# Patient Record
Sex: Female | Born: 1940
Health system: Southern US, Community
[De-identification: ages and names within clinical notes are randomized; demographics above are authoritative.]

## PROBLEM LIST (undated history)

## (undated) DIAGNOSIS — Z973 Presence of spectacles and contact lenses: Secondary | ICD-10-CM

## (undated) DIAGNOSIS — F332 Major depressive disorder, recurrent severe without psychotic features: Principal | ICD-10-CM

## (undated) DIAGNOSIS — C50919 Malignant neoplasm of unspecified site of unspecified female breast: Secondary | ICD-10-CM

## (undated) DIAGNOSIS — I10 Essential (primary) hypertension: Secondary | ICD-10-CM

## (undated) DIAGNOSIS — Z8619 Personal history of other infectious and parasitic diseases: Secondary | ICD-10-CM

## (undated) DIAGNOSIS — I4891 Unspecified atrial fibrillation: Secondary | ICD-10-CM

## (undated) DIAGNOSIS — C801 Malignant (primary) neoplasm, unspecified: Secondary | ICD-10-CM

## (undated) DIAGNOSIS — B9681 Helicobacter pylori [H. pylori] as the cause of diseases classified elsewhere: Secondary | ICD-10-CM

## (undated) DIAGNOSIS — Z923 Personal history of irradiation: Secondary | ICD-10-CM

## (undated) DIAGNOSIS — K2970 Gastritis, unspecified, without bleeding: Secondary | ICD-10-CM

## (undated) DIAGNOSIS — K118 Other diseases of salivary glands: Secondary | ICD-10-CM

## (undated) DIAGNOSIS — Z86010 Personal history of colonic polyps: Secondary | ICD-10-CM

## (undated) HISTORY — DX: Helicobacter pylori (H. pylori) as the cause of diseases classified elsewhere: B96.81

## (undated) HISTORY — DX: Personal history of other infectious and parasitic diseases: Z86.19

## (undated) HISTORY — DX: Gastritis, unspecified, without bleeding: K29.70

## (undated) HISTORY — DX: Essential (primary) hypertension: I10

## (undated) HISTORY — PX: EYE SURGERY: SHX253

## (undated) HISTORY — DX: Other diseases of salivary glands: K11.8

## (undated) HISTORY — PX: COLONOSCOPY: SHX174

## (undated) HISTORY — DX: Major depressive disorder, recurrent severe without psychotic features: F33.2

## (undated) HISTORY — DX: Personal history of colonic polyps: Z86.010

---

## 1980-12-07 HISTORY — PX: ABDOMINAL HYSTERECTOMY: SHX81

## 1998-06-27 ENCOUNTER — Ambulatory Visit (HOSPITAL_COMMUNITY): Admission: RE | Admit: 1998-06-27 | Discharge: 1998-06-27 | Payer: Self-pay | Admitting: Gastroenterology

## 1999-12-08 DIAGNOSIS — B9681 Helicobacter pylori [H. pylori] as the cause of diseases classified elsewhere: Secondary | ICD-10-CM

## 1999-12-08 HISTORY — DX: Helicobacter pylori (H. pylori) as the cause of diseases classified elsewhere: B96.81

## 1999-12-08 HISTORY — PX: ESOPHAGOGASTRODUODENOSCOPY: SHX1529

## 2000-04-30 ENCOUNTER — Encounter: Payer: Self-pay | Admitting: Gastroenterology

## 2000-04-30 ENCOUNTER — Encounter: Admission: RE | Admit: 2000-04-30 | Discharge: 2000-04-30 | Payer: Self-pay | Admitting: Gastroenterology

## 2000-05-10 ENCOUNTER — Encounter (INDEPENDENT_AMBULATORY_CARE_PROVIDER_SITE_OTHER): Payer: Self-pay | Admitting: Specialist

## 2000-05-10 ENCOUNTER — Ambulatory Visit (HOSPITAL_COMMUNITY): Admission: RE | Admit: 2000-05-10 | Discharge: 2000-05-10 | Payer: Self-pay | Admitting: Gastroenterology

## 2002-11-11 ENCOUNTER — Emergency Department (HOSPITAL_COMMUNITY): Admission: EM | Admit: 2002-11-11 | Discharge: 2002-11-11 | Payer: Self-pay

## 2002-11-11 ENCOUNTER — Encounter: Payer: Self-pay | Admitting: Emergency Medicine

## 2002-12-07 HISTORY — PX: BREAST SURGERY: SHX581

## 2003-07-05 ENCOUNTER — Encounter (HOSPITAL_BASED_OUTPATIENT_CLINIC_OR_DEPARTMENT_OTHER): Payer: Self-pay | Admitting: General Surgery

## 2003-07-05 ENCOUNTER — Ambulatory Visit (HOSPITAL_COMMUNITY): Admission: RE | Admit: 2003-07-05 | Discharge: 2003-07-05 | Payer: Self-pay | Admitting: General Surgery

## 2003-07-05 ENCOUNTER — Encounter: Admission: RE | Admit: 2003-07-05 | Discharge: 2003-07-05 | Payer: Self-pay | Admitting: General Surgery

## 2003-07-17 ENCOUNTER — Encounter (HOSPITAL_BASED_OUTPATIENT_CLINIC_OR_DEPARTMENT_OTHER): Payer: Self-pay | Admitting: General Surgery

## 2003-07-19 ENCOUNTER — Encounter (INDEPENDENT_AMBULATORY_CARE_PROVIDER_SITE_OTHER): Payer: Self-pay | Admitting: Specialist

## 2003-07-19 ENCOUNTER — Ambulatory Visit (HOSPITAL_COMMUNITY): Admission: RE | Admit: 2003-07-19 | Discharge: 2003-07-19 | Payer: Self-pay | Admitting: General Surgery

## 2003-12-08 HISTORY — PX: BREAST LUMPECTOMY WITH SENTINEL LYMPH NODE BIOPSY: SHX5597

## 2003-12-08 HISTORY — PX: RE-EXCISION OF BREAST LUMPECTOMY: SHX6048

## 2004-05-25 ENCOUNTER — Emergency Department (HOSPITAL_COMMUNITY): Admission: EM | Admit: 2004-05-25 | Discharge: 2004-05-25 | Payer: Self-pay | Admitting: Emergency Medicine

## 2004-09-01 ENCOUNTER — Ambulatory Visit (HOSPITAL_COMMUNITY): Payer: Self-pay | Admitting: Psychiatry

## 2004-09-08 ENCOUNTER — Ambulatory Visit (HOSPITAL_COMMUNITY): Payer: Self-pay | Admitting: Professional Counselor

## 2004-09-15 ENCOUNTER — Ambulatory Visit (HOSPITAL_COMMUNITY): Payer: Self-pay | Admitting: Psychiatry

## 2004-09-22 ENCOUNTER — Ambulatory Visit (HOSPITAL_COMMUNITY): Payer: Self-pay | Admitting: Professional Counselor

## 2004-10-10 ENCOUNTER — Ambulatory Visit (HOSPITAL_COMMUNITY): Payer: Self-pay | Admitting: Psychiatry

## 2004-10-13 ENCOUNTER — Encounter: Admission: RE | Admit: 2004-10-13 | Discharge: 2004-10-13 | Payer: Self-pay | Admitting: General Surgery

## 2004-10-13 ENCOUNTER — Encounter (INDEPENDENT_AMBULATORY_CARE_PROVIDER_SITE_OTHER): Payer: Self-pay | Admitting: *Deleted

## 2004-10-13 ENCOUNTER — Encounter (INDEPENDENT_AMBULATORY_CARE_PROVIDER_SITE_OTHER): Payer: Self-pay | Admitting: Diagnostic Radiology

## 2004-10-16 ENCOUNTER — Encounter: Admission: RE | Admit: 2004-10-16 | Discharge: 2004-10-16 | Payer: Self-pay | Admitting: General Surgery

## 2004-10-20 ENCOUNTER — Ambulatory Visit (HOSPITAL_COMMUNITY): Payer: Self-pay | Admitting: Professional Counselor

## 2004-10-21 ENCOUNTER — Encounter: Admission: RE | Admit: 2004-10-21 | Discharge: 2004-10-21 | Payer: Self-pay | Admitting: General Surgery

## 2004-11-06 ENCOUNTER — Encounter: Admission: RE | Admit: 2004-11-06 | Discharge: 2004-11-06 | Payer: Self-pay | Admitting: General Surgery

## 2004-11-12 ENCOUNTER — Ambulatory Visit (HOSPITAL_COMMUNITY): Admission: RE | Admit: 2004-11-12 | Discharge: 2004-11-12 | Payer: Self-pay | Admitting: General Surgery

## 2004-11-12 ENCOUNTER — Ambulatory Visit (HOSPITAL_BASED_OUTPATIENT_CLINIC_OR_DEPARTMENT_OTHER): Admission: RE | Admit: 2004-11-12 | Discharge: 2004-11-12 | Payer: Self-pay | Admitting: General Surgery

## 2004-11-12 ENCOUNTER — Encounter (INDEPENDENT_AMBULATORY_CARE_PROVIDER_SITE_OTHER): Payer: Self-pay | Admitting: Specialist

## 2004-11-12 ENCOUNTER — Encounter: Admission: RE | Admit: 2004-11-12 | Discharge: 2004-11-12 | Payer: Self-pay | Admitting: General Surgery

## 2004-12-05 ENCOUNTER — Encounter (INDEPENDENT_AMBULATORY_CARE_PROVIDER_SITE_OTHER): Payer: Self-pay | Admitting: Specialist

## 2004-12-05 ENCOUNTER — Ambulatory Visit (HOSPITAL_COMMUNITY): Admission: RE | Admit: 2004-12-05 | Discharge: 2004-12-05 | Payer: Self-pay | Admitting: General Surgery

## 2004-12-05 ENCOUNTER — Ambulatory Visit (HOSPITAL_BASED_OUTPATIENT_CLINIC_OR_DEPARTMENT_OTHER): Admission: RE | Admit: 2004-12-05 | Discharge: 2004-12-05 | Payer: Self-pay | Admitting: General Surgery

## 2004-12-07 DIAGNOSIS — C50919 Malignant neoplasm of unspecified site of unspecified female breast: Secondary | ICD-10-CM

## 2004-12-07 DIAGNOSIS — Z923 Personal history of irradiation: Secondary | ICD-10-CM

## 2004-12-07 HISTORY — DX: Malignant neoplasm of unspecified site of unspecified female breast: C50.919

## 2004-12-07 HISTORY — DX: Personal history of irradiation: Z92.3

## 2004-12-11 ENCOUNTER — Ambulatory Visit: Payer: Self-pay | Admitting: Oncology

## 2004-12-19 ENCOUNTER — Ambulatory Visit: Admission: RE | Admit: 2004-12-19 | Discharge: 2005-03-13 | Payer: Self-pay | Admitting: *Deleted

## 2005-02-16 ENCOUNTER — Ambulatory Visit: Payer: Self-pay | Admitting: Oncology

## 2005-02-18 ENCOUNTER — Ambulatory Visit (HOSPITAL_COMMUNITY): Payer: Self-pay | Admitting: Psychiatry

## 2005-02-27 ENCOUNTER — Ambulatory Visit (HOSPITAL_COMMUNITY): Admission: RE | Admit: 2005-02-27 | Discharge: 2005-02-27 | Payer: Self-pay | Admitting: Oncology

## 2005-03-18 ENCOUNTER — Ambulatory Visit (HOSPITAL_COMMUNITY): Payer: Self-pay | Admitting: Psychiatry

## 2005-03-23 ENCOUNTER — Ambulatory Visit (HOSPITAL_COMMUNITY): Admission: RE | Admit: 2005-03-23 | Discharge: 2005-03-23 | Payer: Self-pay | Admitting: Oncology

## 2005-03-24 ENCOUNTER — Ambulatory Visit: Admission: RE | Admit: 2005-03-24 | Discharge: 2005-03-24 | Payer: Self-pay | Admitting: *Deleted

## 2005-03-27 ENCOUNTER — Ambulatory Visit (HOSPITAL_COMMUNITY): Admission: RE | Admit: 2005-03-27 | Discharge: 2005-03-27 | Payer: Self-pay | Admitting: Family Medicine

## 2005-03-27 ENCOUNTER — Emergency Department (HOSPITAL_COMMUNITY): Admission: EM | Admit: 2005-03-27 | Discharge: 2005-03-27 | Payer: Self-pay | Admitting: Family Medicine

## 2005-04-06 ENCOUNTER — Ambulatory Visit: Payer: Self-pay | Admitting: Oncology

## 2005-05-20 ENCOUNTER — Ambulatory Visit (HOSPITAL_COMMUNITY): Payer: Self-pay | Admitting: Psychiatry

## 2005-08-12 ENCOUNTER — Ambulatory Visit (HOSPITAL_COMMUNITY): Payer: Self-pay | Admitting: Psychiatry

## 2005-09-11 ENCOUNTER — Ambulatory Visit: Payer: Self-pay | Admitting: Oncology

## 2005-10-14 ENCOUNTER — Ambulatory Visit (HOSPITAL_COMMUNITY): Payer: Self-pay | Admitting: Psychiatry

## 2005-12-07 HISTORY — PX: BREAST LUMPECTOMY: SHX2

## 2006-01-13 ENCOUNTER — Ambulatory Visit (HOSPITAL_COMMUNITY): Payer: Self-pay | Admitting: Psychiatry

## 2006-02-16 ENCOUNTER — Ambulatory Visit: Payer: Self-pay | Admitting: Oncology

## 2006-04-20 ENCOUNTER — Emergency Department (HOSPITAL_COMMUNITY): Admission: EM | Admit: 2006-04-20 | Discharge: 2006-04-20 | Payer: Self-pay | Admitting: Family Medicine

## 2006-05-06 ENCOUNTER — Emergency Department (HOSPITAL_COMMUNITY): Admission: EM | Admit: 2006-05-06 | Discharge: 2006-05-06 | Payer: Self-pay | Admitting: Emergency Medicine

## 2006-05-24 ENCOUNTER — Ambulatory Visit (HOSPITAL_COMMUNITY): Payer: Self-pay | Admitting: Psychiatry

## 2006-06-08 ENCOUNTER — Emergency Department (HOSPITAL_COMMUNITY): Admission: EM | Admit: 2006-06-08 | Discharge: 2006-06-08 | Payer: Self-pay | Admitting: Family Medicine

## 2006-06-15 ENCOUNTER — Encounter: Admission: RE | Admit: 2006-06-15 | Discharge: 2006-06-15 | Payer: Self-pay | Admitting: Oncology

## 2006-06-16 ENCOUNTER — Ambulatory Visit (HOSPITAL_COMMUNITY): Payer: Self-pay | Admitting: Psychiatry

## 2006-08-16 ENCOUNTER — Ambulatory Visit: Payer: Self-pay | Admitting: Oncology

## 2006-08-18 LAB — COMPREHENSIVE METABOLIC PANEL
Albumin: 4 g/dL (ref 3.5–5.2)
CO2: 27 mEq/L (ref 19–32)
Chloride: 107 mEq/L (ref 96–112)
Glucose, Bld: 111 mg/dL — ABNORMAL HIGH (ref 70–99)
Potassium: 3.6 mEq/L (ref 3.5–5.3)
Sodium: 142 mEq/L (ref 135–145)
Total Protein: 7 g/dL (ref 6.0–8.3)

## 2006-08-18 LAB — CBC WITH DIFFERENTIAL/PLATELET
Eosinophils Absolute: 0 10*3/uL (ref 0.0–0.5)
LYMPH%: 36.1 % (ref 14.0–48.0)
MONO#: 0.4 10*3/uL (ref 0.1–0.9)
NEUT#: 2.5 10*3/uL (ref 1.5–6.5)
Platelets: 305 10*3/uL (ref 145–400)
RBC: 4.29 10*6/uL (ref 3.70–5.32)
RDW: 13.3 % (ref 11.3–14.5)
WBC: 4.6 10*3/uL (ref 3.9–10.0)

## 2006-08-19 ENCOUNTER — Encounter: Admission: RE | Admit: 2006-08-19 | Discharge: 2006-08-19 | Payer: Self-pay | Admitting: Oncology

## 2006-09-10 ENCOUNTER — Ambulatory Visit (HOSPITAL_COMMUNITY): Payer: Self-pay | Admitting: Psychiatry

## 2006-12-29 ENCOUNTER — Ambulatory Visit (HOSPITAL_COMMUNITY): Payer: Self-pay | Admitting: Psychiatry

## 2007-02-09 ENCOUNTER — Emergency Department (HOSPITAL_COMMUNITY): Admission: EM | Admit: 2007-02-09 | Discharge: 2007-02-09 | Payer: Self-pay | Admitting: Emergency Medicine

## 2007-02-10 ENCOUNTER — Ambulatory Visit: Payer: Self-pay | Admitting: Oncology

## 2007-02-22 LAB — CBC WITH DIFFERENTIAL/PLATELET
Basophils Absolute: 0 10*3/uL (ref 0.0–0.1)
EOS%: 0.7 % (ref 0.0–7.0)
HCT: 37.6 % (ref 34.8–46.6)
HGB: 13 g/dL (ref 11.6–15.9)
LYMPH%: 34.6 % (ref 14.0–48.0)
MCH: 29.3 pg (ref 26.0–34.0)
MCV: 84.4 fL (ref 81.0–101.0)
MONO%: 8.9 % (ref 0.0–13.0)
NEUT%: 55.2 % (ref 39.6–76.8)
Platelets: 315 10*3/uL (ref 145–400)
RDW: 13.4 % (ref 11.3–14.5)

## 2007-02-22 LAB — COMPREHENSIVE METABOLIC PANEL
AST: 13 U/L (ref 0–37)
Alkaline Phosphatase: 76 U/L (ref 39–117)
BUN: 10 mg/dL (ref 6–23)
Creatinine, Ser: 0.85 mg/dL (ref 0.40–1.20)

## 2007-03-28 ENCOUNTER — Ambulatory Visit (HOSPITAL_COMMUNITY): Payer: Self-pay | Admitting: Psychiatry

## 2007-06-22 ENCOUNTER — Encounter: Admission: RE | Admit: 2007-06-22 | Discharge: 2007-06-22 | Payer: Self-pay | Admitting: Oncology

## 2007-07-06 ENCOUNTER — Ambulatory Visit (HOSPITAL_COMMUNITY): Payer: Self-pay | Admitting: Psychiatry

## 2007-08-03 ENCOUNTER — Ambulatory Visit (HOSPITAL_COMMUNITY): Payer: Self-pay | Admitting: Psychiatry

## 2007-08-19 ENCOUNTER — Ambulatory Visit: Payer: Self-pay | Admitting: Oncology

## 2007-09-15 LAB — CBC WITH DIFFERENTIAL/PLATELET
Basophils Absolute: 0.1 10*3/uL (ref 0.0–0.1)
EOS%: 0.9 % (ref 0.0–7.0)
Eosinophils Absolute: 0.1 10*3/uL (ref 0.0–0.5)
HCT: 37.2 % (ref 34.8–46.6)
HGB: 13.2 g/dL (ref 11.6–15.9)
MCH: 29.4 pg (ref 26.0–34.0)
MCV: 83.2 fL (ref 81.0–101.0)
MONO%: 6.4 % (ref 0.0–13.0)
NEUT#: 3.9 10*3/uL (ref 1.5–6.5)
NEUT%: 58.6 % (ref 39.6–76.8)
lymph#: 2.2 10*3/uL (ref 0.9–3.3)

## 2007-09-15 LAB — COMPREHENSIVE METABOLIC PANEL
AST: 14 U/L (ref 0–37)
Albumin: 4.1 g/dL (ref 3.5–5.2)
BUN: 10 mg/dL (ref 6–23)
Calcium: 9.6 mg/dL (ref 8.4–10.5)
Chloride: 107 mEq/L (ref 96–112)
Creatinine, Ser: 0.82 mg/dL (ref 0.40–1.20)
Glucose, Bld: 122 mg/dL — ABNORMAL HIGH (ref 70–99)

## 2007-10-24 ENCOUNTER — Ambulatory Visit (HOSPITAL_COMMUNITY): Payer: Self-pay | Admitting: Psychiatry

## 2008-01-06 ENCOUNTER — Ambulatory Visit (HOSPITAL_COMMUNITY): Payer: Self-pay | Admitting: Psychiatry

## 2008-03-05 ENCOUNTER — Ambulatory Visit: Payer: Self-pay | Admitting: Oncology

## 2008-03-13 LAB — CBC WITH DIFFERENTIAL/PLATELET
Basophils Absolute: 0 10*3/uL (ref 0.0–0.1)
EOS%: 1.5 % (ref 0.0–7.0)
HCT: 38.1 % (ref 34.8–46.6)
HGB: 13.4 g/dL (ref 11.6–15.9)
LYMPH%: 41.4 % (ref 14.0–48.0)
MCH: 29.2 pg (ref 26.0–34.0)
MCV: 83.3 fL (ref 81.0–101.0)
MONO%: 8.7 % (ref 0.0–13.0)
NEUT%: 48.3 % (ref 39.6–76.8)
Platelets: 339 10*3/uL (ref 145–400)
lymph#: 2.7 10*3/uL (ref 0.9–3.3)

## 2008-03-13 LAB — COMPREHENSIVE METABOLIC PANEL
AST: 12 U/L (ref 0–37)
BUN: 14 mg/dL (ref 6–23)
Calcium: 9.8 mg/dL (ref 8.4–10.5)
Chloride: 108 mEq/L (ref 96–112)
Creatinine, Ser: 0.79 mg/dL (ref 0.40–1.20)

## 2008-06-22 ENCOUNTER — Encounter: Admission: RE | Admit: 2008-06-22 | Discharge: 2008-06-22 | Payer: Self-pay | Admitting: Oncology

## 2008-08-21 ENCOUNTER — Encounter: Admission: RE | Admit: 2008-08-21 | Discharge: 2008-08-21 | Payer: Self-pay | Admitting: Oncology

## 2008-09-10 ENCOUNTER — Ambulatory Visit: Payer: Self-pay | Admitting: Oncology

## 2008-09-14 LAB — CBC WITH DIFFERENTIAL/PLATELET
BASO%: 0.4 % (ref 0.0–2.0)
Basophils Absolute: 0 10*3/uL (ref 0.0–0.1)
EOS%: 0.8 % (ref 0.0–7.0)
HCT: 36.5 % (ref 34.8–46.6)
HGB: 12.7 g/dL (ref 11.6–15.9)
LYMPH%: 38.9 % (ref 14.0–48.0)
MCH: 29.3 pg (ref 26.0–34.0)
MCHC: 34.8 g/dL (ref 32.0–36.0)
MCV: 84.3 fL (ref 81.0–101.0)
MONO%: 10.2 % (ref 0.0–13.0)
NEUT%: 49.7 % (ref 39.6–76.8)
Platelets: 288 10*3/uL (ref 145–400)

## 2008-09-17 LAB — COMPREHENSIVE METABOLIC PANEL
ALT: 8 U/L (ref 0–35)
AST: 15 U/L (ref 0–37)
BUN: 15 mg/dL (ref 6–23)
Creatinine, Ser: 0.99 mg/dL (ref 0.40–1.20)
Total Bilirubin: 0.4 mg/dL (ref 0.3–1.2)

## 2008-12-24 ENCOUNTER — Ambulatory Visit (HOSPITAL_COMMUNITY): Payer: Self-pay | Admitting: Psychiatry

## 2009-03-12 ENCOUNTER — Emergency Department (HOSPITAL_COMMUNITY): Admission: EM | Admit: 2009-03-12 | Discharge: 2009-03-12 | Payer: Self-pay | Admitting: Emergency Medicine

## 2009-03-12 ENCOUNTER — Ambulatory Visit: Payer: Self-pay | Admitting: Oncology

## 2009-03-21 LAB — CBC WITH DIFFERENTIAL/PLATELET
Basophils Absolute: 0.1 10*3/uL (ref 0.0–0.1)
Eosinophils Absolute: 0.1 10*3/uL (ref 0.0–0.5)
HCT: 38.8 % (ref 34.8–46.6)
HGB: 13 g/dL (ref 11.6–15.9)
LYMPH%: 37.3 % (ref 14.0–49.7)
MONO#: 0.3 10*3/uL (ref 0.1–0.9)
NEUT#: 2.4 10*3/uL (ref 1.5–6.5)
NEUT%: 52.6 % (ref 38.4–76.8)
Platelets: 265 10*3/uL (ref 145–400)
WBC: 4.6 10*3/uL (ref 3.9–10.3)
nRBC: 0 % (ref 0–0)

## 2009-03-22 LAB — VITAMIN D 25 HYDROXY (VIT D DEFICIENCY, FRACTURES): Vit D, 25-Hydroxy: 17 ng/mL — ABNORMAL LOW (ref 30–89)

## 2009-03-22 LAB — COMPREHENSIVE METABOLIC PANEL
ALT: <8 U/L (ref 0–35)
BUN: 14 mg/dL (ref 6–23)
CO2: 25 mEq/L (ref 19–32)
Calcium: 10 mg/dL (ref 8.4–10.5)
Chloride: 108 mEq/L (ref 96–112)
Creatinine, Ser: 0.89 mg/dL (ref 0.40–1.20)
Glucose, Bld: 125 mg/dL — ABNORMAL HIGH (ref 70–99)

## 2009-03-22 LAB — CANCER ANTIGEN 27.29: CA 27.29: 39 U/mL (ref 0–39)

## 2009-05-10 ENCOUNTER — Ambulatory Visit (HOSPITAL_COMMUNITY): Payer: Self-pay | Admitting: Psychiatry

## 2009-07-04 ENCOUNTER — Encounter: Admission: RE | Admit: 2009-07-04 | Discharge: 2009-07-04 | Payer: Self-pay | Admitting: Internal Medicine

## 2009-09-24 ENCOUNTER — Ambulatory Visit: Payer: Self-pay | Admitting: Oncology

## 2010-06-08 IMAGING — CR DG WRIST COMPLETE 3+V*L*
4 series · 4 of 4 positions shown · non-contrast
Comparison: None

CLINICAL DATA: Pain post fall

LEFT WRIST - COMPLETE 3+ VIEW

[view not recorded (1 of 4)]
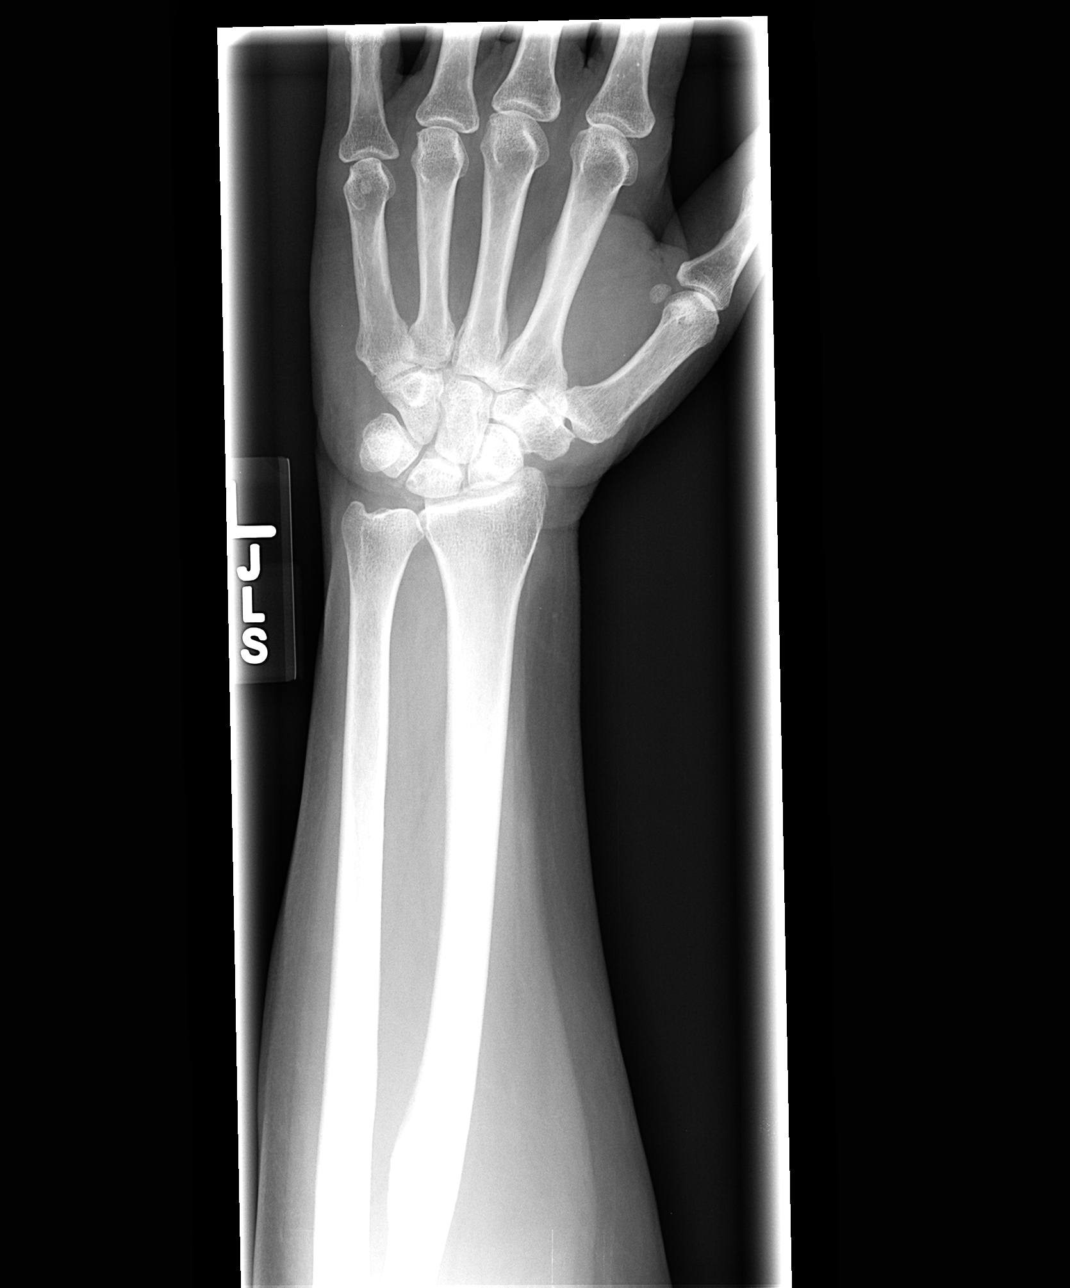

[view not recorded (2 of 4)]
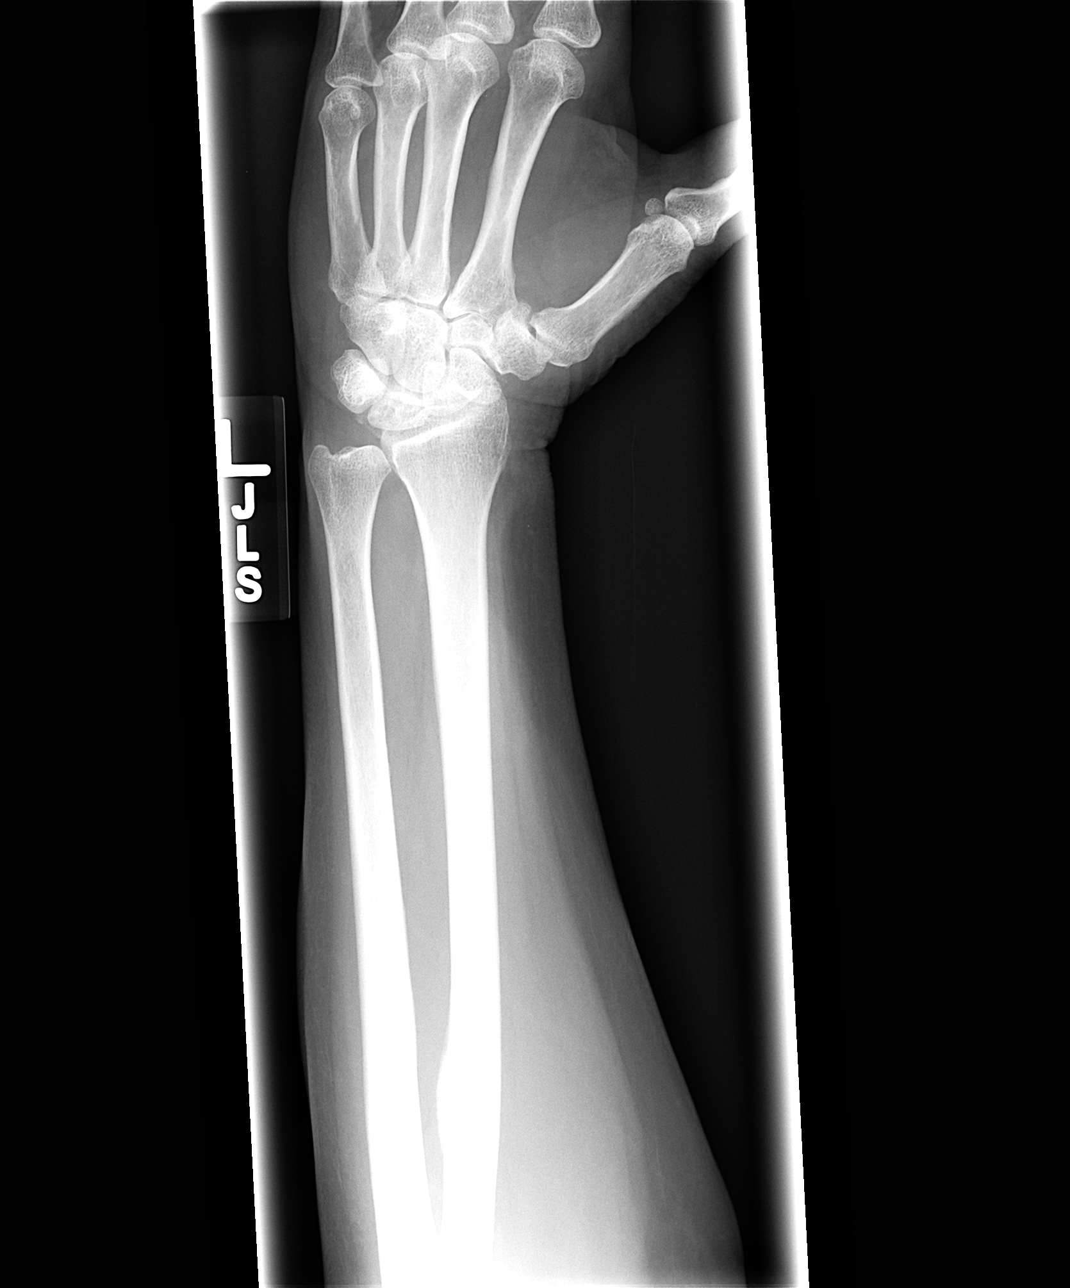

[view not recorded (3 of 4)]
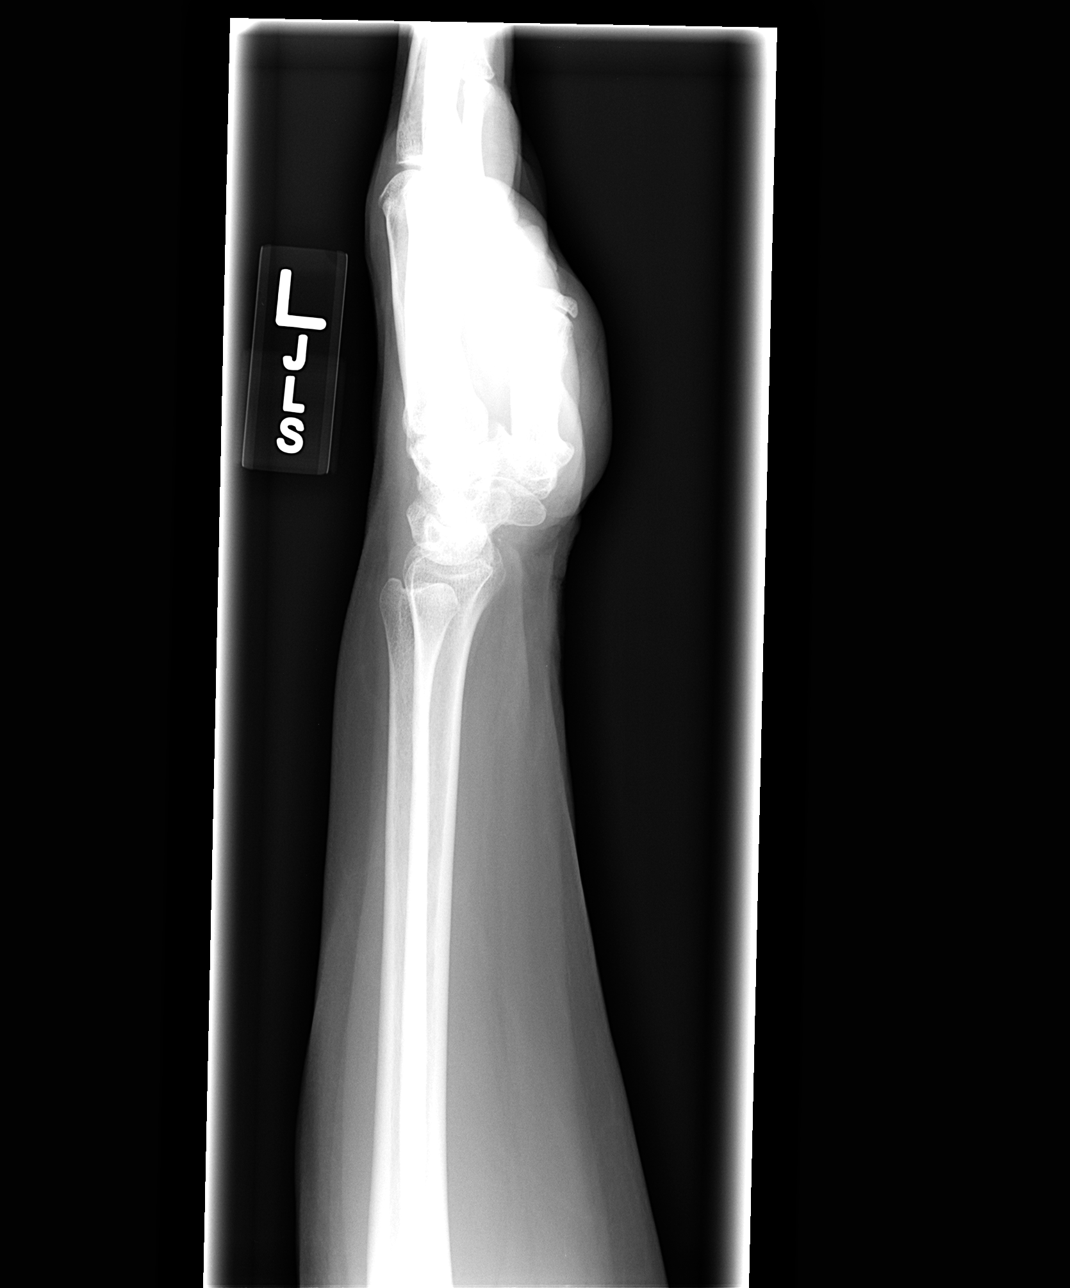

[view not recorded (4 of 4)]
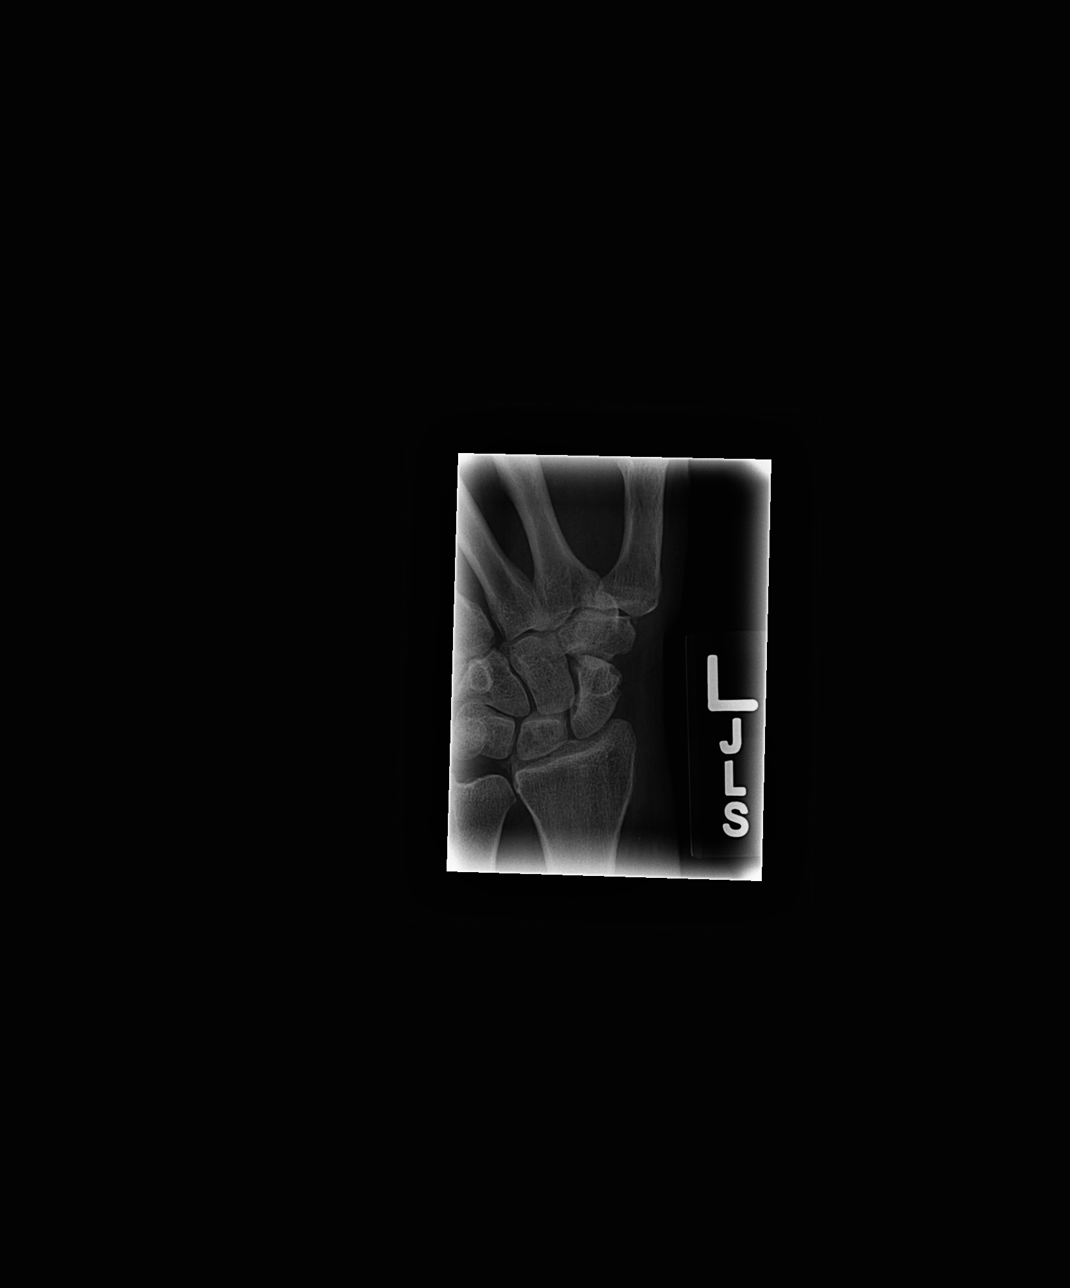

[4 of 4 positions shown; findings below may reference images not displayed]

FINDINGS: Chondrocalcinosis is noted the radiocarpal compartment.
Carpal rows intact.  Small degenerative cysts or geodes in the
lunate.  Negative for fracture, dislocation, or other acute bony
abnormality.
IMPRESSION: 1.  Negative for fracture or other acute abnormality.
2.  Chondrocalcinosis suggesting CPPD.

## 2010-07-07 ENCOUNTER — Encounter: Admission: RE | Admit: 2010-07-07 | Discharge: 2010-07-07 | Payer: Self-pay | Admitting: Internal Medicine

## 2010-07-16 ENCOUNTER — Ambulatory Visit (HOSPITAL_COMMUNITY): Payer: Self-pay | Admitting: Psychiatry

## 2010-07-17 ENCOUNTER — Ambulatory Visit (HOSPITAL_COMMUNITY): Payer: Self-pay | Admitting: Licensed Clinical Social Worker

## 2010-08-21 ENCOUNTER — Ambulatory Visit (HOSPITAL_COMMUNITY): Payer: Self-pay | Admitting: Licensed Clinical Social Worker

## 2010-09-24 ENCOUNTER — Emergency Department (HOSPITAL_COMMUNITY): Admission: EM | Admit: 2010-09-24 | Discharge: 2010-09-24 | Payer: Self-pay | Admitting: Family Medicine

## 2010-10-10 ENCOUNTER — Ambulatory Visit (HOSPITAL_COMMUNITY): Payer: Self-pay | Admitting: Licensed Clinical Social Worker

## 2010-10-17 ENCOUNTER — Ambulatory Visit (HOSPITAL_COMMUNITY): Payer: Self-pay | Admitting: Licensed Clinical Social Worker

## 2010-10-20 ENCOUNTER — Ambulatory Visit (HOSPITAL_COMMUNITY): Payer: Self-pay | Admitting: Licensed Clinical Social Worker

## 2010-10-28 ENCOUNTER — Ambulatory Visit (HOSPITAL_COMMUNITY): Payer: Self-pay | Admitting: Licensed Clinical Social Worker

## 2010-11-03 ENCOUNTER — Ambulatory Visit (HOSPITAL_COMMUNITY): Payer: Self-pay | Admitting: Psychiatry

## 2010-11-12 ENCOUNTER — Ambulatory Visit (HOSPITAL_COMMUNITY): Payer: Self-pay | Admitting: Licensed Clinical Social Worker

## 2010-12-27 ENCOUNTER — Encounter (HOSPITAL_BASED_OUTPATIENT_CLINIC_OR_DEPARTMENT_OTHER): Payer: Self-pay | Admitting: General Surgery

## 2010-12-28 ENCOUNTER — Encounter: Payer: Self-pay | Admitting: Oncology

## 2010-12-29 ENCOUNTER — Encounter: Payer: Self-pay | Admitting: Oncology

## 2011-04-24 NOTE — Op Note (Signed)
[name], Erin Cunningham                ACCOUNT NO.:  [id number]   MEDICAL RECORD NO.:  [id number]          PATIENT TYPE:  AMB   LOCATION:  DSC                          FACILITY:  [facility]   PHYSICIAN:  Leonie Man, M.D.   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   PROCEDURE:  Reexcision of right breast ductal carcinoma in situ.   SURGEON:  Leonie Man, M.D.   ASSISTANT:  Nurse.   ANESTHESIA:  General.   NOTE:  The patient is a 70 year old woman with the diagnosed DCIS who  underwent excisional biopsy/partial mastectomy of the right breast on  11/12/2004 for her DCIS.  The pathology shows that the tumor extends to the  anterior margin.  She returns now for reexcision of this area.  The risks  and potential benefits of surgery have been discussed with the patient and  her spouse. All questions answered and consent obtained.   PROCEDURE:  Following the induction of induction of general anesthesia the  patient is positioned supinely, and the right breast is prepped and draped  to be included in a sterile operative field.  An elliptical incision is made  over the old scar cicatrix and flaps were extended anteriorly and laterally  carrying the dissection down to include the entire anterior, superior, and  inferior margins of the excision.  The posterior portion of the tumor cavity  was down to muscle and that was not reexcised.  This specimen was removed  and forwarded for pathologic evaluation.  Pathology is pending.   Hemostasis was then assured with electrocautery.  All areas of dissection  checked for hemostasis and then noted to be dry.  Sponge, instruments,  and  sharp counts verified.  Subcutaneous tissue is reapproximated using  interrupted 3-0 Vicryl sutures.  The skin is closed with a running 4-0  Monocryl suture and then reenforced with Steri-Strips.  I injected  approximately 30 mL of air into the cavity to maintain the contour of the  breast.  I also injected 17 mL of  Marcaine with epinephrine for additional analgesia.  Sterile dressing was  then placed on the wound.  The anesthetic reversed.  The patient removed  from the operating room to the recovery room in stable condition.  She  tolerated the procedure well.      Patr   PB/MEDQ  D:  12/05/2004  T:  12/05/2004  Job:  [id number]   cc:   Leonie Man, M.D.  [address]  [address]  [place]  [place] [address]  Fax: [phone]

## 2011-04-24 NOTE — Op Note (Signed)
   NAME:  Erin Cunningham, Erin Cunningham                          ACCOUNT NO.:  [id number]   MEDICAL RECORD NO.:  [id number]                   PATIENT TYPE:  AMB   LOCATION:  DAY                                  FACILITY:  [facility]   PHYSICIAN:  Leonie Man, M.D.                DATE OF BIRTH:  07-Nov-1941   DATE OF PROCEDURE:  07/19/2003  DATE OF DISCHARGE:                                 OPERATIVE REPORT   PREOPERATIVE DIAGNOSIS:  Right breast mass, rule out carcinoma.   POSTOPERATIVE DIAGNOSIS:  Right breast mass, rule out carcinoma, pathology  pending.   PROCEDURE:  Excisional biopsy of right breast mass.   SURGEON:  Mardene Celeste. Lurene Shadow, M.D.   ASSISTANT:  Nurse.   ANESTHESIA:  General.   NOTE:  This patient is a 70 year old woman, who presents with an enlarging  mass in the right subareolar region, located at the upper inner quadrant of  the areolar which on mammogram is BIRADS 4 showing a suspicion lesion to be  a papilloma.  The patient comes to the operating room now for excision of  this mass.  The risks and potential benefits of surgery have been fully  discussed, all questions answered, and consent obtained.   DESCRIPTION OF PROCEDURE:  Following the induction of satisfactory general  anesthesia, the patient was positioned supinely, and the right breast is  prepped and draped to be included in a sterile operative field.  A  circumareolar incision is carried down on the upper inner border of the  right breast, that is deepened through the skin and subcutaneous tissue,  carrying the dissection under the nipple area to the region of the mass.  The mass is dissected free and removed in its entirety, and it is forwarded  for pathology evaluation.  Hemostasis obtained with electrocautery, and  sponge and instrument counts are verified.  Breast tissues are then  reapproximated with interrupted 3-0 Vicryl sutures as is the subcutaneous  fat.  The skin is closed with a running 5-0 Monocryl  suture and then  reinforced with Steri-Strips.  Sterile compressive dressing is applied, the  anesthetic reversed, and the patient removed from the operating room to the  recovery room in stable condition.  She tolerated the procedure well.                                               Leonie Man, M.D.    PB/MEDQ  D:  07/19/2003  T:  07/19/2003  Job:  [id number]   cc:   Dr. Lurene Shadow (2 copies)

## 2011-04-24 NOTE — Op Note (Signed)
Erin Cunningham, Erin Cunningham                ACCOUNT NO.:  [id number]   MEDICAL RECORD NO.:  [id number]          PATIENT TYPE:  AMB   LOCATION:  DSC                          FACILITY:  [facility]   PHYSICIAN:  Leonie Man, M.D.   DATE OF BIRTH:  1941/09/14   DATE OF PROCEDURE:  11/12/2004  DATE OF DISCHARGE:                                 OPERATIVE REPORT   PREOPERATIVE DIAGNOSIS:  Ductal carcinoma in situ, right breast.   POSTOPERATIVE DIAGNOSIS:  Ductal carcinoma in situ, right breast, pathology  pending.   PROCEDURE:  Right breast lumpectomy following needle localization.   SURGEON:  Leonie Man, M.D.   ASSISTANT:  Nurse.   ANESTHESIA:  General anesthesia.   NOTE:  [name] is a 70 year old woman, on screening mammogram noted to  have an area of microcalcifications in the right breast at approximately the  6 o'clock axis.  This was core biopsied and noted to have DCIS.  MRIs  actually showed that she had an additional lesion, but this was not seen on  ultrasound.  On repeat MRI the lesion had cleaned up and thought then to be  a hematoma from her previous biopsy site.  The patient comes to the  operating room now for a lumpectomy on this breast.  She understands that  invasive carcinoma may be found and that she would require additional  surgery for this.  She has undergone needle localization under ultrasonic  guidance.  The risks and potential benefits of surgery have been fully  discussed with her, all questions answered, and consent obtained.   PROCEDURE:  Following the induction of satisfactory general anesthesia, the  patient is positioned supinely and the right breast is prepped and draped to  be included in the sterile operative field.  An elliptical incision is made  around the localizing needle and dissection carried down into the large  mass, raising flaps superiorly, inferiorly, and laterally, so as to get a  large wedge of breast tissue.  This was taken down all  the way to the chest  wall.  The entire mass was removed and forwarded for radiologic evaluation.  Specimen radiography shows that the mass was entirely contained within the  specimen.  Hemostasis was then obtained with electrocautery.  Sponge and  instrument counts were verified.  The subcutaneous tissue was closed with a  running 2-0 Vicryl suture, skin was closed with a running 4-0 Monocryl  suture and then reinforced with Steri-Strips, sterile dressings applied,  anesthetic reversed, the patient removed from the operating room to the  recovery room in stable condition.  She tolerated the procedure well.      Patr   PB/MEDQ  D:  11/12/2004  T:  11/13/2004  Job:  [id number]

## 2011-06-15 ENCOUNTER — Other Ambulatory Visit: Payer: Self-pay | Admitting: Internal Medicine

## 2011-06-15 DIAGNOSIS — Z853 Personal history of malignant neoplasm of breast: Secondary | ICD-10-CM

## 2011-06-15 DIAGNOSIS — Z9889 Other specified postprocedural states: Secondary | ICD-10-CM

## 2011-07-17 ENCOUNTER — Ambulatory Visit
Admission: RE | Admit: 2011-07-17 | Discharge: 2011-07-17 | Disposition: A | Discharge status: Home / Self Care | Payer: 59 | Source: Ambulatory Visit | Attending: Internal Medicine | Admitting: Internal Medicine

## 2011-07-17 DIAGNOSIS — Z853 Personal history of malignant neoplasm of breast: Secondary | ICD-10-CM

## 2011-07-17 DIAGNOSIS — Z9889 Other specified postprocedural states: Secondary | ICD-10-CM

## 2012-05-04 ENCOUNTER — Telehealth: Payer: Self-pay | Admitting: Oncology

## 2012-05-04 NOTE — Telephone Encounter (Signed)
05/04/12 - Spoke with Ms. Erin Cunningham on the phone for a follow-up visit for the NSABP B-35 study.  She states she is doing wonderful. No problems. No hospitalizations, no fractures or broken bones, no new medications. She has her yearly mammograms and sees her primary care MD yearly or as needed.

## 2012-06-14 ENCOUNTER — Other Ambulatory Visit: Payer: Self-pay | Admitting: Internal Medicine

## 2012-06-14 DIAGNOSIS — Z853 Personal history of malignant neoplasm of breast: Secondary | ICD-10-CM

## 2012-06-14 DIAGNOSIS — Z9889 Other specified postprocedural states: Secondary | ICD-10-CM

## 2012-08-25 ENCOUNTER — Ambulatory Visit
Admission: RE | Admit: 2012-08-25 | Discharge: 2012-08-25 | Disposition: A | Discharge status: Home / Self Care | Payer: Medicare Other | Source: Ambulatory Visit | Attending: Internal Medicine | Admitting: Internal Medicine

## 2012-08-25 ENCOUNTER — Other Ambulatory Visit: Payer: Self-pay | Admitting: Internal Medicine

## 2012-08-25 DIAGNOSIS — Z853 Personal history of malignant neoplasm of breast: Secondary | ICD-10-CM

## 2012-08-25 DIAGNOSIS — Z9889 Other specified postprocedural states: Secondary | ICD-10-CM

## 2012-12-07 DIAGNOSIS — K118 Other diseases of salivary glands: Secondary | ICD-10-CM

## 2012-12-07 HISTORY — DX: Other diseases of salivary glands: K11.8

## 2013-03-30 ENCOUNTER — Emergency Department (HOSPITAL_COMMUNITY): Payer: Medicare Other

## 2013-03-30 ENCOUNTER — Emergency Department (HOSPITAL_COMMUNITY)
Admission: EM | Admit: 2013-03-30 | Discharge: 2013-03-30 | Disposition: A | Discharge status: Home / Self Care | Payer: Medicare Other | Attending: Emergency Medicine | Admitting: Emergency Medicine

## 2013-03-30 ENCOUNTER — Encounter (HOSPITAL_COMMUNITY): Payer: Self-pay | Admitting: Emergency Medicine

## 2013-03-30 DIAGNOSIS — M25579 Pain in unspecified ankle and joints of unspecified foot: Secondary | ICD-10-CM | POA: Insufficient documentation

## 2013-03-30 DIAGNOSIS — M79671 Pain in right foot: Secondary | ICD-10-CM

## 2013-03-30 DIAGNOSIS — Z853 Personal history of malignant neoplasm of breast: Secondary | ICD-10-CM | POA: Insufficient documentation

## 2013-03-30 HISTORY — DX: Malignant neoplasm of unspecified site of unspecified female breast: C50.919

## 2013-03-30 HISTORY — DX: Malignant (primary) neoplasm, unspecified: C80.1

## 2013-03-30 MED ORDER — HYDROCODONE-ACETAMINOPHEN 5-325 MG PO TABS: 1.0000 | ORAL_TABLET | Freq: Four times a day (QID) | ORAL | Status: DC | PRN

## 2013-03-30 MED ORDER — NAPROXEN 500 MG PO TABS: 500.0000 mg | ORAL_TABLET | Freq: Two times a day (BID) | ORAL | Status: DC

## 2013-03-30 NOTE — ED Provider Notes (Signed)
History     CSN: [id number]  Arrival date & time 03/30/13  7829   First MD Initiated Contact with Patient 03/30/13 [phone]      Chief Complaint  Patient presents with  . Foot Pain    (Consider location/radiation/quality/duration/timing/severity/associated sxs/prior treatment) Patient is a 72 y.o. female presenting with lower extremity pain. The history is provided by the patient (pt states she has right foot pain for weeks). No language interpreter was used.  Foot Pain This is a new problem. The current episode started more than 1 week ago. The problem occurs constantly. The problem has not changed since onset.Pertinent negatives include no chest pain, no abdominal pain and no headaches. Nothing aggravates the symptoms. Nothing relieves the symptoms.    Past Medical History  Diagnosis Date  . Cancer   . Breast CA     Past Surgical History  Procedure Laterality Date  . Breast surgery    . Eye surgery      No family history on file.  History  Substance Use Topics  . Smoking status: Never Smoker   . Smokeless tobacco: Not on file  . Alcohol Use: No    OB History   Grav Para Term Preterm Abortions TAB SAB Ect Mult Living                  Review of Systems  Constitutional: Negative for appetite change and fatigue.  HENT: Negative for congestion, sinus pressure and ear discharge.   Eyes: Negative for discharge.  Respiratory: Negative for cough.   Cardiovascular: Negative for chest pain.  Gastrointestinal: Negative for abdominal pain and diarrhea.  Genitourinary: Negative for frequency and hematuria.  Musculoskeletal: Negative for back pain.       Right foot pain  Skin: Negative for rash.  Neurological: Negative for seizures and headaches.  Psychiatric/Behavioral: Negative for hallucinations.    Allergies  Review of patient's allergies indicates no known allergies.  Home Medications   Current Outpatient Rx  Name  Route  Sig  Dispense  Refill  .  HYDROcodone-acetaminophen (NORCO/VICODIN) 5-325 MG per tablet   Oral   Take 1 tablet by mouth every 6 (six) hours as needed for pain.   20 tablet   0   . naproxen (NAPROSYN) 500 MG tablet   Oral   Take 1 tablet (500 mg total) by mouth 2 (two) times daily.   30 tablet   0     BP 164/87  Pulse 63  Temp(Src) 97.8 F (36.6 C) (Oral)  Resp 20  SpO2 97%  Physical Exam  Constitutional: She is oriented to person, place, and time. She appears well-developed.  HENT:  Head: Normocephalic.  Eyes: Conjunctivae are normal.  Neck: No tracheal deviation present.  Cardiovascular:  No murmur heard. Musculoskeletal: Normal range of motion.  Tender swollen right heel  Neurological: She is oriented to person, place, and time.  Skin: Skin is warm.  Psychiatric: She has a normal mood and affect.    ED Course  Procedures (including critical care time)  Labs Reviewed - No data to display Dg Foot Complete Right  03/30/2013  *RADIOLOGY REPORT*  Clinical Data: Pain  RIGHT FOOT COMPLETE - 3+ VIEW  Comparison: None.  Findings:  Frontal, oblique, and lateral views were obtained. There is a prominent spur arising from the inferior calcaneus.  There is evidence of previous surgery in the fifth PIP region.  There is mild narrowing of all other PIP and DIP joints as well as the  first MTP joint.  There is no acute fracture or dislocation.  Impression:  Multilevel osteoarthritic change.  Prominent spur inferior calcaneus.  No acute fracture or dislocation.   Original Report Authenticated By: Bretta Bang, M.D.      1. Foot pain, right       MDM          Benny Lennert, MD 03/30/13 [phone]

## 2013-03-30 NOTE — ED Notes (Signed)
Pt here for c/o heel/foot pain she stated that she thinks it was her shoes she wore on Easter Sunday pain 9/10 unable to bear wt

## 2013-08-30 ENCOUNTER — Other Ambulatory Visit: Payer: Self-pay

## 2013-08-30 ENCOUNTER — Encounter (HOSPITAL_BASED_OUTPATIENT_CLINIC_OR_DEPARTMENT_OTHER): Payer: Self-pay | Admitting: *Deleted

## 2013-08-30 DIAGNOSIS — Z1231 Encounter for screening mammogram for malignant neoplasm of breast: Secondary | ICD-10-CM

## 2013-08-30 DIAGNOSIS — Z9889 Other specified postprocedural states: Secondary | ICD-10-CM

## 2013-08-30 DIAGNOSIS — Z853 Personal history of malignant neoplasm of breast: Secondary | ICD-10-CM

## 2013-08-30 NOTE — Progress Notes (Signed)
No meds-no pcp-no labs needed Bring overnight bag=all preop instructions reviewed and pt has no questions

## 2013-09-04 ENCOUNTER — Ambulatory Visit: Payer: Medicare Other

## 2013-09-04 NOTE — H&P (Signed)
PREOPERATIVE H&P  Chief Complaint: right cheek mass  HPI: Erin Cunningham is a 72 y.o. female who presents for evaluation of right cheek mass just in front of the right ear that she's noticed for the past month or two. It measures approximately 2 cm in the mid portion of the parotid gland and on clinical exam is consistent with a parotid neoplasm. It's firm and mobile and she has normal facial nerve function. She's taken to the OR for right superficial parotidectomy.  Past Medical History  Diagnosis Date  . Cancer   . Breast CA   . Wears glasses    Past Surgical History  Procedure Laterality Date  . Breast surgery  2004    rt br mass  . Eye surgery      both cataracts  . Breast lumpectomy with sentinel lymph node biopsy  2005    right  . Re-excision of breast lumpectomy  2005    right  . Abdominal hysterectomy  1982    BSO  . Cesarean section    . Colonoscopy     History   Social History  . Marital Status: Married    Spouse Name: N/A    Number of Children: N/A  . Years of Education: N/A   Social History Main Topics  . Smoking status: Never Smoker   . Smokeless tobacco: None  . Alcohol Use: Yes     Comment: occ  . Drug Use: No  . Sexual Activity: Not Currently   Other Topics Concern  . None   Social History Narrative  . None   History reviewed. No pertinent family history. No Known Allergies Prior to Admission medications   Not on File     Positive ROS: negative  All other systems have been reviewed and were otherwise negative with the exception of those mentioned in the HPI and as above.  Physical Exam: There were no vitals filed for this visit.  General: Alert, no acute distress Oral: Normal oral mucosa and tonsils Nasal: Clear nasal passages Neck: No palpable adenopathy or thyroid nodules. 2 cm firm nodule in front of the right ear and tragus Ear: Ear canal is clear with normal appearing TMs Cardiovascular: Regular rate and rhythm, no murmur.   Respiratory: Clear to auscultation Neurologic: Alert and oriented x 3. Normal facial nerve function   Assessment/Plan: PAROTID NEOPLASM Plan for Procedure(s): RIGHT SUPERFICIAL PAROTIDECTOMY WITH FACIAL NERVE DISECTION   Dillard Cannon, MD 09/04/2013 12:57 PM

## 2013-09-05 ENCOUNTER — Ambulatory Visit (HOSPITAL_BASED_OUTPATIENT_CLINIC_OR_DEPARTMENT_OTHER)
Admission: RE | Admit: 2013-09-05 | Discharge: 2013-09-06 | Disposition: A | Discharge status: Home / Self Care | Payer: Medicare Other | Source: Ambulatory Visit | Attending: Otolaryngology | Admitting: Otolaryngology

## 2013-09-05 ENCOUNTER — Encounter (HOSPITAL_BASED_OUTPATIENT_CLINIC_OR_DEPARTMENT_OTHER): Payer: Self-pay | Admitting: *Deleted

## 2013-09-05 ENCOUNTER — Encounter (HOSPITAL_BASED_OUTPATIENT_CLINIC_OR_DEPARTMENT_OTHER): Payer: Self-pay | Admitting: Anesthesiology

## 2013-09-05 ENCOUNTER — Encounter (HOSPITAL_BASED_OUTPATIENT_CLINIC_OR_DEPARTMENT_OTHER)
Admission: RE | Disposition: A | Discharge status: Home / Self Care | Payer: Self-pay | Source: Ambulatory Visit | Attending: Otolaryngology

## 2013-09-05 ENCOUNTER — Ambulatory Visit (HOSPITAL_BASED_OUTPATIENT_CLINIC_OR_DEPARTMENT_OTHER): Payer: Medicare Other | Admitting: Anesthesiology

## 2013-09-05 DIAGNOSIS — K116 Mucocele of salivary gland: Secondary | ICD-10-CM | POA: Insufficient documentation

## 2013-09-05 DIAGNOSIS — Z853 Personal history of malignant neoplasm of breast: Secondary | ICD-10-CM | POA: Insufficient documentation

## 2013-09-05 HISTORY — PX: PAROTIDECTOMY: SHX2163

## 2013-09-05 HISTORY — DX: Presence of spectacles and contact lenses: Z97.3

## 2013-09-05 SURGERY — EXCISION, PAROTID GLAND
Anesthesia: General | Laterality: Right | Wound class: Clean

## 2013-09-05 MED ORDER — OXYCODONE HCL 5 MG PO TABS: 5.0000 mg | ORAL_TABLET | Freq: Once | ORAL | Status: AC | PRN

## 2013-09-05 MED ORDER — EPHEDRINE SULFATE 50 MG/ML IJ SOLN
INTRAMUSCULAR | Status: DC | PRN
  Administered 2013-09-05: 10 mg via INTRAVENOUS

## 2013-09-05 MED ORDER — ONDANSETRON HCL 4 MG PO TABS: 4.0000 mg | ORAL_TABLET | ORAL | Status: DC | PRN

## 2013-09-05 MED ORDER — BACITRACIN ZINC 500 UNIT/GM EX OINT
1.0000 "application " | TOPICAL_OINTMENT | Freq: Three times a day (TID) | CUTANEOUS | Status: DC
  Administered 2013-09-05 – 2013-09-06 (×2): 1 via TOPICAL

## 2013-09-05 MED ORDER — LIDOCAINE-EPINEPHRINE 1 %-1:100000 IJ SOLN
INTRAMUSCULAR | Status: DC | PRN
  Administered 2013-09-05: 5 mL

## 2013-09-05 MED ORDER — BACITRACIN ZINC 500 UNIT/GM EX OINT
TOPICAL_OINTMENT | CUTANEOUS | Status: DC | PRN
  Administered 2013-09-05: 1 via TOPICAL

## 2013-09-05 MED ORDER — ACETAMINOPHEN 650 MG RE SUPP: 650.0000 mg | RECTAL | Status: DC | PRN

## 2013-09-05 MED ORDER — KCL IN DEXTROSE-NACL 20-5-0.45 MEQ/L-%-% IV SOLN
INTRAVENOUS | Status: DC
  Administered 2013-09-05: 75 mL/h via INTRAVENOUS

## 2013-09-05 MED ORDER — SUCCINYLCHOLINE CHLORIDE 20 MG/ML IJ SOLN
INTRAMUSCULAR | Status: DC | PRN
  Administered 2013-09-05: 100 mg via INTRAVENOUS

## 2013-09-05 MED ORDER — FENTANYL CITRATE 0.05 MG/ML IJ SOLN
25.0000 ug | INTRAMUSCULAR | Status: DC | PRN
  Administered 2013-09-05: 25 ug via INTRAVENOUS

## 2013-09-05 MED ORDER — MORPHINE SULFATE 2 MG/ML IJ SOLN: 2.0000 mg | INTRAMUSCULAR | Status: DC | PRN

## 2013-09-05 MED ORDER — LIDOCAINE HCL (CARDIAC) 20 MG/ML IV SOLN
INTRAVENOUS | Status: DC | PRN
  Administered 2013-09-05: 75 mg via INTRAVENOUS

## 2013-09-05 MED ORDER — HYDROCODONE-ACETAMINOPHEN 5-325 MG PO TABS
1.0000 | ORAL_TABLET | ORAL | Status: DC | PRN
  Administered 2013-09-05 (×2): 2 via ORAL

## 2013-09-05 MED ORDER — PROMETHAZINE HCL 25 MG/ML IJ SOLN
6.2500 mg | INTRAMUSCULAR | Status: DC | PRN
  Administered 2013-09-05: 6.25 mg via INTRAVENOUS

## 2013-09-05 MED ORDER — ONDANSETRON HCL 4 MG/2ML IJ SOLN: 4.0000 mg | Freq: Four times a day (QID) | INTRAMUSCULAR | Status: DC | PRN

## 2013-09-05 MED ORDER — SUFENTANIL CITRATE 50 MCG/ML IV SOLN
INTRAVENOUS | Status: DC | PRN
  Administered 2013-09-05 (×3): 5 ug via INTRAVENOUS
  Administered 2013-09-05: 15 ug via INTRAVENOUS

## 2013-09-05 MED ORDER — DEXAMETHASONE SODIUM PHOSPHATE 4 MG/ML IJ SOLN
INTRAMUSCULAR | Status: DC | PRN
  Administered 2013-09-05: 10 mg via INTRAVENOUS

## 2013-09-05 MED ORDER — ACETAMINOPHEN 160 MG/5ML PO SOLN: 650.0000 mg | ORAL | Status: DC | PRN

## 2013-09-05 MED ORDER — LACTATED RINGERS IV SOLN
INTRAVENOUS | Status: DC
  Administered 2013-09-05 (×2): via INTRAVENOUS

## 2013-09-05 MED ORDER — OXYCODONE HCL 5 MG/5ML PO SOLN: 5.0000 mg | Freq: Once | ORAL | Status: AC | PRN

## 2013-09-05 MED ORDER — ONDANSETRON HCL 4 MG/2ML IJ SOLN: 4.0000 mg | INTRAMUSCULAR | Status: DC | PRN

## 2013-09-05 MED ORDER — FENTANYL CITRATE 0.05 MG/ML IJ SOLN: 50.0000 ug | INTRAMUSCULAR | Status: DC | PRN

## 2013-09-05 MED ORDER — CEFAZOLIN SODIUM-DEXTROSE 2-3 GM-% IV SOLR
INTRAVENOUS | Status: DC | PRN
  Administered 2013-09-05: 2 g via INTRAVENOUS

## 2013-09-05 MED ORDER — PROPOFOL 10 MG/ML IV BOLUS
INTRAVENOUS | Status: DC | PRN
  Administered 2013-09-05: 150 mg via INTRAVENOUS

## 2013-09-05 MED ORDER — ZOLPIDEM TARTRATE 5 MG PO TABS: 5.0000 mg | ORAL_TABLET | Freq: Every evening | ORAL | Status: DC | PRN

## 2013-09-05 MED ORDER — MIDAZOLAM HCL 5 MG/5ML IJ SOLN
INTRAMUSCULAR | Status: DC | PRN
  Administered 2013-09-05: 2 mg via INTRAVENOUS

## 2013-09-05 MED ORDER — MIDAZOLAM HCL 2 MG/2ML IJ SOLN: 1.0000 mg | INTRAMUSCULAR | Status: DC | PRN

## 2013-09-05 MED ORDER — CEFAZOLIN SODIUM 1-5 GM-% IV SOLN
1.0000 g | Freq: Three times a day (TID) | INTRAVENOUS | Status: DC
  Administered 2013-09-05 (×2): 1 g via INTRAVENOUS

## 2013-09-05 SURGICAL SUPPLY — 80 items
ADH SKN CLS APL DERMABOND .7 (GAUZE/BANDAGES/DRESSINGS)
APL SKNCLS STERI-STRIP NONHPOA (GAUZE/BANDAGES/DRESSINGS)
APPLICATOR COTTON TIP 6IN STRL (MISCELLANEOUS) ×2 IMPLANT
ATTRACTOMAT 16X20 MAGNETIC DRP (DRAPES) ×1 IMPLANT
BALL CTTN LRG ABS STRL LF (GAUZE/BANDAGES/DRESSINGS) ×1
BENZOIN TINCTURE PRP APPL 2/3 (GAUZE/BANDAGES/DRESSINGS) IMPLANT
BLADE SURG 12 STRL SS (BLADE) ×2 IMPLANT
BLADE SURG 15 STRL LF DISP TIS (BLADE) ×1 IMPLANT
BLADE SURG 15 STRL SS (BLADE) ×2
CANISTER SUCTION 1200CC (MISCELLANEOUS) ×2 IMPLANT
CLEANER CAUTERY TIP 5X5 PAD (MISCELLANEOUS) IMPLANT
CLOTH BEACON ORANGE TIMEOUT ST (SAFETY) ×2 IMPLANT
CORDS BIPOLAR (ELECTRODE) ×2 IMPLANT
COTTONBALL LRG STERILE PKG (GAUZE/BANDAGES/DRESSINGS) ×2 IMPLANT
COVER MAYO STAND STRL (DRAPES) ×2 IMPLANT
COVER TABLE BACK 60X90 (DRAPES) ×2 IMPLANT
DECANTER SPIKE VIAL GLASS SM (MISCELLANEOUS) ×1 IMPLANT
DERMABOND ADVANCED (GAUZE/BANDAGES/DRESSINGS)
DERMABOND ADVANCED .7 DNX12 (GAUZE/BANDAGES/DRESSINGS) IMPLANT
DRAIN CHANNEL 7F FF FLAT (WOUND CARE) IMPLANT
DRAIN JACKSON RD 7FR 3/32 (WOUND CARE) IMPLANT
DRAIN JP 10F RND SILICONE (MISCELLANEOUS) ×1 IMPLANT
DRAIN PENROSE 1/4X12 LTX STRL (WOUND CARE) IMPLANT
DRAIN SNY WOU 7FLT (WOUND CARE) IMPLANT
DRAPE SURG 17X23 STRL (DRAPES) ×2 IMPLANT
DRAPE U-SHAPE 76X120 STRL (DRAPES) ×2 IMPLANT
ELECT COATED BLADE 2.86 ST (ELECTRODE) ×2 IMPLANT
ELECT REM PT RETURN 9FT ADLT (ELECTROSURGICAL) ×2
ELECTRODE REM PT RTRN 9FT ADLT (ELECTROSURGICAL) ×1 IMPLANT
EVACUATOR SILICONE 100CC (DRAIN) ×1 IMPLANT
GAUZE SPONGE 4X4 12PLY STRL LF (GAUZE/BANDAGES/DRESSINGS) IMPLANT
GAUZE SPONGE 4X4 16PLY XRAY LF (GAUZE/BANDAGES/DRESSINGS) ×1 IMPLANT
GLOVE BIOGEL PI IND STRL 7.0 (GLOVE) IMPLANT
GLOVE BIOGEL PI IND STRL 8 (GLOVE) IMPLANT
GLOVE BIOGEL PI INDICATOR 7.0 (GLOVE) ×2
GLOVE BIOGEL PI INDICATOR 8 (GLOVE) ×1
GLOVE ECLIPSE 7.0 STRL STRAW (GLOVE) ×4 IMPLANT
GLOVE SKINSENSE NS SZ7.5 (GLOVE) ×1
GLOVE SKINSENSE STRL SZ7.5 (GLOVE) IMPLANT
GLOVE SS BIOGEL STRL SZ 7.5 (GLOVE) ×1 IMPLANT
GLOVE SUPERSENSE BIOGEL SZ 7.5 (GLOVE) ×1
GLOVE SURG SS PI 8.0 STRL IVOR (GLOVE) ×1 IMPLANT
GOWN PREVENTION PLUS XLARGE (GOWN DISPOSABLE) ×4 IMPLANT
GOWN PREVENTION PLUS XXLARGE (GOWN DISPOSABLE) ×4 IMPLANT
HEMOSTAT SNOW SURGICEL 2X4 (HEMOSTASIS) ×1 IMPLANT
LOCATOR NERVE 3 VOLT (DISPOSABLE) IMPLANT
NDL HYPO 25X1 1.5 SAFETY (NEEDLE) ×1 IMPLANT
NEEDLE HYPO 25X1 1.5 SAFETY (NEEDLE) ×2 IMPLANT
NS IRRIG 1000ML POUR BTL (IV SOLUTION) ×2 IMPLANT
PACK BASIN DAY SURGERY FS (CUSTOM PROCEDURE TRAY) ×2 IMPLANT
PAD CLEANER CAUTERY TIP 5X5 (MISCELLANEOUS) ×1
PENCIL BUTTON HOLSTER BLD 10FT (ELECTRODE) ×2 IMPLANT
PIN SAFETY STERILE (MISCELLANEOUS) ×1 IMPLANT
SHEET MEDIUM DRAPE 40X70 STRL (DRAPES) IMPLANT
SLEEVE SCD COMPRESS KNEE MED (MISCELLANEOUS) ×2 IMPLANT
SPONGE INTESTINAL PEANUT (DISPOSABLE) ×2 IMPLANT
STAPLER VISISTAT 35W (STAPLE) IMPLANT
STRIP CLOSURE SKIN 1/2X4 (GAUZE/BANDAGES/DRESSINGS) IMPLANT
SUCTION FRAZIER TIP 10 FR DISP (SUCTIONS) IMPLANT
SUT CHROMIC 3 0 PS 2 (SUTURE) ×2 IMPLANT
SUT CHROMIC 3 0 TIES (SUTURE) IMPLANT
SUT ETHILON 3 0 PS 1 (SUTURE) ×2 IMPLANT
SUT ETHILON 4 0 PS 2 18 (SUTURE) IMPLANT
SUT ETHILON 5 0 P 3 18 (SUTURE)
SUT NYLON ETHILON 5-0 P-3 1X18 (SUTURE) IMPLANT
SUT SILK 2 0 FS (SUTURE) ×2 IMPLANT
SUT SILK 2 0 TIES 17X18 (SUTURE) ×2
SUT SILK 2-0 18XBRD TIE BLK (SUTURE) ×1 IMPLANT
SUT SILK 3 0 PS 1 (SUTURE) IMPLANT
SUT SILK 3 0 SH 30 (SUTURE) IMPLANT
SUT SILK 3 0 TIES 17X18 (SUTURE)
SUT SILK 3-0 18XBRD TIE BLK (SUTURE) IMPLANT
SUT SILK 4 0 TIES 17X18 (SUTURE) ×2 IMPLANT
SWAB COLLECTION DEVICE MRSA (MISCELLANEOUS) IMPLANT
SYR BULB 3OZ (MISCELLANEOUS) ×2 IMPLANT
SYR CONTROL 10ML LL (SYRINGE) ×2 IMPLANT
TOWEL OR 17X24 6PK STRL BLUE (TOWEL DISPOSABLE) ×4 IMPLANT
TRAY DSU PREP LF (CUSTOM PROCEDURE TRAY) ×2 IMPLANT
TUBE ANAEROBIC SPECIMEN COL (MISCELLANEOUS) IMPLANT
TUBE CONNECTING 20X1/4 (TUBING) ×2 IMPLANT

## 2013-09-05 NOTE — Interval H&P Note (Signed)
History and Physical Interval Note:  09/05/2013 7:30 AM  Erin Cunningham  has presented today for surgery, with the diagnosis of PEROTID NEOPLASM  The various methods of treatment have been discussed with the patient and family. After consideration of risks, benefits and other options for treatment, the patient has consented to  Procedure(s): RIGHT SUPERFICIAL PAROTIDECTOMY WITH FACIAL NERVE DISECTION (Right) as a surgical intervention .  The patient's history has been reviewed, patient examined, no change in status, stable for surgery.  I have reviewed the patient's chart and labs.  Questions were answered to the patient's satisfaction.     Eriverto Byrnes

## 2013-09-05 NOTE — Anesthesia Postprocedure Evaluation (Signed)
Anesthesia Post Note  Patient: Erin Cunningham  Procedure(s) Performed: Procedure(s) (LRB): RIGHT SUPERFICIAL PAROTIDECTOMY WITH FACIAL NERVE DISECTION (Right)  Anesthesia type: General  Patient location: PACU  Post pain: Pain level controlled and Adequate analgesia  Post assessment: Post-op Vital signs reviewed, Patient's Cardiovascular Status Stable, Respiratory Function Stable, Patent Airway and Pain level controlled  Last Vitals:  Filed Vitals:   09/05/13 1150  BP: 176/96  Pulse: 92  Temp: 36.4 C  Resp: 18    Post vital signs: Reviewed and stable  Level of consciousness: awake, alert  and oriented  Complications: No apparent anesthesia complications

## 2013-09-05 NOTE — Anesthesia Procedure Notes (Signed)
Procedure Name: Intubation Date/Time: 09/05/2013 7:44 AM Performed by: Zenia Resides D Pre-anesthesia Checklist: Patient identified, Emergency Drugs available, Suction available and Patient being monitored Patient Re-evaluated:Patient Re-evaluated prior to inductionOxygen Delivery Method: Circle System Utilized Preoxygenation: Pre-oxygenation with 100% oxygen Intubation Type: IV induction and Cricoid Pressure applied Ventilation: Mask ventilation without difficulty Laryngoscope Size: Mac and 3 Grade View: Grade II Tube type: Oral Tube size: 7.0 mm Number of attempts: 2 Airway Equipment and Method: stylet and oral airway Placement Confirmation: ETT inserted through vocal cords under direct vision,  positive ETCO2 and breath sounds checked- equal and bilateral Secured at: 22 cm Tube secured with: Tape Dental Injury: Teeth and Oropharynx as per pre-operative assessment

## 2013-09-05 NOTE — Progress Notes (Signed)
Post Op check AF VSS Minimal pain No Swelling JP output serous minimal Normal facial nerve function Plan to remove JP and discharge in AM

## 2013-09-05 NOTE — Anesthesia Preprocedure Evaluation (Signed)
Anesthesia Evaluation  Patient identified by MRN, date of birth, ID band Patient awake    Reviewed: Allergy & Precautions, H&P , NPO status , Patient's Chart, lab work & pertinent test results  Airway Mallampati: II  Neck ROM: full    Dental   Pulmonary neg pulmonary ROS,          Cardiovascular negative cardio ROS      Neuro/Psych    GI/Hepatic   Endo/Other  Parotid neoplasm  Renal/GU      Musculoskeletal   Abdominal   Peds  Hematology   Anesthesia Other Findings   Reproductive/Obstetrics H/o breast CA                           Anesthesia Physical Anesthesia Plan  ASA: II  Anesthesia Plan: General   Post-op Pain Management:    Induction: Intravenous  Airway Management Planned: Oral ETT  Additional Equipment:   Intra-op Plan:   Post-operative Plan: Extubation in OR  Informed Consent: I have reviewed the patients History and Physical, chart, labs and discussed the procedure including the risks, benefits and alternatives for the proposed anesthesia with the patient or authorized representative who has indicated his/her understanding and acceptance.     Plan Discussed with: CRNA, Anesthesiologist and Surgeon  Anesthesia Plan Comments:         Anesthesia Quick Evaluation

## 2013-09-05 NOTE — Brief Op Note (Signed)
09/05/2013  10:28 AM  PATIENT:  Erin Cunningham  72 y.o. female  PRE-OPERATIVE DIAGNOSIS:  PAROTID NEOPLASM-RIGHT  POST-OPERATIVE DIAGNOSIS:  PAROTID CYST--RIGHT  PROCEDURE:  Procedure(s): RIGHT SUPERFICIAL PAROTIDECTOMY WITH FACIAL NERVE DISECTION (Right)  SURGEON:  Surgeon(s) and Role:    * Drema Halon, MD - Primary    * Keturah Barre, MD - Assisting  PHYSICIAN ASSISTANT:   ASSISTANTS: Hermelinda Medicus   ANESTHESIA:   general  EBL:  Total I/O In: 1000 [I.V.:1000] Out: -   BLOOD ADMINISTERED:none  DRAINS: (10) Jackson-Pratt drain(s) with closed bulb suction in the right parotid bed   LOCAL MEDICATIONS USED:  LIDOCAINE   SPECIMEN:  Source of Specimen:  right parotid cyst  DISPOSITION OF SPECIMEN:  PATHOLOGY  COUNTS:  YES  TOURNIQUET:  * No tourniquets in log *  DICTATION: .Other Dictation: Dictation Number [id number]  PLAN OF CARE: Admit for overnight observation  PATIENT DISPOSITION:  PACU - hemodynamically stable.   Delay start of Pharmacological VTE agent (>24hrs) due to surgical blood loss or risk of bleeding: yes

## 2013-09-05 NOTE — Transfer of Care (Signed)
Immediate Anesthesia Transfer of Care Note  Patient: Erin Cunningham  Procedure(s) Performed: Procedure(s): RIGHT SUPERFICIAL PAROTIDECTOMY WITH FACIAL NERVE DISECTION (Right)  Patient Location: PACU  Anesthesia Type:General  Level of Consciousness: awake and alert   Airway & Oxygen Therapy: Patient Spontanous Breathing and Patient connected to face mask oxygen  Post-op Assessment: Report given to PACU RN and Post -op Vital signs reviewed and stable  Post vital signs: Reviewed and stable  Complications: No apparent anesthesia complications

## 2013-09-06 ENCOUNTER — Encounter (HOSPITAL_BASED_OUTPATIENT_CLINIC_OR_DEPARTMENT_OTHER): Payer: Self-pay | Admitting: Otolaryngology

## 2013-09-06 NOTE — Op Note (Signed)
NAME:  Huizar, Glendy                ACCOUNT NO.:  [id number]  MEDICAL RECORD NO.:  [id number]  LOCATION:  WA17                         FACILITY:  [facility]  PHYSICIAN:  Christopher E. Newman, M.D.DATE OF BIRTH:  09/24/1941  DATE OF PROCEDURE:  09/05/2013 DATE OF DISCHARGE:  09/06/2013                              OPERATIVE REPORT   PREOPERATIVE DIAGNOSIS:  Right parotid mass.  POSTOPERATIVE DIAGNOSIS:  Right parotid cyst.  OPERATION:  Right superficial parotidectomy with excision of parotid mass and facial nerve dissection.  SURGEON:  Christopher E. Newman, M.D.  ASSISTANT SURGEON:  James Crossley, M.D.  ANESTHESIA:  General endotracheal.  COMPLICATIONS:  None.  BRIEF CLINICAL NOTE:  Erin Cunningham is a 72-year-old female, who has noticed a nodule in front of her right ear now for about 2 months.  On exam, she has an approximate 2-cm nodule adjacent from the tragus, which is mobile.  On palpation, it is consistent with the parotid mass, a possible parotid neoplasm.  She was taken to the operating room at this time for right superficial parotidectomy with facial nerve dissection and removal of parotid mass.  DESCRIPTION OF THE PROCEDURE:  After adequate endotracheal anesthesia, the right neck was prepped with Betadine solution, draped out with sterile towels.  The area of the parotid incision was injected with Xylocaine with epinephrine for hemostasis.  A standard parotid incision was then made and subplatysmal flaps were elevated above the parotid fascia anteriorly to expose the parotid mass, which was located in the mid superior portion of the parotid gland.  The tail of the parotid gland was unaffected.  Dissection was then carried down along the tragus, down toward the styloid process where the trunk of the facial nerve was identified in its normal anatomical position.  Dissection was carried out along the facial nerve trunk towards the vision.  The mass was located mostly  over the upper branches of the facial nerve.  Few of the lower branches were dissected out.  The mid branch of the facial nerve was dissected out.  This was basically the inferior aspect of the parotid mass.  All the upper branches of the facial nerve were dissected out and the mass was directly overlying the upper branches of the facial nerve.  There is also some generous sized veins around the mass as well as overlying the nerve, which were ligated with 3-0 silk, and 4-0 silk sutures.  On removing the mass, the mass was entered and cleared, serous fluid was aspirated from the mass.  The entire mass was removed along with some surrounding normal parotid tissue.  The mass was sent to Pathology in formalin for pathology review.  Hemostasis was obtained with bipolar cautery and 3-0 and 4-0 silk ligatures.  After obtaining adequate hemostasis, there is little bit of bleeding around some of the upper branches and some Surgicel was placed superiorly for hemostasis. A #10 JP drain, carotid drain was brought out through a separate stab incision posteriorly and placed in the parotid bed.  The incision was closed with interrupted 3-0 chromic sutures subcutaneously and 5-0 nylon to reapproximate the skin edges.  Dressing was applied.  Bacitracin ointment was applied.  The   patient was awoke from anesthesia and transferred to the recovery room, postop doing well.  DISPOSITION:  Leota will be observed overnight very carefully and discharged home in the morning on Tylenol and Vicodin p.r.n. pain.  We will have her follow up office in 6 days for recheck to review pathology and have sutures removed.          ______________________________ Christopher E. Newman, M.D.     CEN/MEDQ  D:  09/05/2013  T:  09/06/2013  Job:  [id number]  cc:   James Crossley, M.D. 

## 2013-09-06 NOTE — Op Note (Deleted)
NAMESANDRIKA, Erin Cunningham                ACCOUNT NO.:  [id number]  MEDICAL RECORD NO.:  [id number]  LOCATION:  WA17                         FACILITY:  [facility]  PHYSICIAN:  Kristine Garbe. Ezzard Standing, M.D.DATE OF BIRTH:  May 22, 1941  DATE OF PROCEDURE:  09/05/2013 DATE OF DISCHARGE:  09/06/2013                              OPERATIVE REPORT   PREOPERATIVE DIAGNOSIS:  Right parotid mass.  POSTOPERATIVE DIAGNOSIS:  Right parotid cyst.  OPERATION:  Right superficial parotidectomy with excision of parotid mass and facial nerve dissection.  SURGEON:  Kristine Garbe. Ezzard Standing, M.D.  ASSISTANT SURGEON:  Hermelinda Medicus, M.D.  ANESTHESIA:  General endotracheal.  COMPLICATIONS:  None.  BRIEF CLINICAL NOTE:  Erin Cunningham is a 72 year old female, who has noticed a nodule in front of her right ear now for about 2 months.  On exam, she has an approximate 2-cm nodule adjacent from the tragus, which is mobile.  On palpation, it is consistent with the parotid mass, a possible parotid neoplasm.  She was taken to the operating room at this time for right superficial parotidectomy with facial nerve dissection and removal of parotid mass.  DESCRIPTION OF THE PROCEDURE:  After adequate endotracheal anesthesia, the right neck was prepped with Betadine solution, draped out with sterile towels.  The area of the parotid incision was injected with Xylocaine with epinephrine for hemostasis.  A standard parotid incision was then made and subplatysmal flaps were elevated above the parotid fascia anteriorly to expose the parotid mass, which was located in the mid superior portion of the parotid gland.  The tail of the parotid gland was unaffected.  Dissection was then carried down along the tragus, down toward the styloid process where the trunk of the facial nerve was identified in its normal anatomical position.  Dissection was carried out along the facial nerve trunk towards the vision.  The mass was located mostly  over the upper branches of the facial nerve.  Few of the lower branches were dissected out.  The mid branch of the facial nerve was dissected out.  This was basically the inferior aspect of the parotid mass.  All the upper branches of the facial nerve were dissected out and the mass was directly overlying the upper branches of the facial nerve.  There is also some generous sized veins around the mass as well as overlying the nerve, which were ligated with 3-0 silk, and 4-0 silk sutures.  On removing the mass, the mass was entered and cleared, serous fluid was aspirated from the mass.  The entire mass was removed along with some surrounding normal parotid tissue.  The mass was sent to Pathology in formalin for pathology review.  Hemostasis was obtained with bipolar cautery and 3-0 and 4-0 silk ligatures.  After obtaining adequate hemostasis, there is little bit of bleeding around some of the upper branches and some Surgicel was placed superiorly for hemostasis. A #10 JP drain, carotid drain was brought out through a separate stab incision posteriorly and placed in the parotid bed.  The incision was closed with interrupted 3-0 chromic sutures subcutaneously and 5-0 nylon to reapproximate the skin edges.  Dressing was applied.  Bacitracin ointment was applied.  The  patient was awoke from anesthesia and transferred to the recovery room, postop doing well.  DISPOSITION:  Erin Cunningham will be observed overnight very carefully and discharged home in the morning on Tylenol and Vicodin p.r.n. pain.  We will have her follow up office in 6 days for recheck to review pathology and have sutures removed.          ______________________________ Kristine Garbe Ezzard Standing, M.D.     CEN/MEDQ  D:  09/05/2013  T:  09/06/2013  Job:  [id number]  cc:   Hermelinda Medicus, M.D.

## 2013-09-25 ENCOUNTER — Ambulatory Visit
Admission: RE | Admit: 2013-09-25 | Discharge: 2013-09-25 | Disposition: A | Discharge status: Home / Self Care | Payer: Medicare Other | Source: Ambulatory Visit

## 2013-09-25 DIAGNOSIS — Z1231 Encounter for screening mammogram for malignant neoplasm of breast: Secondary | ICD-10-CM

## 2013-09-25 DIAGNOSIS — Z853 Personal history of malignant neoplasm of breast: Secondary | ICD-10-CM

## 2013-09-25 DIAGNOSIS — Z9889 Other specified postprocedural states: Secondary | ICD-10-CM

## 2014-06-12 ENCOUNTER — Ambulatory Visit (INDEPENDENT_AMBULATORY_CARE_PROVIDER_SITE_OTHER): Payer: Medicare Other | Admitting: Podiatry

## 2014-06-12 ENCOUNTER — Encounter: Payer: Self-pay | Admitting: Podiatry

## 2014-06-12 VITALS — BP 153/110 | HR 62 | Ht 63.0 in | Wt 163.0 lb

## 2014-06-12 DIAGNOSIS — M79609 Pain in unspecified limb: Secondary | ICD-10-CM

## 2014-06-12 DIAGNOSIS — B351 Tinea unguium: Secondary | ICD-10-CM | POA: Insufficient documentation

## 2014-06-12 DIAGNOSIS — M79606 Pain in leg, unspecified: Secondary | ICD-10-CM | POA: Insufficient documentation

## 2014-06-12 NOTE — Progress Notes (Signed)
Subjective: 73 year old female presents requesting toe nails trimmed. Left great toe nail is splitting in half, right great toe nail is very sensitive, and all other nails are thick and discolored.  Objective: Dermatologic: Thick dystrophic nails x 10 with fungal debris under nail plate. Longitudinal splitting left hallucal nail, narrow thick right hallucal nail, all others are very thick with dark brown and yellow debris under nail plate. Vascular: All pedal pulses are palpable. No edema or erythema noted. Neurologic: All epicritic and tactile sensations are grossly intact.  Orthopedic: Mild bunion bilateral.  Assessment:  Painful feet. Onychomycosis x 10.  Plan: All nails debrided. Return in 3 months or as needed.

## 2014-06-12 NOTE — Patient Instructions (Signed)
Seen for hypertrophic nails. All nails debrided. Return in 3 months or as needed.  

## 2014-09-12 ENCOUNTER — Ambulatory Visit (INDEPENDENT_AMBULATORY_CARE_PROVIDER_SITE_OTHER): Payer: Medicare Other | Admitting: Podiatry

## 2014-09-12 ENCOUNTER — Encounter: Payer: Self-pay | Admitting: Podiatry

## 2014-09-12 VITALS — BP 167/88 | HR 62 | Ht 63.0 in | Wt 162.0 lb

## 2014-09-12 DIAGNOSIS — M79606 Pain in leg, unspecified: Secondary | ICD-10-CM

## 2014-09-12 DIAGNOSIS — B351 Tinea unguium: Secondary | ICD-10-CM

## 2014-09-12 NOTE — Progress Notes (Signed)
Subjective:  73 year old female presents requesting toe nails trimmed. Left great toe nail hurts from  splitting in half, right great toe nail is very sensitive, and all other nails are thick and discolored.   Objective: Dermatologic: Thick dystrophic nails x 10 with fungal debris under nail plate.  All nails are very thick with dark brown and yellow debris under nail plate.  Vascular: All pedal pulses are palpable. No edema or erythema noted.  Neurologic: All epicritic and tactile sensations are grossly intact.  Orthopedic: Mild bunion bilateral.   Assessment:  Painful feet.  Onychomycosis x 10.   Plan: All nails debrided.  Return in 3 months or as needed

## 2014-09-12 NOTE — Patient Instructions (Signed)
Seen for hypertrophic nails. All nails debrided. Return in 3 months or as needed.  

## 2014-11-28 ENCOUNTER — Ambulatory Visit: Payer: Self-pay | Admitting: Internal Medicine

## 2014-12-14 ENCOUNTER — Ambulatory Visit: Payer: Medicare Other | Admitting: Podiatry

## 2014-12-28 ENCOUNTER — Ambulatory Visit: Payer: Self-pay | Admitting: Internal Medicine

## 2015-01-16 ENCOUNTER — Ambulatory Visit (INDEPENDENT_AMBULATORY_CARE_PROVIDER_SITE_OTHER): Payer: Medicare Other | Admitting: Internal Medicine

## 2015-01-16 ENCOUNTER — Encounter: Payer: Self-pay | Admitting: Internal Medicine

## 2015-01-16 VITALS — BP 142/86 | HR 66 | Temp 98.2°F | Resp 20 | Ht 62.21 in | Wt 167.0 lb

## 2015-01-16 DIAGNOSIS — Z1239 Encounter for other screening for malignant neoplasm of breast: Secondary | ICD-10-CM

## 2015-01-16 DIAGNOSIS — Z23 Encounter for immunization: Secondary | ICD-10-CM

## 2015-01-16 DIAGNOSIS — Z853 Personal history of malignant neoplasm of breast: Secondary | ICD-10-CM

## 2015-01-16 DIAGNOSIS — R030 Elevated blood-pressure reading, without diagnosis of hypertension: Secondary | ICD-10-CM | POA: Diagnosis not present

## 2015-01-16 NOTE — Progress Notes (Signed)
Patient ID: Erin Cunningham, female   DOB: 04-16-1941, 74 y.o.   MRN: [id number]    Facility  [facility]    Place of Service:   OFFICE   No Known Allergies  Chief Complaint  Patient presents with  . Establish Care    HPI:  74 yo female seen today as a new patient. She has taken BP meds in the past but took herself off as she thought it was ok to stop. She had pork skins prior to her office visit. Denies HA, dizziness, CP, SOB or palpitations. Has frequent hot flashes.   She has hx right breast CA and s/p lumpectomy in 2006. No chemotx but had XRT. Took 5 yrs off tamoxifen. She was released from oncologist care but gets annual mammograms.   She sees Dr Caffie Pinto podiatry for mx onychomycosis.  She has 2 adult boys and 1 adult girl, 8 grandchildren and 1 great grandchild. Currently retired but remains active in the community. She is a Consulting civil engineer at [employer].  Active Ambulatory Problems    Diagnosis Date Noted  . Onychomycosis 06/12/2014  . Pain in lower limb 06/12/2014   Resolved Ambulatory Problems    Diagnosis Date Noted  . No Resolved Ambulatory Problems   Past Medical History  Diagnosis Date  . Wears glasses   . Cancer   . Breast CA 2006  . Parotid mass 2014   Past Surgical History  Procedure Laterality Date  . Breast surgery  2004    rt br mass  . Eye surgery      both cataracts  . Breast lumpectomy with sentinel lymph node biopsy  2005    right  . Re-excision of breast lumpectomy  2005    right  . Abdominal hysterectomy  1982    BSO  . Cesarean section    . Colonoscopy    . Parotidectomy Right 09/05/2013    Procedure: RIGHT SUPERFICIAL PAROTIDECTOMY WITH FACIAL NERVE DISECTION;  Surgeon: [name], MD;  Location: [facility];  Service: ENT;  Laterality: Right;  All documentation done by R.Ward after [phone] --done under G. Garzon's password   Family History  Problem Relation Age of Onset  . Heart disease Mother     MI  .  Cancer Father     bone  She has 9 living siblings History   Social History  . Marital Status: Married    Spouse Name: N/A  . Number of Children: 3  . 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   Social History Main Topics  . Smoking status: Never Smoker   . Smokeless tobacco: Yes- chewed tobacco in past  . Alcohol Use: Yes     Comment: occ  . Drug Use: No  . Sexual Activity: Not Currently   Other Topics Concern  . Not on file   Social History Narrative  Exercises by water aerobics and walking. 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  Medications: Patient's Medications   No medications on file     Review of Systems  As above. All other systems reviewed are negative  Filed Vitals:   01/16/15 1344 01/16/15 1543  BP: 170/88 142/86  Pulse: 66   Temp: 98.2 F (36.8 C)   TempSrc: Oral   Resp: 20   Height: 5' 2.21" (1.58 m)   Weight: 167 lb (75.751 kg)   SpO2: 97%    Body mass index is 30.34  kg/(m^2).  Physical Exam CONSTITUTIONAL: Looks well in NAD. Awake, alert and oriented x 3 HEENT: PERRLA. Oropharynx clear and without exudate NECK: Supple. Nontender. No palpable cervical or supraclavicular lymph nodes. No carotid bruit b/l. No thyromegaly or thyroid mass palpable.  CVS: Regular rate without murmur, gallop or rub. LUNGS: CTA b/l no wheezing, rales or rhonchi. ABDOMEN: Bowel sounds present x 4. Soft, nontender, nondistended. No palpable mass or bruit EXTREMITIES: No edema b/l. Distal pulses palpable. No calf tenderness PSYCH: Affect, behavior and mood normal   Labs reviewed: No visits with results within 3 Month(s) from this visit.  Past medical, surgical, family and social history reviewed    Assessment/Plan    ICD-9-CM ICD-10-CM   1. Elevated blood pressure (not hypertension) 796.2 R03.0 CMP     Lipid Panel     CBC with Differential     TSH     Urinalysis with Reflex Microscopic  2. History of  breast cancer in female V10.3 Z85.3   3. Screening for breast cancer V76.10 Z12.39 MM Digital Screening   --recommend low salt diet and avoid pork products as they are high in salt  --continue exercise routine  --RTO for CPE/medicare. Check fasting labs prior to OV. Will discuss zostavax at next OV  --get old records  --she decline influenza vaccine stating she has a severe intolerance (URI occurs) but will get prevnar today.  Dub Maclellan S. Perlie Gold  [facility] [address] [place], [place] [address] [phone] Office (Wednesdays and Fridays 8 AM - 5 PM) [phone] Cell (Monday-Friday 8 AM - 5 PM)

## 2015-02-07 ENCOUNTER — Ambulatory Visit
Admission: RE | Admit: 2015-02-07 | Discharge: 2015-02-07 | Disposition: A | Discharge status: Home / Self Care | Payer: Medicare Other | Source: Ambulatory Visit | Attending: Internal Medicine | Admitting: Internal Medicine

## 2015-02-07 ENCOUNTER — Other Ambulatory Visit: Payer: Self-pay | Admitting: Internal Medicine

## 2015-02-07 DIAGNOSIS — Z1231 Encounter for screening mammogram for malignant neoplasm of breast: Secondary | ICD-10-CM | POA: Diagnosis not present

## 2015-02-07 DIAGNOSIS — Z1239 Encounter for other screening for malignant neoplasm of breast: Secondary | ICD-10-CM

## 2015-02-08 ENCOUNTER — Other Ambulatory Visit: Payer: Self-pay | Admitting: Internal Medicine

## 2015-02-08 DIAGNOSIS — R928 Other abnormal and inconclusive findings on diagnostic imaging of breast: Secondary | ICD-10-CM

## 2015-02-11 ENCOUNTER — Other Ambulatory Visit: Payer: Medicare Other

## 2015-02-12 ENCOUNTER — Other Ambulatory Visit: Payer: Medicare Other

## 2015-02-12 DIAGNOSIS — R030 Elevated blood-pressure reading, without diagnosis of hypertension: Secondary | ICD-10-CM | POA: Diagnosis not present

## 2015-02-13 ENCOUNTER — Other Ambulatory Visit: Payer: Self-pay

## 2015-02-13 ENCOUNTER — Other Ambulatory Visit: Payer: Self-pay | Admitting: Internal Medicine

## 2015-02-13 DIAGNOSIS — R928 Other abnormal and inconclusive findings on diagnostic imaging of breast: Secondary | ICD-10-CM

## 2015-02-13 LAB — COMPREHENSIVE METABOLIC PANEL
ALK PHOS: 100 IU/L (ref 39–117)
ALT: 10 IU/L (ref 0–32)
AST: 15 IU/L (ref 0–40)
Albumin/Globulin Ratio: 1.4 (ref 1.1–2.5)
Albumin: 3.9 g/dL (ref 3.5–4.8)
BILIRUBIN TOTAL: 0.4 mg/dL (ref 0.0–1.2)
BUN/Creatinine Ratio: 14 (ref 11–26)
BUN: 13 mg/dL (ref 8–27)
CALCIUM: 10.3 mg/dL (ref 8.7–10.3)
CHLORIDE: 104 mmol/L (ref 97–108)
CO2: 22 mmol/L (ref 18–29)
CREATININE: 0.91 mg/dL (ref 0.57–1.00)
GFR calc Af Amer: 72 mL/min/{1.73_m2} (ref >59)
GFR calc non Af Amer: 63 mL/min/{1.73_m2} (ref >59)
Globulin, Total: 2.7 g/dL (ref 1.5–4.5)
Glucose: 110 mg/dL — ABNORMAL HIGH (ref 65–99)
Potassium: 4.7 mmol/L (ref 3.5–5.2)
Sodium: 142 mmol/L (ref 134–144)
Total Protein: 6.6 g/dL (ref 6.0–8.5)

## 2015-02-13 LAB — CBC WITH DIFFERENTIAL/PLATELET
Basophils Absolute: 0.1 10*3/uL (ref 0.0–0.2)
Basos: 2 %
Eos: 4 %
Eosinophils Absolute: 0.2 10*3/uL (ref 0.0–0.4)
HCT: 41.9 % (ref 34.0–46.6)
HEMOGLOBIN: 13.9 g/dL (ref 11.1–15.9)
Immature Grans (Abs): 0 10*3/uL (ref 0.0–0.1)
Immature Granulocytes: 0 %
LYMPHS ABS: 2.3 10*3/uL (ref 0.7–3.1)
LYMPHS: 44 %
MCH: 28.7 pg (ref 26.6–33.0)
MCHC: 33.2 g/dL (ref 31.5–35.7)
MCV: 87 fL (ref 79–97)
Monocytes Absolute: 0.4 10*3/uL (ref 0.1–0.9)
Monocytes: 7 %
Neutrophils Absolute: 2.2 10*3/uL (ref 1.4–7.0)
Neutrophils Relative %: 43 %
PLATELETS: 330 10*3/uL (ref 150–379)
RBC: 4.84 x10E6/uL (ref 3.77–5.28)
RDW: 14 % (ref 12.3–15.4)
WBC: 5.2 10*3/uL (ref 3.4–10.8)

## 2015-02-13 LAB — LIPID PANEL
CHOL/HDL RATIO: 4 ratio (ref 0.0–4.4)
Cholesterol, Total: 204 mg/dL — ABNORMAL HIGH (ref 100–199)
HDL: 51 mg/dL (ref >39)
LDL Calculated: 133 mg/dL — ABNORMAL HIGH (ref 0–99)
Triglycerides: 100 mg/dL (ref 0–149)
VLDL Cholesterol Cal: 20 mg/dL (ref 5–40)

## 2015-02-13 LAB — TSH: TSH: 1.76 u[IU]/mL (ref 0.450–4.500)

## 2015-02-14 ENCOUNTER — Ambulatory Visit
Admission: RE | Admit: 2015-02-14 | Discharge: 2015-02-14 | Disposition: A | Discharge status: Home / Self Care | Payer: Medicare Other | Source: Ambulatory Visit | Attending: Internal Medicine | Admitting: Internal Medicine

## 2015-02-14 DIAGNOSIS — R928 Other abnormal and inconclusive findings on diagnostic imaging of breast: Secondary | ICD-10-CM

## 2015-02-14 LAB — URINALYSIS, ROUTINE W REFLEX MICROSCOPIC
Bilirubin, UA: NEGATIVE
GLUCOSE, UA: NEGATIVE
Ketones, UA: NEGATIVE
NITRITE UA: NEGATIVE
PROTEIN UA: NEGATIVE
RBC UA: NEGATIVE
Specific Gravity, UA: 1.02 (ref 1.005–1.030)
Urobilinogen, Ur: 0.2 mg/dL (ref 0.2–1.0)
pH, UA: 6 (ref 5.0–7.5)

## 2015-02-14 LAB — MICROSCOPIC EXAMINATION
Casts: NONE SEEN /lpf
WBC, UA: >30 /hpf — AB (ref 0–?)

## 2015-02-15 ENCOUNTER — Encounter: Payer: Self-pay | Admitting: Internal Medicine

## 2015-02-15 ENCOUNTER — Ambulatory Visit (INDEPENDENT_AMBULATORY_CARE_PROVIDER_SITE_OTHER): Payer: Medicare Other | Admitting: Internal Medicine

## 2015-02-15 VITALS — BP 134/88 | HR 64 | Resp 16 | Ht 64.0 in | Wt 166.6 lb

## 2015-02-15 DIAGNOSIS — E785 Hyperlipidemia, unspecified: Secondary | ICD-10-CM | POA: Diagnosis not present

## 2015-02-15 DIAGNOSIS — I10 Essential (primary) hypertension: Secondary | ICD-10-CM | POA: Diagnosis not present

## 2015-02-15 DIAGNOSIS — Z853 Personal history of malignant neoplasm of breast: Secondary | ICD-10-CM | POA: Diagnosis not present

## 2015-02-15 DIAGNOSIS — R002 Palpitations: Secondary | ICD-10-CM | POA: Diagnosis not present

## 2015-02-15 DIAGNOSIS — R42 Dizziness and giddiness: Secondary | ICD-10-CM

## 2015-02-15 DIAGNOSIS — Z0000 Encounter for general adult medical examination without abnormal findings: Secondary | ICD-10-CM | POA: Diagnosis not present

## 2015-02-15 MED ORDER — LISINOPRIL 10 MG PO TABS: 10.0000 mg | ORAL_TABLET | Freq: Every day | ORAL | Status: DC

## 2015-02-15 NOTE — Progress Notes (Signed)
Passed clock drawing 

## 2015-02-15 NOTE — Progress Notes (Signed)
Patient ID: Erin Cunningham, female   DOB: 17-Mar-1941, 74 y.o.   MRN: [id number]    Facility  [facility]    Place of Service:   OFFICE   No Known Allergies  Chief Complaint  Patient presents with  . Annual Exam    Yearly check up, discuss labs (copy printed), no pap.  Marland Kitchen MMSE    29/30 (passed clock drawing)     HPI:  74 yo female seen today for comprehensive exam. She is c/a blood pressure. She had dizziness and palpitations with high readings. No HA, CP, SOB. Her BP has been fluctuating at home, SBP 173 this AM and went as high as 200 earlier this week. No change in vision. No syncope. She has taken BP med in the past but does no recall name of med.  She has a hx breast cancer but does not perform monthly self breast exams. She did have her mammogram earlier this week.  No other concerns. She plans to make an eye appt soon as she has not had one in several years   Past Medical History  Diagnosis Date  . Wears glasses   . Cancer   . Breast CA 2006    right [facility])  . Parotid mass 2014    right benign cyst  . History of shingles    Past Surgical History  Procedure Laterality Date  . Breast surgery  2004    rt br mass  . Eye surgery      both cataracts  . Breast lumpectomy with sentinel lymph node biopsy  2005    right  . Re-excision of breast lumpectomy  2005    right  . Abdominal hysterectomy  1982    BSO  . Cesarean section    . Colonoscopy    . Parotidectomy Right 09/05/2013    Procedure: RIGHT SUPERFICIAL PAROTIDECTOMY WITH FACIAL NERVE DISECTION;  Surgeon: [name], MD;  Location: [facility];  Service: ENT;  Laterality: Right;  All documentation done by R.Ward after [phone] --done under G. Garzon's password   Family History  Problem Relation Age of Onset  . Heart disease Mother     MI  . Cancer Father     bone   History   Social History  . Marital Status: Married    Spouse Name: N/A  . Number of Children: N/A  . Years of Education:  N/A   Social History Main Topics  . Smoking status: Never Smoker   . Smokeless tobacco: Never Used  . Alcohol Use: Yes     Comment: occ  . Drug Use: No  . Sexual Activity: Not Currently   Other Topics Concern  . None   Social History Narrative     Medications: Patient's Medications   No medications on file     Review of Systems  Constitutional: Negative for fever, chills, diaphoresis, activity change, appetite change and fatigue.  HENT: Negative for ear pain and sore throat.   Eyes: Negative for visual disturbance.  Respiratory: Negative for cough, chest tightness and shortness of breath.   Cardiovascular: Positive for palpitations. Negative for chest pain and leg swelling.  Gastrointestinal: Negative for nausea, vomiting, abdominal pain, diarrhea, constipation and blood in stool.  Genitourinary: Negative for dysuria.  Musculoskeletal: Negative for arthralgias.  Skin: Negative for rash.  Neurological: Positive for dizziness. Negative for tremors, numbness and headaches.  Psychiatric/Behavioral: Negative for sleep disturbance. The patient is not nervous/anxious.     Filed Vitals:  02/15/15 1359  BP: 134/88 ; repeat BP 140/80  Pulse: 64  Resp: 16  Height: 5\' 4"  (1.626 m)  Weight: 166 lb 9.6 oz (75.569 kg)  SpO2: 98%   Body mass index is 28.58 kg/(m^2).  Physical Exam  Constitutional: She is oriented to person, place, and time. She appears well-developed and well-nourished.  HENT:  Head: Normocephalic and atraumatic.  Right Ear: Hearing, tympanic membrane, external ear and ear canal normal.  Left Ear: Hearing, tympanic membrane, external ear and ear canal normal.  Mouth/Throat: Uvula is midline, oropharynx is clear and moist and mucous membranes are normal. No oropharyngeal exudate.  Eyes: EOM are normal. Pupils are equal, round, and reactive to light. No scleral icterus.  Neck: Neck supple. No tracheal deviation present. No thyromegaly present.  Cardiovascular:  Normal rate, regular rhythm, normal heart sounds and intact distal pulses.  Exam reveals no gallop and no friction rub.   No murmur heard. No LE edema b/l. no calf TTP. No carotid bruit b/l  Pulmonary/Chest: Effort normal and breath sounds normal. No stridor. No respiratory distress. She has no wheezes. She has no rales. Right breast exhibits skin change (due to incisional scar). Right breast exhibits no inverted nipple, no mass, no nipple discharge and no tenderness. Left breast exhibits no inverted nipple, no mass, no nipple discharge, no skin change and no tenderness. Breasts are asymmetrical (due to right lumpectomy).  Abdominal: Soft. Bowel sounds are normal. She exhibits no distension, no pulsatile midline mass and no mass. There is no hepatosplenomegaly. There is no tenderness. There is no rebound and no guarding.  Genitourinary: No breast swelling, tenderness, discharge or bleeding. Pelvic exam was performed with patient supine.  Lymphadenopathy:    She has no cervical adenopathy.  Neurological: She is alert and oriented to person, place, and time. She has normal reflexes.  Skin: Skin is warm and dry. No rash noted.  Psychiatric: She has a normal mood and affect. Her behavior is normal. Judgment and thought content normal.     Labs reviewed: Lab on 02/12/2015  Component Date Value Ref Range Status  . Glucose 02/12/2015 110* 65 - 99 mg/dL Final  . BUN 02/12/2015 13  8 - 27 mg/dL Final  . Creatinine, Ser 02/12/2015 0.91  0.57 - 1.00 mg/dL Final  . GFR calc non Af Amer 02/12/2015 63  >59 mL/min/1.73 Final  . GFR calc Af Amer 02/12/2015 72  >59 mL/min/1.73 Final  . BUN/Creatinine Ratio 02/12/2015 14  11 - 26 Final  . Sodium 02/12/2015 142  134 - 144 mmol/L Final  . Potassium 02/12/2015 4.7  3.5 - 5.2 mmol/L Final  . Chloride 02/12/2015 104  97 - 108 mmol/L Final  . CO2 02/12/2015 22  18 - 29 mmol/L Final  . Calcium 02/12/2015 10.3  8.7 - 10.3 mg/dL Final  . Total Protein 02/12/2015  6.6  6.0 - 8.5 g/dL Final  . Albumin 02/12/2015 3.9  3.5 - 4.8 g/dL Final  . Globulin, Total 02/12/2015 2.7  1.5 - 4.5 g/dL Final  . Albumin/Globulin Ratio 02/12/2015 1.4  1.1 - 2.5 Final  . Bilirubin Total 02/12/2015 0.4  0.0 - 1.2 mg/dL Final  . Alkaline Phosphatase 02/12/2015 100  39 - 117 IU/L Final  . AST 02/12/2015 15  0 - 40 IU/L Final  . ALT 02/12/2015 10  0 - 32 IU/L Final  . Cholesterol, Total 02/12/2015 204* 100 - 199 mg/dL Final  . Triglycerides 02/12/2015 100  0 - 149 mg/dL Final  .  HDL 02/12/2015 51  >39 mg/dL Final   Comment: According to ATP-III Guidelines, HDL-C >59 mg/dL is considered a negative risk factor for CHD.   Marland Kitchen VLDL Cholesterol Cal 02/12/2015 20  5 - 40 mg/dL Final  . LDL Calculated 02/12/2015 133* 0 - 99 mg/dL Final  . Chol/HDL Ratio 02/12/2015 4.0  0.0 - 4.4 ratio units Final   Comment:                                   T. Chol/HDL Ratio                                             Men  Women                               1/2 Avg.Risk  3.4    3.3                                   Avg.Risk  5.0    4.4                                2X Avg.Risk  9.6    7.1                                3X Avg.Risk 23.4   11.0   . WBC 02/12/2015 5.2  3.4 - 10.8 x10E3/uL Final  . RBC 02/12/2015 4.84  3.77 - 5.28 x10E6/uL Final  . Hemoglobin 02/12/2015 13.9  11.1 - 15.9 g/dL Final  . HCT 02/12/2015 41.9  34.0 - 46.6 % Final  . MCV 02/12/2015 87  79 - 97 fL Final  . MCH 02/12/2015 28.7  26.6 - 33.0 pg Final  . MCHC 02/12/2015 33.2  31.5 - 35.7 g/dL Final  . RDW 02/12/2015 14.0  12.3 - 15.4 % Final  . Platelets 02/12/2015 330  150 - 379 x10E3/uL Final  . Neutrophils Relative % 02/12/2015 43   Final  . Lymphs 02/12/2015 44   Final  . Monocytes 02/12/2015 7   Final  . Eos 02/12/2015 4   Final  . Basos 02/12/2015 2   Final  . Neutrophils Absolute 02/12/2015 2.2  1.4 - 7.0 x10E3/uL Final  . Lymphocytes Absolute 02/12/2015 2.3  0.7 - 3.1 x10E3/uL Final  . Monocytes Absolute  02/12/2015 0.4  0.1 - 0.9 x10E3/uL Final  . Eosinophils Absolute 02/12/2015 0.2  0.0 - 0.4 x10E3/uL Final  . Basophils Absolute 02/12/2015 0.1  0.0 - 0.2 x10E3/uL Final  . Immature Granulocytes 02/12/2015 0   Final  . Immature Grans (Abs) 02/12/2015 0.0  0.0 - 0.1 x10E3/uL Final  . TSH 02/12/2015 1.760  0.450 - 4.500 uIU/mL Final  . Specific Gravity, UA 02/12/2015 1.020  1.005 - 1.030 Final  . pH, UA 02/12/2015 6.0  5.0 - 7.5 Final  . Color, UA 02/12/2015 Yellow  Yellow Final  . Appearance Ur 02/12/2015 Cloudy* Clear Final  . Leukocytes, UA 02/12/2015 3+* Negative Final  . Protein, UA 02/12/2015 Negative  Negative/Trace Final  .  Glucose, UA 02/12/2015 Negative  Negative Final  . Ketones, UA 02/12/2015 Negative  Negative Final  . RBC, UA 02/12/2015 Negative  Negative Final  . Bilirubin, UA 02/12/2015 Negative  Negative Final  . Urobilinogen, Ur 02/12/2015 0.2  0.2 - 1.0 mg/dL Final  . Nitrite, UA 02/12/2015 Negative  Negative Final  . Microscopic Examination 02/12/2015 See below:   Final   Microscopic was indicated and was performed.  . WBC, UA 02/12/2015 >30* 0 -  5 /hpf Final  . RBC, UA 02/12/2015 0-2  0 -  2 /hpf Final  . Epithelial Cells (non renal) 02/12/2015 0-10  0 - 10 /hpf Final  . Casts 02/12/2015 None seen  None seen /lpf Final  . Crystals 02/12/2015 Present* N/A Final  . Crystal Type 02/12/2015 Amorphous Sediment  N/A Final  . Mucus, UA 02/12/2015 Present  Not Estab. Final  . Bacteria, UA 02/12/2015 Few  None seen/Few Final   ECG reviewed by myself - NSR @ 60 bpm, nml axis, LAE, poor R wave progression. No acute ischemic changes. No other ECG available to compare  Screening mammogram 3/16 benign  Assessment/Plan    ICD-9-CM ICD-10-CM   1. Encounter for annual physical exam V70.0 Z00.00   2. Dizziness due to elevated BP 780.4 R42 EKG 12-Lead  3. Essential hypertension, benign - uncontrolled 401.1 I10   4. Palpitations - due to elevated BP 785.1 R00.2   5. History of  breast cancer in female V10.3 Z85.3   6. Hyperlipidemia LDL goal <130 272.4 E78.5     --Recommend annual eye exam  --Recommend monthly self breast exams  --Follow up with GI for colonoscopy  --Check blood pressure daily at home and record. Bring diary to next visit. DASH diet.   --Watch complex carbs to reduce blood sugars. Low fat low cholesterol diet.  --Increase aerobic exercise 30-45 minutes 4-5 times per week  --she is UTD on immunizations  --Follow up in 1 month to recheck blood pressure  Erin Cunningham  [facility] [address] [place], [place] [address] [phone] Office (Wednesdays and Fridays 8 AM - 5 PM) [phone] Cell (Monday-Friday 8 AM - 5 PM)

## 2015-02-15 NOTE — Patient Instructions (Addendum)
Recommend annual eye exam  Recommend monthly self breast exams  Follow up with GI for colonoscopy  Check blood pressure daily at home and record. Bring diary to next visit. DASH diet.   Watch complex carbs to reduce blood sugars  Increase aerobic exercise 30-45 minutes 4-5 times per week  Follow up in 1 month to recheck blood pressure  DASH Eating Plan DASH stands for "Dietary Approaches to Stop Hypertension." The DASH eating plan is a healthy eating plan that has been shown to reduce high blood pressure (hypertension). Additional health benefits may include reducing the risk of type 2 diabetes mellitus, heart disease, and stroke. The DASH eating plan may also help with weight loss. WHAT DO I NEED TO KNOW ABOUT THE DASH EATING PLAN? For the DASH eating plan, you will follow these general guidelines:  Choose foods with a percent daily value for sodium of less than 5% (as listed on the food label).  Use salt-free seasonings or herbs instead of table salt or sea salt.  Check with your health care provider or pharmacist before using salt substitutes.  Eat lower-sodium products, often labeled as "lower sodium" or "no salt added."  Eat fresh foods.  Eat more vegetables, fruits, and low-fat dairy products.  Choose whole grains. Look for the word "whole" as the first word in the ingredient list.  Choose fish and skinless chicken or [place] more often than red meat. Limit fish, poultry, and meat to 6 oz (170 g) each day.  Limit sweets, desserts, sugars, and sugary drinks.  Choose heart-healthy fats.  Limit cheese to 1 oz (28 g) per day.  Eat more home-cooked food and less restaurant, buffet, and fast food.  Limit fried foods.  Cook foods using methods other than frying.  Limit canned vegetables. If you do use them, rinse them well to decrease the sodium.  When eating at a restaurant, ask that your food be prepared with less salt, or no salt if possible. WHAT FOODS CAN I  EAT? Seek help from a dietitian for individual calorie needs. Grains Whole grain or whole wheat bread. Brown rice. Whole grain or whole wheat pasta. Quinoa, bulgur, and whole grain cereals. Low-sodium cereals. Corn or whole wheat flour tortillas. Whole grain cornbread. Whole grain crackers. Low-sodium crackers. Vegetables Fresh or frozen vegetables (raw, steamed, roasted, or grilled). Low-sodium or reduced-sodium tomato and vegetable juices. Low-sodium or reduced-sodium tomato sauce and paste. Low-sodium or reduced-sodium canned vegetables.  Fruits All fresh, canned (in natural juice), or frozen fruits. Meat and Other Protein Products Ground beef (85% or leaner), grass-fed beef, or beef trimmed of fat. Skinless chicken or [place]. Ground chicken or [place]. Pork trimmed of fat. All fish and seafood. Eggs. Dried beans, peas, or lentils. Unsalted nuts and seeds. Unsalted canned beans. Dairy Low-fat dairy products, such as skim or 1% milk, 2% or reduced-fat cheeses, low-fat ricotta or cottage cheese, or plain low-fat yogurt. Low-sodium or reduced-sodium cheeses. Fats and Oils Tub margarines without trans fats. Light or reduced-fat mayonnaise and salad dressings (reduced sodium). Avocado. Safflower, olive, or canola oils. Natural peanut or almond butter. Other Unsalted popcorn and pretzels. The items listed above may not be a complete list of recommended foods or beverages. Contact your dietitian for more options. WHAT FOODS ARE NOT RECOMMENDED? Grains White bread. White pasta. White rice. Refined cornbread. Bagels and croissants. Crackers that contain trans fat. Vegetables Creamed or fried vegetables. Vegetables in a cheese sauce. Regular canned vegetables. Regular canned tomato sauce and paste. Regular tomato  and vegetable juices. Fruits Dried fruits. Canned fruit in light or heavy syrup. Fruit juice. Meat and Other Protein Products Fatty cuts of meat. Ribs, chicken wings, bacon, sausage,  bologna, salami, chitterlings, fatback, hot dogs, bratwurst, and packaged luncheon meats. Salted nuts and seeds. Canned beans with salt. Dairy Whole or 2% milk, cream, half-and-half, and cream cheese. Whole-fat or sweetened yogurt. Full-fat cheeses or blue cheese. Nondairy creamers and whipped toppings. Processed cheese, cheese spreads, or cheese curds. Condiments Onion and garlic salt, seasoned salt, table salt, and sea salt. Canned and packaged gravies. Worcestershire sauce. Tartar sauce. Barbecue sauce. Teriyaki sauce. Soy sauce, including reduced sodium. Steak sauce. Fish sauce. Oyster sauce. Cocktail sauce. Horseradish. Ketchup and mustard. Meat flavorings and tenderizers. Bouillon cubes. Hot sauce. Tabasco sauce. Marinades. Taco seasonings. Relishes. Fats and Oils Butter, stick margarine, lard, shortening, ghee, and bacon fat. Coconut, palm kernel, or palm oils. Regular salad dressings. Other Pickles and olives. Salted popcorn and pretzels. The items listed above may not be a complete list of foods and beverages to avoid. Contact your dietitian for more information. WHERE CAN I FIND MORE INFORMATION? National Heart, Lung, and Blood Institute: travelstabloid.com Document Released: 11/12/2011 Document Revised: 04/09/2014 Document Reviewed: 09/27/2013 [facility] Patient Information 2015 [place], [place]. This information is not intended to replace advice given to you by your health care provider. Make sure you discuss any questions you have with your health care provider.

## 2015-02-18 ENCOUNTER — Telehealth: Payer: Self-pay

## 2015-02-18 NOTE — Telephone Encounter (Signed)
1.) Patient called indicting she was unable to pick-up rx for Lisinopril it was 15 dollars. Patient says the maximum she can spend is 5-6 dollars. Patient would like a cheaper alternative    2.) Patient states " I was at church on Sunday and was in the spirit and I hit my head on the chair." Patient with slight headache and seeing floater in right eye since injury. Patient denies: vomiting, dizziness, or blurred vision. Patient would like to be evaluated to make sure no major damage was done. Patient schedule appointment for Wednesday at 3:30 pm (earliest patient could come in). Patient aware to seek medical attention if symptoms progress.   Please advise on both concerns.

## 2015-02-19 NOTE — Telephone Encounter (Signed)
Tell her she needs to take lisinopril as Rx and if cost is an issue, she needs to have med filled at [facility]. They have a $4 list of meds. Keep appt for eval of head trauma

## 2015-02-20 ENCOUNTER — Encounter: Payer: Self-pay | Admitting: Internal Medicine

## 2015-02-20 ENCOUNTER — Ambulatory Visit (INDEPENDENT_AMBULATORY_CARE_PROVIDER_SITE_OTHER): Payer: Medicare Other | Admitting: Internal Medicine

## 2015-02-20 VITALS — BP 170/82 | HR 90 | Temp 97.8°F | Resp 20 | Ht 64.0 in | Wt 166.2 lb

## 2015-02-20 DIAGNOSIS — I10 Essential (primary) hypertension: Secondary | ICD-10-CM

## 2015-02-20 DIAGNOSIS — S060X0A Concussion without loss of consciousness, initial encounter: Secondary | ICD-10-CM

## 2015-02-20 DIAGNOSIS — R519 Headache, unspecified: Secondary | ICD-10-CM

## 2015-02-20 DIAGNOSIS — R51 Headache: Secondary | ICD-10-CM | POA: Diagnosis not present

## 2015-02-20 DIAGNOSIS — W1809XA Striking against other object with subsequent fall, initial encounter: Secondary | ICD-10-CM

## 2015-02-20 MED ORDER — LISINOPRIL 10 MG PO TABS: 10.0000 mg | ORAL_TABLET | Freq: Every day | ORAL | Status: DC

## 2015-02-20 NOTE — Telephone Encounter (Signed)
Discuss with the patient what Dr. Eulas Post had advised about the prescription. Patient is coming in today and we will further address

## 2015-02-20 NOTE — Patient Instructions (Addendum)
Avoid auditory and visual stimuli over the next several days. No driving until symptoms resolve. Call office if not better by next week  Take Tylenol ES for pain  Keep f/u appt

## 2015-02-20 NOTE — Progress Notes (Signed)
Patient ID: Erin Cunningham, female   DOB: 08-Dec-1940, 74 y.o.   MRN: [id number]    Facility  [facility]    Place of Service:   OFFICE   No Known Allergies  Chief Complaint  Patient presents with  . Acute Visit    fall with head trauma on 02/17/15. no LOC    HPI:  74 yo female seen today s/p fall on March 13th. She hit her head. The fall was witnessed. No LOC but she did have a HA that has been intermittent since fall. Initial HA was the worst HA of her life but intensity has significant improved. No change in vision but did have floaters which has resolved. No dizziness. No loss of bowel/bladder control. No muscle weakness. She has photosensitivity. No tinnitus. No seizure activity.  She did not fill lisinopril Rx as it was too expensive. She shredded the rx and no longer has it. Her BP 2 hrs ago was 179/98.  Past Medical History  Diagnosis Date  . Wears glasses   . Cancer   . Breast CA 2006    right [facility])  . Parotid mass 2014    right benign cyst  . History of shingles    Past Surgical History  Procedure Laterality Date  . Breast surgery  2004    rt br mass  . Eye surgery      both cataracts  . Breast lumpectomy with sentinel lymph node biopsy  2005    right  . Re-excision of breast lumpectomy  2005    right  . Abdominal hysterectomy  1982    BSO  . Cesarean section    . Colonoscopy    . Parotidectomy Right 09/05/2013    Procedure: RIGHT SUPERFICIAL PAROTIDECTOMY WITH FACIAL NERVE DISECTION;  Surgeon: [name], MD;  Location: [facility];  Service: ENT;  Laterality: Right;  All documentation done by R.Ward after [phone] --done under G. Garzon's password   History   Social History  . Marital Status: Married    Spouse Name: N/A  . Number of Children: N/A  . Years of Education: N/A   Social History Main Topics  . Smoking status: Never Smoker   . Smokeless tobacco: Never Used  . Alcohol Use: Yes     Comment: occ  . Drug Use: No  . Sexual  Activity: Not Currently   Other Topics Concern  . None   Social History Narrative     Medications: Patient's Medications  New Prescriptions   No medications on file  Previous Medications   No medications on file  Modified Medications   Modified Medication Previous Medication   LISINOPRIL (PRINIVIL,ZESTRIL) 10 MG TABLET lisinopril (PRINIVIL,ZESTRIL) 10 MG tablet      Take 1 tablet (10 mg total) by mouth daily.    Take 1 tablet (10 mg total) by mouth daily.  Discontinued Medications   No medications on file     Review of Systems  Constitutional: Positive for fatigue. Negative for fever, chills, diaphoresis, activity change and appetite change.  HENT: Negative for ear pain, sore throat and trouble swallowing.   Eyes: Negative for visual disturbance.  Respiratory: Negative for cough, chest tightness and shortness of breath.   Cardiovascular: Negative for chest pain, palpitations and leg swelling.  Gastrointestinal: Negative for nausea, vomiting, abdominal pain, diarrhea, constipation and blood in stool.  Genitourinary: Negative for dysuria.  Musculoskeletal: Positive for arthralgias and neck pain. Negative for neck stiffness.  Skin: Negative for wound.  Neurological: Positive for headaches. Negative for dizziness, tremors, seizures, speech difficulty, weakness and numbness.  Psychiatric/Behavioral: Positive for decreased concentration. Negative for confusion, sleep disturbance and agitation. The patient is not nervous/anxious.     Filed Vitals:   02/20/15 1539  BP: 170/82  Pulse: 90  Temp: 97.8 F (36.6 C)  TempSrc: Oral  Resp: 20  Height: 5\' 4"  (1.626 m)  Weight: 166 lb 3.2 oz (75.388 kg)  SpO2: 94%   Body mass index is 28.51 kg/(m^2).  Physical Exam  Constitutional: She is oriented to person, place, and time. She appears well-developed and well-nourished.  HENT:  Mouth/Throat: Oropharynx is clear and moist. No oropharyngeal exudate.  Eyes: EOM are normal. Pupils  are equal, round, and reactive to light. No scleral icterus.  Neck: Neck supple. No tracheal deviation present.  Cardiovascular: Normal rate, regular rhythm, normal heart sounds and intact distal pulses.  Exam reveals no gallop and no friction rub.   No murmur heard. No LE edema b/l. no calf TTP. No carotid bruit b/l  Pulmonary/Chest: Effort normal and breath sounds normal. No stridor. No respiratory distress. She has no wheezes. She has no rales.  Abdominal: Soft. Bowel sounds are normal. She exhibits no distension and no mass. There is no tenderness. There is no rebound and no guarding.  Musculoskeletal: Normal range of motion.  Lymphadenopathy:    She has no cervical adenopathy.  Neurological: She is alert and oriented to person, place, and time. She has normal strength and normal reflexes. No cranial nerve deficit. She displays a negative Romberg sign. Gait normal.  CN 2-12 grossly intact  Skin: Skin is warm and dry. No rash noted.  Psychiatric: She has a normal mood and affect. Her behavior is normal. Judgment and thought content normal.     Labs reviewed: Lab on 02/12/2015  Component Date Value Ref Range Status  . Glucose 02/12/2015 110* 65 - 99 mg/dL Final  . BUN 02/12/2015 13  8 - 27 mg/dL Final  . Creatinine, Ser 02/12/2015 0.91  0.57 - 1.00 mg/dL Final  . GFR calc non Af Amer 02/12/2015 63  >59 mL/min/1.73 Final  . GFR calc Af Amer 02/12/2015 72  >59 mL/min/1.73 Final  . BUN/Creatinine Ratio 02/12/2015 14  11 - 26 Final  . Sodium 02/12/2015 142  134 - 144 mmol/L Final  . Potassium 02/12/2015 4.7  3.5 - 5.2 mmol/L Final  . Chloride 02/12/2015 104  97 - 108 mmol/L Final  . CO2 02/12/2015 22  18 - 29 mmol/L Final  . Calcium 02/12/2015 10.3  8.7 - 10.3 mg/dL Final  . Total Protein 02/12/2015 6.6  6.0 - 8.5 g/dL Final  . Albumin 02/12/2015 3.9  3.5 - 4.8 g/dL Final  . Globulin, Total 02/12/2015 2.7  1.5 - 4.5 g/dL Final  . Albumin/Globulin Ratio 02/12/2015 1.4  1.1 - 2.5 Final    . Bilirubin Total 02/12/2015 0.4  0.0 - 1.2 mg/dL Final  . Alkaline Phosphatase 02/12/2015 100  39 - 117 IU/L Final  . AST 02/12/2015 15  0 - 40 IU/L Final  . ALT 02/12/2015 10  0 - 32 IU/L Final  . Cholesterol, Total 02/12/2015 204* 100 - 199 mg/dL Final  . Triglycerides 02/12/2015 100  0 - 149 mg/dL Final  . HDL 02/12/2015 51  >39 mg/dL Final   Comment: According to ATP-III Guidelines, HDL-C >59 mg/dL is considered a negative risk factor for CHD.   Marland Kitchen VLDL Cholesterol Cal 02/12/2015 20  5 - 40 mg/dL  Final  . LDL Calculated 02/12/2015 133* 0 - 99 mg/dL Final  . Chol/HDL Ratio 02/12/2015 4.0  0.0 - 4.4 ratio units Final   Comment:                                   T. Chol/HDL Ratio                                             Men  Women                               1/2 Avg.Risk  3.4    3.3                                   Avg.Risk  5.0    4.4                                2X Avg.Risk  9.6    7.1                                3X Avg.Risk 23.4   11.0   . WBC 02/12/2015 5.2  3.4 - 10.8 x10E3/uL Final  . RBC 02/12/2015 4.84  3.77 - 5.28 x10E6/uL Final  . Hemoglobin 02/12/2015 13.9  11.1 - 15.9 g/dL Final  . HCT 02/12/2015 41.9  34.0 - 46.6 % Final  . MCV 02/12/2015 87  79 - 97 fL Final  . MCH 02/12/2015 28.7  26.6 - 33.0 pg Final  . MCHC 02/12/2015 33.2  31.5 - 35.7 g/dL Final  . RDW 02/12/2015 14.0  12.3 - 15.4 % Final  . Platelets 02/12/2015 330  150 - 379 x10E3/uL Final  . Neutrophils Relative % 02/12/2015 43   Final  . Lymphs 02/12/2015 44   Final  . Monocytes 02/12/2015 7   Final  . Eos 02/12/2015 4   Final  . Basos 02/12/2015 2   Final  . Neutrophils Absolute 02/12/2015 2.2  1.4 - 7.0 x10E3/uL Final  . Lymphocytes Absolute 02/12/2015 2.3  0.7 - 3.1 x10E3/uL Final  . Monocytes Absolute 02/12/2015 0.4  0.1 - 0.9 x10E3/uL Final  . Eosinophils Absolute 02/12/2015 0.2  0.0 - 0.4 x10E3/uL Final  . Basophils Absolute 02/12/2015 0.1  0.0 - 0.2 x10E3/uL Final  . Immature  Granulocytes 02/12/2015 0   Final  . Immature Grans (Abs) 02/12/2015 0.0  0.0 - 0.1 x10E3/uL Final  . TSH 02/12/2015 1.760  0.450 - 4.500 uIU/mL Final  . Specific Gravity, UA 02/12/2015 1.020  1.005 - 1.030 Final  . pH, UA 02/12/2015 6.0  5.0 - 7.5 Final  . Color, UA 02/12/2015 Yellow  Yellow Final  . Appearance Ur 02/12/2015 Cloudy* Clear Final  . Leukocytes, UA 02/12/2015 3+* Negative Final  . Protein, UA 02/12/2015 Negative  Negative/Trace Final  . Glucose, UA 02/12/2015 Negative  Negative Final  . Ketones, UA 02/12/2015 Negative  Negative Final  . RBC, UA 02/12/2015 Negative  Negative Final  . Bilirubin, UA 02/12/2015 Negative  Negative Final  . Urobilinogen, Ur  02/12/2015 0.2  0.2 - 1.0 mg/dL Final  . Nitrite, UA 02/12/2015 Negative  Negative Final  . Microscopic Examination 02/12/2015 See below:   Final   Microscopic was indicated and was performed.  . WBC, UA 02/12/2015 >30* 0 -  5 /hpf Final  . RBC, UA 02/12/2015 0-2  0 -  2 /hpf Final  . Epithelial Cells (non renal) 02/12/2015 0-10  0 - 10 /hpf Final  . Casts 02/12/2015 None seen  None seen /lpf Final  . Crystals 02/12/2015 Present* N/A Final  . Crystal Type 02/12/2015 Amorphous Sediment  N/A Final  . Mucus, UA 02/12/2015 Present  Not Estab. Final  . Bacteria, UA 02/12/2015 Few  None seen/Few Final     Assessment/Plan    ICD-9-CM ICD-10-CM   1. Concussion without loss of consciousness, initial encounter due to fall with head trauma on 02/17/15- mild 850.0 S06.0X0A CT Head Wo Contrast  2. Headache, unspecified headache type due to head trauma and subsequent concussion 784.0 R51 CT Head Wo Contrast  3. Essential hypertension, benign - uncontrolled - has not started lisinopril yet 401.1 I10   4. Fall against object, initial encounter [phone] [place] and cement floor on 02/17/15    --obtain CT head without contrast to r/o acute process  --may take Tylenol ES prn pain or HA  --avoid auditory and visual  stimuli for the next week or until HA resolves. NO DRIVING until HA resolves  --recommend taking lisinopril Rx to [facility] as med is on $4 list.  --keep appt as scheduled to f/u on BP and now concussion. Go to the ER if sx's worsen  Ruthann Angulo S. Perlie Gold  [facility] [address] [place], [place] [address] [phone] Office (Wednesdays and Fridays 8 AM - 5 PM) [phone] Cell (Monday-Friday 8 AM - 5 PM)

## 2015-02-22 ENCOUNTER — Telehealth: Payer: Self-pay

## 2015-02-22 NOTE — Telephone Encounter (Signed)
Discussed with patient, patient verbalized understanding of Dr.Cater's response

## 2015-02-22 NOTE — Telephone Encounter (Signed)
Avoid auditory/visual stimuli. Please schedule CT if not done already. Take tylenol ES for HA (1 gm TID if need be)

## 2015-02-22 NOTE — Telephone Encounter (Signed)
Patient states her headaches are getting worse. Patient would like to know the status of her CT appointment.   Patient would like additional recommendations while she is awaiting appointment for CT, Dr.Carter please advise.

## 2015-02-28 ENCOUNTER — Ambulatory Visit
Admission: RE | Admit: 2015-02-28 | Discharge: 2015-02-28 | Disposition: A | Discharge status: Home / Self Care | Payer: Medicare Other | Source: Ambulatory Visit | Attending: Internal Medicine | Admitting: Internal Medicine

## 2015-02-28 DIAGNOSIS — R51 Headache: Secondary | ICD-10-CM

## 2015-02-28 DIAGNOSIS — R519 Headache, unspecified: Secondary | ICD-10-CM

## 2015-02-28 DIAGNOSIS — S0990XA Unspecified injury of head, initial encounter: Secondary | ICD-10-CM | POA: Diagnosis not present

## 2015-02-28 DIAGNOSIS — S060X0A Concussion without loss of consciousness, initial encounter: Secondary | ICD-10-CM

## 2015-03-07 LAB — PULMONARY FUNCTION TEST

## 2015-04-03 ENCOUNTER — Ambulatory Visit (INDEPENDENT_AMBULATORY_CARE_PROVIDER_SITE_OTHER): Payer: Medicare Other | Admitting: Internal Medicine

## 2015-04-03 ENCOUNTER — Encounter: Payer: Self-pay | Admitting: Internal Medicine

## 2015-04-03 VITALS — BP 142/72 | HR 66 | Temp 97.5°F | Resp 20 | Ht 64.0 in | Wt 165.2 lb

## 2015-04-03 DIAGNOSIS — R7309 Other abnormal glucose: Secondary | ICD-10-CM

## 2015-04-03 DIAGNOSIS — E785 Hyperlipidemia, unspecified: Secondary | ICD-10-CM

## 2015-04-03 DIAGNOSIS — Z87820 Personal history of traumatic brain injury: Secondary | ICD-10-CM | POA: Diagnosis not present

## 2015-04-03 DIAGNOSIS — R7301 Impaired fasting glucose: Secondary | ICD-10-CM

## 2015-04-03 DIAGNOSIS — I10 Essential (primary) hypertension: Secondary | ICD-10-CM | POA: Diagnosis not present

## 2015-04-03 MED ORDER — LISINOPRIL 20 MG PO TABS: 20.0000 mg | ORAL_TABLET | Freq: Every day | ORAL | Status: DC

## 2015-04-03 NOTE — Patient Instructions (Addendum)
Increase lisinopril to 20mg  daily. Check blood pressure daily and call if it gets too ow (<110/60 or >160/100).  Start baby aspirin daily.  Follow up in 3 months for routine visit   Fat and Cholesterol Control Diet Your diet has an affect on your fat and cholesterol levels in your blood and organs. Too much fat and cholesterol in your blood can affect your:  Heart.  Blood vessels (arteries, veins).  Gallbladder.  Liver.  Pancreas. CONTROL FAT AND CHOLESTEROL WITH DIET Certain foods raise cholesterol and others lower it. It is important to replace bad fats with other types of fat.  Do not eat:  Fatty meats, such as hot dogs and salami.  Stick margarine and some tub margarines that have "partially hydrogenated oils" in them.  Baked goods, such as cookies and crackers that have "partially hydrogenated oils" in them.  Saturated tropical oils, such as coconut and palm oil. Eat the following foods:  Round or loin cuts of red meat.  Chicken (without skin).  Fish.  Veal.  Ground [place] breast.  Shellfish.  Fruit, such as apples.  Vegetables, such as broccoli, potatoes, and carrots.  Beans, peas, and lentils (legumes).  Grains, such as barley, rice, couscous, and bulgar wheat.  Pasta (without cream sauces). Look for foods that are nonfat, low in fat, and low in cholesterol.  FIND FOODS THAT ARE LOWER IN FAT AND CHOLESTEROL  Find foods with soluble fiber and plant sterols (phytosterol). You should eat 2 grams a day of these foods. These foods include:  Fruits.  Vegetables.  Whole grains.  Dried beans and peas.  Nuts and seeds.  Read package labels. Look for low-saturated fats, trans fat free, low-fat foods.  Choose cheese that have only 2 to 3 grams of saturated fat per ounce.  Use heart-healthy tub margarine that is free of trans fat or partially hydrogenated oil.  Avoid buying baked goods that have partially hydrogenated oils in them. Instead, buy  baked goods made with whole grains (whole-wheat or whole oat flour). Avoid baked goods labeled with "flour" or "enriched flour."  Buy non-creamy canned soups with reduced salt and no added fats. PREPARING YOUR FOOD  Broil, bake, steam, or roast foods. Do not fry food.  Use non-stick cooking sprays.  Use lemon or herbs to flavor food instead of using butter or stick margarine.  Use nonfat yogurt, salsa, or low-fat dressings for salads. LOW-SATURATED FAT / LOW-FAT FOOD SUBSTITUTES  Meats / Saturated Fat (g)  Avoid: Steak, marbled (3 oz/85 g) / 11 g.  Choose: Steak, lean (3 oz/85 g) / 4 g.  Avoid: Hamburger (3 oz/85 g) / 7 g.  Choose: Hamburger, lean (3 oz/85 g) / 5 g.  Avoid: Ham (3 oz/85 g) / 6 g.  Choose: Ham, lean cut (3 oz/85 g) / 2.4 g.  Avoid: Chicken, with skin, dark meat (3 oz/85 g) / 4 g.  Choose: Chicken, skin removed, dark meat (3 oz/85 g) / 2 g.  Avoid: Chicken, with skin, light meat (3 oz/85 g) / 2.5 g.  Choose: Chicken, skin removed, light meat (3 oz/85 g) / 1 g. Dairy / Saturated Fat (g)  Avoid: Whole milk (1 cup) / 5 g.  Choose: Low-fat milk, 2% (1 cup) / 3 g.  Choose: Low-fat milk, 1% (1 cup) / 1.5 g.  Choose: Skim milk (1 cup) / 0.3 g.  Avoid: Hard cheese (1 oz/28 g) / 6 g.  Choose: Skim milk cheese (1 oz/28 g) / 2  to 3 g.  Avoid: Cottage cheese, 4% fat (1 cup) / 6.5 g.  Choose: Low-fat cottage cheese, 1% fat (1 cup) / 1.5 g.  Avoid: Ice cream (1 cup) / 9 g.  Choose: Sherbet (1 cup) / 2.5 g.  Choose: Nonfat frozen yogurt (1 cup) / 0.3 g.  Choose: Frozen fruit bar / trace.  Avoid: Whipped cream (1 tbs) / 3.5 g.  Choose: Nondairy whipped topping (1 tbs) / 1 g. Condiments / Saturated Fat (g)  Avoid: Mayonnaise (1 tbs) / 2 g.  Choose: Low-fat mayonnaise (1 tbs) / 1 g.  Avoid: Butter (1 tbs) / 7 g.  Choose: Extra light margarine (1 tbs) / 1 g.  Avoid: Coconut oil (1 tbs) / 11.8 g.  Choose: Olive oil (1 tbs) / 1.8 g.  Choose: Corn  oil (1 tbs) / 1.7 g.  Choose: Safflower oil (1 tbs) / 1.2 g.  Choose: Sunflower oil (1 tbs) / 1.4 g.  Choose: Soybean oil (1 tbs) / 2.4 g .  Choose: Canola oil (1 tbs) / 1 g. Document Released: 05/24/2012 Document Revised: 07/26/2013 Document Reviewed: 02/22/2014 [facility] Patient Information 2015 [place], [place]. This information is not intended to replace advice given to you by your health care provider. Make sure you discuss any questions you have with your health care provider.

## 2015-04-03 NOTE — Progress Notes (Signed)
Patient ID: Erin Cunningham, female   DOB: Nov 28, 1941, 74 y.o.   MRN: [id number]    Facility  [facility]    Place of Service:   OFFICE    No Known Allergies  Chief Complaint  Patient presents with  . Medical Management of Chronic Issues    1 month follow-up, discuss labs (copy printed)    HPI:  74 yo female seen today for f/u of HTN and concussion. She fell on February 17, 2015. She had a CT head which was neg. She denies HA or dizziness. No change in vision. No further falls. No N/V, leg cramps. She is back walking and is down 2 lbs since last OV.   Past Medical History  Diagnosis Date  . Wears glasses   . Cancer   . Breast CA 2006    right [facility])  . Parotid mass 2014    right benign cyst  . History of shingles    Past Surgical History  Procedure Laterality Date  . Breast surgery  2004    rt br mass  . Eye surgery      both cataracts  . Breast lumpectomy with sentinel lymph node biopsy  2005    right  . Re-excision of breast lumpectomy  2005    right  . Abdominal hysterectomy  1982    BSO  . Cesarean section    . Colonoscopy    . Parotidectomy Right 09/05/2013    Procedure: RIGHT SUPERFICIAL PAROTIDECTOMY WITH FACIAL NERVE DISECTION;  Surgeon: [name], MD;  Location: [place];  Service: ENT;  Laterality: Right;  All documentation done by R.Ward after [phone] --done under G. Garzon's password   History   Social History  . Marital Status: Married    Spouse Name: N/A  . Number of Children: N/A  . Years of Education: N/A   Social History Main Topics  . Smoking status: Never Smoker   . Smokeless tobacco: Never Used  . Alcohol Use: Yes     Comment: occ  . Drug Use: No  . Sexual Activity: Not Currently   Other Topics Concern  . None   Social History Narrative    Medications: Patient's Medications  New Prescriptions   No medications on file  Previous Medications   LISINOPRIL (PRINIVIL,ZESTRIL) 10 MG TABLET    Take 1 tablet (10 mg  total) by mouth daily.  Modified Medications   No medications on file  Discontinued Medications   No medications on file     Review of Systems  Constitutional: Negative for fever, chills, diaphoresis, activity change, appetite change and fatigue.  HENT: Negative for ear pain and sore throat.   Eyes: Negative for visual disturbance.  Respiratory: Negative for cough, chest tightness and shortness of breath.   Cardiovascular: Negative for chest pain, palpitations and leg swelling.  Gastrointestinal: Negative for nausea, vomiting, abdominal pain, diarrhea, constipation and blood in stool.  Genitourinary: Negative for dysuria.  Musculoskeletal: Negative for arthralgias.  Neurological: Negative for dizziness, tremors, numbness and headaches.  Psychiatric/Behavioral: Negative for sleep disturbance. The patient is not nervous/anxious.     Filed Vitals:   04/03/15 0828  BP: 142/72  Pulse: 66  Temp: 97.5 F (36.4 C)  TempSrc: Oral  Resp: 20  Height: 5\' 4"  (1.626 m)  Weight: 165 lb 3.2 oz (74.934 kg)  SpO2: 97%   Body mass index is 28.34 kg/(m^2).  Physical Exam  Constitutional: She is oriented to person, place, and time.  Cardiovascular:  Normal rate, regular rhythm, normal heart sounds and intact distal pulses.  Exam reveals no gallop and no friction rub.   No murmur heard. Pulmonary/Chest: Effort normal and breath sounds normal. No respiratory distress. She has no wheezes. She has no rales.  Neurological: She is alert and oriented to person, place, and time.  Skin: Skin is warm and dry. No rash noted.  Psychiatric: She has a normal mood and affect. Her behavior is normal. Judgment and thought content normal.     Labs reviewed: Lab on 02/12/2015  Component Date Value Ref Range Status  . Glucose 02/12/2015 110* 65 - 99 mg/dL Final  . BUN 02/12/2015 13  8 - 27 mg/dL Final  . Creatinine, Ser 02/12/2015 0.91  0.57 - 1.00 mg/dL Final  . GFR calc non Af Amer 02/12/2015 63  >59  mL/min/1.73 Final  . GFR calc Af Amer 02/12/2015 72  >59 mL/min/1.73 Final  . BUN/Creatinine Ratio 02/12/2015 14  11 - 26 Final  . Sodium 02/12/2015 142  134 - 144 mmol/L Final  . Potassium 02/12/2015 4.7  3.5 - 5.2 mmol/L Final  . Chloride 02/12/2015 104  97 - 108 mmol/L Final  . CO2 02/12/2015 22  18 - 29 mmol/L Final  . Calcium 02/12/2015 10.3  8.7 - 10.3 mg/dL Final  . Total Protein 02/12/2015 6.6  6.0 - 8.5 g/dL Final  . Albumin 02/12/2015 3.9  3.5 - 4.8 g/dL Final  . Globulin, Total 02/12/2015 2.7  1.5 - 4.5 g/dL Final  . Albumin/Globulin Ratio 02/12/2015 1.4  1.1 - 2.5 Final  . Bilirubin Total 02/12/2015 0.4  0.0 - 1.2 mg/dL Final  . Alkaline Phosphatase 02/12/2015 100  39 - 117 IU/L Final  . AST 02/12/2015 15  0 - 40 IU/L Final  . ALT 02/12/2015 10  0 - 32 IU/L Final  . Cholesterol, Total 02/12/2015 204* 100 - 199 mg/dL Final  . Triglycerides 02/12/2015 100  0 - 149 mg/dL Final  . HDL 02/12/2015 51  >39 mg/dL Final   Comment: According to ATP-III Guidelines, HDL-C >59 mg/dL is considered a negative risk factor for CHD.   Marland Kitchen VLDL Cholesterol Cal 02/12/2015 20  5 - 40 mg/dL Final  . LDL Calculated 02/12/2015 133* 0 - 99 mg/dL Final  . Chol/HDL Ratio 02/12/2015 4.0  0.0 - 4.4 ratio units Final   Comment:                                   T. Chol/HDL Ratio                                             Men  Women                               1/2 Avg.Risk  3.4    3.3                                   Avg.Risk  5.0    4.4  2X Avg.Risk  9.6    7.1                                3X Avg.Risk 23.4   11.0   . WBC 02/12/2015 5.2  3.4 - 10.8 x10E3/uL Final  . RBC 02/12/2015 4.84  3.77 - 5.28 x10E6/uL Final  . Hemoglobin 02/12/2015 13.9  11.1 - 15.9 g/dL Final  . HCT 02/12/2015 41.9  34.0 - 46.6 % Final  . MCV 02/12/2015 87  79 - 97 fL Final  . MCH 02/12/2015 28.7  26.6 - 33.0 pg Final  . MCHC 02/12/2015 33.2  31.5 - 35.7 g/dL Final  . RDW 02/12/2015 14.0   12.3 - 15.4 % Final  . Platelets 02/12/2015 330  150 - 379 x10E3/uL Final  . Neutrophils Relative % 02/12/2015 43   Final  . Lymphs 02/12/2015 44   Final  . Monocytes 02/12/2015 7   Final  . Eos 02/12/2015 4   Final  . Basos 02/12/2015 2   Final  . Neutrophils Absolute 02/12/2015 2.2  1.4 - 7.0 x10E3/uL Final  . Lymphocytes Absolute 02/12/2015 2.3  0.7 - 3.1 x10E3/uL Final  . Monocytes Absolute 02/12/2015 0.4  0.1 - 0.9 x10E3/uL Final  . Eosinophils Absolute 02/12/2015 0.2  0.0 - 0.4 x10E3/uL Final  . Basophils Absolute 02/12/2015 0.1  0.0 - 0.2 x10E3/uL Final  . Immature Granulocytes 02/12/2015 0   Final  . Immature Grans (Abs) 02/12/2015 0.0  0.0 - 0.1 x10E3/uL Final  . TSH 02/12/2015 1.760  0.450 - 4.500 uIU/mL Final  . Specific Gravity, UA 02/12/2015 1.020  1.005 - 1.030 Final  . pH, UA 02/12/2015 6.0  5.0 - 7.5 Final  . Color, UA 02/12/2015 Yellow  Yellow Final  . Appearance Ur 02/12/2015 Cloudy* Clear Final  . Leukocytes, UA 02/12/2015 3+* Negative Final  . Protein, UA 02/12/2015 Negative  Negative/Trace Final  . Glucose, UA 02/12/2015 Negative  Negative Final  . Ketones, UA 02/12/2015 Negative  Negative Final  . RBC, UA 02/12/2015 Negative  Negative Final  . Bilirubin, UA 02/12/2015 Negative  Negative Final  . Urobilinogen, Ur 02/12/2015 0.2  0.2 - 1.0 mg/dL Final  . Nitrite, UA 02/12/2015 Negative  Negative Final  . Microscopic Examination 02/12/2015 See below:   Final   Microscopic was indicated and was performed.  . WBC, UA 02/12/2015 >30* 0 -  5 /hpf Final  . RBC, UA 02/12/2015 0-2  0 -  2 /hpf Final  . Epithelial Cells (non renal) 02/12/2015 0-10  0 - 10 /hpf Final  . Casts 02/12/2015 None seen  None seen /lpf Final  . Crystals 02/12/2015 Present* N/A Final  . Crystal Type 02/12/2015 Amorphous Sediment  N/A Final  . Mucus, UA 02/12/2015 Present  Not Estab. Final  . Bacteria, UA 02/12/2015 Few  None seen/Few Final     Assessment/Plan   ICD-9-CM ICD-10-CM   1.  Essential hypertension, benign - improving but still not controlled 401.1 I10   2. History of concussion - resolved sx's V15.52 Z87.820   3. Hyperlipidemia LDL goal <130 272.4 E78.5   4. Abnormal fasting glucose 790.29 R73.09    --increase lisinopril to 20mg  daily  --reduce fatty foods. Reduce complex CHO  --continue exercise routine  --f/u in 3 mos. Fasting labs prior to appt   [facility]  [place], [place] [address] [phone] Office (Wednesdays and Fridays 8 AM - 5 PM) [phone] Cell (Monday-Friday 8 AM - 5 PM)

## 2015-06-24 ENCOUNTER — Encounter (HOSPITAL_COMMUNITY): Payer: Self-pay | Admitting: Emergency Medicine

## 2015-06-24 ENCOUNTER — Emergency Department (EMERGENCY_DEPARTMENT_HOSPITAL)
Admission: EM | Admit: 2015-06-24 | Discharge: 2015-06-24 | Disposition: A | Discharge status: Other Acute Inpatient Hospital | Payer: 59 | Source: Home / Self Care | Attending: Emergency Medicine | Admitting: Emergency Medicine

## 2015-06-24 ENCOUNTER — Encounter (HOSPITAL_COMMUNITY): Payer: Self-pay

## 2015-06-24 ENCOUNTER — Inpatient Hospital Stay (HOSPITAL_COMMUNITY)
Admission: AD | Admit: 2015-06-24 | Discharge: 2015-06-26 | DRG: 885 | Disposition: A | Discharge status: Home / Self Care | Payer: 59 | Source: Intra-hospital | Attending: Psychiatry | Admitting: Psychiatry

## 2015-06-24 DIAGNOSIS — Z973 Presence of spectacles and contact lenses: Secondary | ICD-10-CM

## 2015-06-24 DIAGNOSIS — R45850 Homicidal ideations: Secondary | ICD-10-CM | POA: Diagnosis present

## 2015-06-24 DIAGNOSIS — Z8619 Personal history of other infectious and parasitic diseases: Secondary | ICD-10-CM

## 2015-06-24 DIAGNOSIS — F332 Major depressive disorder, recurrent severe without psychotic features: Secondary | ICD-10-CM | POA: Diagnosis present

## 2015-06-24 DIAGNOSIS — Z853 Personal history of malignant neoplasm of breast: Secondary | ICD-10-CM

## 2015-06-24 DIAGNOSIS — R51 Headache: Secondary | ICD-10-CM | POA: Insufficient documentation

## 2015-06-24 DIAGNOSIS — R6883 Chills (without fever): Secondary | ICD-10-CM | POA: Insufficient documentation

## 2015-06-24 DIAGNOSIS — Z79899 Other long term (current) drug therapy: Secondary | ICD-10-CM

## 2015-06-24 DIAGNOSIS — R45851 Suicidal ideations: Secondary | ICD-10-CM | POA: Diagnosis not present

## 2015-06-24 DIAGNOSIS — F329 Major depressive disorder, single episode, unspecified: Secondary | ICD-10-CM

## 2015-06-24 HISTORY — DX: Major depressive disorder, recurrent severe without psychotic features: F33.2

## 2015-06-24 LAB — CBC WITH DIFFERENTIAL/PLATELET
BASOS PCT: 2 % — AB (ref 0–1)
Basophils Absolute: 0.1 10*3/uL (ref 0.0–0.1)
Eosinophils Absolute: 0.2 10*3/uL (ref 0.0–0.7)
Eosinophils Relative: 3 % (ref 0–5)
HCT: 43.2 % (ref 36.0–46.0)
Hemoglobin: 13.9 g/dL (ref 12.0–15.0)
LYMPHS ABS: 2.3 10*3/uL (ref 0.7–4.0)
Lymphocytes Relative: 40 % (ref 12–46)
MCH: 28.5 pg (ref 26.0–34.0)
MCHC: 32.2 g/dL (ref 30.0–36.0)
MCV: 88.5 fL (ref 78.0–100.0)
MONOS PCT: 10 % (ref 3–12)
Monocytes Absolute: 0.5 10*3/uL (ref 0.1–1.0)
NEUTROS ABS: 2.6 10*3/uL (ref 1.7–7.7)
NEUTROS PCT: 45 % (ref 43–77)
Platelets: 315 10*3/uL (ref 150–400)
RBC: 4.88 MIL/uL (ref 3.87–5.11)
RDW: 13.2 % (ref 11.5–15.5)
WBC: 5.7 10*3/uL (ref 4.0–10.5)

## 2015-06-24 LAB — COMPREHENSIVE METABOLIC PANEL
ALT: 14 U/L (ref 14–54)
ANION GAP: 4 — AB (ref 5–15)
AST: 20 U/L (ref 15–41)
Albumin: 4.1 g/dL (ref 3.5–5.0)
Alkaline Phosphatase: 89 U/L (ref 38–126)
BILIRUBIN TOTAL: 0.8 mg/dL (ref 0.3–1.2)
BUN: 15 mg/dL (ref 6–20)
CO2: 28 mmol/L (ref 22–32)
Calcium: 10.2 mg/dL (ref 8.9–10.3)
Chloride: 107 mmol/L (ref 101–111)
Creatinine, Ser: 0.88 mg/dL (ref 0.44–1.00)
GFR calc Af Amer: >60 mL/min (ref >60)
GFR calc non Af Amer: >60 mL/min (ref >60)
Glucose, Bld: 107 mg/dL — ABNORMAL HIGH (ref 65–99)
POTASSIUM: 4.2 mmol/L (ref 3.5–5.1)
SODIUM: 139 mmol/L (ref 135–145)
Total Protein: 7.3 g/dL (ref 6.5–8.1)

## 2015-06-24 LAB — ETHANOL

## 2015-06-24 MED ORDER — LISINOPRIL 20 MG PO TABS
20.0000 mg | ORAL_TABLET | Freq: Once | ORAL | Status: AC
  Administered 2015-06-24: 20 mg via ORAL
  Filled 2015-06-24: qty 1

## 2015-06-24 MED ORDER — ADULT MULTIVITAMIN W/MINERALS CH
1.0000 | ORAL_TABLET | Freq: Every day | ORAL | Status: DC
  Filled 2015-06-24 (×4): qty 1

## 2015-06-24 MED ORDER — LORAZEPAM 0.5 MG PO TABS
1.0000 mg | ORAL_TABLET | Freq: Three times a day (TID) | ORAL | Status: DC | PRN
  Administered 2015-06-24: 1 mg via ORAL
  Filled 2015-06-24: qty 1

## 2015-06-24 MED ORDER — TRAZODONE HCL 50 MG PO TABS
50.0000 mg | ORAL_TABLET | Freq: Every evening | ORAL | Status: DC | PRN
  Administered 2015-06-24 – 2015-06-25 (×2): 50 mg via ORAL
  Filled 2015-06-24 (×7): qty 1

## 2015-06-24 MED ORDER — ACETAMINOPHEN 325 MG PO TABS: 650.0000 mg | ORAL_TABLET | Freq: Four times a day (QID) | ORAL | Status: DC | PRN

## 2015-06-24 MED ORDER — ALUM & MAG HYDROXIDE-SIMETH 200-200-20 MG/5ML PO SUSP: 30.0000 mL | ORAL | Status: DC | PRN

## 2015-06-24 MED ORDER — MAGNESIUM HYDROXIDE 400 MG/5ML PO SUSP: 30.0000 mL | Freq: Every day | ORAL | Status: DC | PRN

## 2015-06-24 MED ORDER — ONDANSETRON HCL 4 MG PO TABS
4.0000 mg | ORAL_TABLET | Freq: Three times a day (TID) | ORAL | Status: DC | PRN
Start: 2015-06-24 — End: 2015-06-24

## 2015-06-24 MED ORDER — LISINOPRIL 20 MG PO TABS: 20.0000 mg | ORAL_TABLET | Freq: Every day | ORAL | Status: DC

## 2015-06-24 MED ORDER — ACETAMINOPHEN 325 MG PO TABS: 650.0000 mg | ORAL_TABLET | ORAL | Status: DC | PRN

## 2015-06-24 MED ORDER — LISINOPRIL 20 MG PO TABS
20.0000 mg | ORAL_TABLET | Freq: Every day | ORAL | Status: DC
  Administered 2015-06-25 – 2015-06-26 (×2): 20 mg via ORAL
  Filled 2015-06-24 (×4): qty 1

## 2015-06-24 MED ORDER — HYDROCHLOROTHIAZIDE 12.5 MG PO CAPS
12.5000 mg | ORAL_CAPSULE | Freq: Every day | ORAL | Status: DC
  Administered 2015-06-24 – 2015-06-25 (×2): 12.5 mg via ORAL
  Filled 2015-06-24 (×6): qty 1

## 2015-06-24 MED ORDER — HYDROXYZINE HCL 25 MG PO TABS: 25.0000 mg | ORAL_TABLET | Freq: Three times a day (TID) | ORAL | Status: DC | PRN

## 2015-06-24 MED ORDER — TRAZODONE HCL 50 MG PO TABS: 50.0000 mg | ORAL_TABLET | Freq: Every day | ORAL | Status: DC
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
## 2015-06-24 NOTE — ED Notes (Signed)
Patient transferred to [facility].  Left the unit ambulatory with [employer].  All belongings given to the driver.
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
## 2015-06-24 NOTE — ED Notes (Signed)
TTS at bedside. 
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
## 2015-06-24 NOTE — [facility] Counselor (Signed)
Disposition: Recommended inpatient treatment per Dr. Loni Muse and Reginold Agent. TTS to seek placement.   Bedelia Person, M.S., LPCA, [place], [facility] Licensed Professional Counselor Associate  Triage Specialist  [facility]  Therapeutic Triage Services Phone: [phone] Fax: [phone]
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
## 2015-06-25 ENCOUNTER — Encounter (HOSPITAL_COMMUNITY): Payer: Self-pay | Admitting: Psychiatry

## 2015-06-25 DIAGNOSIS — F332 Major depressive disorder, recurrent severe without psychotic features: Principal | ICD-10-CM

## 2015-06-25 MED ORDER — HYDROCHLOROTHIAZIDE 25 MG PO TABS
25.0000 mg | ORAL_TABLET | Freq: Every day | ORAL | Status: DC
  Administered 2015-06-26: 25 mg via ORAL
  Filled 2015-06-25 (×2): qty 1

## 2015-06-25 MED ORDER — HYDRALAZINE HCL 10 MG PO TABS
10.0000 mg | ORAL_TABLET | Freq: Four times a day (QID) | ORAL | Status: DC | PRN
  Administered 2015-06-25: 10 mg via ORAL
  Filled 2015-06-25: qty 1

## 2015-06-25 MED ORDER — SERTRALINE HCL 25 MG PO TABS
25.0000 mg | ORAL_TABLET | Freq: Every day | ORAL | Status: DC
  Administered 2015-06-25 – 2015-06-26 (×2): 25 mg via ORAL
  Filled 2015-06-25 (×3): qty 1

## 2015-06-25 NOTE — Tx Team (Signed)
Interdisciplinary Treatment Plan Update (Adult) Date: 06/25/2015    Time Reviewed: 9:30 AM  Progress in Treatment: Attending groups: Continuing to assess, patient new to milieu Participating in groups: Continuing to assess, patient new to milieu Taking medication as prescribed: Yes Tolerating medication: Yes Family/Significant other contact made: No, CSW assessing for appropriate contacts Patient understands diagnosis: Yes Discussing patient identified problems/goals with staff: Yes Medical problems stabilized or resolved: Yes Denies suicidal/homicidal ideation: Yes Issues/concerns per patient self-inventory: Yes Other:  New problem(s) identified: N/A  Discharge Plan or Barriers: 06/25/2015:  CSW continuing to assess, patient new to milieu.  Reason for Continuation of Hospitalization:  Depression Anxiety Medication Stabilization   Comments: N/A  Estimated length of stay: 3-5 days  For review of initial/current patient goals, please see plan of care. Patient is a 74 year old female admitted for SI for several months and plan to drive her car into something. Patient will benefit from crisis stabilization, medication evaluation, group therapy, and psycho education in addition to case management for discharge planning. Patient and CSW reviewed pt's identified goals and treatment plan. Pt verbalized understanding and agreed to treatment plan.   Attendees: Patient:    Family:    Physician: Dr. Parke Poisson; Dr. Sabra Heck 06/25/2015 9:30 AM  Nursing: Franco Nones, Mayra Neer, [place], [place] 06/25/2015 9:30 AM  Clinical Social Worker: Tilden Fossa,  [place] 06/25/2015 9:30 AM  Other: Peri Maris, LCSWA  06/25/2015 9:30 AM  Other: Lucinda Dell, [place] [facility] Liaison 06/25/2015 9:30 AM  Other:  06/25/2015 9:30 AM  Other: Ave Filter , NP 06/25/2015 9:30 AM  Other:    Other:      Scribe for Treatment Team:  Tilden Fossa, MSW, [employer] [phone]

## 2015-06-25 NOTE — BHH Group Notes (Signed)
[place] LCSW Group Therapy 06/25/2015 1:15 PM Type of Therapy: Group Therapy Participation Level: Active  Participation Quality: Attentive, Sharing and Supportive  Affect: Appropriate  Cognitive: Alert and Oriented  Insight: Developing/Improving and Engaged  Engagement in Therapy: Developing/Improving and Engaged  Modes of Intervention: Activity, Clarification, Confrontation, Discussion, Education, Exploration, Limit-setting, Orientation, Problem-solving, Rapport Building, Art therapist, Socialization and Support  Summary of Progress/Problems: Patient was attentive and engaged with speaker from [place]. Patient was attentive to speaker while they shared their story of dealing with mental health and overcoming it. Patient expressed interest in their programs and services and received information on their agency. Patient processed ways they can relate to the speaker. At the end of group, patient inquired how to handle a loved one who is in recovery. Guest speaker encouraged her to attend Al-Anon or NAR-Anon.  Tilden Fossa, MSW, [place] Worker [facility] [phone]
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
## 2015-06-25 NOTE — BHH Group Notes (Signed)
 Group Notes:  (Nursing/MHT/Case Management/Adjunct)  Date:  06/25/2015  Time:  12:58 PM  Type of Therapy:  Nurse Education  Participation Level:  Active  Participation Quality:  Appropriate  Affect:  Flat  Cognitive:  Appropriate  Insight:  Appropriate  Engagement in Group:  Engaged  Modes of Intervention:  Discussion and Education  Summary of Progress/Problems: The purpose of this group is to orient patient's to the unit's daily activities and to discuss the topic of the day which is Recovery. Patient attended group and was engaged.   Gaylan Gerold E 06/25/2015, 12:58 PM

## 2015-06-25 NOTE — Progress Notes (Signed)
Patient ID: Erin Cunningham, female   DOB: 02/21/1941, 74 y.o.   MRN: [id number]  DAR: Pt. Denies SI/HI and A/V Hallucinations. She reports that she slept good last night, appetite is good, concentration is good and energy level is normal. Patient reports her depression is between an 8-9 today. Patient does not report any pain or discomfort at this time. Support and encouragement provided to the patient. Scheduled medications administered to patient per physician's orders. No PRN medications needed at this time. Patient is cooperative with plan of care but minimal. Patient is seen in the milieu and is attending groups. She continues to report that she is feeling great and ready to go home. However, mood continues to be depressed. Q15 minute checks are maintained for safety.
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
## 2015-06-25 NOTE — [facility] Suicide Risk Assessment (Signed)
[place] INPATIENT:  Family/Significant Other Suicide Prevention Education  Suicide Prevention Education:  Patient Refusal for Family/Significant Other Suicide Prevention Education: The patient Erin Cunningham has refused to provide written consent for family/significant other to be provided Family/Significant Other Suicide Prevention Education during admission and/or prior to discharge.  Physician notified.  Beverely Pace 06/25/2015, 4:18 PM

## 2015-06-25 NOTE — Progress Notes (Signed)
Recreation Therapy Notes  Animal-Assisted Activity (AAA) Program Checklist/Progress Notes Patient Eligibility Criteria Checklist & Daily Group note for Rec Tx Intervention  Date: 07.19.16 Time: 2:45 pm Location: [address]   AAA/T Program Assumption of Risk Form signed by Patient/ or Parent Legal Guardian yes  Patient is free of allergies or sever asthma yes  Patient reports no fear of animals yes  Patient reports no history of cruelty to animalsyes  Patient understands his/her participation is voluntary yes  Patient washes hands before animal contact yes  Patient washes hands after animal contact yes  Education: Hand Washing, Appropriate Animal Interaction   Education Outcome: Acknowledges understanding/In group clarification offered  Clinical Observations/Feedback: Patient did not attend group.   Victorino Sparrow, LRT/CTRS         Ria Comment, Eliz Nigg A 06/25/2015 4:05 PM

## 2015-06-25 NOTE — Plan of Care (Signed)
Problem: Ineffective individual coping Goal: STG: Patient will remain free from self harm Outcome: Progressing Patient remains free from self harm. Patient denies SI
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
## 2015-06-25 NOTE — H&P (Signed)
Psychiatric Admission Assessment Adult  Patient Identification: Erin Cunningham  MRN:  [id number]  Date of Evaluation:  06/25/2015  Chief Complaint:  MDD Recurrent, Severe  Principal Diagnosis: MDD (major depressive disorder), recurrent episode, severe  Diagnosis:   Patient Active Problem List   Diagnosis Date Noted  . Major depressive disorder, recurrent, severe without psychotic features [F33.2] 06/24/2015  . MDD (major depressive disorder), recurrent episode, severe [F33.2] 06/24/2015  . Suicidal ideation [R45.851]   . Onychomycosis [B35.1] 06/12/2014  . Pain in lower limb [M79.606] 06/12/2014   History of Present Illness: Erin Cunningham is a 74 year old AA female. Admitted to [facility] as walk-in with complaints of both suicidal/homicidal ideations. She reports, "I'm not dealing with my church folks very well lately. There is a one particular usher at [employer] who does her best to get under my skin. This lady is very friendly with the other church members but me. She stands in front of the church to say hi to the incoming church members. She will great everyone else very well but me. I have to talk to her first. On Sunday, 2 days ago, my spirit started going down. I was sitting at the [employer]. I saw this church member doing something, I offered to help her with what she was doing. She had an attitude, it was a bad attitude. She was just rude to me. I got very upset. I could not get over it. That is when I started having this thought about hurting her badly, then myself. But, I got myself together & came to the hospital instead. I was seeing Dr. Adele Schilder on the outpatient clinic of this hospital. I was on Zoloft, was doing very well, but stopped about 1 year ago. I saw Dr. Adele Schilder about 1 year ago as well. I need to get back on my medicine & resume seeing Dr. Adele Schilder again. I feel a lot better now. Can I go home"  Elements:  Location:  Mdd, recurrent episodes. Quality:  Suicidal ideations, homicidal ideations,  . Severity:  Moderate. Timing:  Worsened on Sunday (2 days ago). Duration:  Chronic depression. Context:  "A church member was mean to me, had an attidute, got me very upset".  Associated Signs/Symptoms:  Depression Symptoms:  depressed mood,  (Hypo) Manic Symptoms:  Irritable Mood,  Anxiety Symptoms:  Excessive Worry,  Psychotic Symptoms:  Denies  PTSD Symptoms: NA  Total Time spent with patient: 1 hour  Past Medical History:  Past Medical History  Diagnosis Date  . Wears glasses   . Cancer   . Breast CA 2006    right [facility])  . Parotid mass 2014    right benign cyst  . History of shingles     Past Surgical History  Procedure Laterality Date  . Breast surgery  2004    rt br mass  . Eye surgery      both cataracts  . Breast lumpectomy with sentinel lymph node biopsy  2005    right  . Re-excision of breast lumpectomy  2005    right  . Abdominal hysterectomy  1982    BSO  . Cesarean section    . Colonoscopy    . Parotidectomy Right 09/05/2013    Procedure: RIGHT SUPERFICIAL PAROTIDECTOMY WITH FACIAL NERVE DISECTION;  Surgeon: [name], MD;  Location: [facility] [facility];  Service: ENT;  Laterality: Right;  All documentation done by R.Ward after [phone] --done under G. Garzon's password   Family History:  Family  History  Problem Relation Age of Onset  . Heart disease Mother     MI  . Cancer Father     bone   Social History:  History  Alcohol Use  . Yes    Comment: occ     History  Drug Use No    History   Social History  . Marital Status: Married    Spouse Name: N/A  . Number of Children: N/A  . Years of Education: N/A   Social History Main Topics  . Smoking status: Never Smoker   . Smokeless tobacco: Never Used  . Alcohol Use: Yes     Comment: occ  . Drug Use: No  . Sexual Activity: Not Currently   Other Topics Concern  . None   Social History Narrative   Additional Social History:    History of alcohol / drug use?:  No history of alcohol / drug abuse  Musculoskeletal: Strength & Muscle Tone: within normal limits Gait & Station: normal Patient leans: N/A  Psychiatric Specialty Exam: Physical Exam  Constitutional: She is oriented to person, place, and time. She appears well-developed.  HENT:  Head: Normocephalic.  Eyes: Pupils are equal, round, and reactive to light.  Neck: Normal range of motion.  Cardiovascular: Normal rate.   Respiratory: Effort normal.  GI: Soft.  Genitourinary:  Denies any issues in this area   Musculoskeletal: Normal range of motion.  Neurological: She is alert and oriented to person, place, and time.  Skin: Skin is warm and dry.  Psychiatric: Her speech is normal and behavior is normal. Thought content normal. Her mood appears anxious. Her affect is not angry, not blunt, not labile and not inappropriate. Cognition and memory are normal. She expresses impulsivity and inappropriate judgment. She exhibits a depressed mood.    Review of Systems  Constitutional: Negative.   HENT: Negative.   Eyes: Negative.        Hx. High blood pressure   Respiratory: Negative.   Cardiovascular: Negative.   Gastrointestinal: Negative.   Genitourinary: Negative.   Musculoskeletal: Positive for myalgias.  Skin: Negative.   Neurological: Negative.   Endo/Heme/Allergies: Negative.   Psychiatric/Behavioral: Positive for depression and suicidal ideas. Negative for hallucinations, memory loss and substance abuse. The patient is nervous/anxious and has insomnia.     Blood pressure 123/82, pulse 87, temperature 98.5 F (36.9 C), temperature source Oral, resp. rate 18, height 5' 2.5" (1.588 m), weight 75.524 kg (166 lb 8 oz), SpO2 100 %.Body mass index is 29.95 kg/(m^2).  General Appearance: Casual and Neat  Eye Contact::  Good  Speech:  Clear and Coherent  Volume:  Normal  Mood:  Depressed  Affect:  Appropriate and Non-Congruent  Thought Process:  Coherent, Goal Directed and Logical   Orientation:  Full (Time, Place, and Person)  Thought Content:  Rumination  Suicidal Thoughts:  No  Homicidal Thoughts:  No  Memory:  Grossly intact  Judgement:  Good  Insight:  Present  Psychomotor Activity:  Normal  Concentration:  Good  Recall:  Good  Fund of Knowledge:Good  Language: Good  Akathisia:  No  Handed:  Right  AIMS (if indicated):     Assets:  Communication Skills Desire for Improvement  ADL's:  Intact  Cognition: WNL  Sleep:      Risk to Self: Is patient at risk for suicide?: Yes Risk to Others: No Prior Inpatient Therapy: Yes Prior Outpatient Therapy: No  Alcohol Screening: 1. How often do you have a drink containing  alcohol?: Monthly or less 2. How many drinks containing alcohol do you have on a typical day when you are drinking?: 1 or 2 3. How often do you have six or more drinks on one occasion?: Never Preliminary Score: 0 9. Have you or someone else been injured as a result of your drinking?: No 10. Has a relative or friend or a doctor or another health worker been concerned about your drinking or suggested you cut down?: No Alcohol Use Disorder Identification Test Final Score (AUDIT): 1 Brief Intervention: Patient declined brief intervention  Allergies:  No Known Allergies Lab Results:  Results for orders placed or performed during the hospital encounter of 06/24/15 (from the past 48 hour(s))  Comprehensive metabolic panel     Status: Abnormal   Collection Time: 06/24/15  7:28 AM  Result Value Ref Range   Sodium 139 135 - 145 mmol/L   Potassium 4.2 3.5 - 5.1 mmol/L   Chloride 107 101 - 111 mmol/L   CO2 28 22 - 32 mmol/L   Glucose, Bld 107 (H) 65 - 99 mg/dL   BUN 15 6 - 20 mg/dL   Creatinine, Ser 0.88 0.44 - 1.00 mg/dL   Calcium 10.2 8.9 - 10.3 mg/dL   Total Protein 7.3 6.5 - 8.1 g/dL   Albumin 4.1 3.5 - 5.0 g/dL   AST 20 15 - 41 U/L   ALT 14 14 - 54 U/L   Alkaline Phosphatase 89 38 - 126 U/L   Total Bilirubin 0.8 0.3 - 1.2 mg/dL   GFR  calc non Af Amer >60 >60 mL/min   GFR calc Af Amer >60 >60 mL/min    Comment: (NOTE) The eGFR has been calculated using the CKD EPI equation. This calculation has not been validated in all clinical situations. eGFR's persistently <60 mL/min signify possible Chronic Kidney Disease.    Anion gap 4 (L) 5 - 15  CBC with Differential/Platelet     Status: Abnormal   Collection Time: 06/24/15  7:28 AM  Result Value Ref Range   WBC 5.7 4.0 - 10.5 K/uL   RBC 4.88 3.87 - 5.11 MIL/uL   Hemoglobin 13.9 12.0 - 15.0 g/dL   HCT 43.2 36.0 - 46.0 %   MCV 88.5 78.0 - 100.0 fL   MCH 28.5 26.0 - 34.0 pg   MCHC 32.2 30.0 - 36.0 g/dL   RDW 13.2 11.5 - 15.5 %   Platelets 315 150 - 400 K/uL   Neutrophils Relative % 45 43 - 77 %   Neutro Abs 2.6 1.7 - 7.7 K/uL   Lymphocytes Relative 40 12 - 46 %   Lymphs Abs 2.3 0.7 - 4.0 K/uL   Monocytes Relative 10 3 - 12 %   Monocytes Absolute 0.5 0.1 - 1.0 K/uL   Eosinophils Relative 3 0 - 5 %   Eosinophils Absolute 0.2 0.0 - 0.7 K/uL   Basophils Relative 2 (H) 0 - 1 %   Basophils Absolute 0.1 0.0 - 0.1 K/uL  Ethanol     Status: None   Collection Time: 06/24/15  7:28 AM  Result Value Ref Range   Alcohol, Ethyl (B) <5 <5 mg/dL    Comment:        LOWEST DETECTABLE LIMIT FOR SERUM ALCOHOL IS 5 mg/dL FOR MEDICAL PURPOSES ONLY    Current Medications: Current Facility-Administered Medications  Medication Dose Route Frequency Provider Last Rate Last Dose  . acetaminophen (TYLENOL) tablet 650 mg  650 mg Oral Q6H PRN Laverle Hobby,  PA-C      . alum & mag hydroxide-simeth (MAALOX/MYLANTA) 200-200-20 MG/5ML suspension 30 mL  30 mL Oral Q4H PRN Laverle Hobby, PA-C      . hydrochlorothiazide (MICROZIDE) capsule 12.5 mg  12.5 mg Oral Daily Laverle Hobby, PA-C   12.5 mg at 06/25/15 0825  . lisinopril (PRINIVIL,ZESTRIL) tablet 20 mg  20 mg Oral Daily Laverle Hobby, PA-C   20 mg at 06/25/15 0825  . magnesium hydroxide (MILK OF MAGNESIA) suspension 30 mL  30 mL Oral  Daily PRN Laverle Hobby, PA-C      . multivitamin with minerals tablet 1 tablet  1 tablet Oral Daily Laverle Hobby, PA-C   1 tablet at 06/25/15 0825  . traZODone (DESYREL) tablet 50 mg  50 mg Oral QHS,MR X 1 Laverle Hobby, PA-C   50 mg at 06/24/15 2154   PTA Medications: Prescriptions prior to admission  Medication Sig Dispense Refill Last Dose  . lisinopril (PRINIVIL,ZESTRIL) 20 MG tablet Take 1 tablet (20 mg total) by mouth daily. 90 tablet 3 06/23/2015 at Unknown time  . Multiple Vitamin (MULTIVITAMIN WITH MINERALS) TABS tablet Take 1 tablet by mouth daily.   06/23/2015 at Unknown time   Previous Psychotropic Medications: Yes   Substance Abuse History in the last 12 months:  No.  Consequences of Substance Abuse: Medical Consequences:  Liver damage, Possible death by overdose Legal Consequences:  Arrests, jail time, Loss of driving privilege. Family Consequences:  Family discord, divorce and or separation.  Results for orders placed or performed during the hospital encounter of 06/24/15 (from the past 72 hour(s))  Comprehensive metabolic panel     Status: Abnormal   Collection Time: 06/24/15  7:28 AM  Result Value Ref Range   Sodium 139 135 - 145 mmol/L   Potassium 4.2 3.5 - 5.1 mmol/L   Chloride 107 101 - 111 mmol/L   CO2 28 22 - 32 mmol/L   Glucose, Bld 107 (H) 65 - 99 mg/dL   BUN 15 6 - 20 mg/dL   Creatinine, Ser 0.88 0.44 - 1.00 mg/dL   Calcium 10.2 8.9 - 10.3 mg/dL   Total Protein 7.3 6.5 - 8.1 g/dL   Albumin 4.1 3.5 - 5.0 g/dL   AST 20 15 - 41 U/L   ALT 14 14 - 54 U/L   Alkaline Phosphatase 89 38 - 126 U/L   Total Bilirubin 0.8 0.3 - 1.2 mg/dL   GFR calc non Af Amer >60 >60 mL/min   GFR calc Af Amer >60 >60 mL/min    Comment: (NOTE) The eGFR has been calculated using the CKD EPI equation. This calculation has not been validated in all clinical situations. eGFR's persistently <60 mL/min signify possible Chronic Kidney Disease.    Anion gap 4 (L) 5 - 15  CBC  with Differential/Platelet     Status: Abnormal   Collection Time: 06/24/15  7:28 AM  Result Value Ref Range   WBC 5.7 4.0 - 10.5 K/uL   RBC 4.88 3.87 - 5.11 MIL/uL   Hemoglobin 13.9 12.0 - 15.0 g/dL   HCT 43.2 36.0 - 46.0 %   MCV 88.5 78.0 - 100.0 fL   MCH 28.5 26.0 - 34.0 pg   MCHC 32.2 30.0 - 36.0 g/dL   RDW 13.2 11.5 - 15.5 %   Platelets 315 150 - 400 K/uL   Neutrophils Relative % 45 43 - 77 %   Neutro Abs 2.6 1.7 - 7.7 K/uL  Lymphocytes Relative 40 12 - 46 %   Lymphs Abs 2.3 0.7 - 4.0 K/uL   Monocytes Relative 10 3 - 12 %   Monocytes Absolute 0.5 0.1 - 1.0 K/uL   Eosinophils Relative 3 0 - 5 %   Eosinophils Absolute 0.2 0.0 - 0.7 K/uL   Basophils Relative 2 (H) 0 - 1 %   Basophils Absolute 0.1 0.0 - 0.1 K/uL  Ethanol     Status: None   Collection Time: 06/24/15  7:28 AM  Result Value Ref Range   Alcohol, Ethyl (B) <5 <5 mg/dL    Comment:        LOWEST DETECTABLE LIMIT FOR SERUM ALCOHOL IS 5 mg/dL FOR MEDICAL PURPOSES ONLY     Observation Level/Precautions:  15 minute checks  Laboratory:  Per ED  Psychotherapy: Group sessions  Medications: Sertraline 25 mg for depression, Trazodone 50 mg for insomnia, Lisinopril-HCTZ 20-12.5 mg for HTN.    Consultations: As needed   Discharge Concerns:  Safety, mood stability  Estimated LOS: 5-7 days  Other:     Psychological Evaluations: Yes   Treatment Plan Summary: Daily contact with patient to assess and evaluate symptoms and progress in treatment and Medication management:1. Admit for crisis management and stabilization, estimated length of stay 3-5 days.  2. Medication management to reduce current symptoms to base line and improve the patient's overall level of functioning; initiate Sertraline 25 mg for depression, Trazodone 50 mg for insomnia.  3. Treat health problems as indicated; resume Lisinopril-HCTZ 20-12.5 mg for HTN. 4. Develop treatment plan to decrease risk of relapse upon discharge and the need for readmission.   5. Psycho-social education regarding relapse prevention and self care.  6. Health care follow up as needed for medical problems.  7. Review, reconcile, and reinstate any pertinent home medications for other health issues where appropriate. 8. Call for consults with hospitalist for any additional specialty patient care services as needed.  Medical Decision Making:  New problem, with additional work up planned, Review of Psycho-Social Stressors (1), Review and summation of old records (2), Review of Medication Regimen & Side Effects (2) and Review of New Medication or Change in Dosage (2)  I certify that inpatient services furnished can reasonably be expected to improve the patient's condition.   Encarnacion Slates, PMHNP, FNP-BC 7/19/201610:02 AM I personally assessed the patient, reviewed the physical exam and labs and formulated the treatment plan Geralyn Flash A. Sabra Heck, M.D.

## 2015-06-26 ENCOUNTER — Encounter (HOSPITAL_COMMUNITY): Payer: Self-pay | Admitting: Registered Nurse

## 2015-06-26 MED ORDER — TRAZODONE HCL 50 MG PO TABS: 50.0000 mg | ORAL_TABLET | Freq: Every evening | ORAL | Status: DC | PRN

## 2015-06-26 MED ORDER — HYDROCHLOROTHIAZIDE 25 MG PO TABS: 25.0000 mg | ORAL_TABLET | Freq: Every day | ORAL | Status: DC

## 2015-06-26 MED ORDER — TRAZODONE HCL 50 MG PO TABS
50.0000 mg | ORAL_TABLET | Freq: Every evening | ORAL | Status: DC | PRN
  Filled 2015-06-26: qty 14

## 2015-06-26 MED ORDER — SERTRALINE HCL 25 MG PO TABS: 25.0000 mg | ORAL_TABLET | Freq: Every day | ORAL | Status: DC

## 2015-06-26 MED ORDER — LISINOPRIL 20 MG PO TABS: 20.0000 mg | ORAL_TABLET | Freq: Every day | ORAL | Status: DC

## 2015-06-26 NOTE — Progress Notes (Signed)
Patient ID: Erin Cunningham, female   DOB: 1941-01-04, 74 y.o.   MRN: [id number]  Pt. Denies SI/HI and A/V hallucinations. Belongings returned to patient at time of discharge. Patient denies any pain or discomfort. Discharge instructions and medications were reviewed with patient. Patient verbalized understanding of both medications and discharge instructions. Patient was driven by security to [facility] where her car was waiting for her. She left with no distress and she left in good spirits. Q15 minute safety checks maintained until discharge.

## 2015-06-26 NOTE — Discharge Summary (Signed)
Physician Discharge Summary Note  Patient:  Erin Cunningham is an 74 y.o., female MRN:  [id number] DOB:  [birth year] Patient phone:  [phone] (home)  Patient address:   [address] [place] [address],  Total Time spent with patient: 45 minutes  Date of Admission:  06/24/2015 Date of Discharge: 06/26/2015  Reason for Admission:  Per H&P Note:  Erin Cunningham is a 74 year old AA female. Admitted to [facility] as walk-in with complaints of both suicidal/homicidal ideations. She reports, "I'm not dealing with my church folks very well lately. There is a one particular usher at [employer] who does her best to get under my skin. This lady is very friendly with the other church members but me. She stands in front of the church to say hi to the incoming church members. She will great everyone else very well but me. I have to talk to her first. On Sunday, 2 days ago, my spirit started going down. I was sitting at the [employer]. I saw this church member doing something, I offered to help her with what she was doing. She had an attitude, it was a bad attitude. She was just rude to me. I got very upset. I could not get over it. That is when I started having this thought about hurting her badly, then myself. But, I got myself together & came to the hospital instead. I was seeing Dr. Adele Cunningham on the outpatient clinic of this hospital. I was on Zoloft, was doing very well, but stopped about 1 year ago. I saw Dr. Adele Cunningham about 1 year ago as well. I need to get back on my medicine & resume seeing Dr. Adele Cunningham again. I feel a lot better now. Can I go home"  Principal Problem: MDD (major depressive disorder), recurrent episode, severe Discharge Diagnoses: Patient Active Problem List   Diagnosis Date Noted  . Major depressive disorder, recurrent, severe without psychotic features [F33.2] 06/24/2015  . MDD (major depressive disorder), recurrent episode, severe [F33.2] 06/24/2015  . Suicidal ideation [R45.851]   . Onychomycosis  [B35.1] 06/12/2014  . Pain in lower limb [M79.606] 06/12/2014    Musculoskeletal: Strength & Muscle Tone: within normal limits Gait & Station: normal Patient leans: N/A  Psychiatric Specialty Exam:  See Suicide Risk Assessment Physical Exam  Nursing note and vitals reviewed. Constitutional: She is oriented to person, place, and time.  Neck: Normal range of motion.  Respiratory: Effort normal.  Musculoskeletal: Normal range of motion.  Neurological: She is alert and oriented to person, place, and time.    Review of Systems  Psychiatric/Behavioral: Negative for suicidal ideas and hallucinations. Depression: Stable. Nervous/anxious: Stable. Insomnia: Stable.     Blood pressure 134/77, pulse 78, temperature 98.6 F (37 C), temperature source Oral, resp. rate 18, height 5' 2.5" (1.588 m), weight 75.524 kg (166 lb 8 oz), SpO2 100 %.Body mass index is 29.95 kg/(m^2).  Have you used any form of tobacco in the last 30 days? (Cigarettes, Smokeless Tobacco, Cigars, and/or Pipes): No  Has this patient used any form of tobacco in the last 30 days? (Cigarettes, Smokeless Tobacco, Cigars, and/or Pipes) No  Past Medical History:  Past Medical History  Diagnosis Date  . Wears glasses   . Cancer   . Breast CA 2006    right [facility])  . Parotid mass 2014    right benign cyst  . History of shingles     Past Surgical History  Procedure Laterality Date  . Breast surgery  2004  rt br mass  . Eye surgery      both cataracts  . Breast lumpectomy with sentinel lymph node biopsy  2005    right  . Re-excision of breast lumpectomy  2005    right  . Abdominal hysterectomy  1982    BSO  . Cesarean section    . Colonoscopy    . Parotidectomy Right 09/05/2013    Procedure: RIGHT SUPERFICIAL PAROTIDECTOMY WITH FACIAL NERVE DISECTION;  Surgeon: Erin Nunnery, MD;  Location: [place];  Service: ENT;  Laterality: Right;  All documentation done by Erin Cunningham after [phone] --done under  Erin Cunningham's password   Family History:  Family History  Problem Relation Age of Onset  . Heart disease Mother     MI  . Cancer Father     bone   Social History:  History  Alcohol Use  . Yes    Comment: occ     History  Drug Use No    History   Social History  . Marital Status: Married    Spouse Name: N/A  . Number of Children: N/A  . Years of Education: N/A   Social History Main Topics  . Smoking status: Never Smoker   . Smokeless tobacco: Never Used  . Alcohol Use: Yes     Comment: occ  . Drug Use: No  . Sexual Activity: Not Currently   Other Topics Concern  . None   Social History Narrative    Risk to Self: Is patient at risk for suicide?: Yes What has been your use of drugs/alcohol within the last 12 months?: none Risk to Others:   Prior Inpatient Therapy:   Prior Outpatient Therapy:    Level of Care:  OP  Hospital Course:  Erin Cunningham was admitted for MDD (major depressive disorder), recurrent episode, severe and crisis management.  She was treated discharged with the medications listed below under Medication List.  Medical problems were identified and treated as needed.  Home medications were restarted as appropriate.  Improvement was monitored by observation and Erin Cunningham daily report of symptom reduction.  Emotional and mental status was monitored by daily self-inventory reports completed by Erin Cunningham and clinical staff.         Erin Cunningham was evaluated by the treatment team for stability and plans for continued recovery upon discharge.  Erin Cunningham motivation was an integral factor for scheduling further treatment.  Employment, transportation, bed availability, health status, family support, and any pending legal issues were also considered during her hospital stay.  She was offered further treatment options upon discharge including but not limited to Residential, Intensive Outpatient, and Outpatient treatment.  Erin Cunningham will  follow up with the services as listed below under Follow Up Information.     Upon completion of this admission the patient was both mentally and medically stable for discharge denying suicidal/homicidal ideation, auditory/visual/tactile hallucinations, delusional thoughts and paranoia.      Consults:  psychiatry  Significant Diagnostic Studies:  labs: Urinalysis, UDS, TTOH, CBC/Diff, CMET  Discharge Vitals:   Blood pressure 134/77, pulse 78, temperature 98.6 F (37 C), temperature source Oral, resp. rate 18, height 5' 2.5" (1.588 m), weight 75.524 kg (166 lb 8 oz), SpO2 100 %. Body mass index is 29.95 kg/(m^2). Lab Results:   Results for orders placed or performed during the hospital encounter of 06/24/15 (from the past 72 hour(s))  Comprehensive metabolic panel     Status:  Abnormal   Collection Time: 06/24/15  7:28 AM  Result Value Ref Range   Sodium 139 135 - 145 mmol/L   Potassium 4.2 3.5 - 5.1 mmol/L   Chloride 107 101 - 111 mmol/L   CO2 28 22 - 32 mmol/L   Glucose, Bld 107 (H) 65 - 99 mg/dL   BUN 15 6 - 20 mg/dL   Creatinine, Ser 0.88 0.44 - 1.00 mg/dL   Calcium 10.2 8.9 - 10.3 mg/dL   Total Protein 7.3 6.5 - 8.1 g/dL   Albumin 4.1 3.5 - 5.0 g/dL   AST 20 15 - 41 U/L   ALT 14 14 - 54 U/L   Alkaline Phosphatase 89 38 - 126 U/L   Total Bilirubin 0.8 0.3 - 1.2 mg/dL   GFR calc non Af Amer >60 >60 mL/min   GFR calc Af Amer >60 >60 mL/min    Comment: (NOTE) The eGFR has been calculated using the CKD EPI equation. This calculation has not been validated in all clinical situations. eGFR's persistently <60 mL/min signify possible Chronic Kidney Disease.    Anion gap 4 (L) 5 - 15  CBC with Differential/Platelet     Status: Abnormal   Collection Time: 06/24/15  7:28 AM  Result Value Ref Range   WBC 5.7 4.0 - 10.5 K/uL   RBC 4.88 3.87 - 5.11 MIL/uL   Hemoglobin 13.9 12.0 - 15.0 g/dL   HCT 43.2 36.0 - 46.0 %   MCV 88.5 78.0 - 100.0 fL   MCH 28.5 26.0 - 34.0 pg   MCHC 32.2  30.0 - 36.0 g/dL   RDW 13.2 11.5 - 15.5 %   Platelets 315 150 - 400 K/uL   Neutrophils Relative % 45 43 - 77 %   Neutro Abs 2.6 1.7 - 7.7 K/uL   Lymphocytes Relative 40 12 - 46 %   Lymphs Abs 2.3 0.7 - 4.0 K/uL   Monocytes Relative 10 3 - 12 %   Monocytes Absolute 0.5 0.1 - 1.0 K/uL   Eosinophils Relative 3 0 - 5 %   Eosinophils Absolute 0.2 0.0 - 0.7 K/uL   Basophils Relative 2 (H) 0 - 1 %   Basophils Absolute 0.1 0.0 - 0.1 K/uL  Ethanol     Status: None   Collection Time: 06/24/15  7:28 AM  Result Value Ref Range   Alcohol, Ethyl (B) <5 <5 mg/dL    Comment:        LOWEST DETECTABLE LIMIT FOR SERUM ALCOHOL IS 5 mg/dL FOR MEDICAL PURPOSES ONLY     Physical Findings: AIMS: Facial and Oral Movements Muscles of Facial Expression: None, normal Lips and Perioral Area: None, normal Jaw: None, normal Tongue: None, normal,Extremity Movements Upper (arms, wrists, hands, fingers): None, normal Lower (legs, knees, ankles, toes): None, normal, Trunk Movements Neck, shoulders, hips: None, normal, Overall Severity Severity of abnormal movements (highest score from questions above): None, normal Incapacitation due to abnormal movements: None, normal Patient's awareness of abnormal movements (rate only patient's report): No Awareness, Dental Status Current problems with teeth and/or dentures?: No Does patient usually wear dentures?: No  CIWA:  CIWA-Ar Total: 0 COWS:  COWS Total Score: 1   See Psychiatric Specialty Exam and Suicide Risk Assessment completed by Attending Physician prior to discharge.  Discharge destination:  Home  Is patient on multiple antipsychotic therapies at discharge:  No   Has Patient had three or more failed trials of antipsychotic monotherapy by history:  No    Recommended Plan  for Multiple Antipsychotic Therapies: NA      Discharge Instructions    Activity as tolerated - No restrictions    Complete by:  As directed      Diet - low sodium heart  healthy    Complete by:  As directed      Discharge instructions    Complete by:  As directed   Take all of you medications as prescribed by your mental healthcare provider.  Report any adverse effects and reactions from your medications to your outpatient provider promptly. Do not engage in alcohol and or illegal drug use while on prescription medicines. In the event of worsening symptoms call the crisis hotline, 911, and or go to the nearest emergency department for appropriate evaluation and treatment of symptoms. Follow-up with your primary care provider for your medical issues, concerns and or health care needs.   Keep all scheduled appointments.  If you are unable to keep an appointment call to reschedule.  Let the nurse know if you will need medications before next scheduled appointment.            Medication List    TAKE these medications      Indication   hydrochlorothiazide 25 MG tablet  Commonly known as:  HYDRODIURIL  Take 1 tablet (25 mg total) by mouth daily.   Indication:  High Blood Pressure     lisinopril 20 MG tablet  Commonly known as:  PRINIVIL,ZESTRIL  Take 1 tablet (20 mg total) by mouth daily.   Indication:  High Blood Pressure     multivitamin with minerals Tabs tablet  Take 1 tablet by mouth daily.      sertraline 25 MG tablet  Commonly known as:  ZOLOFT  Take 1 tablet (25 mg total) by mouth daily.   Indication:  Major Depressive Disorder     traZODone 50 MG tablet  Commonly known as:  DESYREL  Take 1 tablet (50 mg total) by mouth at bedtime as needed for sleep.   Indication:  Trouble Sleeping       Follow-up Information    Follow up with [facility] On 08/06/2015.   Why:  Appt w Dr Erin Cunningham on 8/30 at at 10:15.  Please bring your insurance card.  Please call to cancel or reschedule.   Contact information:   [place] [address] Phone:  [phone]      Follow up with [facility].   Specialty:   Professional Counselor   Why:  Please contact at discharge to inquire about ability to private pay for trauma counseling or to inquire about trauma support groups.   Contact information:   [facility] [place] [address] [phone]       Follow-up recommendations:  Activity:  As tolerated Diet:  Low Sodium  Comments:   Patient has been instructed to take medications as prescribed; and report adverse effects to outpatient provider.  Follow up with primary doctor for any medical issues and If symptoms recur report to nearest emergency or crisis hot line.    Total Discharge Time: 45 minutes  Signed: Earleen Newport, FNP-BC 06/26/2015, 4:39 PM  I personally assessed the patient and formulated the plan Geralyn Flash A. Sabra Heck, M.D.

## 2015-06-26 NOTE — BHH Suicide Risk Assessment (Addendum)
[facility] Discharge Suicide Risk Assessment   Demographic Factors:  NA  Total Time spent with patient: 30 minutes  Musculoskeletal: Strength & Muscle Tone: within normal limits Gait & Station: normal Patient leans: normal  Psychiatric Specialty Exam: Physical Exam  Review of Systems  Constitutional: Negative.   HENT: Negative.   Eyes: Negative.   Respiratory: Negative.   Cardiovascular: Negative.   Gastrointestinal: Negative.   Genitourinary: Negative.   Musculoskeletal: Negative.   Skin: Negative.   Neurological: Negative.   Endo/Heme/Allergies: Negative.   Psychiatric/Behavioral: Positive for depression. The patient is nervous/anxious.     Blood pressure 134/77, pulse 78, temperature 98.6 F (37 C), temperature source Oral, resp. rate 18, height 5' 2.5" (1.588 m), weight 75.524 kg (166 lb 8 oz), SpO2 100 %.Body mass index is 29.95 kg/(m^2).  General Appearance: Fairly Groomed  Engineer, water::  Fair  Speech:  Clear and [id number]  Volume:  Normal  Mood:  Euthymic  Affect:  Appropriate  Thought Process:  Coherent and Goal Directed  Orientation:  Full (Time, Place, and Person)  Thought Content:  plans as she moves on  Suicidal Thoughts:  No  Homicidal Thoughts:  No  Memory:  Immediate;   Fair Recent;   Fair Remote;   Fair  Judgement:  Fair  Insight:  Present  Psychomotor Activity:  Normal  Concentration:  Fair  Recall:  [employer] [facility] [place]  Language: Fair  Akathisia:  No  Handed:  Right  AIMS (if indicated):     Assets:  Communication Skills Desire for Improvement Housing Social Support  Sleep:  Number of Hours: 6.75  Cognition: WNL  ADL's:  Intact   Have you used any form of tobacco in the last 30 days? (Cigarettes, Smokeless Tobacco, Cigars, and/or Pipes): No  Has this patient used any form of tobacco in the last 30 days? (Cigarettes, Smokeless Tobacco, Cigars, and/or Pipes) No  Mental Status Per Nursing Assessment::   On Admission:  Suicidal  ideation indicated by patient (Had suicidal thoughts earlier in the day before admission)  Current Mental Status by Physician: In full contact with reality. There are no active SI plans or intent. She states she is committed to do better. She plans to pursue therapy. Her husband seems to be more mindful of the things he does himself and is planning to change.    Loss Factors: NA  Historical Factors: NA  Risk Reduction Factors:   Religious beliefs about death, Living with another person, especially a relative and Positive social support  Continued Clinical Symptoms:  Depression:   Insomnia  Cognitive Features That Contribute To Risk:  None    Suicide Risk:  Minimal: No identifiable suicidal ideation.  Patients presenting with no risk factors but with morbid ruminations; may be classified as minimal risk based on the severity of the depressive symptoms  Principal Problem: MDD (major depressive disorder), recurrent episode, severe Discharge Diagnoses:  Patient Active Problem List   Diagnosis Date Noted  . Major depressive disorder, recurrent, severe without psychotic features [F33.2] 06/24/2015  . MDD (major depressive disorder), recurrent episode, severe [F33.2] 06/24/2015  . Suicidal ideation [R45.851]   . Onychomycosis [B35.1] 06/12/2014  . Pain in lower limb [M79.606] 06/12/2014    Follow-up Information    Follow up with [facility] On 08/06/2015.   Why:  Appt w Dr Adele Schilder on 8/30 at at 10:15.  Please bring your insurance card.  Please call to cancel or reschedule.   Contact information:   [address] [place] [address] Phone:  [phone]      Plan Of Care/Follow-up recommendations:  Activity:  as tolerated Diet:  regular Follow up outpatient as above Is patient on multiple antipsychotic therapies at discharge:  No   Has Patient had three or more failed trials of antipsychotic monotherapy by history:  No  Recommended Plan for Multiple Antipsychotic  Therapies: NA    Alaynna Kerwood A 06/26/2015, 9:47 AM

## 2015-06-26 NOTE — Progress Notes (Signed)
Recreation Therapy Notes  Date: 07.20.16 Time: 9:30 am Location: [address] Group Room  Group Topic: Stress Management  Goal Area(s) Addresses:  Patient will verbalize importance of using healthy stress management.  Patient will identify positive emotions associated with healthy stress management.   Intervention: Stress Management  Activity :  Progressive Muscle Relaxation.  LRT will introduce and educate patients on the stress management technique of progressive muscle relaxation.  A script was used to present  the technique to the patients.  Patients were asked to follow a long with the script read a loud by the LRT.  Education:  Stress Management, Discharge Planning.   Education Outcome: Acknowledges edcuation/In group clarification offered/Needs additional education  Clinical Observations/Feedback: Patient did not attend group.   Victorino Sparrow, LRT/CTRS         Ria Comment, Lawan Nanez A 06/26/2015 10:15 AM

## 2015-06-26 NOTE — Progress Notes (Signed)
  [facility] Adult Case Management Discharge Plan :  Will you be returning to the same living situation after discharge:  Yes,  Patient plans to return home At discharge, do you have transportation home?: Yes,  patient reports access to transportation Do you have the ability to pay for your medications: Yes,  patient will be provided with prescriptions at discharge  Release of information consent forms completed and in the chart;  Patient's signature needed at discharge.  Patient to Follow up at: Follow-up Information    Follow up with [facility] On 08/06/2015.   Why:  Appt w Dr Adele Schilder on 8/30 at at 10:15.  Please bring your insurance card.  Please call to cancel or reschedule.   Contact information:   [place] [address] Phone:  [phone]      Follow up with [facility].   Specialty:  Professional Counselor   Why:  Please contact at discharge to inquire about ability to private pay for trauma counseling or to inquire about trauma support groups.   Contact information:   [facility] [place] [address] [phone]       Patient denies SI/HI: Yes,  denies    Safety Planning and Suicide Prevention discussed: Yes,  with patient   Have you used any form of tobacco in the last 30 days? (Cigarettes, Smokeless Tobacco, Cigars, and/or Pipes): No  Has patient been referred to the Quitline?: N/A patient is not a smoker  Erin Cunningham 06/26/2015, 10:05 AM

## 2015-06-26 NOTE — BHH Group Notes (Signed)
   [facility] LCSW Aftercare Discharge Planning Group Note  06/26/2015  8:45 AM   Participation Quality: Alert, Appropriate and Oriented  Mood/Affect: Appropriate  Depression Rating: 0  Anxiety Rating: 0  Thoughts of Suicide: Pt denies SI/HI  Will you contract for safety? Yes  Current AVH: Pt denies  Plan for Discharge/Comments: Pt attended discharge planning group and actively participated in group. CSW provided pt with today's workbook. Patient reported feeling "fantastic" today. She reports feeling ready for discharge home to follow up with outpatient services.  Transportation Means: Pt reports access to transportation  Supports: No supports mentioned at this time  Tilden Fossa, MSW, [place] Social Worker [employer] [phone]

## 2015-07-19 ENCOUNTER — Ambulatory Visit (INDEPENDENT_AMBULATORY_CARE_PROVIDER_SITE_OTHER): Payer: Medicare Other | Admitting: Internal Medicine

## 2015-07-19 ENCOUNTER — Encounter: Payer: Self-pay | Admitting: Internal Medicine

## 2015-07-19 VITALS — BP 130/80 | HR 59 | Temp 98.2°F | Resp 20 | Ht 63.0 in | Wt 165.2 lb

## 2015-07-19 DIAGNOSIS — I10 Essential (primary) hypertension: Secondary | ICD-10-CM | POA: Diagnosis not present

## 2015-07-19 DIAGNOSIS — F332 Major depressive disorder, recurrent severe without psychotic features: Secondary | ICD-10-CM

## 2015-07-19 NOTE — Progress Notes (Signed)
Patient ID: Erin Cunningham, female   DOB: June 19, 1941, 74 y.o.   MRN: [id number]    Location:    [facility]   Place of Service:   OFFICE  Chief Complaint  Patient presents with  . Medical Management of Chronic Issues    hospital follow-up    HPI:  74 yo female seen today as a hospital f/u. She took herself to the hospital due to severe depression. Her lytes, LFTs and blood counts were stable. She rec'd inpt psych for MDD with SI/HI and feels much better now. She was feeling overwhelmed prior to admission. Mood improved. No SI/HI. She is taking sertraline daily and prn trazodone  BP controlled on lisinopril and HCTZ. No HA or dizziness. No CP or SOB  Past Medical History  Diagnosis Date  . Wears glasses   . Cancer   . Breast CA 2006    right [facility])  . Parotid mass 2014    right benign cyst  . History of shingles     Past Surgical History  Procedure Laterality Date  . Breast surgery  2004    rt br mass  . Eye surgery      both cataracts  . Breast lumpectomy with sentinel lymph node biopsy  2005    right  . Re-excision of breast lumpectomy  2005    right  . Abdominal hysterectomy  1982    BSO  . Cesarean section    . Colonoscopy    . Parotidectomy Right 09/05/2013    Procedure: RIGHT SUPERFICIAL PAROTIDECTOMY WITH FACIAL NERVE DISECTION;  Surgeon: [name], MD;  Location: [place];  Service: ENT;  Laterality: Right;  All documentation done by R.Ward after [phone] --done under [facility] password    Patient Care Team: Gildardo Cranker, DO as PCP - General (Internal Medicine)  Social History   Social History  . Marital Status: Married    Spouse Name: N/A  . Number of Children: N/A  . Years of Education: N/A   Occupational History  . Not on file.   Social History Main Topics  . Smoking status: Never Smoker   . Smokeless tobacco: Never Used  . Alcohol Use: Yes     Comment: occ  . Drug Use: No  . Sexual Activity: Not Currently   Other  Topics Concern  . Not on file   Social History Narrative     reports that she has never smoked. She has never used smokeless tobacco. She reports that she drinks alcohol. She reports that she does not use illicit drugs.  No Known Allergies  Medications: Patient's Medications  New Prescriptions   No medications on file  Previous Medications   HYDROCHLOROTHIAZIDE (HYDRODIURIL) 25 MG TABLET    Take 1 tablet (25 mg total) by mouth daily.   LISINOPRIL (PRINIVIL,ZESTRIL) 20 MG TABLET    Take 1 tablet (20 mg total) by mouth daily.   MULTIPLE VITAMIN (MULTIVITAMIN WITH MINERALS) TABS TABLET    Take 1 tablet by mouth daily.   SERTRALINE (ZOLOFT) 25 MG TABLET    Take 1 tablet (25 mg total) by mouth daily.   TRAZODONE (DESYREL) 50 MG TABLET    Take 1 tablet (50 mg total) by mouth at bedtime as needed for sleep.  Modified Medications   No medications on file  Discontinued Medications   No medications on file    Review of Systems  Constitutional: Negative for fever, chills, diaphoresis, activity change, appetite change and fatigue.  HENT: Negative for ear pain and sore throat.   Eyes: Negative for visual disturbance.  Respiratory: Negative for cough, chest tightness and shortness of breath.   Cardiovascular: Negative for chest pain, palpitations and leg swelling.  Gastrointestinal: Negative for nausea, vomiting, abdominal pain, diarrhea, constipation and blood in stool.  Genitourinary: Negative for dysuria.  Musculoskeletal: Negative for arthralgias.  Neurological: Negative for dizziness, tremors, numbness and headaches.  Psychiatric/Behavioral: Negative for suicidal ideas, hallucinations, behavioral problems, confusion, sleep disturbance, self-injury, dysphoric mood and agitation. The patient is not nervous/anxious.     Filed Vitals:   07/19/15 1141  BP: 130/80  Pulse: 59  Temp: 98.2 F (36.8 C)  TempSrc: Oral  Resp: 20  Height: _0  (1.6 m)  Weight: 165 lb 3.2 oz (74.934 kg)    SpO2: 97%   Body mass index is 29.27 kg/(m^2).  Physical Exam  Constitutional: She is oriented to person, place, and time. She appears well-developed and well-nourished.  HENT:  Mouth/Throat: Oropharynx is clear and moist. No oropharyngeal exudate.  Eyes: Pupils are equal, round, and reactive to light. No scleral icterus.  Neck: Neck supple. Carotid bruit is not present. No tracheal deviation present.  Cardiovascular: Normal rate, regular rhythm, normal heart sounds and intact distal pulses.  Exam reveals no gallop and no friction rub.   No murmur heard. No LE edema b/l. no calf TTP.   Pulmonary/Chest: Effort normal and breath sounds normal. No stridor. No respiratory distress. She has no wheezes. She has no rales.  Abdominal: Soft. Bowel sounds are normal. She exhibits no distension and no mass. There is no hepatomegaly. There is no tenderness. There is no rebound and no guarding.  Lymphadenopathy:    She has no cervical adenopathy.  Neurological: She is alert and oriented to person, place, and time.  Skin: Skin is warm and dry. No rash noted.  Psychiatric: She has a normal mood and affect. Her behavior is normal. Judgment and thought content normal.     Labs reviewed: Admission on 06/24/2015, Discharged on 06/24/2015  Component Date Value Ref Range Status  . Sodium 06/24/2015 139  135 - 145 mmol/L Final  . Potassium 06/24/2015 4.2  3.5 - 5.1 mmol/L Final  . Chloride 06/24/2015 107  101 - 111 mmol/L Final  . CO2 06/24/2015 28  22 - 32 mmol/L Final  . Glucose, Bld 06/24/2015 107* 65 - 99 mg/dL Final  . BUN 06/24/2015 15  6 - 20 mg/dL Final  . Creatinine, Ser 06/24/2015 0.88  0.44 - 1.00 mg/dL Final  . Calcium 06/24/2015 10.2  8.9 - 10.3 mg/dL Final  . Total Protein 06/24/2015 7.3  6.5 - 8.1 g/dL Final  . Albumin 06/24/2015 4.1  3.5 - 5.0 g/dL Final  . AST 06/24/2015 20  15 - 41 U/L Final  . ALT 06/24/2015 14  14 - 54 U/L Final  . Alkaline Phosphatase 06/24/2015 89  38 - 126  U/L Final  . Total Bilirubin 06/24/2015 0.8  0.3 - 1.2 mg/dL Final  . GFR calc non Af Amer 06/24/2015 >60  >60 mL/min Final  . GFR calc Af Amer 06/24/2015 >60  >60 mL/min Final   Comment: (NOTE) The eGFR has been calculated using the CKD EPI equation. This calculation has not been validated in all clinical situations. eGFR's persistently <60 mL/min signify possible Chronic Kidney Disease.   . Anion gap 06/24/2015 4* 5 - 15 Final  . WBC 06/24/2015 5.7  4.0 - 10.5 K/uL Final  . RBC 06/24/2015  4.88  3.87 - 5.11 MIL/uL Final  . Hemoglobin 06/24/2015 13.9  12.0 - 15.0 g/dL Final  . HCT 06/24/2015 43.2  36.0 - 46.0 % Final  . MCV 06/24/2015 88.5  78.0 - 100.0 fL Final  . MCH 06/24/2015 28.5  26.0 - 34.0 pg Final  . MCHC 06/24/2015 32.2  30.0 - 36.0 g/dL Final  . RDW 06/24/2015 13.2  11.5 - 15.5 % Final  . Platelets 06/24/2015 315  150 - 400 K/uL Final  . Neutrophils Relative % 06/24/2015 45  43 - 77 % Final  . Neutro Abs 06/24/2015 2.6  1.7 - 7.7 K/uL Final  . Lymphocytes Relative 06/24/2015 40  12 - 46 % Final  . Lymphs Abs 06/24/2015 2.3  0.7 - 4.0 K/uL Final  . Monocytes Relative 06/24/2015 10  3 - 12 % Final  . Monocytes Absolute 06/24/2015 0.5  0.1 - 1.0 K/uL Final  . Eosinophils Relative 06/24/2015 3  0 - 5 % Final  . Eosinophils Absolute 06/24/2015 0.2  0.0 - 0.7 K/uL Final  . Basophils Relative 06/24/2015 2* 0 - 1 % Final  . Basophils Absolute 06/24/2015 0.1  0.0 - 0.1 K/uL Final  . Alcohol, Ethyl (B) 06/24/2015 <5  <5 mg/dL Final   Comment:        LOWEST DETECTABLE LIMIT FOR SERUM ALCOHOL IS 5 mg/dL FOR MEDICAL PURPOSES ONLY      Assessment/Plan   ICD-9-CM ICD-10-CM   1. Major depressive disorder, recurrent, severe without psychotic features - stable 296.33 F33.2   2. Essential hypertension, benign - controlled 401.1 I10    Continue current medications as ordered  Recommend o/p counseling   Follow up in 3 mos for routine visit  Keishawn Darsey S. Perlie Gold  [facility] [address] [place],  [address] [phone] Cell (Monday-Friday 8 AM - 5 PM) [phone] After 5 PM and follow prompts

## 2015-07-21 ENCOUNTER — Encounter: Payer: Self-pay | Admitting: Internal Medicine

## 2015-07-21 DIAGNOSIS — I10 Essential (primary) hypertension: Secondary | ICD-10-CM | POA: Insufficient documentation

## 2015-07-21 DIAGNOSIS — Z853 Personal history of malignant neoplasm of breast: Secondary | ICD-10-CM | POA: Insufficient documentation

## 2015-07-21 DIAGNOSIS — E785 Hyperlipidemia, unspecified: Secondary | ICD-10-CM | POA: Insufficient documentation

## 2015-07-21 DIAGNOSIS — R7301 Impaired fasting glucose: Secondary | ICD-10-CM | POA: Insufficient documentation

## 2015-07-21 NOTE — Patient Instructions (Signed)
Continue current medications as ordered  Follow up in 3 mos for routine visit

## 2015-08-06 ENCOUNTER — Ambulatory Visit (HOSPITAL_COMMUNITY): Payer: Self-pay | Admitting: Psychiatry

## 2015-09-16 ENCOUNTER — Ambulatory Visit (HOSPITAL_COMMUNITY): Payer: Self-pay | Admitting: Psychiatry

## 2015-10-23 ENCOUNTER — Ambulatory Visit: Payer: Medicare Other | Admitting: Internal Medicine

## 2015-10-28 ENCOUNTER — Ambulatory Visit (HOSPITAL_COMMUNITY): Payer: Self-pay | Admitting: Psychiatry

## 2015-10-29 ENCOUNTER — Ambulatory Visit (INDEPENDENT_AMBULATORY_CARE_PROVIDER_SITE_OTHER): Payer: Medicare Other | Admitting: Psychiatry

## 2015-10-29 ENCOUNTER — Encounter (HOSPITAL_COMMUNITY): Payer: Self-pay | Admitting: Psychiatry

## 2015-10-29 ENCOUNTER — Encounter (INDEPENDENT_AMBULATORY_CARE_PROVIDER_SITE_OTHER): Payer: Self-pay

## 2015-10-29 VITALS — BP 138/74 | HR 66 | Ht 63.0 in | Wt 164.0 lb

## 2015-10-29 DIAGNOSIS — F332 Major depressive disorder, recurrent severe without psychotic features: Secondary | ICD-10-CM | POA: Diagnosis not present

## 2015-10-29 MED ORDER — TRAZODONE HCL 50 MG PO TABS: 50.0000 mg | ORAL_TABLET | Freq: Every day | ORAL | Status: DC

## 2015-10-29 MED ORDER — SERTRALINE HCL 50 MG PO TABS: 50.0000 mg | ORAL_TABLET | Freq: Every day | ORAL | Status: DC
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
## 2015-11-06 ENCOUNTER — Encounter: Payer: Self-pay | Admitting: Internal Medicine

## 2015-11-06 ENCOUNTER — Ambulatory Visit (INDEPENDENT_AMBULATORY_CARE_PROVIDER_SITE_OTHER): Payer: Medicare Other | Admitting: Internal Medicine

## 2015-11-06 VITALS — BP 120/80 | HR 90 | Temp 98.0°F | Resp 18 | Ht 63.0 in | Wt 162.0 lb

## 2015-11-06 DIAGNOSIS — R7309 Other abnormal glucose: Secondary | ICD-10-CM | POA: Diagnosis not present

## 2015-11-06 DIAGNOSIS — Z23 Encounter for immunization: Secondary | ICD-10-CM

## 2015-11-06 DIAGNOSIS — E785 Hyperlipidemia, unspecified: Secondary | ICD-10-CM

## 2015-11-06 DIAGNOSIS — F332 Major depressive disorder, recurrent severe without psychotic features: Secondary | ICD-10-CM

## 2015-11-06 DIAGNOSIS — I10 Essential (primary) hypertension: Secondary | ICD-10-CM

## 2015-11-06 DIAGNOSIS — R7301 Impaired fasting glucose: Secondary | ICD-10-CM

## 2015-11-06 NOTE — Progress Notes (Signed)
Patient ID: Erin Cunningham, female   DOB: [birth year], 74 y.o.   MRN: [id number]    Location:    [facility]   Place of Service:  OFFICE   Chief Complaint  Patient presents with  . Medical Management of Chronic Issues    3 month follow-up for Major Depressiv Disorder, Hypertension  . Immunizations    Will do flu shot today    HPI:  74 yo female seen today for f/u. 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  BP controlled on lisinopril and HCTZ. No HA or dizziness. No CP or SOB  Depression - stable. She is followed by psych  Past Medical History  Diagnosis Date  . Wears glasses   . Cancer ([place])   . Breast CA ([place]) 2006    right [facility])  . Parotid mass 2014    right benign cyst  . History of shingles   . Major depressive disorder, recurrent, severe without psychotic features ([place]) 06/24/2015    Past Surgical History  Procedure Laterality Date  . Breast surgery  2004    rt br mass  . Eye surgery      both cataracts  . Breast lumpectomy with sentinel lymph node biopsy  2005    right  . Re-excision of breast lumpectomy  2005    right  . Abdominal hysterectomy  1982    BSO  . Cesarean section    . Colonoscopy    . Parotidectomy Right 09/05/2013    Procedure: RIGHT SUPERFICIAL PAROTIDECTOMY WITH FACIAL NERVE DISECTION;  Surgeon: [name], MD;  Location: [facility];  Service: ENT;  Laterality: Right;  All documentation done by R.Ward after [phone] --done under [facility] password    Patient Care Team: Gildardo Cranker, DO as PCP - General (Internal Medicine)  Social History   Social History  . Marital Status: Married    Spouse Name: N/A  . Number of Children: N/A  . Years of Education: N/A   Occupational History  . Not on file.   Social History Main Topics  . Smoking status: Never Smoker   . Smokeless tobacco: Never Used  . Alcohol Use: Yes     Comment: occ  . Drug Use: No  . Sexual  Activity: Not Currently   Other Topics Concern  . Not on file   Social History Narrative     reports that she has never smoked. She has never used smokeless tobacco. She reports that she drinks alcohol. She reports that she does not use illicit drugs.  No Known Allergies  Medications: Patient's Medications  New Prescriptions   No medications on file  Previous Medications   HYDROCHLOROTHIAZIDE (HYDRODIURIL) 25 MG TABLET    Take 1 tablet (25 mg total) by mouth daily.   LISINOPRIL (PRINIVIL,ZESTRIL) 20 MG TABLET    Take 1 tablet (20 mg total) by mouth daily.   MULTIPLE VITAMIN (MULTIVITAMIN WITH MINERALS) TABS TABLET    Take 1 tablet by mouth daily.   SERTRALINE (ZOLOFT) 50 MG TABLET    Take 1 tablet (50 mg total) by mouth daily.   TRAZODONE (DESYREL) 50 MG TABLET    Take 1 tablet (50 mg total) by mouth at bedtime.  Modified Medications   No medications on file  Discontinued Medications   No medications on file    Review of Systems  Constitutional: Negative for fever, chills, diaphoresis, activity change, appetite change and fatigue.  HENT: Negative for ear pain and sore throat.   Eyes: Negative for visual disturbance.  Respiratory: Negative for cough, chest tightness and shortness of breath.   Cardiovascular: Negative for chest pain, palpitations and leg swelling.  Gastrointestinal: Negative for nausea, vomiting, abdominal pain, diarrhea, constipation and blood in stool.  Genitourinary: Negative for dysuria.  Musculoskeletal: Negative for arthralgias.  Neurological: Negative for dizziness, tremors, numbness and headaches.  Psychiatric/Behavioral: Negative for sleep disturbance. The patient is not nervous/anxious.     Filed Vitals:   11/06/15 1540  BP: 120/80  Pulse: 90  Temp: 98 F (36.7 C)  TempSrc: Oral  Resp: 18  Height: _0  (1.6 m)  Weight: 162 lb (73.483 kg)  SpO2: 98%   Body mass index is 28.7 kg/(m^2).  Physical Exam  Constitutional: She is oriented to  person, place, and time. She appears well-developed and well-nourished.  HENT:  Mouth/Throat: Oropharynx is clear and moist. No oropharyngeal exudate.  Eyes: Pupils are equal, round, and reactive to light. No scleral icterus.  Neck: Neck supple. Carotid bruit is not present. No tracheal deviation present. No thyromegaly present.  Cardiovascular: Normal rate, regular rhythm, normal heart sounds and intact distal pulses.  Exam reveals no gallop and no friction rub.   No murmur heard. No LE edema b/l. no calf TTP.   Pulmonary/Chest: Effort normal and breath sounds normal. No stridor. No respiratory distress. She has no wheezes. She has no rales.  Abdominal: Soft. Bowel sounds are normal. She exhibits no distension and no mass. There is no hepatomegaly. There is no tenderness. There is no rebound and no guarding.  Lymphadenopathy:    She has no cervical adenopathy.  Neurological: She is alert and oriented to person, place, and time.  Skin: Skin is warm and dry. No rash noted.  Psychiatric: She has a normal mood and affect. Her behavior is normal. Judgment and thought content normal.     Labs reviewed: No visits with results within 3 Month(s) from this visit. Latest known visit with results is:  Admission on 06/24/2015, Discharged on 06/24/2015  Component Date Value Ref Range Status  . Sodium 06/24/2015 139  135 - 145 mmol/L Final  . Potassium 06/24/2015 4.2  3.5 - 5.1 mmol/L Final  . Chloride 06/24/2015 107  101 - 111 mmol/L Final  . CO2 06/24/2015 28  22 - 32 mmol/L Final  . Glucose, Bld 06/24/2015 107* 65 - 99 mg/dL Final  . BUN 06/24/2015 15  6 - 20 mg/dL Final  . Creatinine, Ser 06/24/2015 0.88  0.44 - 1.00 mg/dL Final  . Calcium 06/24/2015 10.2  8.9 - 10.3 mg/dL Final  . Total Protein 06/24/2015 7.3  6.5 - 8.1 g/dL Final  . Albumin 06/24/2015 4.1  3.5 - 5.0 g/dL Final  . AST 06/24/2015 20  15 - 41 U/L Final  . ALT 06/24/2015 14  14 - 54 U/L Final  . Alkaline Phosphatase  06/24/2015 89  38 - 126 U/L Final  . Total Bilirubin 06/24/2015 0.8  0.3 - 1.2 mg/dL Final  . GFR calc non Af Amer 06/24/2015 >60  >60 mL/min Final  . GFR calc Af Amer 06/24/2015 >60  >60 mL/min Final   Comment: (NOTE) The eGFR has been calculated using the CKD EPI equation. This calculation has not been validated in all clinical situations. eGFR's persistently <60 mL/min signify possible Chronic Kidney Disease.   . Anion gap 06/24/2015 4* 5 - 15 Final  . WBC 06/24/2015 5.7  4.0 - 10.5  K/uL Final  . RBC 06/24/2015 4.88  3.87 - 5.11 MIL/uL Final  . Hemoglobin 06/24/2015 13.9  12.0 - 15.0 g/dL Final  . HCT 06/24/2015 43.2  36.0 - 46.0 % Final  . MCV 06/24/2015 88.5  78.0 - 100.0 fL Final  . MCH 06/24/2015 28.5  26.0 - 34.0 pg Final  . MCHC 06/24/2015 32.2  30.0 - 36.0 g/dL Final  . RDW 06/24/2015 13.2  11.5 - 15.5 % Final  . Platelets 06/24/2015 315  150 - 400 K/uL Final  . Neutrophils Relative % 06/24/2015 45  43 - 77 % Final  . Neutro Abs 06/24/2015 2.6  1.7 - 7.7 K/uL Final  . Lymphocytes Relative 06/24/2015 40  12 - 46 % Final  . Lymphs Abs 06/24/2015 2.3  0.7 - 4.0 K/uL Final  . Monocytes Relative 06/24/2015 10  3 - 12 % Final  . Monocytes Absolute 06/24/2015 0.5  0.1 - 1.0 K/uL Final  . Eosinophils Relative 06/24/2015 3  0 - 5 % Final  . Eosinophils Absolute 06/24/2015 0.2  0.0 - 0.7 K/uL Final  . Basophils Relative 06/24/2015 2* 0 - 1 % Final  . Basophils Absolute 06/24/2015 0.1  0.0 - 0.1 K/uL Final  . Alcohol, Ethyl (B) 06/24/2015 <5  <5 mg/dL Final   Comment:        LOWEST DETECTABLE LIMIT FOR SERUM ALCOHOL IS 5 mg/dL FOR MEDICAL PURPOSES ONLY     No results found.   Assessment/Plan   ICD-9-CM ICD-10-CM   1. Essential hypertension, benign - stable 401.1 I10   2. Major depressive disorder, recurrent, severe without psychotic features ([place]) - stable 296.33 F33.2   3. Hyperlipidemia LDL goal <130 - stable 272.4 E78.5   4. Abnormal fasting glucose - diet controlled  790.29 R73.09    Flu shot given today  Continue current medications as ordered  Fasting labs in Jan 2017  Follow up in 3 mos for routine visit  Keep appt with specialist   Gaurav Baldree S. Perlie Gold  [facility] [address] [place], [place] [address] [phone] Cell (Monday-Friday 8 AM - 5 PM) [phone] After 5 PM and follow prompts

## 2015-11-06 NOTE — Patient Instructions (Signed)
Continue current medications as ordered  Fasting labs in Jan 2017  Follow up in 3 mos for routine visit  Keep appt with specialist

## 2015-11-18 ENCOUNTER — Telehealth: Payer: Self-pay | Admitting: Internal Medicine

## 2015-11-18 NOTE — Telephone Encounter (Signed)
Received form for [employer] Examination Certificate. Filled out and given to Dr. Eulas Post to review and sign. Patient would like it faxed to [facility] at Fax#: [phone]

## 2015-11-18 NOTE — Telephone Encounter (Signed)
[employer] dropped off a physcial form to be filled out by Dr. Eulas Post. The form was placed in the rx tray.

## 2015-12-03 ENCOUNTER — Ambulatory Visit: Payer: Self-pay

## 2015-12-10 ENCOUNTER — Ambulatory Visit (INDEPENDENT_AMBULATORY_CARE_PROVIDER_SITE_OTHER): Payer: Medicare Other

## 2015-12-10 DIAGNOSIS — Z23 Encounter for immunization: Secondary | ICD-10-CM | POA: Diagnosis not present

## 2015-12-12 ENCOUNTER — Ambulatory Visit: Payer: Medicare Other

## 2015-12-12 ENCOUNTER — Other Ambulatory Visit: Payer: Medicare Other

## 2015-12-12 LAB — TB SKIN TEST
INDURATION: 4 mm
TB Skin Test: NEGATIVE

## 2015-12-13 ENCOUNTER — Other Ambulatory Visit: Payer: Medicare Other

## 2015-12-17 ENCOUNTER — Telehealth (HOSPITAL_COMMUNITY): Payer: Self-pay

## 2015-12-17 DIAGNOSIS — F332 Major depressive disorder, recurrent severe without psychotic features: Secondary | ICD-10-CM

## 2015-12-17 MED ORDER — SERTRALINE HCL 50 MG PO TABS: 50.0000 mg | ORAL_TABLET | Freq: Every day | ORAL | Status: DC

## 2015-12-17 NOTE — Telephone Encounter (Signed)
Yes please renew the prescription thank you

## 2015-12-17 NOTE — Telephone Encounter (Signed)
Telephone call with patient to follow up on message she left she is in need of a new Zoloft order.  Patient reported she has lost prescription from 10/29/15 and requested a refill be called into her [place] on [facility].  Patient reported she had plenty of Trazodone but just needed a new Zoloft order.  Patient reported she may have to change the time of her 12/30/15 appointment and agreed to call back to our front reception to change if this was the case.  Agreed to request new order from Dr. Salem Senate and will call patient back once discussed.

## 2015-12-17 NOTE — Addendum Note (Signed)
Addended by: Watt Climes on: 12/17/2015 05:32 PM   Modules accepted: Orders

## 2015-12-17 NOTE — Telephone Encounter (Addendum)
A new Zoloft 50 mg, one a day order, #30 with no refills e-scribed to patient's [place] on [facility] as requested and called patient to inform a new one time order had been sent into her [facility].  Patient then reported finding old prescription in some papers in her house but stated she went ahead and shredded it since she knew we were sending in a new one. Patient apologized for mix up and states would see [place] on 12/30/15.

## 2015-12-20 ENCOUNTER — Other Ambulatory Visit: Payer: Medicare Other

## 2015-12-30 ENCOUNTER — Ambulatory Visit (HOSPITAL_COMMUNITY): Payer: Self-pay | Admitting: Psychiatry

## 2016-01-02 ENCOUNTER — Ambulatory Visit (INDEPENDENT_AMBULATORY_CARE_PROVIDER_SITE_OTHER): Payer: Medicare Other | Admitting: Psychiatry

## 2016-01-02 ENCOUNTER — Encounter (HOSPITAL_COMMUNITY): Payer: Self-pay | Admitting: Psychiatry

## 2016-01-02 VITALS — BP 129/84 | HR 87 | Ht 63.0 in | Wt 161.8 lb

## 2016-01-02 DIAGNOSIS — F332 Major depressive disorder, recurrent severe without psychotic features: Secondary | ICD-10-CM | POA: Diagnosis not present

## 2016-01-02 MED ORDER — SERTRALINE HCL 50 MG PO TABS: 50.0000 mg | ORAL_TABLET | Freq: Every day | ORAL | Status: DC
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
## 2016-01-09 ENCOUNTER — Ambulatory Visit (INDEPENDENT_AMBULATORY_CARE_PROVIDER_SITE_OTHER): Payer: Medicare Other | Admitting: Internal Medicine

## 2016-01-09 VITALS — HR 60 | Temp 98.1°F | Resp 18 | Ht 63.0 in | Wt 161.0 lb

## 2016-01-09 DIAGNOSIS — J0110 Acute frontal sinusitis, unspecified: Secondary | ICD-10-CM | POA: Diagnosis not present

## 2016-01-09 DIAGNOSIS — R6884 Jaw pain: Secondary | ICD-10-CM | POA: Diagnosis not present

## 2016-01-09 DIAGNOSIS — R599 Enlarged lymph nodes, unspecified: Secondary | ICD-10-CM | POA: Diagnosis not present

## 2016-01-09 DIAGNOSIS — R590 Localized enlarged lymph nodes: Secondary | ICD-10-CM

## 2016-01-09 MED ORDER — AMOXICILLIN-POT CLAVULANATE 875-125 MG PO TABS: 1.0000 | ORAL_TABLET | Freq: Two times a day (BID) | ORAL | Status: DC

## 2016-01-09 MED ORDER — HYDROCHLOROTHIAZIDE 25 MG PO TABS: 25.0000 mg | ORAL_TABLET | Freq: Every day | ORAL | Status: DC

## 2016-01-09 MED ORDER — LISINOPRIL 20 MG PO TABS: 20.0000 mg | ORAL_TABLET | Freq: Every day | ORAL | Status: DC

## 2016-01-09 NOTE — Patient Instructions (Addendum)
Sinusitis, Adult Sinusitis is redness, soreness, and inflammation of the paranasal sinuses. Paranasal sinuses are air pockets within the bones of your face. They are located beneath your eyes, in the middle of your forehead, and above your eyes. In healthy paranasal sinuses, mucus is able to drain out, and air is able to circulate through them by way of your nose. However, when your paranasal sinuses are inflamed, mucus and air can become trapped. This can allow bacteria and other germs to grow and cause infection. Sinusitis can develop quickly and last only a short time (acute) or continue over a long period (chronic). Sinusitis that lasts for more than 12 weeks is considered chronic. CAUSES Causes of sinusitis include:  Allergies.  Structural abnormalities, such as displacement of the cartilage that separates your nostrils (deviated septum), which can decrease the air flow through your nose and sinuses and affect sinus drainage.  Functional abnormalities, such as when the small hairs (cilia) that line your sinuses and help remove mucus do not work properly or are not present. SIGNS AND SYMPTOMS Symptoms of acute and chronic sinusitis are the same. The primary symptoms are pain and pressure around the affected sinuses. Other symptoms include:  Upper toothache.  Earache.  Headache.  Bad breath.  Decreased sense of smell and taste.  A cough, which worsens when you are lying flat.  Fatigue.  Fever.  Thick drainage from your nose, which often is green and may contain pus (purulent).  Swelling and warmth over the affected sinuses. DIAGNOSIS Your health care provider will perform a physical exam. During your exam, your health care provider may perform any of the following to help determine if you have acute sinusitis or chronic sinusitis:  Look in your nose for signs of abnormal growths in your nostrils (nasal polyps).  Tap over the affected sinus to check for signs of  infection.  View the inside of your sinuses using an imaging device that has a light attached (endoscope). If your health care provider suspects that you have chronic sinusitis, one or more of the following tests may be recommended:  Allergy tests.  Nasal culture. A sample of mucus is taken from your nose, sent to a lab, and screened for bacteria.  Nasal cytology. A sample of mucus is taken from your nose and examined by your health care provider to determine if your sinusitis is related to an allergy. TREATMENT Most cases of acute sinusitis are related to a viral infection and will resolve on their own within 10 days. Sometimes, medicines are prescribed to help relieve symptoms of both acute and chronic sinusitis. These may include pain medicines, decongestants, nasal steroid sprays, or saline sprays. However, for sinusitis related to a bacterial infection, your health care provider will prescribe antibiotic medicines. These are medicines that will help kill the bacteria causing the infection. Rarely, sinusitis is caused by a fungal infection. In these cases, your health care provider will prescribe antifungal medicine. For some cases of chronic sinusitis, surgery is needed. Generally, these are cases in which sinusitis recurs more than 3 times per year, despite other treatments. HOME CARE INSTRUCTIONS  Drink plenty of water. Water helps thin the mucus so your sinuses can drain more easily.  Use a humidifier.  Inhale steam 3-4 times a day (for example, sit in the bathroom with the shower running).  Apply a warm, moist washcloth to your face 3-4 times a day, or as directed by your health care provider.  Use saline nasal sprays to help   moisten and clean your sinuses.  Take medicines only as directed by your health care provider.  If you were prescribed either an antibiotic or antifungal medicine, finish it all even if you start to feel better. SEEK IMMEDIATE MEDICAL CARE IF:  You have  increasing pain or severe headaches.  You have nausea, vomiting, or drowsiness.  You have swelling around your face.  You have vision problems.  You have a stiff neck.  You have difficulty breathing.   This information is not intended to replace advice given to you by your health care provider. Make sure you discuss any questions you have with your health care provider.   Document Released: 11/23/2005 Document Revised: 12/14/2014 Document Reviewed: 12/08/2011 Elsevier Interactive Patient Education [employer].  If you are not feeling better after 2 weeks, call back to see Dr. Eulas Post about your jaw pain.

## 2016-01-09 NOTE — Progress Notes (Signed)
Patient ID: Erin Cunningham, female   DOB: October 18, 1941, 75 y.o.   MRN: [id number]   Location: [place] Provider: Rexene Edison. Mariea Clonts, D.O., C.M.D.   Goals of Care: Advanced Directive information Advanced Directives 11/06/2015  Does patient have an advance directive? No  Would patient like information on creating an advanced directive? Yes - Educational materials given  Some encounter information is confidential and restricted. Go to Review Flowsheets activity to see all data.     Chief Complaint  Patient presents with  . Acute Visit    cough, mouth and ear pain    HPI: Patient is a 75 y.o. female seen in the office today for an acute visit for cough, mouth, and ear pain.  Going on about 6 wks ago.  Mouth partially open , tired to open mouth further, pain terrible in left side of jaw.  Got up and went into bathroom with toothbrush and tried to open mouth and could not.  Ut peroxide in her mouth.  Did not tell anyone.  As day went on, could gradually open mouth more.  Still can't chew on left side.  Has been going to work, gradual improvement.  Thought wisdom tooth.  Went to oral surgeon--no problem there.  Pain never went away.  Now ear aching down into her neck.  Still gargling and using salt water.  When lays down at night, has loads of mucus, get hot water and gargles, but cough not going away.  She is worried and was looking it up.  Had right breast cancer in 2006, and was reading on line and now Dr. Lucia Gaskins removed a cyst on the right and had no problems.  Feels like something is in the way in her throat.  Initially she thought she had too much congestion, took mucus pills, but still a problem.  Says not tender on the outside.  She does chew tobacco.    Has not had her bp meds this am.    Review of Systems:  Review of Systems  Constitutional: Negative for fever and chills.  HENT: Positive for congestion and ear pain. Negative for ear discharge and sore throat.   Eyes: Negative for  blurred vision.  Respiratory: Negative for cough and shortness of breath.   Cardiovascular: Negative for chest pain.  Gastrointestinal: Negative for heartburn.  Neurological: Negative for speech change, focal weakness and headaches.  Psychiatric/Behavioral: Positive for depression.       In remission    Past Medical History  Diagnosis Date  . Wears glasses   . Cancer ([place])   . Breast CA ([place]) 2006    right [facility])  . Parotid mass 2014    right benign cyst  . History of shingles   . Major depressive disorder, recurrent, severe without psychotic features ([place]) 06/24/2015    Past Surgical History  Procedure Laterality Date  . Breast surgery  2004    rt br mass  . Eye surgery      both cataracts  . Breast lumpectomy with sentinel lymph node biopsy  2005    right  . Re-excision of breast lumpectomy  2005    right  . Abdominal hysterectomy  1982    BSO  . Cesarean section    . Colonoscopy    . Parotidectomy Right 09/05/2013    Procedure: RIGHT SUPERFICIAL PAROTIDECTOMY WITH FACIAL NERVE DISECTION;  Surgeon: [name], MD;  Location: [place];  Service: ENT;  Laterality: Right;  All documentation  done by R.Ward after [phone] --done under G. Garzon's password    No Known Allergies    Medication List       This list is accurate as of: 01/09/16 10:45 AM.  Always use your most recent med list.               hydrochlorothiazide 25 MG tablet  Commonly known as:  HYDRODIURIL  Take 1 tablet (25 mg total) by mouth daily.     lisinopril 20 MG tablet  Commonly known as:  PRINIVIL,ZESTRIL  Take 1 tablet (20 mg total) by mouth daily.     multivitamin with minerals Tabs tablet  Take 1 tablet by mouth daily.     sertraline 50 MG tablet  Commonly known as:  ZOLOFT  Take 1 tablet (50 mg total) by mouth daily.        Physical Exam: Filed Vitals:   01/09/16 0940  Pulse: 60  Temp: 98.1 F (36.7 C)  TempSrc: Oral  Resp: 18  Height: 5\' 3"  (1.6 m)    Weight: 161 lb (73.029 kg)  SpO2: 97%   Body mass index is 28.53 kg/(m^2). Physical Exam  Constitutional: She is oriented to person, place, and time. She appears well-developed and well-nourished. No distress.  HENT:  Mouth/Throat: Oropharynx is clear and moist. No oropharyngeal exudate.  Nasal congestion; TM dull left; no visible oral lesions  Eyes: EOM are normal. Pupils are equal, round, and reactive to light.  Neck:  Bilaterally, but tender only on left   Cardiovascular: Normal rate, regular rhythm and normal heart sounds.   Pulmonary/Chest: Effort normal and breath sounds normal. No respiratory distress.  No rhonchi  Musculoskeletal: She exhibits no tenderness.  No tenderness over TMJ; grimaces as opens her mouth, but points to left neck beneath ear  Lymphadenopathy:    She has cervical adenopathy.  Neurological: She is alert and oriented to person, place, and time. No cranial nerve deficit.    Labs reviewed: Basic Metabolic Panel:  Recent Labs  02/12/15 0905 06/24/15 0728  NA 142 139  K 4.7 4.2  CL 104 107  CO2 22 28  GLUCOSE 110* 107*  BUN 13 15  CREATININE 0.91 0.88  CALCIUM 10.3 10.2  TSH 1.760  --    Liver Function Tests:  Recent Labs  02/12/15 0905 06/24/15 0728  AST 15 20  ALT 10 14  ALKPHOS 100 89  BILITOT 0.4 0.8  PROT 6.6 7.3  ALBUMIN 3.9 4.1   No results for input(s): LIPASE, AMYLASE in the last 8760 hours. No results for input(s): AMMONIA in the last 8760 hours. CBC:  Recent Labs  02/12/15 0905 06/24/15 0728  WBC 5.2 5.7  NEUTROABS 2.2 2.6  HGB 13.9 13.9  HCT 41.9 43.2  MCV 87 88.5  PLT 330 315   Lipid Panel:  Recent Labs  02/12/15 0905  CHOL 204*  HDL 51  LDLCALC 133*  TRIG 100  CHOLHDL 4.0   Assessment/Plan 1. Jaw pain, non-TMJ -unclear if this is related to #2 or a separate problem -seems to be more related to her lymphadenopathy -will treat sinus infection and if that does not solve the problem and the  lymphadenopathy, she may require an ENT referral for further workup or some imaging of her neck given her tobacco chewing habit  2. Acute non-recurrent frontal sinusitis - amoxicillin-clavulanate (AUGMENTIN) 875-125 MG tablet; Take 1 tablet by mouth 2 (two) times daily.  Dispense: 20 tablet; Refill: 0 -eat yogurt daily, push fluids,  warm humidity, plenty of rest and let us know if she does not improve  3. Left cervical lymphadenopathy -painful on left, also on right -monitor with abx and if not improving, f/u with Dr. Eulas Post for possible ENT or CT neck referrals  Next appt:  Keep regular appt with Dr. Eulas Post, but come sooner if not better  Terry Bolotin L. Dyron Kawano, D.O. [facility] [address], [place] [address] Cell Phone (Mon-Fri 8am-5pm):  [phone] On Call:  [phone] & follow prompts after 5pm & weekends Office Phone:  [phone] Office Fax:  [phone]

## 2016-01-10 ENCOUNTER — Telehealth: Payer: Self-pay | Admitting: *Deleted

## 2016-01-10 ENCOUNTER — Encounter: Payer: Self-pay | Admitting: *Deleted

## 2016-01-10 NOTE — Telephone Encounter (Signed)
Patient called and requested a work excuse to be faxed to her work due to appointment yesterday. Faxed work note to [employer]: [phone]

## 2016-01-12 ENCOUNTER — Encounter: Payer: Self-pay | Admitting: Internal Medicine

## 2016-02-07 ENCOUNTER — Ambulatory Visit: Payer: Medicare Other | Admitting: Internal Medicine

## 2016-02-17 ENCOUNTER — Other Ambulatory Visit: Payer: Self-pay

## 2016-02-17 DIAGNOSIS — Z1231 Encounter for screening mammogram for malignant neoplasm of breast: Secondary | ICD-10-CM

## 2016-02-19 ENCOUNTER — Ambulatory Visit
Admission: RE | Admit: 2016-02-19 | Discharge: 2016-02-19 | Disposition: A | Discharge status: Home / Self Care | Payer: Medicare Other | Source: Ambulatory Visit

## 2016-02-19 DIAGNOSIS — Z1231 Encounter for screening mammogram for malignant neoplasm of breast: Secondary | ICD-10-CM | POA: Diagnosis not present

## 2016-03-11 ENCOUNTER — Ambulatory Visit (INDEPENDENT_AMBULATORY_CARE_PROVIDER_SITE_OTHER): Payer: Medicare Other | Admitting: Internal Medicine

## 2016-03-11 ENCOUNTER — Encounter: Payer: Self-pay | Admitting: Internal Medicine

## 2016-03-11 VITALS — BP 160/90 | HR 69 | Temp 98.1°F | Resp 20 | Ht 63.0 in | Wt 163.0 lb

## 2016-03-11 DIAGNOSIS — M26629 Arthralgia of temporomandibular joint, unspecified side: Secondary | ICD-10-CM | POA: Diagnosis not present

## 2016-03-11 DIAGNOSIS — R05 Cough: Secondary | ICD-10-CM

## 2016-03-11 DIAGNOSIS — R221 Localized swelling, mass and lump, neck: Secondary | ICD-10-CM | POA: Diagnosis not present

## 2016-03-11 DIAGNOSIS — R059 Cough, unspecified: Secondary | ICD-10-CM

## 2016-03-11 DIAGNOSIS — E785 Hyperlipidemia, unspecified: Secondary | ICD-10-CM

## 2016-03-11 DIAGNOSIS — J301 Allergic rhinitis due to pollen: Secondary | ICD-10-CM | POA: Diagnosis not present

## 2016-03-11 DIAGNOSIS — I10 Essential (primary) hypertension: Secondary | ICD-10-CM

## 2016-03-11 DIAGNOSIS — F332 Major depressive disorder, recurrent severe without psychotic features: Secondary | ICD-10-CM

## 2016-03-11 DIAGNOSIS — F17229 Nicotine dependence, chewing tobacco, with unspecified nicotine-induced disorders: Secondary | ICD-10-CM | POA: Diagnosis not present

## 2016-03-11 NOTE — Progress Notes (Signed)
Patient ID: Erin Cunningham, female   DOB: [birth year], 75 y.o.   MRN: [id number]    Location:    [facility]   Place of Service:  OFFICE   Chief Complaint  Patient presents with  . Medical Management of Chronic Issues    3 month follow-up for routine visit  . Immunizations    Due peumonia shot (23)    HPI:  75 yo female seen today for f/u. She c/o persistent cough that is worse at night x 1 month. Cough interrupts sleep and radiates into ear. She was seen for an acute visit and rx Augmentin which helped a little. She does notice that when she chews tobacco at night the cough begans and does not stop even after spitting out tobacco. She does not use tobacco during the day time. She gets light headed at times. Occasional dysphagia with solids. She feels a lump in anterior neck. She is c/a cancer. She has been chewing tobacco since age 52  BP controlled on lisinopril and HCTZ. No HA or dizziness. No CP or SOB  Depression - stable on zoloft. She is followed by psych   Past Medical History  Diagnosis Date  . Wears glasses   . Cancer ([place])   . Breast CA ([place]) 2006    right [facility])  . Parotid mass 2014    right benign cyst  . History of shingles   . Major depressive disorder, recurrent, severe without psychotic features ([place]) 06/24/2015    Past Surgical History  Procedure Laterality Date  . Breast surgery  2004    rt br mass  . Eye surgery      both cataracts  . Breast lumpectomy with sentinel lymph node biopsy  2005    right  . Re-excision of breast lumpectomy  2005    right  . Abdominal hysterectomy  1982    BSO  . Cesarean section    . Colonoscopy    . Parotidectomy Right 09/05/2013    Procedure: RIGHT SUPERFICIAL PAROTIDECTOMY WITH FACIAL NERVE DISECTION;  Surgeon: [name], MD;  Location: [place];  Service: ENT;  Laterality: Right;  All documentation done by R.Ward after [phone] --done under [employer] password    Patient Care Team: Gildardo Cranker,  DO as PCP - General (Internal Medicine)  Social History   Social History  . Marital Status: Married    Spouse Name: N/A  . Number of Children: N/A  . Years of Education: N/A   Occupational History  . Not on file.   Social History Main Topics  . Smoking status: Never Smoker   . Smokeless tobacco: Current User    Types: Chew  . Alcohol Use: 0.0 oz/week    0 Standard drinks or equivalent per week     Comment: occ  . Drug Use: No  . Sexual Activity: Not Currently   Other Topics Concern  . Not on file   Social History Narrative     reports that she has never smoked. Her smokeless tobacco use includes Chew. She reports that she drinks alcohol. She reports that she does not use illicit drugs.  No Known Allergies  Medications: Patient's Medications  New Prescriptions   No medications on file  Previous Medications   AMOXICILLIN-CLAVULANATE (AUGMENTIN) 875-125 MG TABLET    Take 1 tablet by mouth 2 (two) times daily.   HYDROCHLOROTHIAZIDE (HYDRODIURIL) 25 MG TABLET    Take 1 tablet (25 mg total) by mouth daily.  LISINOPRIL (PRINIVIL,ZESTRIL) 20 MG TABLET    Take 1 tablet (20 mg total) by mouth daily.   MULTIPLE VITAMIN (MULTIVITAMIN WITH MINERALS) TABS TABLET    Take 1 tablet by mouth daily.   SERTRALINE (ZOLOFT) 50 MG TABLET    Take 1 tablet (50 mg total) by mouth daily.  Modified Medications   No medications on file  Discontinued Medications   No medications on file    Review of Systems  HENT: Positive for ear pain, postnasal drip, sinus pressure and trouble swallowing. Negative for sore throat.   Eyes: Negative for visual disturbance.  Respiratory: Positive for cough. Negative for choking.   Gastrointestinal: Positive for abdominal pain.  Neurological: Positive for light-headedness.  Psychiatric/Behavioral: Positive for sleep disturbance and dysphoric mood.  All other systems reviewed and are negative.   Filed Vitals:   03/11/16 1603  BP: 160/90  Pulse: 69    Temp: 98.1 F (36.7 C)  TempSrc: Oral  Resp: 20  Height: 5\' 3"  (1.6 m)  Weight: 163 lb (73.936 kg)  SpO2: 97%   Body mass index is 28.88 kg/(m^2).  Physical Exam  Constitutional: She is oriented to person, place, and time. She appears well-developed and well-nourished.  HENT:  Mouth/Throat: Oropharynx is clear and moist. No oropharyngeal exudate.  TMs appear nml. No tongue lesions. Left TMJ TTP with swelling and pain upon opening mouth. Reduced translation of jaw to right with pterygoid and masseter muscle hypertrophy  Eyes: Pupils are equal, round, and reactive to light. No scleral icterus.  Neck: Neck supple. Muscular tenderness present. No spinous process tenderness present. Carotid bruit is not present. No tracheal deviation and normal range of motion present. No thyromegaly present.  Cardiovascular: Normal rate, regular rhythm, normal heart sounds and intact distal pulses.  Exam reveals no gallop and no friction rub.   No murmur heard. No LE edema b/l. no calf TTP.   Pulmonary/Chest: Effort normal and breath sounds normal. No stridor. No respiratory distress. She has no wheezes. She has no rales.    Abdominal: Soft. Bowel sounds are normal. She exhibits no distension and no mass. There is no hepatomegaly. There is tenderness (epigastric). There is no rebound and no guarding.  Musculoskeletal: She exhibits edema and tenderness.  Lymphadenopathy:       Head (right side): Submandibular adenopathy present. No preauricular, no posterior auricular and no occipital adenopathy present.       Head (left side): Submandibular adenopathy present. No preauricular, no posterior auricular and no occipital adenopathy present.    She has cervical adenopathy (TTP).       Right cervical: Superficial cervical adenopathy present.       Left cervical: Superficial cervical and deep cervical adenopathy present.       Right: No supraclavicular adenopathy present.       Left: Supraclavicular adenopathy  present.  Neurological: She is alert and oriented to person, place, and time.  Skin: Skin is warm and dry. No rash noted.  Psychiatric: She has a normal mood and affect. Her behavior is normal. Judgment and thought content normal.     Labs reviewed: No visits with results within 3 Month(s) from this visit. Latest known visit with results is:  Clinical Support on 12/10/2015  Component Date Value Ref Range Status  . TB Skin Test 12/12/2015 Negative   Final   Verified by Dr.Reed   . Induration 12/12/2015 4   Final    Mm Screening Breast Tomo Bilateral  02/19/2016  CLINICAL DATA:  Screening. EXAM: DIGITAL SCREENING BILATERAL MAMMOGRAM WITH 3D TOMO WITH CAD COMPARISON:  Previous exam(s). ACR Breast Density Category b: There are scattered areas of fibroglandular density. FINDINGS: There are no findings suspicious for malignancy. Images were processed with CAD. IMPRESSION: No mammographic evidence of malignancy. A result letter of this screening mammogram will be mailed directly to the patient. RECOMMENDATION: Screening mammogram in one year. (Code:SM-B-01Y) BI-RADS CATEGORY  1: Negative. Electronically Signed   By: Ammie Ferrier M.D.   On: 02/19/2016 14:51     Assessment/Plan   ICD-9-CM ICD-10-CM   1. Lump in neck r/o mass 784.2 R22.1 US Soft Tissue Head/Neck     CBC with Differential  2. TMJ pain dysfunction syndrome 524.69 M26.629 US Soft Tissue Head/Neck  3. Essential hypertension, benign 401.1 I10 CBC with Differential     Basic Metabolic Panel  4. Major depressive disorder, recurrent, severe without psychotic features ([place]) 296.33 F33.2 CBC with Differential     Basic Metabolic Panel  5. Hyperlipidemia LDL goal <130 272.4 E78.5   6. Chewing tobacco nicotine dependence with nicotine-induced disorder 292.9 F17.229   7. Cough 786.2 R05   8.      Allergic rhinitis  Recommend claritin redi-tabs daily for seasonal allergy  May need CT neck +/- ENT eval  STOP USING TOBACCO  PRODUCTS  Recommend Tylenol ES for jaw pain  Continue other medications as ordered  Follow up in 3 mos for routine visit  Ekta Dancer S. Perlie Gold  [facility] [address] [place], [place] [address] [phone] Cell (Monday-Friday 8 AM - 5 PM) [phone] After 5 PM and follow prompts

## 2016-03-11 NOTE — Patient Instructions (Addendum)
Recommend claritin redi-tabs daily for seasonal allergy  STOP USING TOBACCO PRODUCTS  Recommend Tylenol ES for jaw pain  May need ENT eval for throat issues  Continue other medications as ordered  Follow up in 3 mos for routine visit

## 2016-03-12 ENCOUNTER — Ambulatory Visit
Admission: RE | Admit: 2016-03-12 | Discharge: 2016-03-12 | Disposition: A | Discharge status: Home / Self Care | Payer: Medicare Other | Source: Ambulatory Visit | Attending: Internal Medicine | Admitting: Internal Medicine

## 2016-03-12 DIAGNOSIS — R221 Localized swelling, mass and lump, neck: Secondary | ICD-10-CM

## 2016-03-12 DIAGNOSIS — M26629 Arthralgia of temporomandibular joint, unspecified side: Secondary | ICD-10-CM

## 2016-03-12 DIAGNOSIS — E041 Nontoxic single thyroid nodule: Secondary | ICD-10-CM | POA: Diagnosis not present

## 2016-03-12 LAB — CBC WITH DIFFERENTIAL/PLATELET
Basophils Absolute: 0.1 10*3/uL (ref 0.0–0.2)
Basos: 2 %
EOS (ABSOLUTE): 0.2 10*3/uL (ref 0.0–0.4)
EOS: 3 %
Hematocrit: 39.6 % (ref 34.0–46.6)
Hemoglobin: 12.8 g/dL (ref 11.1–15.9)
Immature Grans (Abs): 0 10*3/uL (ref 0.0–0.1)
Immature Granulocytes: 0 %
LYMPHS ABS: 2.1 10*3/uL (ref 0.7–3.1)
Lymphs: 34 %
MCH: 28.1 pg (ref 26.6–33.0)
MCHC: 32.3 g/dL (ref 31.5–35.7)
MCV: 87 fL (ref 79–97)
MONOS ABS: 0.6 10*3/uL (ref 0.1–0.9)
Monocytes: 10 %
NEUTROS PCT: 51 %
Neutrophils Absolute: 3.3 10*3/uL (ref 1.4–7.0)
PLATELETS: 334 10*3/uL (ref 150–379)
RBC: 4.55 x10E6/uL (ref 3.77–5.28)
RDW: 13.9 % (ref 12.3–15.4)
WBC: 6.4 10*3/uL (ref 3.4–10.8)

## 2016-03-12 LAB — BASIC METABOLIC PANEL
BUN/Creatinine Ratio: 14 (ref 12–28)
BUN: 12 mg/dL (ref 8–27)
CHLORIDE: 98 mmol/L (ref 96–106)
CO2: 23 mmol/L (ref 18–29)
Calcium: 10.5 mg/dL — ABNORMAL HIGH (ref 8.7–10.3)
Creatinine, Ser: 0.87 mg/dL (ref 0.57–1.00)
GFR calc Af Amer: 76 mL/min/{1.73_m2} (ref >59)
GFR, EST NON AFRICAN AMERICAN: 66 mL/min/{1.73_m2} (ref >59)
Glucose: 72 mg/dL (ref 65–99)
POTASSIUM: 4 mmol/L (ref 3.5–5.2)
Sodium: 138 mmol/L (ref 134–144)

## 2016-03-13 ENCOUNTER — Other Ambulatory Visit: Payer: Self-pay

## 2016-03-13 DIAGNOSIS — R9389 Abnormal findings on diagnostic imaging of other specified body structures: Secondary | ICD-10-CM

## 2016-03-13 DIAGNOSIS — R591 Generalized enlarged lymph nodes: Secondary | ICD-10-CM

## 2016-03-17 ENCOUNTER — Ambulatory Visit
Admission: RE | Admit: 2016-03-17 | Discharge: 2016-03-17 | Disposition: A | Discharge status: Home / Self Care | Payer: Medicare Other | Source: Ambulatory Visit | Attending: Internal Medicine | Admitting: Internal Medicine

## 2016-03-17 DIAGNOSIS — R9389 Abnormal findings on diagnostic imaging of other specified body structures: Secondary | ICD-10-CM

## 2016-03-17 DIAGNOSIS — E041 Nontoxic single thyroid nodule: Secondary | ICD-10-CM | POA: Diagnosis not present

## 2016-03-17 DIAGNOSIS — R591 Generalized enlarged lymph nodes: Secondary | ICD-10-CM

## 2016-03-17 MED ORDER — IOPAMIDOL (ISOVUE-300) INJECTION 61%: 75.0000 mL | Freq: Once | INTRAVENOUS | Status: DC | PRN

## 2016-03-17 MED ORDER — IOPAMIDOL (ISOVUE-300) INJECTION 61%
75.0000 mL | Freq: Once | INTRAVENOUS | Status: AC | PRN
  Administered 2016-03-17: 75 mL via INTRAVENOUS

## 2016-03-18 ENCOUNTER — Inpatient Hospital Stay: Admission: RE | Admit: 2016-03-18 | Payer: Self-pay | Source: Ambulatory Visit

## 2016-03-23 ENCOUNTER — Other Ambulatory Visit: Payer: Self-pay | Admitting: Internal Medicine

## 2016-03-23 DIAGNOSIS — E041 Nontoxic single thyroid nodule: Secondary | ICD-10-CM

## 2016-03-24 ENCOUNTER — Telehealth: Payer: Self-pay | Admitting: *Deleted

## 2016-03-24 DIAGNOSIS — E041 Nontoxic single thyroid nodule: Secondary | ICD-10-CM

## 2016-03-24 NOTE — Telephone Encounter (Signed)
Patient called and stated that she has a bad cough that keeps her up at night. Nothing OTC helps with the cough. Patient wants to know if there is something she can take for this and also wants to know if this could be coming from her thyroid nodule? Please Advise.

## 2016-03-24 NOTE — Telephone Encounter (Signed)
Cough probably related to thyroid nodule. No medication is going to help that cough. Does she have appt with Endocrinology yet?

## 2016-03-24 NOTE — Telephone Encounter (Signed)
ENT referral was placed but order was canceled and Endocrinology referral placed for the thyroid nodule.

## 2016-03-24 NOTE — Telephone Encounter (Signed)
Noted. Rx tessalon perles 200mg  po TID prn cough #30 with 1 RF. If no relief may need to take cough med with codeine

## 2016-03-24 NOTE — Telephone Encounter (Signed)
LMOM to return call.

## 2016-03-25 NOTE — Telephone Encounter (Signed)
LMOM for patient to call office. 

## 2016-03-26 MED ORDER — BENZONATATE 100 MG PO CAPS: 200.0000 mg | ORAL_CAPSULE | Freq: Three times a day (TID) | ORAL | Status: DC | PRN

## 2016-03-26 NOTE — Addendum Note (Signed)
Addended by: Rafael Bihari A on: 03/26/2016 03:41 PM   Modules accepted: Orders

## 2016-03-26 NOTE — Telephone Encounter (Signed)
Patient notified and faxed to pharmacy. Only comes in 100mg , so prescribed to take 2 three times daily as needed #60 for 10 day supply

## 2016-04-01 ENCOUNTER — Ambulatory Visit (INDEPENDENT_AMBULATORY_CARE_PROVIDER_SITE_OTHER): Payer: Medicare Other | Admitting: Psychiatry

## 2016-04-01 ENCOUNTER — Encounter (HOSPITAL_COMMUNITY): Payer: Self-pay | Admitting: Psychiatry

## 2016-04-01 VITALS — BP 140/78 | HR 86 | Ht 63.0 in | Wt 162.0 lb

## 2016-04-01 DIAGNOSIS — F332 Major depressive disorder, recurrent severe without psychotic features: Secondary | ICD-10-CM | POA: Diagnosis not present

## 2016-04-01 MED ORDER — SERTRALINE HCL 50 MG PO TABS: 50.0000 mg | ORAL_TABLET | Freq: Every day | ORAL | Status: DC
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
## 2016-04-20 ENCOUNTER — Ambulatory Visit (INDEPENDENT_AMBULATORY_CARE_PROVIDER_SITE_OTHER): Payer: Medicare Other | Admitting: Endocrinology

## 2016-04-20 ENCOUNTER — Encounter: Payer: Self-pay | Admitting: Endocrinology

## 2016-04-20 DIAGNOSIS — R221 Localized swelling, mass and lump, neck: Secondary | ICD-10-CM | POA: Diagnosis not present

## 2016-04-20 LAB — BASIC METABOLIC PANEL
BUN: 16 mg/dL (ref 6–23)
CO2: 27 mEq/L (ref 19–32)
Calcium: 10.5 mg/dL (ref 8.4–10.5)
Chloride: 107 mEq/L (ref 96–112)
Creatinine, Ser: 1.01 mg/dL (ref 0.40–1.20)
GFR: 68.84 mL/min (ref >60.00)
GLUCOSE: 101 mg/dL — AB (ref 70–99)
Potassium: 3.9 mEq/L (ref 3.5–5.1)
Sodium: 140 mEq/L (ref 135–145)

## 2016-04-20 LAB — VITAMIN D 25 HYDROXY (VIT D DEFICIENCY, FRACTURES): VITD: 18.46 ng/mL — AB (ref 30.00–100.00)

## 2016-04-20 LAB — TSH: TSH: 0.72 u[IU]/mL (ref 0.35–4.50)

## 2016-04-20 NOTE — Patient Instructions (Signed)
blood tests are requested for you today.  We'll let you know about the results.  This will tell us why you are retaining calcium Let's check a CT scan.  you will receive a phone call, about a day and time for an appointment. If the nodule is part of the thyroid, I'll request for you a biopsy If not, I'll request for you to see an ENT specialist.

## 2016-04-20 NOTE — Progress Notes (Signed)
Subjective:    Patient ID: Erin Cunningham, female    DOB: July 03, 1941, 75 y.o.   MRN: [id number]  HPI Pt states few months of slight swelling at the left anterior neck, and assoc pain..  she is unaware of ever having had thyroid problems in the past.  she has no h/o surgery to the neck.  She had XRT to the right chest in 2006, for breast cancer, but says the neck was not included in the port.   She is considered cured, so the only f/u she needs is annual left mammogram.   She chews tobacco. She has never had bony fx or urolithiasis.  She does not take vit-A or D supplements.   Past Medical History  Diagnosis Date  . Wears glasses   . Cancer ([place])   . Breast CA ([place]) 2006    right [facility])  . Parotid mass 2014    right benign cyst  . History of shingles   . Major depressive disorder, recurrent, severe without psychotic features ([place]) 06/24/2015    Past Surgical History  Procedure Laterality Date  . Breast surgery  2004    rt br mass  . Eye surgery      both cataracts  . Breast lumpectomy with sentinel lymph node biopsy  2005    right  . Re-excision of breast lumpectomy  2005    right  . Abdominal hysterectomy  1982    BSO  . Cesarean section    . Colonoscopy    . Parotidectomy Right 09/05/2013    Procedure: RIGHT SUPERFICIAL PAROTIDECTOMY WITH FACIAL NERVE DISECTION;  Surgeon: [name], MD;  Location: [facility];  Service: ENT;  Laterality: Right;  All documentation done by R.Ward after [phone] --done under G. Garzon's password    Social History   Social History  . Marital Status: Married    Spouse Name: N/A  . Number of Children: N/A  . Years of Education: N/A   Occupational History  . Not on file.   Social History Main Topics  . Smoking status: Never Smoker   . Smokeless tobacco: Current User    Types: Chew  . Alcohol Use: 0.0 oz/week    0 Standard drinks or equivalent per week     Comment: occ  . Drug Use: No  . Sexual Activity: Not  Currently   Other Topics Concern  . Not on file   Social History Narrative    Current Outpatient Prescriptions on File Prior to Visit  Medication Sig Dispense Refill  . hydrochlorothiazide (HYDRODIURIL) 25 MG tablet Take 1 tablet (25 mg total) by mouth daily. (Patient taking differently: Take 12.5 mg by mouth daily. ) 30 tablet 0  . lisinopril (PRINIVIL,ZESTRIL) 20 MG tablet Take 1 tablet (20 mg total) by mouth daily. 30 tablet 0  . Multiple Vitamin (MULTIVITAMIN WITH MINERALS) TABS tablet Take 1 tablet by mouth daily.    . sertraline (ZOLOFT) 50 MG tablet Take 1 tablet (50 mg total) by mouth daily. 30 tablet 1   No current facility-administered medications on file prior to visit.    No Known Allergies  Family History  Problem Relation Age of Onset  . Heart disease Mother     MI  . Cancer Father     bone  . Thyroid disease Neg Hx   . Hypercalcemia Neg Hx     BP 136/84 mmHg  Pulse 88  Temp(Src) 98.3 F (36.8 C) (Oral)  Ht 5\' 3"  (  1.6 m)  Wt 163 lb (73.936 kg)  BMI 28.88 kg/m2  SpO2 96%  Review of Systems Denies weight change, hoarseness, visual loss, chest pain, dysphagia, skin rash, easy bruising, cold intolerance, headache, numbness, and rhinorrhea.  She has left ear pain, rhinorrhea, and dry cough.  Depression is well-controlled.      Objective:   Physical Exam VS: see vs page GEN: no distress HEAD: head: no deformity eyes: no periorbital swelling, no proptosis external nose and ears are normal mouth: no lesion seen NECK: supple, thyroid is not enlarged.  i cannot feel the nodule CHEST WALL: no deformity LUNGS: clear to auscultation CV: reg rate and rhythm, no murmur ABD: abdomen is soft, nontender.  no hepatosplenomegaly.  not distended.  no hernia MUSCULOSKELETAL: muscle bulk and strength are grossly normal.  no obvious joint swelling.  gait is normal and steady EXTEMITIES: no deformity.  no ulcer on the feet.  feet are of normal color and temp.  no  edema PULSES: dorsalis pedis intact bilat.  no carotid bruit NEURO:  cn 2-12 grossly intact.   readily moves all 4's.  sensation is intact to touch on the feet.  SKIN:  Normal texture and temperature.  No rash or suspicious lesion is visible.   NODES:  None palpable at the neck PSYCH: alert, well-oriented.  Does not appear anxious nor depressed.    Lab Results  Component Value Date   TSH 1.760 02/12/2015   Lab Results  Component Value Date   CALCIUM 10.5* 03/11/2016   [place]: 1.9 cm hypoechoic nodule just inferior to the left thyroid lobe.  This structure appears to be separate from the thyroid tissue.  Lab Results  Component Value Date   PTH 93* 04/20/2016   CALCIUM 10.5 04/20/2016   CALCIUM 10.2 04/20/2016   Vit-D=18    Assessment & Plan:  Vit-d deficiency, new HTN: well-controlled.  She can tolerate reduction of the HCTZ Hypercalcemia, possibly due to HCTZ Hyperparathyroidism: new, possibly due to vit-d deficiency.  Neck nodule: uncertain relationship to the thyroid.   Patient is advised the following: Patient Instructions  blood tests are requested for you today.  We'll let you know about the results.  This will tell us why you are retaining calcium Let's check a CT scan.  you will receive a phone call, about a day and time for an appointment. If the nodule is part of the thyroid, I'll request for you a biopsy If not, I'll request for you to see an ENT specialist.    addendum: Reduce HCTZ to 12.5/d, and Take ergocalciferol Please come back for a follow-up appointment in 3 weeks

## 2016-04-21 LAB — PTH, INTACT AND CALCIUM
CALCIUM: 10.2 mg/dL (ref 8.4–10.5)
PTH: 93 pg/mL — ABNORMAL HIGH (ref 14–64)

## 2016-04-21 MED ORDER — VITAMIN D (ERGOCALCIFEROL) 1.25 MG (50000 UNIT) PO CAPS: 50000.0000 [IU] | ORAL_CAPSULE | ORAL | Status: DC

## 2016-04-27 ENCOUNTER — Inpatient Hospital Stay: Admission: RE | Admit: 2016-04-27 | Payer: Self-pay | Source: Ambulatory Visit

## 2016-04-29 ENCOUNTER — Other Ambulatory Visit: Payer: Self-pay | Admitting: Endocrinology

## 2016-04-29 ENCOUNTER — Ambulatory Visit
Admission: RE | Admit: 2016-04-29 | Discharge: 2016-04-29 | Disposition: A | Discharge status: Home / Self Care | Payer: Medicare Other | Source: Ambulatory Visit | Attending: Endocrinology | Admitting: Endocrinology

## 2016-04-29 DIAGNOSIS — R221 Localized swelling, mass and lump, neck: Secondary | ICD-10-CM

## 2016-04-29 MED ORDER — IOPAMIDOL (ISOVUE-300) INJECTION 61%
75.0000 mL | Freq: Once | INTRAVENOUS | Status: AC | PRN
  Administered 2016-04-29: 75 mL via INTRAVENOUS

## 2016-05-01 ENCOUNTER — Telehealth: Payer: Self-pay | Admitting: Endocrinology

## 2016-05-01 NOTE — Telephone Encounter (Signed)
Pt states the med we gave her helped her cough tremendously can we rx her some more-did not remember the name it looked like black jelly beans  Call to [facility]

## 2016-06-18 ENCOUNTER — Telehealth: Payer: Self-pay

## 2016-06-18 NOTE — Telephone Encounter (Signed)
Patient aware of Dr.Carter's response. Patient states she will give Dr.Newman's office a call and see what she can do about co-pay. Patient will call us back if needed

## 2016-06-18 NOTE — Telephone Encounter (Signed)
Patient walked in the office stating that she needs some medical help. Patient with ongoing cough (productive/clear at time, other times dry) x several months. Patient had 2 rounds of Tessalon Pearls with no relief. Patient also with left ear pain/ache and headache associated with cough. Patient states it feels like something is stuck in her throat. Patient had CT scan. Patient was referred to [facility] (ENT) and was unable to afford co-pay. Patient thinks something is terribly wrong and needs advise from [facility]  Please advise, you have an available appointment tomorrow at 4:00 pm if patient needs to be re-evaluated

## 2016-06-18 NOTE — Telephone Encounter (Signed)
She really needs to see ENT for further eval as this is a specialist issue. She needs someone to look in her throat and that cannot be done in our office

## 2016-06-24 DIAGNOSIS — R1313 Dysphagia, pharyngeal phase: Secondary | ICD-10-CM | POA: Diagnosis not present

## 2016-06-27 ENCOUNTER — Other Ambulatory Visit: Payer: Self-pay | Admitting: Otolaryngology

## 2016-06-27 DIAGNOSIS — E041 Nontoxic single thyroid nodule: Secondary | ICD-10-CM

## 2016-06-29 ENCOUNTER — Other Ambulatory Visit: Payer: Self-pay | Admitting: Otolaryngology

## 2016-06-29 DIAGNOSIS — E041 Nontoxic single thyroid nodule: Secondary | ICD-10-CM

## 2016-06-30 ENCOUNTER — Inpatient Hospital Stay
Admission: RE | Admit: 2016-06-30 | Discharge: 2016-06-30 | Disposition: A | Discharge status: Home / Self Care | Payer: Self-pay | Source: Ambulatory Visit | Attending: Otolaryngology | Admitting: Otolaryngology

## 2016-07-01 ENCOUNTER — Ambulatory Visit (HOSPITAL_COMMUNITY): Payer: Self-pay | Admitting: Psychiatry

## 2016-07-01 ENCOUNTER — Other Ambulatory Visit (HOSPITAL_COMMUNITY): Payer: Self-pay | Admitting: Otolaryngology

## 2016-07-01 DIAGNOSIS — E041 Nontoxic single thyroid nodule: Secondary | ICD-10-CM

## 2016-07-09 ENCOUNTER — Other Ambulatory Visit: Payer: Self-pay | Admitting: Physician Assistant

## 2016-07-09 ENCOUNTER — Ambulatory Visit (HOSPITAL_COMMUNITY): Admission: RE | Admit: 2016-07-09 | Payer: Medicare Other | Source: Ambulatory Visit

## 2016-07-09 ENCOUNTER — Other Ambulatory Visit: Payer: Self-pay | Admitting: Radiology

## 2016-07-10 ENCOUNTER — Ambulatory Visit (HOSPITAL_COMMUNITY)
Admission: RE | Admit: 2016-07-10 | Discharge: 2016-07-10 | Disposition: A | Discharge status: Home / Self Care | Payer: Medicare Other | Source: Ambulatory Visit | Attending: Otolaryngology | Admitting: Otolaryngology

## 2016-07-10 ENCOUNTER — Other Ambulatory Visit: Payer: Self-pay | Admitting: Radiology

## 2016-07-10 DIAGNOSIS — R591 Generalized enlarged lymph nodes: Secondary | ICD-10-CM | POA: Insufficient documentation

## 2016-07-10 DIAGNOSIS — R590 Localized enlarged lymph nodes: Secondary | ICD-10-CM | POA: Diagnosis not present

## 2016-07-10 DIAGNOSIS — E041 Nontoxic single thyroid nodule: Secondary | ICD-10-CM | POA: Diagnosis not present

## 2016-07-10 MED ORDER — LIDOCAINE HCL 1 % IJ SOLN
INTRAMUSCULAR | Status: AC
  Filled 2016-07-10: qty 20

## 2016-07-10 NOTE — Procedures (Signed)
Interventional Radiology Procedure Note  Procedure: US guided lymph node biopsy, left neck   Complications: None Recommendations:  - Ok to shower tomorrow - Do not submerge for 7 days - Routine wound care   Signed,  Dulcy Fanny. Earleen Newport, DO

## 2016-07-20 ENCOUNTER — Telehealth: Payer: Self-pay

## 2016-07-20 NOTE — Telephone Encounter (Signed)
Message left on triage voicemail: Patient would like to know the results of a needle biopsy performed a week ago.   I called patient and informed her to contact Dr.Newman's office for he was the ordering provider. Patient agreed

## 2016-08-13 ENCOUNTER — Ambulatory Visit (HOSPITAL_COMMUNITY): Payer: Self-pay | Admitting: Psychiatry

## 2016-09-21 ENCOUNTER — Other Ambulatory Visit: Payer: Self-pay | Admitting: Internal Medicine

## 2016-10-17 ENCOUNTER — Other Ambulatory Visit: Payer: Self-pay | Admitting: Internal Medicine

## 2016-10-19 ENCOUNTER — Other Ambulatory Visit: Payer: Self-pay | Admitting: *Deleted

## 2016-10-19 MED ORDER — LISINOPRIL 20 MG PO TABS: 20.0000 mg | ORAL_TABLET | Freq: Every day | ORAL | 0 refills | Status: DC

## 2016-10-19 NOTE — Telephone Encounter (Signed)
[facility] 

## 2016-11-09 ENCOUNTER — Telehealth: Payer: Self-pay | Admitting: Internal Medicine

## 2016-11-09 NOTE — Telephone Encounter (Signed)
left msg asking pt to confirm this AWV appt w/ nurse. VDM (DD) °

## 2016-11-20 ENCOUNTER — Ambulatory Visit: Payer: Medicare Other

## 2016-12-02 ENCOUNTER — Encounter: Payer: Self-pay | Admitting: Internal Medicine

## 2016-12-02 ENCOUNTER — Ambulatory Visit (INDEPENDENT_AMBULATORY_CARE_PROVIDER_SITE_OTHER): Payer: Medicare Other | Admitting: Internal Medicine

## 2016-12-02 VITALS — BP 180/98 | HR 70 | Temp 97.7°F | Ht 63.0 in | Wt 164.0 lb

## 2016-12-02 DIAGNOSIS — E785 Hyperlipidemia, unspecified: Secondary | ICD-10-CM

## 2016-12-02 DIAGNOSIS — E559 Vitamin D deficiency, unspecified: Secondary | ICD-10-CM

## 2016-12-02 DIAGNOSIS — N951 Menopausal and female climacteric states: Secondary | ICD-10-CM | POA: Diagnosis not present

## 2016-12-02 DIAGNOSIS — I10 Essential (primary) hypertension: Secondary | ICD-10-CM | POA: Diagnosis not present

## 2016-12-02 DIAGNOSIS — F332 Major depressive disorder, recurrent severe without psychotic features: Secondary | ICD-10-CM

## 2016-12-02 DIAGNOSIS — F17229 Nicotine dependence, chewing tobacco, with unspecified nicotine-induced disorders: Secondary | ICD-10-CM

## 2016-12-02 LAB — COMPLETE METABOLIC PANEL WITH GFR
ALBUMIN: 3.9 g/dL (ref 3.6–5.1)
ALK PHOS: 103 U/L (ref 33–130)
ALT: 7 U/L (ref 6–29)
AST: 13 U/L (ref 10–35)
BUN: 13 mg/dL (ref 7–25)
CALCIUM: 10.2 mg/dL (ref 8.6–10.4)
CHLORIDE: 106 mmol/L (ref 98–110)
CO2: 25 mmol/L (ref 20–31)
Creat: 0.89 mg/dL (ref 0.60–0.93)
GFR, EST NON AFRICAN AMERICAN: 64 mL/min (ref >=60)
GFR, Est African American: 74 mL/min (ref >=60)
GLUCOSE: 65 mg/dL (ref 65–99)
POTASSIUM: 4.4 mmol/L (ref 3.5–5.3)
SODIUM: 141 mmol/L (ref 135–146)
Total Bilirubin: 0.5 mg/dL (ref 0.2–1.2)
Total Protein: 6.6 g/dL (ref 6.1–8.1)

## 2016-12-02 LAB — LIPID PANEL
CHOL/HDL RATIO: 3.7 ratio (ref <5.0)
CHOLESTEROL: 212 mg/dL — AB (ref <200)
HDL: 57 mg/dL (ref >50)
LDL Cholesterol: 135 mg/dL — ABNORMAL HIGH (ref <100)
Triglycerides: 98 mg/dL (ref <150)
VLDL: 20 mg/dL (ref <30)

## 2016-12-02 MED ORDER — ESTROGENS, CONJUGATED 0.625 MG/GM VA CREA: TOPICAL_CREAM | VAGINAL | 6 refills | Status: DC

## 2016-12-02 MED ORDER — LISINOPRIL 40 MG PO TABS: 40.0000 mg | ORAL_TABLET | Freq: Every day | ORAL | 3 refills | Status: DC

## 2016-12-02 NOTE — Patient Instructions (Signed)
Continue current medications as ordered  Follow up with specialists as scheduled  Tobacco use cessation discussed and highly urged  Follow up in 3 mos for routine visit. Will call with lab results

## 2016-12-02 NOTE — Progress Notes (Signed)
Patient ID: Erin Cunningham, female   DOB: Sep 11, 1941, 75 y.o.   MRN: [id number]    Location:  [facility] Place of Service: OFFICE  Chief Complaint  Patient presents with  . Medical Management of Chronic Issues    3 month Follow up  . Flu Vaccine    Requested    HPI:  75 yo female seen today for f/u. She is c/a vaginal dryness with sexual intercourse. No other post menopausal sx's. No vaginal d/c.  She has a persistent cough that is worse at night x 6 months. Cough interrupts sleep and radiates into ear. She was seen for an acute visit and rx Augmentin which helped a little. She does notice that when she chews tobacco at night the cough begans and does not stop even after spitting out tobacco. She does not use tobacco during the day time. She gets light headed at times. Occasional dysphagia with solids. She feels a lump in anterior neck. She is c/a cancer. She has been chewing tobacco since age 44. She underwent neck bx in Aug 2017 by ENT Dr Lucia Gaskins and results revealed no cancer cells  BP controlled on lisinopril and HCTZ. No HA or dizziness. No CP or SOB. She does admit that she did not take her medication yet today  Depression - stable on zoloft. She is followed by psych   Past Medical History:  Diagnosis Date  . Breast CA ([place]) 2006   right [facility])  . Cancer ([place])   . History of shingles   . Major depressive disorder, recurrent, severe without psychotic features ([place]) 06/24/2015  . Parotid mass 2014   right benign cyst  . Wears glasses     Past Surgical History:  Procedure Laterality Date  . ABDOMINAL HYSTERECTOMY  1982   BSO  . BREAST LUMPECTOMY WITH SENTINEL LYMPH NODE BIOPSY  2005   right  . BREAST SURGERY  2004   rt br mass  . CESAREAN SECTION    . COLONOSCOPY    . EYE SURGERY     both cataracts  . PAROTIDECTOMY Right 09/05/2013   Procedure: RIGHT SUPERFICIAL PAROTIDECTOMY WITH FACIAL NERVE DISECTION;  Surgeon: [name], MD;  Location: [facility];  Service: ENT;  Laterality: Right;  All documentation done by R.Ward after [phone] --done under G. Garzon's password  . RE-EXCISION OF BREAST LUMPECTOMY  2005   right    Patient Care Team: Gildardo Cranker, DO as PCP - General (Internal Medicine) [name], MD as Consulting Physician (Otolaryngology)  Social History   Social History  . Marital status: Married    Spouse name: N/A  . Number of children: N/A  . Years of education: N/A   Occupational History  . Not on file.   Social History Main Topics  . Smoking status: Never Smoker  . Smokeless tobacco: Current User    Types: Chew  . Alcohol use 0.0 oz/week     Comment: occ  . Drug use: No  . Sexual activity: Not Currently   Other Topics Concern  . Not on file   Social History Narrative  . No narrative on file     reports that she has never smoked. Her smokeless tobacco use includes Chew. She reports that she drinks alcohol. She reports that she does not use drugs.  Family History  Problem Relation Age of Onset  . Heart disease Mother     MI  . Cancer Father     bone  .  Thyroid disease Neg Hx   . Hypercalcemia Neg Hx    Family Status  Relation Status  . Mother Deceased at age 39  . Father Deceased at age 8  . Neg Hx      No Known Allergies  Medications: Patient's Medications  New Prescriptions   CONJUGATED ESTROGENS (PREMARIN) VAGINAL CREAM    1 applicatorful PV 3 times per week  Previous Medications   HYDROCHLOROTHIAZIDE (HYDRODIURIL) 25 MG TABLET    Take 1 tablet (25 mg total) by mouth daily.   MULTIPLE VITAMIN (MULTIVITAMIN WITH MINERALS) TABS TABLET    Take 1 tablet by mouth daily.   SERTRALINE (ZOLOFT) 50 MG TABLET    Take 1 tablet (50 mg total) by mouth daily.  Modified Medications   Modified Medication Previous Medication   LISINOPRIL (PRINIVIL,ZESTRIL) 40 MG TABLET lisinopril (PRINIVIL,ZESTRIL) 20 MG tablet      Take 1 tablet (40 mg total) by mouth daily. Need to schedule an  appointment.    Take 1 tablet (20 mg total) by mouth daily. Need to schedule an appointment.  Discontinued Medications   VITAMIN D, ERGOCALCIFEROL, (DRISDOL) 50000 UNITS CAPS CAPSULE    Take 1 capsule (50,000 Units total) by mouth 3 (three) times a week.    Review of Systems  Respiratory: Positive for cough.   Genitourinary: Negative for vaginal bleeding.       Vaginal dryness  All other systems reviewed and are negative.   Vitals:   12/02/16 1051  BP: (!) 180/98  Pulse: 70  Temp: 97.7 F (36.5 C)  TempSrc: Oral  SpO2: 98%  Weight: 164 lb (74.4 kg)  Height: _0  (1.6 m)   Body mass index is 29.05 kg/m.  Physical Exam  Constitutional: She is oriented to person, place, and time. She appears well-developed and well-nourished.  HENT:  Mouth/Throat: Oropharynx is clear and moist. No oropharyngeal exudate.  No tongue lesions. No oral thrush  Eyes: Pupils are equal, round, and reactive to light. No scleral icterus.  Neck: Neck supple. Carotid bruit is not present. No tracheal deviation present. No thyromegaly present.  Cardiovascular: Normal rate, regular rhythm, normal heart sounds and intact distal pulses.  Exam reveals no gallop and no friction rub.   No murmur heard. No LE edema b/l. no calf TTP.   Pulmonary/Chest: Effort normal and breath sounds normal. No stridor. No respiratory distress. She has no wheezes. She has no rales.  Abdominal: Soft. Bowel sounds are normal. She exhibits no distension and no mass. There is no hepatomegaly. There is no tenderness. There is no rebound and no guarding.  Lymphadenopathy:    She has no cervical adenopathy.  Neurological: She is alert and oriented to person, place, and time.  Skin: Skin is warm and dry. No rash noted.  Psychiatric: She has a normal mood and affect. Her behavior is normal. Judgment and thought content normal.     Labs reviewed: No visits with results within 3 Month(s) from this visit.  Latest known visit with  results is:  Office Visit on 04/20/2016  Component Date Value Ref Range Status  . Sodium 04/20/2016 140  135 - 145 mEq/L Final  . Potassium 04/20/2016 3.9  3.5 - 5.1 mEq/L Final  . Chloride 04/20/2016 107  96 - 112 mEq/L Final  . CO2 04/20/2016 27  19 - 32 mEq/L Final  . Glucose, Bld 04/20/2016 101* 70 - 99 mg/dL Final  . BUN 04/20/2016 16  6 - 23 mg/dL Final  . Creatinine,  Ser 04/20/2016 1.01  0.40 - 1.20 mg/dL Final  . Calcium 04/20/2016 10.5  8.4 - 10.5 mg/dL Final  . GFR 04/20/2016 68.84  >60.00 mL/min Final  . TSH 04/20/2016 0.72  0.35 - 4.50 uIU/mL Final  . VITD 04/20/2016 18.46* 30.00 - 100.00 ng/mL Final  . PTH 04/21/2016 93* 14 - 64 pg/mL Final  . Calcium 04/21/2016 10.2  8.4 - 10.5 mg/dL Final   Comment:   Interpretive Guide:                              Intact PTH               Calcium                              ----------               ------- Normal Parathyroid           Normal                   Normal Hypoparathyroidism           Low or Low Normal        Low Hyperparathyroidism      Primary                 Normal or High           High      Secondary               High                     Normal or Low      Tertiary                High                     High Non-Parathyroid   Hypercalcemia              Low or Low Normal        High     No results found.   Assessment/Plan   ICD-9-CM ICD-10-CM   1. Vaginal dryness, menopausal 627.2 N95.1 conjugated estrogens (PREMARIN) vaginal cream  2. Essential hypertension, benign 401.1 I10 CMP with eGFR     lisinopril (PRINIVIL,ZESTRIL) 40 MG tablet  3. Vitamin D deficiency 268.9 E55.9 Vitamin D, 25-hydroxy  4. Major depressive disorder, recurrent, severe without psychotic features ([place]) 296.33 F33.2 CMP with eGFR  5. Chewing tobacco nicotine dependence with nicotine-induced disorder 292.9 F17.229   6. Hyperlipidemia LDL goal <130 272.4 E78.5 Lipid Panel   Continue current medications as ordered  Follow up with  specialists as scheduled  Tobacco use cessation discussed and highly urged  Follow up in 3 mos for routine visit. Will call with lab results  [facility] S. Perlie Gold  [facility] [address] [place], [place] [address] [phone] Cell (Monday-Friday 8 AM - 5 PM) [phone] After 5 PM and follow prompts

## 2016-12-03 LAB — VITAMIN D 25 HYDROXY (VIT D DEFICIENCY, FRACTURES): Vit D, 25-Hydroxy: 23 ng/mL — ABNORMAL LOW (ref 30–100)

## 2016-12-16 ENCOUNTER — Telehealth (HOSPITAL_COMMUNITY): Payer: Self-pay

## 2016-12-16 DIAGNOSIS — F332 Major depressive disorder, recurrent severe without psychotic features: Secondary | ICD-10-CM

## 2016-12-16 MED ORDER — SERTRALINE HCL 50 MG PO TABS: 50.0000 mg | ORAL_TABLET | Freq: Every day | ORAL | 0 refills | Status: DC

## 2016-12-16 NOTE — Telephone Encounter (Signed)
Okay to provide 30 day supply.

## 2016-12-16 NOTE — Telephone Encounter (Signed)
Patient is calling, she has an appointment with you on 2/15, however she is out of her Zoloft. Patient was last seen in April of 2017 by Dr. Salem Senate - she has had 2 appointments since, but they were both cancelled due to provider out of office. Is it okay to refill?

## 2016-12-16 NOTE — Telephone Encounter (Signed)
Sent refill for one month, called patient and she is aware

## 2017-01-07 ENCOUNTER — Encounter: Payer: Self-pay | Admitting: Internal Medicine

## 2017-01-08 ENCOUNTER — Ambulatory Visit (HOSPITAL_COMMUNITY): Payer: Self-pay | Admitting: Psychiatry

## 2017-01-21 ENCOUNTER — Encounter (INDEPENDENT_AMBULATORY_CARE_PROVIDER_SITE_OTHER): Payer: Self-pay

## 2017-01-21 ENCOUNTER — Ambulatory Visit (INDEPENDENT_AMBULATORY_CARE_PROVIDER_SITE_OTHER): Payer: Medicare Other | Admitting: Psychiatry

## 2017-01-21 ENCOUNTER — Encounter (HOSPITAL_COMMUNITY): Payer: Self-pay | Admitting: Psychiatry

## 2017-01-21 DIAGNOSIS — F419 Anxiety disorder, unspecified: Secondary | ICD-10-CM

## 2017-01-21 DIAGNOSIS — Z79899 Other long term (current) drug therapy: Secondary | ICD-10-CM

## 2017-01-21 DIAGNOSIS — F332 Major depressive disorder, recurrent severe without psychotic features: Secondary | ICD-10-CM

## 2017-01-21 MED ORDER — SERTRALINE HCL 50 MG PO TABS: 50.0000 mg | ORAL_TABLET | Freq: Every day | ORAL | 2 refills | Status: DC
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
## 2017-02-07 ENCOUNTER — Emergency Department (HOSPITAL_COMMUNITY)
Admission: EM | Admit: 2017-02-07 | Discharge: 2017-02-07 | Disposition: A | Discharge status: Home / Self Care | Payer: BC Managed Care – PPO | Attending: Emergency Medicine | Admitting: Emergency Medicine

## 2017-02-07 ENCOUNTER — Encounter (HOSPITAL_COMMUNITY): Payer: Self-pay

## 2017-02-07 DIAGNOSIS — Z79899 Other long term (current) drug therapy: Secondary | ICD-10-CM | POA: Insufficient documentation

## 2017-02-07 DIAGNOSIS — Z853 Personal history of malignant neoplasm of breast: Secondary | ICD-10-CM | POA: Diagnosis not present

## 2017-02-07 DIAGNOSIS — R51 Headache: Secondary | ICD-10-CM | POA: Insufficient documentation

## 2017-02-07 DIAGNOSIS — R42 Dizziness and giddiness: Secondary | ICD-10-CM | POA: Diagnosis not present

## 2017-02-07 DIAGNOSIS — I10 Essential (primary) hypertension: Secondary | ICD-10-CM

## 2017-02-07 DIAGNOSIS — R519 Headache, unspecified: Secondary | ICD-10-CM

## 2017-02-07 LAB — BASIC METABOLIC PANEL
Anion gap: 6 (ref 5–15)
BUN: 14 mg/dL (ref 6–20)
CHLORIDE: 107 mmol/L (ref 101–111)
CO2: 26 mmol/L (ref 22–32)
CREATININE: 0.76 mg/dL (ref 0.44–1.00)
Calcium: 10.2 mg/dL (ref 8.9–10.3)
GFR calc Af Amer: >60 mL/min (ref >60)
GLUCOSE: 89 mg/dL (ref 65–99)
POTASSIUM: 4.1 mmol/L (ref 3.5–5.1)
SODIUM: 139 mmol/L (ref 135–145)

## 2017-02-07 LAB — CBC WITH DIFFERENTIAL/PLATELET
Basophils Absolute: 0 10*3/uL (ref 0.0–0.1)
Basophils Relative: 1 %
EOS ABS: 0.2 10*3/uL (ref 0.0–0.7)
EOS PCT: 3 %
HCT: 40.2 % (ref 36.0–46.0)
Hemoglobin: 13.4 g/dL (ref 12.0–15.0)
LYMPHS ABS: 2.1 10*3/uL (ref 0.7–4.0)
LYMPHS PCT: 37 %
MCH: 28.9 pg (ref 26.0–34.0)
MCHC: 33.3 g/dL (ref 30.0–36.0)
MCV: 86.8 fL (ref 78.0–100.0)
MONO ABS: 0.4 10*3/uL (ref 0.1–1.0)
MONOS PCT: 8 %
Neutro Abs: 2.9 10*3/uL (ref 1.7–7.7)
Neutrophils Relative %: 51 %
PLATELETS: 276 10*3/uL (ref 150–400)
RBC: 4.63 MIL/uL (ref 3.87–5.11)
RDW: 13.6 % (ref 11.5–15.5)
WBC: 5.6 10*3/uL (ref 4.0–10.5)

## 2017-02-07 MED ORDER — MAGNESIUM SULFATE 2 GM/50ML IV SOLN
2.0000 g | Freq: Once | INTRAVENOUS | Status: AC
  Administered 2017-02-07: 2 g via INTRAVENOUS
  Filled 2017-02-07: qty 50

## 2017-02-07 MED ORDER — PROCHLORPERAZINE EDISYLATE 5 MG/ML IJ SOLN
10.0000 mg | Freq: Once | INTRAMUSCULAR | Status: AC
  Administered 2017-02-07: 10 mg via INTRAVENOUS
  Filled 2017-02-07: qty 2

## 2017-02-07 MED ORDER — KETOROLAC TROMETHAMINE 15 MG/ML IJ SOLN
15.0000 mg | Freq: Once | INTRAMUSCULAR | Status: AC
  Administered 2017-02-07: 15 mg via INTRAVENOUS
  Filled 2017-02-07: qty 1

## 2017-02-07 MED ORDER — HYDRALAZINE HCL 20 MG/ML IJ SOLN
10.0000 mg | Freq: Once | INTRAMUSCULAR | Status: AC
  Administered 2017-02-07: 10 mg via INTRAVENOUS
  Filled 2017-02-07: qty 1

## 2017-02-07 NOTE — Discharge Instructions (Signed)
Please take your blood pressure medication as prescribed.

## 2017-02-07 NOTE — ED Provider Notes (Signed)
[facility] DEPT Provider Note   CSN: [id number] Arrival date & time: 02/07/17  [id number]     History   Chief Complaint Chief Complaint  Patient presents with  . Dizziness  . Headache    HPI Erin Cunningham is a 76 y.o. female.  The history is provided by the patient.  Headache   This is a recurrent problem. The current episode started yesterday. The problem occurs constantly. The problem has been gradually worsening. The headache is associated with loud noise (high BP). Pain location: generalized. The quality of the pain is described as dull and throbbing. The pain is moderate. The pain does not radiate. Pertinent negatives include no fever, no chest pressure, no near-syncope, no orthopnea, no palpitations, no syncope and no vomiting. She has tried nothing for the symptoms.   Pt reports not being compliant with her BP meds; only takes it when she feels her BP is high (headaches). Noted sBP >200 at pharmacy.   Past Medical History:  Diagnosis Date  . Breast CA ([place]) 2006   right [facility])  . Cancer ([place])   . High blood pressure   . History of shingles   . Major depressive disorder, recurrent, severe without psychotic features ([place]) 06/24/2015  . Parotid mass 2014   right benign cyst  . Wears glasses     Patient Active Problem List   Diagnosis Date Noted  . Hypercalcemia 04/20/2016  . Neck nodule 04/20/2016  . Essential hypertension, benign 07/21/2015  . Hyperlipidemia LDL goal <130 07/21/2015  . Abnormal fasting glucose 07/21/2015  . History of breast cancer in female 07/21/2015  . Major depressive disorder, recurrent, severe without psychotic features ([place]) 06/24/2015  . MDD (major depressive disorder), recurrent episode, severe ([place]) 06/24/2015  . Onychomycosis 06/12/2014  . Pain in lower limb 06/12/2014    Past Surgical History:  Procedure Laterality Date  . ABDOMINAL HYSTERECTOMY  1982   BSO  . BREAST LUMPECTOMY WITH SENTINEL LYMPH NODE BIOPSY  2005   right  .  BREAST SURGERY  2004   rt br mass  . CESAREAN SECTION    . COLONOSCOPY    . EYE SURGERY     both cataracts  . PAROTIDECTOMY Right 09/05/2013   Procedure: RIGHT SUPERFICIAL PAROTIDECTOMY WITH FACIAL NERVE DISECTION;  Surgeon: [name], MD;  Location: [facility];  Service: ENT;  Laterality: Right;  All documentation done by R.Ward after [phone] --done under G. Garzon's password  . RE-EXCISION OF BREAST LUMPECTOMY  2005   right    OB History    No data available       Home Medications    Prior to Admission medications   Medication Sig Start Date End Date Taking? Authorizing Provider  lisinopril (PRINIVIL,ZESTRIL) 40 MG tablet Take 1 tablet (40 mg total) by mouth daily. Need to schedule an appointment. 12/02/16  Yes Gildardo Cranker, DO  sertraline (ZOLOFT) 50 MG tablet Take 1 tablet (50 mg total) by mouth daily. 01/21/17  Yes [name], MD  conjugated estrogens (PREMARIN) vaginal cream 1 applicatorful PV 3 times per week Patient not taking: Reported on 02/07/2017 12/02/16   Gildardo Cranker, DO    Family History Family History  Problem Relation Age of Onset  . Heart disease Mother     MI  . Cancer Father     bone  . Thyroid disease Neg Hx   . Hypercalcemia Neg Hx     Social History Social History  Substance Use Topics  .  Smoking status: Never Smoker  . Smokeless tobacco: Current User    Types: Chew  . Alcohol use 0.0 oz/week     Comment: occ     Allergies   Patient has no known allergies.   Review of Systems Review of Systems  Constitutional: Negative for fever.  Cardiovascular: Negative for palpitations, orthopnea, syncope and near-syncope.  Gastrointestinal: Negative for vomiting.  Neurological: Positive for light-headedness and headaches.  Ten systems are reviewed and are negative for acute change except as noted in the HPI    Physical Exam Updated Vital Signs BP (!) 220/96 (BP Location: Left Arm)   Pulse 61   Temp 97.8 F (36.6  C) (Oral)   Resp 16   SpO2 95%   Physical Exam  Constitutional: She is oriented to person, place, and time. She appears well-developed and well-nourished. No distress.  HENT:  Head: Normocephalic and atraumatic.  Nose: Nose normal.  Eyes: Conjunctivae and EOM are normal. Pupils are equal, round, and reactive to light. Right eye exhibits no discharge. Left eye exhibits no discharge. No scleral icterus.  Neck: Normal range of motion. Neck supple.  Cardiovascular: Normal rate and regular rhythm.  Exam reveals no gallop and no friction rub.   No murmur heard. Pulmonary/Chest: Effort normal and breath sounds normal. No stridor. No respiratory distress. She has no rales.  Abdominal: Soft. She exhibits no distension. There is no tenderness.  Musculoskeletal: She exhibits no edema or tenderness.  Neurological: She is alert and oriented to person, place, and time.  Mental Status: Alert and oriented to person, place, and time. Attention and concentration normal. Speech clear. Recent memory is intac  Cranial Nerves  II Visual Fields: Intact to confrontation. Visual fields intact. III, IV, VI: Pupils equal and reactive to light and near. Full eye movement without nystagmus  V Facial Sensation: Normal. No weakness of masticatory muscles  VII: No facial weakness or asymmetry  VIII Auditory Acuity: Grossly normal  IX/X: The uvula is midline; the palate elevates symmetrically  XI: Normal sternocleidomastoid and trapezius strength  XII: The tongue is midline. No atrophy or fasciculations.   Motor System: Muscle Strength: 5/5 and symmetric in the upper and lower extremities. No pronation or drift.  Muscle Tone: Tone and muscle bulk are normal in the upper and lower extremities.   Reflexes: DTRs: 2+ and symmetrical in all four extremities. Plantar responses are flexor bilaterally.  Coordination: Intact finger-to-nose, heel-to-shin. No tremor.  Sensation: Intact to light touch, and pinprick.  Gait:  Routine gait  normal    Skin: Skin is warm and dry. No rash noted. She is not diaphoretic. No erythema.  Psychiatric: She has a normal mood and affect.  Vitals reviewed.    ED Treatments / Results  Labs (all labs ordered are listed, but only abnormal results are displayed) Labs Reviewed  CBC WITH DIFFERENTIAL/PLATELET  BASIC METABOLIC PANEL    EKG  EKG Interpretation None       Radiology No results found.  Procedures Procedures (including critical care time)  Medications Ordered in ED Medications  prochlorperazine (COMPAZINE) injection 10 mg (10 mg Intravenous Given 02/07/17 1238)  magnesium sulfate IVPB 2 g 50 mL (0 g Intravenous Stopped 02/07/17 1328)  ketorolac (TORADOL) 15 MG/ML injection 15 mg (15 mg Intravenous Given 02/07/17 1238)  hydrALAZINE (APRESOLINE) injection 10 mg (10 mg Intravenous Given 02/07/17 1435)     Initial Impression / Assessment and Plan / ED Course  I have reviewed the triage vital signs and the  nursing notes.  Pertinent labs & imaging results that were available during my care of the patient were reviewed by me and considered in my medical decision making (see chart for details).     Typical headache related to elevated BP for the pt. Non focal neuro exam. No recent head trauma. No fever. Doubt meningitis. Doubt intracranial bleed. Doubt IIH. No indication for imaging. Will treat with migraine cocktail and reevaluate.  Labs w/o evidence of end organ damage.  Complete resolution of headache with treatment. BP improvement.  Vitals:   02/07/17 1310 02/07/17 1316 02/07/17 1400 02/07/17 1502  BP: (!) 253/84 189/75 193/87 171/84  Pulse: 61 62 61 86  Resp: 18   16  Temp: 97.8 F (36.6 C)   97.8 F (36.6 C)  TempSrc: Oral   Oral  SpO2: 95% 96% 95% 96%  Weight:      Height:         Final Clinical Impressions(s) / ED Diagnoses   Final diagnoses:  Hypertension, unspecified type  Bad headache  Lightheadedness   Disposition:  Discharge  Condition: Good  I have discussed the results, Dx and Tx plan with the patient who expressed understanding and agree(s) with the plan. Discharge instructions discussed at great length. The patient was given strict return precautions who verbalized understanding of the instructions. No further questions at time of discharge.    New Prescriptions   No medications on file    Follow Up: Gildardo Cranker, [place] [id number] [phone]  Schedule an appointment as soon as possible for a visit  in 3-5 days      Fatima Blank, MD 02/07/17 1556

## 2017-02-07 NOTE — ED Triage Notes (Signed)
Pt with headache and dizziness since yesterday.  States bp running high

## 2017-02-07 NOTE — ED Notes (Signed)
PT DISCHARGED. INSTRUCTIONS GIVEN. AAOX4. PT IN NO APPARENT DISTRESS. THE OPPORTUNITY TO ASK QUESTIONS WAS PROVIDED. 

## 2017-03-03 ENCOUNTER — Ambulatory Visit: Payer: Medicare Other | Admitting: Internal Medicine

## 2017-04-05 ENCOUNTER — Other Ambulatory Visit: Payer: Self-pay | Admitting: Internal Medicine

## 2017-04-05 DIAGNOSIS — Z1231 Encounter for screening mammogram for malignant neoplasm of breast: Secondary | ICD-10-CM

## 2017-04-20 ENCOUNTER — Ambulatory Visit (HOSPITAL_COMMUNITY): Payer: Self-pay | Admitting: Psychiatry

## 2017-05-12 ENCOUNTER — Ambulatory Visit (INDEPENDENT_AMBULATORY_CARE_PROVIDER_SITE_OTHER): Payer: BC Managed Care – PPO | Admitting: Internal Medicine

## 2017-05-12 ENCOUNTER — Encounter: Payer: Self-pay | Admitting: Internal Medicine

## 2017-05-12 VITALS — BP 162/88 | HR 63 | Temp 97.6°F | Ht 63.0 in | Wt 164.0 lb

## 2017-05-12 DIAGNOSIS — F332 Major depressive disorder, recurrent severe without psychotic features: Secondary | ICD-10-CM | POA: Diagnosis not present

## 2017-05-12 DIAGNOSIS — Z23 Encounter for immunization: Secondary | ICD-10-CM | POA: Diagnosis not present

## 2017-05-12 DIAGNOSIS — E785 Hyperlipidemia, unspecified: Secondary | ICD-10-CM | POA: Diagnosis not present

## 2017-05-12 DIAGNOSIS — E2839 Other primary ovarian failure: Secondary | ICD-10-CM

## 2017-05-12 DIAGNOSIS — I10 Essential (primary) hypertension: Secondary | ICD-10-CM

## 2017-05-12 DIAGNOSIS — F17229 Nicotine dependence, chewing tobacco, with unspecified nicotine-induced disorders: Secondary | ICD-10-CM

## 2017-05-12 DIAGNOSIS — R1011 Right upper quadrant pain: Secondary | ICD-10-CM

## 2017-05-12 LAB — COMPLETE METABOLIC PANEL WITH GFR
ALBUMIN: 3.8 g/dL (ref 3.6–5.1)
ALK PHOS: 94 U/L (ref 33–130)
ALT: 7 U/L (ref 6–29)
AST: 16 U/L (ref 10–35)
BUN: 13 mg/dL (ref 7–25)
CHLORIDE: 107 mmol/L (ref 98–110)
CO2: 24 mmol/L (ref 20–31)
Calcium: 9.9 mg/dL (ref 8.6–10.4)
Creat: 0.8 mg/dL (ref 0.60–0.93)
GFR, EST NON AFRICAN AMERICAN: 72 mL/min (ref >=60)
GFR, Est African American: 83 mL/min (ref >=60)
GLUCOSE: 81 mg/dL (ref 65–99)
Potassium: 4.7 mmol/L (ref 3.5–5.3)
SODIUM: 140 mmol/L (ref 135–146)
Total Bilirubin: 0.6 mg/dL (ref 0.2–1.2)
Total Protein: 6.7 g/dL (ref 6.1–8.1)

## 2017-05-12 LAB — LIPID PANEL
CHOL/HDL RATIO: 3.6 ratio (ref <5.0)
Cholesterol: 196 mg/dL (ref <200)
HDL: 54 mg/dL (ref >50)
LDL CALC: 126 mg/dL — AB (ref <100)
TRIGLYCERIDES: 80 mg/dL (ref <150)
VLDL: 16 mg/dL (ref <30)

## 2017-05-12 MED ORDER — TETANUS-DIPHTH-ACELL PERTUSSIS 5-2.5-18.5 LF-MCG/0.5 IM SUSP: 0.5000 mL | Freq: Once | INTRAMUSCULAR | 0 refills | Status: AC

## 2017-05-12 MED ORDER — ZOSTER VAC RECOMB ADJUVANTED 50 MCG/0.5ML IM SUSR: 0.5000 mL | Freq: Once | INTRAMUSCULAR | 1 refills | Status: AC

## 2017-05-12 NOTE — Patient Instructions (Addendum)
Prescription coupons can be found on [email or web address]  Will call with lab results  Will call with bone density appt  Follow up in 3 mos for HTN  Pneumovax given today

## 2017-05-12 NOTE — Progress Notes (Signed)
Patient ID: Erin Cunningham, female   DOB: Apr 07, 1941, 76 y.o.   MRN: [id number]    Location:  [facility] Place of Service: OFFICE  Chief Complaint  Patient presents with  . Medical Management of Chronic Issues    3 months routine visit    HPI:  76 yo female seen today for f/u. She has mammogram appt May 29, 2017. No new concerns  She has a persistent cough that is worse at night x 6 months. Cough interrupts sleep and radiates into ear. She was seen for an acute visit and rx Augmentin which helped a little. She does notice that when she chews tobacco at night the cough begans and does not stop even after spitting out tobacco. She does not use tobacco during the day time. She gets light headed at times. Occasional dysphagia with solids. She feels a lump in anterior neck. She is c/a cancer. She has been chewing tobacco since age 9. She underwent neck bx in Aug 2017 by ENT Dr Lucia Gaskins and results revealed no cancer cells  Vaginal dryness - she was unable to afford estrogen cream but plans to pick up rx from pharmacy soon. She plans to use a coupon.  BP controlled on lisinopril and HCTZ. No HA or dizziness. No CP or SOB. She does admit that she did not take her medication yet today. She was seen in the ED on 02/07/17 for HTN. BP at that time 253/84. She was tx with IV BP med and at d/c 171/84  Depression - stable on zoloft. She is followed by psych. No SI/HI  Past Medical History:  Diagnosis Date  . Breast CA ([place]) 2006   right [facility])  . Cancer ([employer])   . High blood pressure   . History of shingles   . Major depressive disorder, recurrent, severe without psychotic features ([place]) 06/24/2015  . Parotid mass 2014   right benign cyst  . Wears glasses     Past Surgical History:  Procedure Laterality Date  . ABDOMINAL HYSTERECTOMY  1982   BSO  . BREAST LUMPECTOMY WITH SENTINEL LYMPH NODE BIOPSY  2005   right  . BREAST SURGERY  2004   rt br mass  . CESAREAN SECTION    . COLONOSCOPY    . EYE  SURGERY     both cataracts  . PAROTIDECTOMY Right 09/05/2013   Procedure: RIGHT SUPERFICIAL PAROTIDECTOMY WITH FACIAL NERVE DISECTION;  Surgeon: [name], MD;  Location: [facility];  Service: ENT;  Laterality: Right;  All documentation done by R.Ward after [phone] --done under G. Garzon's password  . RE-EXCISION OF BREAST LUMPECTOMY  2005   right    Patient Care Team: Gildardo Cranker, DO as PCP - General (Internal Medicine) [name], MD as Consulting Physician (Otolaryngology)  Social History   Social History  . Marital status: Married    Spouse name: N/A  . Number of children: N/A  . Years of education: N/A   Occupational History  . Not on file.   Social History Main Topics  . Smoking status: Never Smoker  . Smokeless tobacco: Current User    Types: Chew  . Alcohol use 0.0 oz/week     Comment: occ  . Drug use: No  . Sexual activity: Not Currently   Other Topics Concern  . Not on file   Social History Narrative  . No narrative on file     reports that she has never smoked. Her smokeless tobacco use  includes Chew. She reports that she drinks alcohol. She reports that she does not use drugs.  Family History  Problem Relation Age of Onset  . Heart disease Mother        MI  . Cancer Father        bone  . Thyroid disease Neg Hx   . Hypercalcemia Neg Hx    Family Status  Relation Status  . Mother Deceased at age 63  . Father Deceased at age 63  . Neg Hx (Not Specified)     No Known Allergies  Medications: Patient's Medications  New Prescriptions   No medications on file  Previous Medications   CONJUGATED ESTROGENS (PREMARIN) VAGINAL CREAM    1 applicatorful PV 3 times per week   LISINOPRIL (PRINIVIL,ZESTRIL) 40 MG TABLET    Take 1 tablet (40 mg total) by mouth daily. Need to schedule an appointment.   SERTRALINE (ZOLOFT) 50 MG TABLET    Take 1 tablet (50 mg total) by mouth daily.   TDAP (BOOSTRIX) 5-2.5-18.5 LF-MCG/0.5  INJECTION    Inject 0.5 mLs into the muscle once.   ZOSTER VAC RECOMB ADJUVANTED (SHINGRIX) INJECTION    Inject 0.5 mLs into the muscle once.  Modified Medications   No medications on file  Discontinued Medications   ZOSTER VACCINE LIVE, PF, (ZOSTAVAX) 27253 UNT/0.65ML INJECTION    Inject 0.65 mLs into the skin once.    Review of Systems  Respiratory: Positive for cough.   Gastrointestinal: Positive for abdominal pain (x several yrs), diarrhea (with fatty foods) and nausea.  All other systems reviewed and are negative.   Vitals:   05/12/17 0935  BP: (!) 162/88  Pulse: 63  Temp: 97.6 F (36.4 C)  TempSrc: Oral  SpO2: 97%  Weight: 164 lb (74.4 kg)  Height: '5\' 3"'  (1.6 m)   Body mass index is 29.05 kg/m.  Physical Exam  Constitutional: She is oriented to person, place, and time. She appears well-developed and well-nourished.  HENT:  Mouth/Throat: Oropharynx is clear and moist. No oropharyngeal exudate.  No tongue lesions. No oral thrush  Eyes: Pupils are equal, round, and reactive to light. No scleral icterus.  Neck: Neck supple. Carotid bruit is not present. No tracheal deviation present. No thyromegaly present.  Cardiovascular: Normal rate, regular rhythm, normal heart sounds and intact distal pulses.  Exam reveals no gallop and no friction rub.   No murmur heard. No LE edema b/l. no calf TTP.   Pulmonary/Chest: Effort normal and breath sounds normal. No stridor. No respiratory distress. She has no wheezes. She has no rales.  Abdominal: Soft. Bowel sounds are normal. She exhibits no distension and no mass. There is no hepatomegaly. There is tenderness (RUQ with (+) Murphy's sign). There is no rebound and no guarding.  Lymphadenopathy:    She has no cervical adenopathy.  Neurological: She is alert and oriented to person, place, and time.  Skin: Skin is warm and dry. No rash noted.  Psychiatric: She has a normal mood and affect. Her behavior is normal. Judgment and thought  content normal.     Labs reviewed: No visits with results within 3 Month(s) from this visit.  Latest known visit with results is:  Admission on 02/07/2017, Discharged on 02/07/2017  Component Date Value Ref Range Status  . WBC 02/07/2017 5.6  4.0 - 10.5 K/uL Final  . RBC 02/07/2017 4.63  3.87 - 5.11 MIL/uL Final  . Hemoglobin 02/07/2017 13.4  12.0 - 15.0 g/dL Final  .  HCT 02/07/2017 40.2  36.0 - 46.0 % Final  . MCV 02/07/2017 86.8  78.0 - 100.0 fL Final  . MCH 02/07/2017 28.9  26.0 - 34.0 pg Final  . MCHC 02/07/2017 33.3  30.0 - 36.0 g/dL Final  . RDW 02/07/2017 13.6  11.5 - 15.5 % Final  . Platelets 02/07/2017 276  150 - 400 K/uL Final  . Neutrophils Relative % 02/07/2017 51  % Final  . Neutro Abs 02/07/2017 2.9  1.7 - 7.7 K/uL Final  . Lymphocytes Relative 02/07/2017 37  % Final  . Lymphs Abs 02/07/2017 2.1  0.7 - 4.0 K/uL Final  . Monocytes Relative 02/07/2017 8  % Final  . Monocytes Absolute 02/07/2017 0.4  0.1 - 1.0 K/uL Final  . Eosinophils Relative 02/07/2017 3  % Final  . Eosinophils Absolute 02/07/2017 0.2  0.0 - 0.7 K/uL Final  . Basophils Relative 02/07/2017 1  % Final  . Basophils Absolute 02/07/2017 0.0  0.0 - 0.1 K/uL Final  . Sodium 02/07/2017 139  135 - 145 mmol/L Final  . Potassium 02/07/2017 4.1  3.5 - 5.1 mmol/L Final  . Chloride 02/07/2017 107  101 - 111 mmol/L Final  . CO2 02/07/2017 26  22 - 32 mmol/L Final  . Glucose, Bld 02/07/2017 89  65 - 99 mg/dL Final  . BUN 02/07/2017 14  6 - 20 mg/dL Final  . Creatinine, Ser 02/07/2017 0.76  0.44 - 1.00 mg/dL Final  . Calcium 02/07/2017 10.2  8.9 - 10.3 mg/dL Final  . GFR calc non Af Amer 02/07/2017 >60  >60 mL/min Final  . GFR calc Af Amer 02/07/2017 >60  >60 mL/min Final   Comment: (NOTE) The eGFR has been calculated using the CKD EPI equation. This calculation has not been validated in all clinical situations. eGFR's persistently <60 mL/min signify possible Chronic Kidney Disease.   . Anion gap 02/07/2017 6   5 - 15 Final    No results found.   Assessment/Plan   ICD-10-CM   1. Essential hypertension, benign I10 TSH  2. Hyperlipidemia LDL goal <130 E78.5 TSH    Lipid Panel  3. Major depressive disorder, recurrent, severe without psychotic features ([place]) F33.2   4. Chewing tobacco nicotine dependence with nicotine-induced disorder F17.229   5. Right upper quadrant abdominal pain R10.11 US Abdomen Limited RUQ    CMP with eGFR  6. Estrogen deficiency E28.39 DG Bone Density  7. Need for 23-polyvalent pneumococcal polysaccharide vaccine Z23 Pneumococcal polysaccharide vaccine 23-valent greater than or equal to 2yo subcutaneous/IM   Prescription coupons can be found on GoodRx.com  Will call with lab results  Will call with bone density appt  Follow up in 3 mos for HTN  Pneumovax given today    Chanoch Mccleery S. Perlie Gold  [facility] and Adult [facility] [address] [place], [place] [address] [phone] Cell (Monday-Friday 8 AM - 5 PM) [phone] After 5 PM and follow prompts

## 2017-05-13 LAB — TSH: TSH: 1.01 mIU/L

## 2017-05-25 ENCOUNTER — Ambulatory Visit (INDEPENDENT_AMBULATORY_CARE_PROVIDER_SITE_OTHER): Payer: BC Managed Care – PPO | Admitting: Psychiatry

## 2017-05-25 ENCOUNTER — Encounter (HOSPITAL_COMMUNITY): Payer: Self-pay | Admitting: Psychiatry

## 2017-05-25 DIAGNOSIS — F332 Major depressive disorder, recurrent severe without psychotic features: Secondary | ICD-10-CM

## 2017-05-25 DIAGNOSIS — F419 Anxiety disorder, unspecified: Secondary | ICD-10-CM | POA: Diagnosis not present

## 2017-05-25 DIAGNOSIS — F17220 Nicotine dependence, chewing tobacco, uncomplicated: Secondary | ICD-10-CM

## 2017-05-25 MED ORDER — SERTRALINE HCL 50 MG PO TABS: 50.0000 mg | ORAL_TABLET | Freq: Every day | ORAL | 2 refills | Status: DC
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
## 2017-05-28 ENCOUNTER — Ambulatory Visit: Payer: Self-pay

## 2017-06-02 ENCOUNTER — Ambulatory Visit
Admission: RE | Admit: 2017-06-02 | Discharge: 2017-06-02 | Disposition: A | Discharge status: Home / Self Care | Payer: BC Managed Care – PPO | Source: Ambulatory Visit | Attending: Internal Medicine | Admitting: Internal Medicine

## 2017-06-02 DIAGNOSIS — R1011 Right upper quadrant pain: Secondary | ICD-10-CM

## 2017-06-08 ENCOUNTER — Telehealth: Payer: Self-pay

## 2017-06-08 ENCOUNTER — Other Ambulatory Visit: Payer: Self-pay

## 2017-06-08 ENCOUNTER — Ambulatory Visit
Admission: RE | Admit: 2017-06-08 | Discharge: 2017-06-08 | Disposition: A | Discharge status: Home / Self Care | Payer: BC Managed Care – PPO | Source: Ambulatory Visit | Attending: Internal Medicine | Admitting: Internal Medicine

## 2017-06-08 DIAGNOSIS — R1084 Generalized abdominal pain: Secondary | ICD-10-CM

## 2017-06-08 DIAGNOSIS — Z1231 Encounter for screening mammogram for malignant neoplasm of breast: Secondary | ICD-10-CM

## 2017-06-08 DIAGNOSIS — K824 Cholesterolosis of gallbladder: Secondary | ICD-10-CM

## 2017-06-08 DIAGNOSIS — E2839 Other primary ovarian failure: Secondary | ICD-10-CM

## 2017-06-08 DIAGNOSIS — R935 Abnormal findings on diagnostic imaging of other abdominal regions, including retroperitoneum: Secondary | ICD-10-CM

## 2017-06-08 HISTORY — DX: Personal history of irradiation: Z92.3

## 2017-06-08 NOTE — Telephone Encounter (Signed)
Patient walked in to follow-up on U/S results of abdomen; refer to report dated 06/02/17.  Patient was informed of results again and questioned the status of GI referral. Referral order was placed today while patient in office (associated diagnosis confirmed by Dr.Carter)  No further recommendations at this time, per Dr.Carter  Patient verbalized understanding of results and will await referral.

## 2017-06-10 ENCOUNTER — Encounter: Payer: Self-pay | Admitting: Internal Medicine

## 2017-07-12 ENCOUNTER — Other Ambulatory Visit: Payer: Self-pay

## 2017-07-12 DIAGNOSIS — I10 Essential (primary) hypertension: Secondary | ICD-10-CM

## 2017-07-12 MED ORDER — LISINOPRIL 40 MG PO TABS: 40.0000 mg | ORAL_TABLET | Freq: Every day | ORAL | 3 refills | Status: DC

## 2017-07-12 NOTE — Telephone Encounter (Signed)
Received request from [facility], wants 90 day supply.

## 2017-08-02 ENCOUNTER — Encounter: Payer: Self-pay | Admitting: Internal Medicine

## 2017-08-02 ENCOUNTER — Ambulatory Visit (INDEPENDENT_AMBULATORY_CARE_PROVIDER_SITE_OTHER): Payer: BC Managed Care – PPO | Admitting: Internal Medicine

## 2017-08-02 ENCOUNTER — Encounter (INDEPENDENT_AMBULATORY_CARE_PROVIDER_SITE_OTHER): Payer: Self-pay

## 2017-08-02 VITALS — BP 146/68 | HR 72 | Ht 63.0 in | Wt 166.0 lb

## 2017-08-02 DIAGNOSIS — Z86010 Personal history of colonic polyps: Secondary | ICD-10-CM

## 2017-08-02 DIAGNOSIS — G43A0 Cyclical vomiting, not intractable: Secondary | ICD-10-CM

## 2017-08-02 DIAGNOSIS — K824 Cholesterolosis of gallbladder: Secondary | ICD-10-CM | POA: Diagnosis not present

## 2017-08-02 DIAGNOSIS — R1013 Epigastric pain: Secondary | ICD-10-CM

## 2017-08-02 DIAGNOSIS — R197 Diarrhea, unspecified: Secondary | ICD-10-CM | POA: Diagnosis not present

## 2017-08-02 DIAGNOSIS — R1011 Right upper quadrant pain: Secondary | ICD-10-CM | POA: Diagnosis not present

## 2017-08-02 DIAGNOSIS — R1012 Left upper quadrant pain: Secondary | ICD-10-CM

## 2017-08-02 NOTE — Patient Instructions (Addendum)
You have been scheduled for an endoscopy and colonoscopy. Please follow the written instructions given to you at your visit today. Please pick up your prep supplies at the pharmacy. If you use inhalers (even only as needed), please bring them with you on the day of your procedure. Your physician has requested that you go to www.startemmi.com and enter the access code given to you at your visit today. This web site gives a general overview about your procedure. However, you should still follow specific instructions given to you by our office regarding your preparation for the procedure.  We will obtain your records from Dr Sammuel Cooper for review.    I appreciate the opportunity to care for you. Silvano Rusk, MD, [facility]

## 2017-08-02 NOTE — Progress Notes (Signed)
Erin Cunningham 76 y.o. 1941/07/10 [id number] Referred by: Gildardo Cranker, DO  Assessment & Plan:   Encounter Diagnoses  Name Primary?  . Cyclical vomiting with nausea, intractability of vomiting not specified Yes  . Diarrhea, unspecified type   . Abdominal pain, epigastric   . Hx of colonic polyps   . Gallbladder polyp   . Abdominal wall pain in both upper quadrants     Episodic problems with upper abdominal pain followed by urgent diarrhea and nausea with some vomiting. This is apparently a change. Chart review shows she has had some history of GI issues she has a history of H. pylori in 2001 that she does not remember. She also says she's had colonoscopy about every 5 years due to a history of colon polyps.  Given her age, history and these symptoms I think it makes sense to perform an EGD and a colonoscopy.The risks and benefits as well as alternatives of endoscopic procedure(s) have been discussed and reviewed. All questions answered. The patient agrees to proceed.   I will review previous colonoscopy and endoscopy records as well we will request these from her prior gastroenterologist.  Please have returned after her visit, she had a normal colonoscopy to the cecum in 2004, no other record of colonoscopy  Gallbladder polyp is noted it was present on a 2001 ultrasound so is stable and not a clinical issue.  He has upper abdominal tenderness that I think is musculoskeletal and abdominal wall tenderness. Probably from coughing and perhaps nausea and vomiting.   I appreciate the opportunity to care for this patient. CC: Gildardo Cranker, DO  Subjective:   Chief Complaint: Gallbladder polyp, nausea vomiting diarrhea abdominal pain  HPI Patient is a very nice 76 year old married African-American woman here to evaluate abnormal gallbladder ultrasound that recently showed a 4 mm polyp. She was evaluated by her primary care provider and had tenderness in upper abdominal area so  an ultrasound was ordered. 06/02/2017 limited right upper quadrant ultrasound showed a 4 mm gallbladder polyp. This was also present in 2001 and 2004 ultrasound exams. The patient does report about 6 times a year getting episodes of sudden onset of epigastric cramps urgent diarrhea and nausea and some mild vomiting. It last for a few hours. She cannot isolate a trigger it could be eating out or eating at home it just depends. It is a new issue in the last year or so. She does relate a history of having had a colonoscopy about every 5 years due to colon polyps. She cannot remember the name of her gastroenterologist but records review shows that she is to see Dr. Sammuel Cooper and I can see where she had H. pylori gastritis in 2001. She does not recall that. She's had CT scans and upper GI series over the years as well. However most of that relates to around the time she had her breast cancer around 2004 and 2005. Not much activity since. She does not recall being diagnosed with irritable bowel syndrome No Known Allergies Current Meds  Medication Sig  . calcium-vitamin D (OSCAL WITH D) 500-200 MG-UNIT tablet Take 1 tablet by mouth daily.  Marland Kitchen lisinopril (PRINIVIL,ZESTRIL) 40 MG tablet Take 1 tablet (40 mg total) by mouth daily. Need to schedule an appointment.  . sertraline (ZOLOFT) 50 MG tablet Take 1 tablet (50 mg total) by mouth daily.   Past Medical History:  Diagnosis Date  . Breast CA ([place]) 2006   right [facility])  . Cancer ([place])   .  Helicobacter pylori gastritis 2001  . High blood pressure   . History of shingles   . Major depressive disorder, recurrent, severe without psychotic features ([place]) 06/24/2015  . Parotid mass 2014   right benign cyst  . Personal history of radiation therapy 2006   Rt breast  . Wears glasses    Past Surgical History:  Procedure Laterality Date  . ABDOMINAL HYSTERECTOMY  1982   BSO  . BREAST LUMPECTOMY WITH SENTINEL LYMPH NODE BIOPSY  2005   right  . BREAST SURGERY   2004   rt br mass  . CESAREAN SECTION    . COLONOSCOPY    . ESOPHAGOGASTRODUODENOSCOPY  2001   H pylori gastritis  . EYE SURGERY     both cataracts  . PAROTIDECTOMY Right 09/05/2013   Procedure: RIGHT SUPERFICIAL PAROTIDECTOMY WITH FACIAL NERVE DISECTION;  Surgeon: [name], MD;  Location: [place];  Service: ENT;  Laterality: Right;  All documentation done by R.Ward after [phone] --done under G. Garzon's password  . RE-EXCISION OF BREAST LUMPECTOMY  2005   right   Social History   Social History  . Marital status: Married    Spouse name: N/A  . Number of children: 3  . Years of education: At least some college   Occupational History  . 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 Smoking status: Never Smoker  . Smokeless tobacco: Current User    Types: Chew  . Alcohol use 0.0 oz/week     Comment: occ  . Drug use: No  . 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  Review of Systems  Right ear pain anxiety depression coughing. Insomnia. Root canal one month ago. All other review of systems negative or as per history of present illness. Objective:   Physical Exam @BP  (!) 146/68   Pulse 72   Ht 5\' 3"  (1.6 m)   Wt 166 lb (75.3 kg)   BMI 29.41 kg/m @  General:  Well-developed, well-nourished and in no acute distress Eyes:  anicteric. ENT:   Mouth and posterior pharynx free of lesions.  Neck:   supple w/o thyromegaly or mass.  Lungs: Clear to auscultation bilaterally. Heart:  S1S2, no rubs, murmurs, gallops. Abdomen:  soft, mildly tender  diffusely over the upper abdomen with a positive carnett sign, no hepatosplenomegaly, hernia, or mass and BS+.  Rectal:  This is deferred until colonoscopy Lymph:  no cervical or supraclavicular adenopathy. Extremities:   no edema, cyanosis or clubbing Skin   no rash. She does have markedly onychomycotic changes of the toenails Neuro:  A&O x 3.  Psych:  appropriate mood and  Affect.   Data Reviewed: See history of present illness I've reviewed primary care notes ultrasounds and other imaging studies throughout the years and pathology reports as well as labs currently in the EMR. In March CBC and chemistries were all normal. As well as June. Chemistries only June.

## 2017-08-13 ENCOUNTER — Ambulatory Visit: Payer: BC Managed Care – PPO | Admitting: Internal Medicine

## 2017-08-25 ENCOUNTER — Encounter (HOSPITAL_COMMUNITY): Payer: Self-pay | Admitting: Psychiatry

## 2017-08-25 ENCOUNTER — Ambulatory Visit (INDEPENDENT_AMBULATORY_CARE_PROVIDER_SITE_OTHER): Payer: BC Managed Care – PPO | Admitting: Psychiatry

## 2017-08-25 DIAGNOSIS — F332 Major depressive disorder, recurrent severe without psychotic features: Secondary | ICD-10-CM | POA: Diagnosis not present

## 2017-08-25 DIAGNOSIS — F419 Anxiety disorder, unspecified: Secondary | ICD-10-CM

## 2017-08-25 DIAGNOSIS — R12 Heartburn: Secondary | ICD-10-CM | POA: Diagnosis not present

## 2017-08-25 MED ORDER — SERTRALINE HCL 50 MG PO TABS: 50.0000 mg | ORAL_TABLET | Freq: Every day | ORAL | 2 refills | Status: DC

## 2017-08-25 NOTE — Progress Notes (Signed)
BH MD/PA/NP OP Progress Note  08/25/2017 8:45 AM Erin Cunningham  MRN:  [id number]  Chief Complaint:  Chief Complaint    Follow-up     HPI: Patient came for her.  Appointment.  She is taking Zoloft and denies any side effects.  She had a good summer.  She had 3 grandkids and she enjoyed the time with them.  She is more active social.  She started leaving her house when she is stressed and try to keep herself busy.  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  Patient told her husband remains the same and continues to fuss sometimes on money but she is happy because now she is making the money.  Patient told recently they have to file Chapter 13 because they were under because of second mortgage.  Since they thoughts.  Her 13 they are more relaxed and not under a lot of stress.  She sleeping good.  She denies any irritability, anger, mania or any psychosis.  Her energy level is good.  Recently she's seen GI and she scheduled to have procedure on October 10.  She denies any major panic attack.  She denies any crying spells or any feeling of hopelessness or worthlessness.  She has no tremors shakes or any EPS.  Her appetite is okay.  Her vital signs are stable.  Visit Diagnosis:    ICD-10-CM   1. Major depressive disorder, recurrent severe without psychotic features ([place]) F33.2 sertraline (ZOLOFT) 50 MG tablet    Past Psychiatric History: Reviewed. Patient has history of depression and anxiety. She has seen this Probation officer. Years ago. She has history of one psychiatric hospitalization at [facility] few years ago due to severe depression and having suicidal thoughts. She was also briefly admitted at OBS unit in 2016. Patient denies any history of mania, psychosis, hallucination or any suicidal attempt.  Past Medical History:  Past Medical History:  Diagnosis Date  . Breast CA ([place]) 2006   right [facility])  . Cancer ([place])   . Helicobacter pylori gastritis 2001  . High blood pressure   .  History of shingles   . Major depressive disorder, recurrent, severe without psychotic features ([place]) 06/24/2015  . Parotid mass 2014   right benign cyst  . Personal history of radiation therapy 2006   Rt breast  . Wears glasses     Past Surgical History:  Procedure Laterality Date  . ABDOMINAL HYSTERECTOMY  1982   BSO  . BREAST LUMPECTOMY WITH SENTINEL LYMPH NODE BIOPSY  2005   right  . BREAST SURGERY  2004   rt br mass  . CESAREAN SECTION    . COLONOSCOPY    . ESOPHAGOGASTRODUODENOSCOPY  2001   H pylori gastritis  . EYE SURGERY     both cataracts  . PAROTIDECTOMY Right 09/05/2013   Procedure: RIGHT SUPERFICIAL PAROTIDECTOMY WITH FACIAL NERVE DISECTION;  Surgeon: [name], MD;  Location: [place];  Service: ENT;  Laterality: Right;  All documentation done by R.Ward after [phone] --done under G. Garzon's password  . RE-EXCISION OF BREAST LUMPECTOMY  2005   right    Family Psychiatric History: Reviewed.  Family History:  Family History  Problem Relation Age of Onset  . Heart disease Mother        MI  . Cancer Father        bone  . Thyroid disease Neg Hx   . Hypercalcemia Neg Hx     Social  History:  Social History   Social History  . Marital status: Married    Spouse name: N/A  . Number of children: 3  . Years of education: At least some college   Occupational History  . 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 Smoking status: Never Smoker  . Smokeless tobacco: Former Systems developer    Types: Chew    Quit date: 08/18/2017  . Alcohol use 0.0 oz/week     Comment: occ  . Drug use: No  . Sexual activity: Not Currently   Other Topics Concern  . 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 [place]    1 coffee one soft drink daily   08/02/2017       Allergies: No Known Allergies  Metabolic Disorder Labs: No results found for: HGBA1C, MPG No results found for: PROLACTIN Lab Results  Component Value Date   CHOL 196 05/12/2017   TRIG 80 05/12/2017   HDL 54 05/12/2017   CHOLHDL 3.6 05/12/2017   VLDL 16 05/12/2017   LDLCALC 126 (H) 05/12/2017   LDLCALC 135 (H) 12/02/2016   Lab Results  Component Value Date   TSH 1.01 05/12/2017   TSH 0.72 04/20/2016    Therapeutic Level Labs: No results found for: LITHIUM No results found for: VALPROATE No components found for:  CBMZ  Current Medications: Current Outpatient Prescriptions  Medication Sig Dispense Refill  . calcium-vitamin D (OSCAL WITH D) 500-200 MG-UNIT tablet Take 1 tablet by mouth daily.    Marland Kitchen lisinopril (PRINIVIL,ZESTRIL) 40 MG tablet Take 1 tablet (40 mg total) by mouth daily. Need to schedule an appointment. 90 tablet 3  . sertraline (ZOLOFT) 50 MG tablet Take 1 tablet (50 mg total) by mouth daily. 30 tablet 2   No current facility-administered medications for this visit.      Musculoskeletal: Strength & Muscle Tone: within normal limits Gait & Station: normal Patient leans: N/A  Psychiatric Specialty Exam: Review of Systems  Constitutional: Negative.   HENT: Negative.   Gastrointestinal: Positive for heartburn.  Skin: Negative.   Neurological: Negative.     Blood pressure 140/86, pulse 87, height 5\' 3"  (1.6 m), weight 166 lb 6.4 oz (75.5 kg).Body mass index is 29.48 kg/m.  General Appearance: Casual  Eye Contact:  Good  Speech:  Clear and Coherent  Volume:  Normal  Mood:  Euthymic  Affect:  Appropriate  Thought Process:  Goal Directed  Orientation:  Full (Time, Place, and Person)  Thought Content: Logical   Suicidal Thoughts:  No  Homicidal Thoughts:  No  Memory:  Immediate;   Good Recent;   Good Remote;   Good  Judgement:  Good  Insight:  Good  Psychomotor Activity:  Normal   Concentration:  Concentration: Good and Attention Span: Good  Recall:  Good  Fund of Knowledge: Good  Language: Good  Akathisia:  No  Handed:  Right  AIMS (if indicated): not done  Assets:  Communication Skills Desire for [place] Talents/Skills Transportation  ADL's:  Intact  Cognition: WNL  Sleep:  Good   Screenings: AIMS     Admission (Discharged) from 06/24/2015 in [place]  AIMS Total Score  0  AUDIT     Admission (Discharged) from 06/24/2015 in [facility] [place] [facility]  Alcohol Use Disorder Identification Test Final Score (AUDIT)  1    Mini-Mental     Office Visit from confidential encounter on 02/15/2015  Total Score (max 30 points )  29    PHQ2-9     Office Visit from confidential encounter on 12/02/2016 Office Visit from confidential encounter on 02/15/2015 Office Visit from confidential encounter on 01/16/2015  PHQ-2 Total Score  2  0  2  PHQ-9 Total Score  -  -  2       Assessment and Plan: Major depressive disorder, recurrent.  Anxiety disorder NOS.  Patient doing better on Zoloft.  She has no side effects.  She likes her job and she liked to keep herself busy.  Patient is not interested in counseling.  Discussed medication side effects and benefits.  Recommended to continue Zoloft 50 mg daily.  Recommended to call us back if she has any question, concern or if she feels worsening of the symptom.  Follow-up in 3 months.   Ashari Llewellyn T., MD 08/25/2017, 8:45 AM

## 2017-09-01 ENCOUNTER — Encounter: Payer: Self-pay | Admitting: Internal Medicine

## 2017-09-15 ENCOUNTER — Ambulatory Visit: Payer: BC Managed Care – PPO | Admitting: Internal Medicine

## 2017-09-15 ENCOUNTER — Encounter: Payer: Self-pay | Admitting: Internal Medicine

## 2017-09-15 ENCOUNTER — Ambulatory Visit (AMBULATORY_SURGERY_CENTER): Payer: BC Managed Care – PPO | Admitting: Internal Medicine

## 2017-09-15 ENCOUNTER — Telehealth: Payer: Self-pay

## 2017-09-15 VITALS — BP 179/97 | HR 75 | Temp 98.2°F | Resp 16 | Ht 63.0 in | Wt 166.0 lb

## 2017-09-15 DIAGNOSIS — R197 Diarrhea, unspecified: Secondary | ICD-10-CM | POA: Diagnosis present

## 2017-09-15 DIAGNOSIS — D120 Benign neoplasm of cecum: Secondary | ICD-10-CM

## 2017-09-15 DIAGNOSIS — R1013 Epigastric pain: Secondary | ICD-10-CM

## 2017-09-15 MED ORDER — SODIUM CHLORIDE 0.9 % IV SOLN: 500.0000 mL | INTRAVENOUS | Status: DC

## 2017-09-15 NOTE — Progress Notes (Signed)
Called to room to assist during endoscopic procedure.  Patient ID and intended procedure confirmed with present staff. Received instructions for my participation in the procedure from the performing physician.  

## 2017-09-15 NOTE — Telephone Encounter (Signed)
Left message on voicemail for patient to return call when available   

## 2017-09-15 NOTE — Progress Notes (Signed)
Report given to PACU, vss 

## 2017-09-15 NOTE — Op Note (Signed)
[place] Patient Name: Erin Cunningham Procedure Date: 09/15/2017 1:59 PM MRN: [id number] Endoscopist: Gatha Mayer , MD Age: 76 Referring MD:  Date of Birth: 1941/02/16 Gender: Female Account #: [id number] Procedure:                Colonoscopy Indications:              Clinically significant diarrhea of unexplained                            origin Medicines:                Propofol per Anesthesia, Monitored Anesthesia Care Procedure:                Pre-Anesthesia Assessment:                           - Prior to the procedure, a History and Physical                            was performed, and patient medications and                            allergies were reviewed. The patient's tolerance of                            previous anesthesia was also reviewed. The risks                            and benefits of the procedure and the sedation                            options and risks were discussed with the patient.                            All questions were answered, and informed consent                            was obtained. Prior Anticoagulants: The patient has                            taken no previous anticoagulant or antiplatelet                            agents. ASA Grade Assessment: II - A patient with                            mild systemic disease. After reviewing the risks                            and benefits, the patient was deemed in                            satisfactory condition to undergo the procedure.  After obtaining informed consent, the colonoscope                            was passed under direct vision. Throughout the                            procedure, the patient's blood pressure, pulse, and                            oxygen saturations were monitored continuously. The                            Colonoscope was introduced through the anus and                            advanced to the the terminal  ileum, with                            identification of the appendiceal orifice and IC                            valve. The colonoscopy was performed without                            difficulty. The patient tolerated the procedure                            well. The quality of the bowel preparation was                            excellent. The ileocecal valve, appendiceal                            orifice, and rectum were photographed. Scope In: 2:12:34 PM Scope Out: 2:26:31 PM Scope Withdrawal Time: 0 hours 11 minutes 18 seconds  Total Procedure Duration: 0 hours 13 minutes 57 seconds  Findings:                 The perianal and digital rectal examinations were                            normal.                           The terminal ileum appeared normal.                           A diminutive polyp was found in the ileocecal                            valve. The polyp was sessile. The polyp was removed                            with a cold snare. Resection and retrieval were  complete. Verification of patient identification                            for the specimen was done. Estimated blood loss was                            minimal.                           Scattered diverticula were found in the left colon                            and right colon.                           The exam was otherwise without abnormality on                            direct and retroflexion views. Complications:            No immediate complications. Estimated Blood Loss:     Estimated blood loss was minimal. Impression:               - The examined portion of the ileum was normal.                           - One diminutive polyp at the ileocecal valve,                            removed with a cold snare. Resected and retrieved.                           - Diverticulosis in the left colon and in the right                            colon.                            - The examination was otherwise normal on direct                            and retroflexion views. Recommendation:           - Patient has a contact number available for                            emergencies. The signs and symptoms of potential                            delayed complications were discussed with the                            patient. Return to normal activities tomorrow.                            Written discharge instructions were provided to the  patient.                           - Resume previous diet.                           - Continue present medications.                           - No likely repeat colonoscopy due to age and only                            diminutive polyp - await pathology. Gatha Mayer, MD 09/15/2017 2:38:06 PM This report has been signed electronically.

## 2017-09-15 NOTE — Patient Instructions (Addendum)
The esophagus, stomach and upper intestine are normal.  I found a tiny colon polyp (removed) and diverticulosis.  I will let you know pathology results and when or if to have another routine colonoscopy by mail and/or My Chart. I do not think you will need another routine colonoscopy.  I appreciate the opportunity to care for you. Gatha Mayer, MD, FACG     YOU HAD AN ENDOSCOPIC PROCEDURE TODAY AT [facility]:   Refer to the procedure report that was given to you for any specific questions about what was found during the examination.  If the procedure report does not answer your questions, please call your gastroenterologist to clarify.  If you requested that your care partner not be given the details of your procedure findings, then the procedure report has been included in a sealed envelope for you to review at your convenience later.  YOU SHOULD EXPECT: Some feelings of bloating in the abdomen. Passage of more gas than usual.  Walking can help get rid of the air that was put into your GI tract during the procedure and reduce the bloating. If you had a lower endoscopy (such as a colonoscopy or flexible sigmoidoscopy) you may notice spotting of blood in your stool or on the toilet paper. If you underwent a bowel prep for your procedure, you may not have a normal bowel movement for a few days.  Please Note:  You might notice some irritation and congestion in your nose or some drainage.  This is from the oxygen used during your procedure.  There is no need for concern and it should clear up in a day or so.  SYMPTOMS TO REPORT IMMEDIATELY:   Following lower endoscopy (colonoscopy or flexible sigmoidoscopy):  Excessive amounts of blood in the stool  Significant tenderness or worsening of abdominal pains  Swelling of the abdomen that is new, acute  Fever of 100F or higher   Following upper endoscopy (EGD)  Vomiting of blood or coffee ground material  New chest  pain or pain under the shoulder blades  Painful or persistently difficult swallowing  New shortness of breath  Fever of 100F or higher  Black, tarry-looking stools  For urgent or emergent issues, a gastroenterologist can be reached at any hour by calling [phone].   DIET:  We do recommend a small meal at first, but then you may proceed to your regular diet.  Drink plenty of fluids but you should avoid alcoholic beverages for 24 hours.  ACTIVITY:  You should plan to take it easy for the rest of today and you should NOT DRIVE or use heavy machinery until tomorrow (because of the sedation medicines used during the test).    FOLLOW UP: Our staff will call the number listed on your records the next business day following your procedure to check on you and address any questions or concerns that you may have regarding the information given to you following your procedure. If we do not reach you, we will leave a message.  However, if you are feeling well and you are not experiencing any problems, there is no need to return our call.  We will assume that you have returned to your regular daily activities without incident.  If any biopsies were taken you will be contacted by phone or by letter within the next 1-3 weeks.  Please call us at [phone] if you have not heard about the biopsies in 3 weeks.  SIGNATURES/CONFIDENTIALITY: You and/or your care partner have signed paperwork which will be entered into your electronic medical record.  These signatures attest to the fact that that the information above on your After Visit Summary has been reviewed and is understood.  Full responsibility of the confidentiality of this discharge information lies with you and/or your care-partner.    Handouts were given to your care partner on polyps and diverticulosis. You may resume your current medications today. Await biopsy results. Please call if any questions or concerns.

## 2017-09-15 NOTE — Progress Notes (Signed)
No problems noted in the recovery room. maw 

## 2017-09-15 NOTE — Op Note (Signed)
[place] Patient Name: Erin Cunningham Procedure Date: 09/15/2017 2:00 PM MRN: [id number] Endoscopist: Gatha Mayer , MD Age: 76 Referring MD:  Date of Birth: [birth year] Gender: Female Account #: [id number] Procedure:                Upper GI endoscopy Indications:              Epigastric abdominal pain Medicines:                Propofol per Anesthesia, Monitored Anesthesia Care Procedure:                Pre-Anesthesia Assessment:                           - Prior to the procedure, a History and Physical                            was performed, and patient medications and                            allergies were reviewed. The patient's tolerance of                            previous anesthesia was also reviewed. The risks                            and benefits of the procedure and the sedation                            options and risks were discussed with the patient.                            All questions were answered, and informed consent                            was obtained. Prior Anticoagulants: The patient has                            taken no previous anticoagulant or antiplatelet                            agents. ASA Grade Assessment: II - A patient with                            mild systemic disease. After reviewing the risks                            and benefits, the patient was deemed in                            satisfactory condition to undergo the procedure.                           After obtaining informed consent, the endoscope was  passed under direct vision. Throughout the                            procedure, the patient's blood pressure, pulse, and                            oxygen saturations were monitored continuously. The                            Endoscope was introduced through the mouth, and                            advanced to the second part of duodenum. The upper                            GI  endoscopy was accomplished without difficulty.                            The patient tolerated the procedure well. Scope In: Scope Out: Findings:                 The esophagus was normal.                           The stomach was normal.                           The examined duodenum was normal.                           The cardia and gastric fundus were normal on                            retroflexion. Complications:            No immediate complications. Estimated Blood Loss:     Estimated blood loss: none. Impression:               - Normal esophagus.                           - Normal stomach.                           - Normal examined duodenum.                           - No specimens collected. Recommendation:           - Patient has a contact number available for                            emergencies. The signs and symptoms of potential                            delayed complications were discussed with the  patient. Return to normal activities tomorrow.                            Written discharge instructions were provided to the                            patient.                           - Resume previous diet.                           - Continue present medications.                           - See the other procedure note for documentation of                            additional recommendations. Gatha Mayer, MD 09/15/2017 2:35:33 PM This report has been signed electronically.

## 2017-09-15 NOTE — Telephone Encounter (Signed)
Unfortunately, there are no alternatives to premarin as all meds that can help with vaginal dryness  carry the same warning.

## 2017-09-15 NOTE — Telephone Encounter (Signed)
I called patient to inquire about missed appointment today with Dr.Carter, patient was prepping for a colonoscopy scheduled for today and was unable to make appointment (patient forgot to call and reschedule)  Appointment was rescheduled to 10/27/17  Patient then asked if Dr.Carter would rx an alternative to premarin. Premarin was prescribed for vaginal dryness, yet patient never picked up rx for there was an indication "If you every had cancer not to use."   Patient states she still suffers from vaginal dryness and as a results intercourse is painful  Please advise

## 2017-09-16 ENCOUNTER — Telehealth: Payer: Self-pay | Admitting: *Deleted

## 2017-09-16 NOTE — Telephone Encounter (Signed)
  Follow up Call-  Call back number 09/15/2017  Post procedure Call Back phone  # [phone]  Permission to leave phone message Yes  Some recent data might be hidden     Patient questions:  Message left to call us if necessary.

## 2017-09-16 NOTE — Telephone Encounter (Signed)
  Follow up Call-  Call back number 09/15/2017  Post procedure Call Back phone  # [phone]  Permission to leave phone message Yes  Some recent data might be hidden     Patient questions:  Do you have a fever, pain , or abdominal swelling? No. Pain Score  0 *  Have you tolerated food without any problems? Yes.    Have you been able to return to your normal activities? Yes.    Do you have any questions about your discharge instructions: Diet   No. Medications  No. Follow up visit  No.  Do you have questions or concerns about your Care? No.  Actions: * If pain score is 4 or above: No action needed, pain <4.

## 2017-09-16 NOTE — Telephone Encounter (Signed)
Spoke with patient, patient verbalized understanding of response  ?

## 2017-09-23 ENCOUNTER — Encounter: Payer: Self-pay | Admitting: Internal Medicine

## 2017-09-23 DIAGNOSIS — Z860101 Personal history of adenomatous and serrated colon polyps: Secondary | ICD-10-CM

## 2017-09-23 DIAGNOSIS — Z86010 Personal history of colonic polyps: Secondary | ICD-10-CM | POA: Insufficient documentation

## 2017-09-23 HISTORY — DX: Personal history of colonic polyps: Z86.010

## 2017-09-23 HISTORY — DX: Personal history of adenomatous and serrated colon polyps: Z86.0101

## 2017-09-23 NOTE — Progress Notes (Signed)
Diminutive adenoma No recall due to age

## 2017-10-27 ENCOUNTER — Ambulatory Visit: Payer: Self-pay | Admitting: Internal Medicine

## 2017-11-24 ENCOUNTER — Ambulatory Visit (INDEPENDENT_AMBULATORY_CARE_PROVIDER_SITE_OTHER): Payer: BC Managed Care – PPO | Admitting: Psychiatry

## 2017-11-24 ENCOUNTER — Encounter (HOSPITAL_COMMUNITY): Payer: Self-pay | Admitting: Psychiatry

## 2017-11-24 VITALS — BP 128/76 | HR 73 | Ht 63.0 in | Wt 167.8 lb

## 2017-11-24 DIAGNOSIS — F332 Major depressive disorder, recurrent severe without psychotic features: Secondary | ICD-10-CM | POA: Diagnosis not present

## 2017-11-24 DIAGNOSIS — F419 Anxiety disorder, unspecified: Secondary | ICD-10-CM | POA: Diagnosis not present

## 2017-11-24 DIAGNOSIS — Z79899 Other long term (current) drug therapy: Secondary | ICD-10-CM

## 2017-11-24 MED ORDER — SERTRALINE HCL 50 MG PO TABS: 50.0000 mg | ORAL_TABLET | Freq: Every day | ORAL | 2 refills | Status: DC

## 2017-11-24 NOTE — Progress Notes (Signed)
BH MD/PA/NP OP Progress Note  11/24/2017 8:37 AM Erin Cunningham  MRN:  [id number]  Chief Complaint: I am doing good.  I taking my medication.  HPI: Erin Cunningham came for her follow-up appointment.  She is compliant with Zoloft and denies any side effects.  She continues to have medical issues but she is handling the stress very well.  She admitted that sometimes she have to leave the house when her husband is making her crazy.  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  Patient denies any agitation, anger, mania, psychosis or any hallucination.  She denies any physical abuse from her husband.  She excited about Christmas.  Her grandson are coming to visit her.  She is sleeping good and does not require any sleep medication.  Her appetite is okay.  Her energy level is good.  She denies any crying spells or any feeling of hopelessness or worthlessness.  She denies any major panic attack.  She has no tremors shakes or any EPS.  Her vital signs are stable.  Visit Diagnosis:    ICD-10-CM   1. Major depressive disorder, recurrent severe without psychotic features ([place]) F33.2 sertraline (ZOLOFT) 50 MG tablet    Past Psychiatric History: Viewed. Patient has history of depression and anxiety. She has seen this Probation officer. Years ago. She has history of one psychiatric hospitalization at [facility] few years ago due to severe depression and having suicidal thoughts. She was also briefly admitted at OBS unit in 2016. Patient denies any history of mania, psychosis, hallucination or any suicidal attempt.  Past Medical History:  Past Medical History:  Diagnosis Date  . Breast CA ([place]) 2006   right [facility])  . Cancer ([place])   . Helicobacter pylori gastritis 2001  . High blood pressure   . History of adenomatous polyp of colon 09/23/2017   Oma 09/25/2017 and apparently polyps in the past as well though recalled age  . History of shingles   . Major depressive disorder, recurrent, severe without psychotic  features ([place]) 06/24/2015  . Parotid mass 2014   right benign cyst  . Personal history of radiation therapy 2006   Rt breast  . Wears glasses     Past Surgical History:  Procedure Laterality Date  . ABDOMINAL HYSTERECTOMY  1982   BSO  . BREAST LUMPECTOMY WITH SENTINEL LYMPH NODE BIOPSY  2005   right  . BREAST SURGERY  2004   rt br mass  . CESAREAN SECTION    . COLONOSCOPY    . ESOPHAGOGASTRODUODENOSCOPY  2001   H pylori gastritis  . EYE SURGERY     both cataracts  . PAROTIDECTOMY Right 09/05/2013   Procedure: RIGHT SUPERFICIAL PAROTIDECTOMY WITH FACIAL NERVE DISECTION;  Surgeon: [name], MD;  Location: [facility];  Service: ENT;  Laterality: Right;  All documentation done by R.Ward after [phone] --done under G. Garzon's password  . RE-EXCISION OF BREAST LUMPECTOMY  2005   right    Family Psychiatric History: Reviewed.  Family History:  Family History  Problem Relation Age of Onset  . Heart disease Mother        MI  . Cancer Father        bone  . Thyroid disease Neg Hx   . Hypercalcemia Neg Hx   . Colon cancer Neg Hx   . Stomach cancer Neg Hx   . Rectal cancer Neg Hx   . Esophageal cancer Neg Hx     Social History:  Social History   Socioeconomic History  . Marital status: Married    Spouse name: Not on file  . Number of children: 3  . Years of education: At least some college  . Highest education level: Not on file  Social Needs  . Financial resource strain: Not on file  . Food insecurity - worry: Not on file  . Food insecurity - inability: Not on file  . Transportation needs - medical: Not on file  . Transportation needs - non-medical: Not on file  Occupational History  . Occupation: Middle school Scientist, research (physical sciences): [place]  Tobacco Use  . Smoking status: Never Smoker  . Smokeless tobacco: Former Systems developer    Types: Chew  Substance and Sexual Activity  . Alcohol use: Yes    Alcohol/week: 0.0 oz     Comment: occ  . Drug use: No  . Sexual activity: Not Currently  Other Topics Concern  . 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 [place]   1 coffee one soft drink daily   08/02/2017    Allergies: No Known Allergies  Metabolic Disorder Labs: No results found for: HGBA1C, MPG No results found for: PROLACTIN Lab Results  Component Value Date   CHOL 196 05/12/2017   TRIG 80 05/12/2017   HDL 54 05/12/2017   CHOLHDL 3.6 05/12/2017   VLDL 16 05/12/2017   LDLCALC 126 (H) 05/12/2017   LDLCALC 135 (H) 12/02/2016   Lab Results  Component Value Date   TSH 1.01 05/12/2017   TSH 0.72 04/20/2016    Therapeutic Level Labs: No results found for: LITHIUM No results found for: VALPROATE No components found for:  CBMZ  Current Medications: Current Outpatient Medications  Medication Sig Dispense Refill  . calcium-vitamin D (OSCAL WITH D) 500-200 MG-UNIT tablet Take 1 tablet by mouth daily.    Marland Kitchen lisinopril (PRINIVIL,ZESTRIL) 40 MG tablet Take 1 tablet (40 mg total) by mouth daily. Need to schedule an appointment. 90 tablet 3  . sertraline (ZOLOFT) 50 MG tablet Take 1 tablet (50 mg total) by mouth daily. 30 tablet 2   No current facility-administered medications for this visit.      Musculoskeletal: Strength & Muscle Tone: within normal limits Gait & Station: normal Patient leans: N/A  Psychiatric Specialty Exam: ROS  Blood pressure 128/76, pulse 73, height 5\' 3"  (1.6 m), weight 167 lb 12.8 oz (76.1 kg).There is no height or weight on file to calculate BMI.  General Appearance: Casual  Eye Contact:  Good  Speech:  Clear and Coherent  Volume:  Normal  Mood:  Euthymic  Affect:  Congruent  Thought Process:  Goal Directed  Orientation:  Full (Time, Place, and Person)   Thought Content: Logical   Suicidal Thoughts:  No  Homicidal Thoughts:  No  Memory:  Immediate;   Good Recent;   Good Remote;   Good  Judgement:  Good  Insight:  Good  Psychomotor Activity:  Normal  Concentration:  Concentration: Good and Attention Span: Good  Recall:  Good  Fund of Knowledge: Good  Language: Good  Akathisia:  No  Handed:  Right  AIMS (if indicated): not done  Assets:  Communication Skills Desire for Improvement Housing Resilience  ADL's:  Intact  Cognition: WNL  Sleep:  Good   Screenings: AIMS     Admission (Discharged) from 06/24/2015 in [facility]  AIMS Total Score  0    AUDIT     Admission (Discharged) from 06/24/2015 in [facility]  Alcohol Use Disorder Identification Test Final Score (AUDIT)  1    Mini-Mental     Office Visit from confidential encounter on 02/15/2015  Total Score (max 30 points )  29    PHQ2-9     Office Visit from confidential encounter on 12/02/2016 Office Visit from confidential encounter on 02/15/2015 Office Visit from confidential encounter on 01/16/2015  PHQ-2 Total Score  2  0  2  PHQ-9 Total Score  No data  No data  2       Assessment and Plan: Major depressive disorder, recurrent.  Anxiety disorder NOS.  Patient is a stable on her current psychiatric medication.  She does not want to change the dose.  Continue Zoloft 50 mg daily.  Discussed medication side effects and benefits.  Patient is not interested in counseling.  Recommended to call us back if she has any question or any concern.  Follow-up in 3 months.  Discussed healthy lifestyle and continue to watch her calorie intake and do regular exercise.   [name], MD 11/24/2017, 8:37 AM

## 2018-02-22 ENCOUNTER — Encounter (HOSPITAL_COMMUNITY): Payer: Self-pay | Admitting: Psychiatry

## 2018-02-22 ENCOUNTER — Ambulatory Visit (INDEPENDENT_AMBULATORY_CARE_PROVIDER_SITE_OTHER): Payer: BC Managed Care – PPO | Admitting: Psychiatry

## 2018-02-22 DIAGNOSIS — F332 Major depressive disorder, recurrent severe without psychotic features: Secondary | ICD-10-CM

## 2018-02-22 DIAGNOSIS — Z87891 Personal history of nicotine dependence: Secondary | ICD-10-CM | POA: Diagnosis not present

## 2018-02-22 DIAGNOSIS — F419 Anxiety disorder, unspecified: Secondary | ICD-10-CM | POA: Diagnosis not present

## 2018-02-22 MED ORDER — SERTRALINE HCL 50 MG PO TABS: 50.0000 mg | ORAL_TABLET | Freq: Every day | ORAL | 2 refills | Status: DC

## 2018-02-22 NOTE — Progress Notes (Signed)
BH MD/PA/NP OP Progress Note  02/22/2018 8:41 AM Erin Cunningham  MRN:  [id number]  Chief Complaint: I am doing fine.  I am taking my medication.  HPI: Erin Cunningham came for her follow-up appointment.  She is compliant with Zoloft and reported no side effects.  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  She feels good at work when she deals with students.  She continues to have on and off issues with her husband but she is pleased that husband is no longer drinking.  Patient denies any agitation, anger, mania, psychosis or any hallucination.  She admitted occasionally drink 1 beer every 2 weeks but denies any intoxication, blackouts, seizures or any withdrawal symptoms.  Patient denies any major panic attack.  She denies any crying spells or any feeling of hopelessness or worthlessness.  Her appetite is okay.  Her vital signs are stable.  She has no tremors, shakes or any EPS.  Visit Diagnosis:    ICD-10-CM   1. Major depressive disorder, recurrent severe without psychotic features ([place]) F33.2 sertraline (ZOLOFT) 50 MG tablet    Past Psychiatric History: Reviewed. Patient has history of depression and anxiety. She has seen this Probation officer. Years ago. She has history of one psychiatric hospitalization at [facility] few years ago due to severe depression and having suicidal thoughts. She was also briefly admitted at OBS unit in 2016. Patient denies any history of mania, psychosis, hallucination or any suicidal attempt.  Past Medical History:  Past Medical History:  Diagnosis Date  . Breast CA ([place]) 2006   right [facility])  . Cancer ([place])   . Helicobacter pylori gastritis 2001  . High blood pressure   . History of adenomatous polyp of colon 09/23/2017   Oma 09/25/2017 and apparently polyps in the past as well though recalled age  . History of shingles   . Major depressive disorder, recurrent, severe without psychotic features ([place]) 06/24/2015  . Parotid mass 2014   right  benign cyst  . Personal history of radiation therapy 2006   Rt breast  . Wears glasses     Past Surgical History:  Procedure Laterality Date  . ABDOMINAL HYSTERECTOMY  1982   BSO  . BREAST LUMPECTOMY WITH SENTINEL LYMPH NODE BIOPSY  2005   right  . BREAST SURGERY  2004   rt br mass  . CESAREAN SECTION    . COLONOSCOPY    . ESOPHAGOGASTRODUODENOSCOPY  2001   H pylori gastritis  . EYE SURGERY     both cataracts  . PAROTIDECTOMY Right 09/05/2013   Procedure: RIGHT SUPERFICIAL PAROTIDECTOMY WITH FACIAL NERVE DISECTION;  Surgeon: [name], MD;  Location: [facility];  Service: ENT;  Laterality: Right;  All documentation done by R.Ward after [phone] --done under G. Garzon's password  . RE-EXCISION OF BREAST LUMPECTOMY  2005   right    Family Psychiatric History: Reviewed.  Family History:  Family History  Problem Relation Age of Onset  . Heart disease Mother        MI  . Cancer Father        bone  . Thyroid disease Neg Hx   . Hypercalcemia Neg Hx   . Colon cancer Neg Hx   . Stomach cancer Neg Hx   . Rectal cancer Neg Hx   . Esophageal cancer Neg Hx     Social History:  Social History   Socioeconomic History  . Marital status: Married  Spouse name: Not on file  . Number of children: 3  . Years of education: At least some college  . Highest education level: Not on file  Social Needs  . Financial resource strain: Not on file  . Food insecurity - worry: Not on file  . Food insecurity - inability: Not on file  . Transportation needs - medical: Not on file  . Transportation needs - non-medical: Not on file  Occupational History  . Occupation: Middle school Scientist, research (physical sciences): [employer]  Tobacco Use  . Smoking status: Never Smoker  . Smokeless tobacco: Former Systems developer    Types: Chew  Substance and Sexual Activity  . Alcohol use: Yes    Alcohol/week: 0.0 oz    Comment: occ  . Drug use: No  . Sexual activity: Not  Currently  Other Topics Concern  . 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 [place]   1 coffee one soft drink daily   08/02/2017    Allergies: No Known Allergies  Metabolic Disorder Labs: No results found for: HGBA1C, MPG No results found for: PROLACTIN Lab Results  Component Value Date   CHOL 196 05/12/2017   TRIG 80 05/12/2017   HDL 54 05/12/2017   CHOLHDL 3.6 05/12/2017   VLDL 16 05/12/2017   LDLCALC 126 (H) 05/12/2017   LDLCALC 135 (H) 12/02/2016   Lab Results  Component Value Date   TSH 1.01 05/12/2017   TSH 0.72 04/20/2016    Therapeutic Level Labs: No results found for: LITHIUM No results found for: VALPROATE No components found for:  CBMZ  Current Medications: Current Outpatient Medications  Medication Sig Dispense Refill  . calcium-vitamin D (OSCAL WITH D) 500-200 MG-UNIT tablet Take 1 tablet by mouth daily.    Marland Kitchen lisinopril (PRINIVIL,ZESTRIL) 40 MG tablet Take 1 tablet (40 mg total) by mouth daily. Need to schedule an appointment. 90 tablet 3  . sertraline (ZOLOFT) 50 MG tablet Take 1 tablet (50 mg total) by mouth daily. 30 tablet 2   No current facility-administered medications for this visit.      Musculoskeletal: Strength & Muscle Tone: within normal limits Gait & Station: normal Patient leans: N/A  Psychiatric Specialty Exam: ROS  Blood pressure (!) 172/94, pulse 60, height 5\' 3"  (1.6 m), weight 166 lb (75.3 kg), SpO2 98 %.There is no height or weight on file to calculate BMI.  General Appearance: Casual  Eye Contact:  Good  Speech:  Clear and Coherent  Volume:  Normal  Mood:  Euthymic  Affect:  Appropriate  Thought Process:  Goal Directed  Orientation:  Full (Time, Place, and Person)  Thought Content: Logical   Suicidal Thoughts:   No  Homicidal Thoughts:  No  Memory:  Immediate;   Good Recent;   Good Remote;   Good  Judgement:  Good  Insight:  Good  Psychomotor Activity:  Normal  Concentration:  Concentration: Good and Attention Span: Good  Recall:  Good  Fund of Knowledge: Good  Language: Good  Akathisia:  No  Handed:  Right  AIMS (if indicated): not done  Assets:  Communication Skills Desire for Improvement Housing Physical Health Resilience  ADL's:  Intact  Cognition: WNL  Sleep:  Good   Screenings: AIMS  Admission (Discharged) from 06/24/2015 in [facility]  AIMS Total Score  0    AUDIT     Admission (Discharged) from 06/24/2015 in [facility]  Alcohol Use Disorder Identification Test Final Score (AUDIT)  1    Mini-Mental     Office Visit from confidential encounter on 02/15/2015  Total Score (max 30 points )  29    PHQ2-9     Office Visit from confidential encounter on 12/02/2016 Office Visit from confidential encounter on 02/15/2015 Office Visit from confidential encounter on 01/16/2015  PHQ-2 Total Score  2  0  2  PHQ-9 Total Score  No data  No data  2       Assessment and Plan: Major depressive disorder, recurrent.  Anxiety disorder NOS.  Patient is a stable on her current psychiatric medication.  Continue Zoloft 50 mg daily.  Patient is not interested in counseling or any therapy.  Recommended to call us back if she has any question or any concern.  We will do blood work on her next appointment.  Encourage healthy lifestyle and watch her calorie intake.  Follow-up in 3 months.   [name], MD 02/22/2018, 8:41 AM

## 2018-02-28 ENCOUNTER — Encounter: Payer: Self-pay | Admitting: Nurse Practitioner

## 2018-02-28 ENCOUNTER — Ambulatory Visit: Payer: BC Managed Care – PPO | Admitting: Nurse Practitioner

## 2018-02-28 VITALS — BP 146/88 | HR 74 | Temp 99.4°F | Ht 63.0 in | Wt 167.0 lb

## 2018-02-28 DIAGNOSIS — J069 Acute upper respiratory infection, unspecified: Secondary | ICD-10-CM | POA: Diagnosis not present

## 2018-02-28 LAB — POCT INFLUENZA A/B
Influenza A, POC: NEGATIVE
Influenza B, POC: NEGATIVE

## 2018-02-28 MED ORDER — ALBUTEROL SULFATE (2.5 MG/3ML) 0.083% IN NEBU
2.5000 mg | INHALATION_SOLUTION | Freq: Once | RESPIRATORY_TRACT | Status: AC
  Administered 2018-02-28: 2.5 mg via RESPIRATORY_TRACT

## 2018-02-28 MED ORDER — AZITHROMYCIN 250 MG PO TABS: ORAL_TABLET | ORAL | 0 refills | Status: DC

## 2018-02-28 MED ORDER — ALBUTEROL SULFATE HFA 108 (90 BASE) MCG/ACT IN AERS: 2.0000 | INHALATION_SPRAY | Freq: Four times a day (QID) | RESPIRATORY_TRACT | 0 refills | Status: DC | PRN

## 2018-02-28 NOTE — Patient Instructions (Addendum)
To go to get chest xray To take azithromycin as prescribed   neti pot twice daily Plain nasal saline spray throughout the day as needed May use tylenol 325 mg 2 tablets every 6 hours as needed aches and pains or sore throat, fever humidifier in the home to help with the dry air Mucinex DM by mouth twice daily for 7 days then as needed for cough and congestion with full glass of water  Keep well hydrated Avoid forcefully blowing nose To use albuterol 2 puffs every 6 hours for wheezing/shortness of breath/severe coughing    Upper Respiratory Infection, Adult Most upper respiratory infections (URIs) are a viral infection of the air passages leading to the lungs. A URI affects the nose, throat, and upper air passages. The most common type of URI is nasopharyngitis and is typically referred to as "the common cold." URIs run their course and usually go away on their own. Most of the time, a URI does not require medical attention, but sometimes a bacterial infection in the upper airways can follow a viral infection. This is called a secondary infection. Sinus and middle ear infections are common types of secondary upper respiratory infections. Bacterial pneumonia can also complicate a URI. A URI can worsen asthma and chronic obstructive pulmonary disease (COPD). Sometimes, these complications can require emergency medical care and may be life threatening. What are the causes? Almost all URIs are caused by viruses. A virus is a type of germ and can spread from one person to another. What increases the risk? You may be at risk for a URI if:  You smoke.  You have chronic heart or lung disease.  You have a weakened defense (immune) system.  You are very young or very old.  You have nasal allergies or asthma.  You work in crowded or poorly ventilated areas.  You work in health care facilities or schools.  What are the signs or symptoms? Symptoms typically develop 2-3 days after you come in  contact with a cold virus. Most viral URIs last 7-10 days. However, viral URIs from the influenza virus (flu virus) can last 14-18 days and are typically more severe. Symptoms may include:  Runny or stuffy (congested) nose.  Sneezing.  Cough.  Sore throat.  Headache.  Fatigue.  Fever.  Loss of appetite.  Pain in your forehead, behind your eyes, and over your cheekbones (sinus pain).  Muscle aches.  How is this diagnosed? Your health care provider may diagnose a URI by:  Physical exam.  Tests to check that your symptoms are not due to another condition such as: ? Strep throat. ? Sinusitis. ? Pneumonia. ? Asthma.  How is this treated? A URI goes away on its own with time. It cannot be cured with medicines, but medicines may be prescribed or recommended to relieve symptoms. Medicines may help:  Reduce your fever.  Reduce your cough.  Relieve nasal congestion.  Follow these instructions at home:  Take medicines only as directed by your health care provider.  Gargle warm saltwater or take cough drops to comfort your throat as directed by your health care provider.  Use a warm mist humidifier or inhale steam from a shower to increase air moisture. This may make it easier to breathe.  Drink enough fluid to keep your urine clear or pale yellow.  Eat soups and other clear broths and maintain good nutrition.  Rest as needed.  Return to work when your temperature has returned to normal or as your  health care provider advises. You may need to stay home longer to avoid infecting others. You can also use a face mask and careful hand washing to prevent spread of the virus.  Increase the usage of your inhaler if you have asthma.  Do not use any tobacco products, including cigarettes, chewing tobacco, or electronic cigarettes. If you need help quitting, ask your health care provider. How is this prevented? The best way to protect yourself from getting a cold is to  practice good hygiene.  Avoid oral or hand contact with people with cold symptoms.  Wash your hands often if contact occurs.  There is no clear evidence that vitamin C, vitamin E, echinacea, or exercise reduces the chance of developing a cold. However, it is always recommended to get plenty of rest, exercise, and practice good nutrition. Contact a health care provider if:  You are getting worse rather than better.  Your symptoms are not controlled by medicine.  You have chills.  You have worsening shortness of breath.  You have brown or red mucus.  You have yellow or brown nasal discharge.  You have pain in your face, especially when you bend forward.  You have a fever.  You have swollen neck glands.  You have pain while swallowing.  You have white areas in the back of your throat. Get help right away if:  You have severe or persistent: ? Headache. ? Ear pain. ? Sinus pain. ? Chest pain.  You have chronic lung disease and any of the following: ? Wheezing. ? Prolonged cough. ? Coughing up blood. ? A change in your usual mucus.  You have a stiff neck.  You have changes in your: ? Vision. ? Hearing. ? Thinking. ? Mood. This information is not intended to replace advice given to you by your health care provider. Make sure you discuss any questions you have with your health care provider. Document Released: 05/19/2001 Document Revised: 07/26/2016 Document Reviewed: 02/28/2014 Elsevier Interactive Patient Education  [employer].

## 2018-02-28 NOTE — Progress Notes (Signed)
Careteam: Patient Care Team: Gildardo Cranker, DO as PCP - General (Internal Medicine) [name], MD as Consulting Physician (Otolaryngology)  Advanced Directive information    No Known Allergies  Chief Complaint  Patient presents with  . Acute Visit    Pt is being seen due to cough with yellow sputum, rattling in chest, low grade fever, and SOB for 4 days.      HPI: Patient is a 77 y.o. female seen in the office today due to not feeling well. Started 3 days ago with sore throat then progressed to chest congestion and headache.  Body is very weak. Using robitussin twice daily and Nyquil and ginger tea and orange juice and chicken noodle soup.  Pt reports hx of asthma Today sputum is thick and yellow, previously been clear. Having cold and hot chills.  Drinking a lot of fluid.  Increase shortness of breath with activity  Non smoker but uses chewing tobacco.   Review of Systems:  Review of Systems  Constitutional: Positive for chills, fever and malaise/fatigue.  HENT: Positive for congestion and ear pain (left ). Negative for sinus pain and sore throat.   Respiratory: Positive for cough, sputum production, shortness of breath and wheezing.   Cardiovascular: Negative for chest pain and palpitations.  Neurological: Positive for weakness. Negative for headaches.    Past Medical History:  Diagnosis Date  . Breast CA ([place]) 2006   right [facility])  . Cancer ([place])   . Helicobacter pylori gastritis 2001  . High blood pressure   . History of adenomatous polyp of colon 09/23/2017   Oma 09/25/2017 and apparently polyps in the past as well though recalled age  . History of shingles   . Major depressive disorder, recurrent, severe without psychotic features ([place]) 06/24/2015  . Parotid mass 2014   right benign cyst  . Personal history of radiation therapy 2006   Rt breast  . Wears glasses    Past Surgical History:  Procedure Laterality Date  . ABDOMINAL HYSTERECTOMY   1982   BSO  . BREAST LUMPECTOMY WITH SENTINEL LYMPH NODE BIOPSY  2005   right  . BREAST SURGERY  2004   rt br mass  . CESAREAN SECTION    . COLONOSCOPY    . ESOPHAGOGASTRODUODENOSCOPY  2001   H pylori gastritis  . EYE SURGERY     both cataracts  . PAROTIDECTOMY Right 09/05/2013   Procedure: RIGHT SUPERFICIAL PAROTIDECTOMY WITH FACIAL NERVE DISECTION;  Surgeon: [name], MD;  Location: [facility];  Service: ENT;  Laterality: Right;  All documentation done by R.Ward after [phone] --done under G. Garzon's password  . RE-EXCISION OF BREAST LUMPECTOMY  2005   right   Social History:   reports that she has never smoked. She quit smokeless tobacco use about 6 months ago. Her smokeless tobacco use included chew. She reports that she drinks alcohol. She reports that she does not use drugs.  Family History  Problem Relation Age of Onset  . Heart disease Mother        MI  . Cancer Father        bone  . Thyroid disease Neg Hx   . Hypercalcemia Neg Hx   . Colon cancer Neg Hx   . Stomach cancer Neg Hx   . Rectal cancer Neg Hx   . Esophageal cancer Neg Hx     Medications: Patient's Medications  New Prescriptions   No medications on file  Previous Medications  CALCIUM-VITAMIN D (OSCAL WITH D) 500-200 MG-UNIT TABLET    Take 1 tablet by mouth daily.   LISINOPRIL (PRINIVIL,ZESTRIL) 40 MG TABLET    Take 1 tablet (40 mg total) by mouth daily. Need to schedule an appointment.   SERTRALINE (ZOLOFT) 50 MG TABLET    Take 1 tablet (50 mg total) by mouth daily.  Modified Medications   No medications on file  Discontinued Medications   No medications on file     Physical Exam:  Vitals:   02/28/18 1537  BP: (!) 146/88  Pulse: 74  Temp: 99.4 F (37.4 C)  TempSrc: Oral  SpO2: 99%  Weight: 167 lb (75.8 kg)  Height: 5\' 3"  (1.6 m)   Body mass index is 29.58 kg/m.  Physical Exam  Constitutional: She appears well-developed and well-nourished. She appears ill.    HENT:  Head: Normocephalic and atraumatic.  Right Ear: External ear normal.  Left Ear: External ear normal.  Nose: Mucosal edema and rhinorrhea present.  Mouth/Throat: Oropharynx is clear and moist.  Eyes: Pupils are equal, round, and reactive to light. Conjunctivae and EOM are normal.  Neck: Normal range of motion. Neck supple. No thyromegaly present.  Cardiovascular: Normal rate and regular rhythm.  Pulmonary/Chest: Stridor: throughout. Tachypnea (slightly elevated RR) noted. No respiratory distress. She has decreased breath sounds. She has rhonchi (scattered bilaterally).  Neurological: She is alert.    Labs reviewed: Basic Metabolic Panel: Recent Labs    05/12/17 1042  NA 140  K 4.7  CL 107  CO2 24  GLUCOSE 81  BUN 13  CREATININE 0.80  CALCIUM 9.9  TSH 1.01   Liver Function Tests: Recent Labs    05/12/17 1042  AST 16  ALT 7  ALKPHOS 94  BILITOT 0.6  PROT 6.7  ALBUMIN 3.8   No results for input(s): LIPASE, AMYLASE in the last 8760 hours. No results for input(s): AMMONIA in the last 8760 hours. CBC: No results for input(s): WBC, NEUTROABS, HGB, HCT, MCV, PLT in the last 8760 hours. Lipid Panel: Recent Labs    05/12/17 1042  CHOL 196  HDL 54  LDLCALC 126*  TRIG 80  CHOLHDL 3.6   TSH: Recent Labs    05/12/17 1042  TSH 1.01   A1C: No results found for: HGBA1C   Assessment/Plan 1. Upper respiratory tract infection, unspecified type - POC Influenza A/B- negative  - DG Chest 2 View- rule out pneumonia, may need additional antibiotic coverage - CBC with Differential/Platelets - BASIC METABOLIC PANEL WITH GFR - azithromycin (ZITHROMAX) 250 MG tablet; 2 tablets today then one tablet daily until complete  Dispense: 6 tablet; Refill: 0 - albuterol (PROVENTIL) (2.5 MG/3ML) 0.083% nebulizer solution 2.5 mg given in office, with symptomatic improvement in shortness of breath and cough.  - albuterol (VENTOLIN HFA) 108 (90 Base) MCG/ACT inhaler; Inhale 2  puffs into the lungs every 6 (six) hours as needed for wheezing or shortness of breath.  Dispense: 6.7 g; Refill: 0  -to increase hydration -mucinex DM BID for 7 days -saline nasally -to go to the ED if symptoms worsen  Marcellius Montagna K. [place], [facility] [phone]

## 2018-03-01 ENCOUNTER — Ambulatory Visit
Admission: RE | Admit: 2018-03-01 | Discharge: 2018-03-01 | Disposition: A | Discharge status: Home / Self Care | Payer: BC Managed Care – PPO | Source: Ambulatory Visit | Attending: Nurse Practitioner | Admitting: Nurse Practitioner

## 2018-03-01 LAB — CBC WITH DIFFERENTIAL/PLATELET
BASOS ABS: 164 {cells}/uL (ref 0–200)
Basophils Relative: 2.1 %
EOS ABS: 374 {cells}/uL (ref 15–500)
Eosinophils Relative: 4.8 %
HCT: 41.3 % (ref 35.0–45.0)
Hemoglobin: 13.7 g/dL (ref 11.7–15.5)
LYMPHS ABS: 1373 {cells}/uL (ref 850–3900)
MCH: 28.2 pg (ref 27.0–33.0)
MCHC: 33.2 g/dL (ref 32.0–36.0)
MCV: 85.2 fL (ref 80.0–100.0)
MPV: 12.1 fL (ref 7.5–12.5)
Monocytes Relative: 14 %
NEUTROS PCT: 61.5 %
Neutro Abs: 4797 cells/uL (ref 1500–7800)
Platelets: 287 10*3/uL (ref 140–400)
RBC: 4.85 10*6/uL (ref 3.80–5.10)
RDW: 12.9 % (ref 11.0–15.0)
Total Lymphocyte: 17.6 %
WBC: 7.8 10*3/uL (ref 3.8–10.8)
WBCMIX: 1092 {cells}/uL — AB (ref 200–950)

## 2018-03-01 LAB — BASIC METABOLIC PANEL WITH GFR
BUN: 10 mg/dL (ref 7–25)
CALCIUM: 10.5 mg/dL — AB (ref 8.6–10.4)
CHLORIDE: 103 mmol/L (ref 98–110)
CO2: 27 mmol/L (ref 20–32)
Creat: 0.81 mg/dL (ref 0.60–0.93)
GFR, EST AFRICAN AMERICAN: 82 mL/min/{1.73_m2} (ref >=60)
GFR, EST NON AFRICAN AMERICAN: 71 mL/min/{1.73_m2} (ref >=60)
Glucose, Bld: 93 mg/dL (ref 65–139)
POTASSIUM: 4.3 mmol/L (ref 3.5–5.3)
Sodium: 138 mmol/L (ref 135–146)

## 2018-03-02 ENCOUNTER — Encounter: Payer: Self-pay | Admitting: Nurse Practitioner

## 2018-03-02 ENCOUNTER — Ambulatory Visit (INDEPENDENT_AMBULATORY_CARE_PROVIDER_SITE_OTHER): Payer: BC Managed Care – PPO | Admitting: Nurse Practitioner

## 2018-03-02 ENCOUNTER — Telehealth: Payer: Self-pay | Admitting: *Deleted

## 2018-03-02 ENCOUNTER — Other Ambulatory Visit: Payer: Self-pay | Admitting: Nurse Practitioner

## 2018-03-02 VITALS — BP 136/84 | HR 79 | Temp 98.4°F | Ht 63.0 in | Wt 167.0 lb

## 2018-03-02 DIAGNOSIS — J069 Acute upper respiratory infection, unspecified: Secondary | ICD-10-CM | POA: Diagnosis not present

## 2018-03-02 DIAGNOSIS — R0602 Shortness of breath: Secondary | ICD-10-CM

## 2018-03-02 DIAGNOSIS — R9389 Abnormal findings on diagnostic imaging of other specified body structures: Secondary | ICD-10-CM

## 2018-03-02 NOTE — Progress Notes (Signed)
Careteam: Patient Care Team: Gildardo Cranker, DO as PCP - General (Internal Medicine) [name], MD as Consulting Physician (Otolaryngology)  Advanced Directive information    No Known Allergies  Chief Complaint  Patient presents with  . Follow-up    Pt is being seen due to worsening congestion.      HPI: Patient is a 77 y.o. female seen in the office today due to ongoing symptoms.   pt was seen on 02/28/18 due to URI. Given Rx for azithromycin, albuterol inhaler PRN and instructed to use mucinex DM BID, saline nasally PRN.  Pt admits she did not understand how to use albuterol inhaler and did not ask at pharmacy. Directions given in office today. Overall doing better.  Also did not continue mucinex DM- took sample but that was it   Review of Systems:  Review of Systems  Constitutional: Positive for malaise/fatigue. Negative for chills and fever.  HENT: Positive for congestion. Negative for ear pain, sinus pain and sore throat.   Respiratory: Positive for cough, sputum production, shortness of breath and wheezing.   Cardiovascular: Negative for chest pain and palpitations.  Neurological: Negative for weakness and headaches.    Past Medical History:  Diagnosis Date  . Breast CA ([place]) 2006   right [facility])  . Cancer ([place])   . Helicobacter pylori gastritis 2001  . High blood pressure   . History of adenomatous polyp of colon 09/23/2017   Oma 09/25/2017 and apparently polyps in the past as well though recalled age  . History of shingles   . Major depressive disorder, recurrent, severe without psychotic features ([place]) 06/24/2015  . Parotid mass 2014   right benign cyst  . Personal history of radiation therapy 2006   Rt breast  . Wears glasses    Past Surgical History:  Procedure Laterality Date  . ABDOMINAL HYSTERECTOMY  1982   BSO  . BREAST LUMPECTOMY WITH SENTINEL LYMPH NODE BIOPSY  2005   right  . BREAST SURGERY  2004   rt br mass  . CESAREAN  SECTION    . COLONOSCOPY    . ESOPHAGOGASTRODUODENOSCOPY  2001   H pylori gastritis  . EYE SURGERY     both cataracts  . PAROTIDECTOMY Right 09/05/2013   Procedure: RIGHT SUPERFICIAL PAROTIDECTOMY WITH FACIAL NERVE DISECTION;  Surgeon: [name], MD;  Location: [facility];  Service: ENT;  Laterality: Right;  All documentation done by R.Ward after [phone] --done under G. Garzon's password  . RE-EXCISION OF BREAST LUMPECTOMY  2005   right   Social History:   reports that she has never smoked. She quit smokeless tobacco use about 6 months ago. Her smokeless tobacco use included chew. She reports that she drinks alcohol. She reports that she does not use drugs.  Family History  Problem Relation Age of Onset  . Heart disease Mother        MI  . Cancer Father        bone  . Thyroid disease Neg Hx   . Hypercalcemia Neg Hx   . Colon cancer Neg Hx   . Stomach cancer Neg Hx   . Rectal cancer Neg Hx   . Esophageal cancer Neg Hx     Medications: Patient's Medications  New Prescriptions   No medications on file  Previous Medications   ALBUTEROL (VENTOLIN HFA) 108 (90 BASE) MCG/ACT INHALER    Inhale 2 puffs into the lungs every 6 (six) hours as needed for  wheezing or shortness of breath.   AZITHROMYCIN (ZITHROMAX) 250 MG TABLET    2 tablets today then one tablet daily until complete   CALCIUM-VITAMIN D (OSCAL WITH D) 500-200 MG-UNIT TABLET    Take 1 tablet by mouth daily.   LISINOPRIL (PRINIVIL,ZESTRIL) 40 MG TABLET    Take 1 tablet (40 mg total) by mouth daily. Need to schedule an appointment.   SERTRALINE (ZOLOFT) 50 MG TABLET    Take 1 tablet (50 mg total) by mouth daily.  Modified Medications   No medications on file  Discontinued Medications   No medications on file     Physical Exam:  Vitals:   03/02/18 1533  BP: 136/84  Pulse: 79  Temp: 98.4 F (36.9 C)  TempSrc: Oral  SpO2: 99%  Weight: 167 lb (75.8 kg)  Height: 5\' 3"  (1.6 m)   Body mass index  is 29.58 kg/m.  Physical Exam  Constitutional: She appears well-developed and well-nourished.  HENT:  Head: Normocephalic and atraumatic.  Right Ear: External ear normal.  Left Ear: External ear normal.  Nose: Mucosal edema and rhinorrhea present.  Mouth/Throat: Oropharynx is clear and moist.  Eyes: Pupils are equal, round, and reactive to light. Conjunctivae and EOM are normal.  Neck: Normal range of motion. Neck supple. No thyromegaly present.  Cardiovascular: Normal rate, regular rhythm and normal heart sounds.  Pulmonary/Chest: Effort normal. No tachypnea. No respiratory distress. She has decreased breath sounds (bilaterally in bases).  Neurological: She is alert.     Labs reviewed: Basic Metabolic Panel: Recent Labs    05/12/17 1042 02/28/18 1633  NA 140 138  K 4.7 4.3  CL 107 103  CO2 24 27  GLUCOSE 81 93  BUN 13 10  CREATININE 0.80 0.81  CALCIUM 9.9 10.5*  TSH 1.01  --    Liver Function Tests: Recent Labs    05/12/17 1042  AST 16  ALT 7  ALKPHOS 94  BILITOT 0.6  PROT 6.7  ALBUMIN 3.8   No results for input(s): LIPASE, AMYLASE in the last 8760 hours. No results for input(s): AMMONIA in the last 8760 hours. CBC: Recent Labs    02/28/18 1633  WBC 7.8  NEUTROABS 4,797  HGB 13.7  HCT 41.3  MCV 85.2  PLT 287   Lipid Panel: Recent Labs    05/12/17 1042  CHOL 196  HDL 54  LDLCALC 126*  TRIG 80  CHOLHDL 3.6   TSH: Recent Labs    05/12/17 1042  TSH 1.01   A1C: No results found for: HGBA1C   Assessment/Plan 1. Upper respiratory tract infection, unspecified type Overall improved, cont azithromycin. To use albuterol as needed shortness of breath, wheezing. To start mucinex DM by mouth twice daly with full glass of water for 1 week.   Carlos American. [place], [facility] [phone]

## 2018-03-02 NOTE — Telephone Encounter (Signed)
Can she come in to be evaluated, last OV was not wheezing however if this has worsened may need additional medication

## 2018-03-02 NOTE — Telephone Encounter (Signed)
Patient would like to know if you can order her a nebulizer treatment for home? Patient states that she is very short of breath and the albuterol inhaler is not working.   We do have nebulizer's in office that can be sent home with patient but medication would have to be ordered.

## 2018-03-02 NOTE — Patient Instructions (Signed)
Continue to stay hydrated To take [place] DM By mouth twice daily at least 7 days then as needed  To use albuterol AS needed shortness of breath/wheezing/increase cough

## 2018-03-02 NOTE — Telephone Encounter (Signed)
Patient called and stated that she is no better. Stated that she has wheezing in her chest with SOB and just doesn't feel good. Low grade fever. Stated that she is resting, drinking water and taking her medication. Stated this is getting her down and she feels worse now than she did on Monday. Please Advise.    (Advised patient that if any new symptoms or symptoms worsen to go to ER. Patient agreed)

## 2018-03-02 NOTE — Telephone Encounter (Signed)
Patient scheduled an appointment for this afternoon.

## 2018-03-11 ENCOUNTER — Ambulatory Visit
Admission: RE | Admit: 2018-03-11 | Discharge: 2018-03-11 | Disposition: A | Discharge status: Home / Self Care | Payer: BC Managed Care – PPO | Source: Ambulatory Visit | Attending: Nurse Practitioner | Admitting: Nurse Practitioner

## 2018-03-11 ENCOUNTER — Other Ambulatory Visit: Payer: Self-pay | Admitting: Nurse Practitioner

## 2018-03-11 DIAGNOSIS — R9389 Abnormal findings on diagnostic imaging of other specified body structures: Secondary | ICD-10-CM

## 2018-03-11 MED ORDER — IOPAMIDOL (ISOVUE-300) INJECTION 61%
75.0000 mL | Freq: Once | INTRAVENOUS | Status: AC | PRN
  Administered 2018-03-11: 75 mL via INTRAVENOUS

## 2018-04-04 ENCOUNTER — Telehealth: Payer: Self-pay | Admitting: *Deleted

## 2018-04-04 NOTE — Telephone Encounter (Signed)
Is she having any other symptoms other than cough? mucinex help with congestion but not cough, she can take delsym 5 ml every 12 hours as needed to help with cough, can also use cough drops and to make sure to stay well hydrated.

## 2018-04-04 NOTE — Telephone Encounter (Signed)
Patient called and stated that you saw her on 03/02/18 for URI. Patient complains of still having a cough that keeps her up at night. Productive with Clear Thick mucus. Taking Mucinex and Robitussin with no relief. No fever. Please Advise.

## 2018-04-04 NOTE — Telephone Encounter (Signed)
No other symptoms. Patient will try the Delsym and Hydrate.

## 2018-04-12 ENCOUNTER — Encounter: Payer: Self-pay | Admitting: Internal Medicine

## 2018-04-12 ENCOUNTER — Ambulatory Visit (INDEPENDENT_AMBULATORY_CARE_PROVIDER_SITE_OTHER): Payer: BC Managed Care – PPO | Admitting: Internal Medicine

## 2018-04-12 VITALS — BP 148/92 | HR 83 | Temp 98.3°F | Resp 10 | Ht 63.0 in | Wt 171.0 lb

## 2018-04-12 DIAGNOSIS — J309 Allergic rhinitis, unspecified: Secondary | ICD-10-CM | POA: Diagnosis not present

## 2018-04-12 DIAGNOSIS — F332 Major depressive disorder, recurrent severe without psychotic features: Secondary | ICD-10-CM | POA: Diagnosis not present

## 2018-04-12 DIAGNOSIS — E785 Hyperlipidemia, unspecified: Secondary | ICD-10-CM

## 2018-04-12 DIAGNOSIS — I10 Essential (primary) hypertension: Secondary | ICD-10-CM | POA: Diagnosis not present

## 2018-04-12 NOTE — Patient Instructions (Addendum)
Continue current medications as ordered  Follow up in Summer 2019 for CPE/ECG. Fasting labs prior to appt

## 2018-04-12 NOTE — Progress Notes (Signed)
Patient ID: Erin Cunningham, female   DOB: January 20, 1941, 77 y.o.   MRN: [id number]   Location:  [facility] OFFICE  Provider: DR Arletha Grippe  Code Status:  Goals of Care:  Advanced Directives 09/15/2017  Does Patient Have a Medical Advance Directive? No  Would patient like information on creating a medical advance directive? -  Some encounter information is confidential and restricted. Go to Review Flowsheets activity to see all data.     Chief Complaint  Patient presents with  . Medical Management of Chronic Issues    3 month follow-up on HTN. Patient c/o coughing spells(productive/clear) that bring on headaches. Inhaler did not help   . Medication Refill    No refills needed   . CT Results    Patient would like to discuss recommendation to check CT in 3 months (July 2019)     HPI: Patient is a 77 y.o. female seen today for medical management of chronic diseases.  She was treated for URI in March 2019. She continued to have a cough and was sent for further imaging. CT chest neg for mass/adenopathy. She still has intermittent cough but is improved. Min sputum pdtn but no purulent sputum. No f/c. No hemoptysis.   Chronic tobacco use (chews) - She has a persistent cough that is worse at night  >1 yr. Cough interrupts sleep. She does notice that when she chews tobacco at night the cough begans and does not stop even after spitting out tobacco. She does not use tobacco during the day time. She gets light headed at times. Occasional dysphagia with solids. She feels a lump in anterior neck. She is c/a cancer. She has been chewing tobacco since age 72. She underwent neck bx in Aug 2017 by ENT Dr Lucia Gaskins and results revealed no cancer cells  Vaginal dryness - ongoing. she was unable to afford estrogen cream   HTN - BP stable on lisinopril. She no longer takes HCTZ. No HA or dizziness. No CP or SOB. She does admit that she did not take her medication consistently. She was seen in the ED on 02/07/17 for  HTN. BP at that time 253/84. She was tx with IV BP med and at d/c 171/84  Depression - stable on zoloft. She is followed by psych Dr Dossie Der. No SI/HI   Past Medical History:  Diagnosis Date  . Breast CA ([place]) 2006   right [facility])  . Cancer ([place])   . Helicobacter pylori gastritis 2001  . High blood pressure   . History of adenomatous polyp of colon 09/23/2017   Oma 09/25/2017 and apparently polyps in the past as well though recalled age  . History of shingles   . Major depressive disorder, recurrent, severe without psychotic features ([place]) 06/24/2015  . Parotid mass 2014   right benign cyst  . Personal history of radiation therapy 2006   Rt breast  . Wears glasses     Past Surgical History:  Procedure Laterality Date  . ABDOMINAL HYSTERECTOMY  1982   BSO  . BREAST LUMPECTOMY WITH SENTINEL LYMPH NODE BIOPSY  2005   right  . BREAST SURGERY  2004   rt br mass  . CESAREAN SECTION    . COLONOSCOPY    . ESOPHAGOGASTRODUODENOSCOPY  2001   H pylori gastritis  . EYE SURGERY     both cataracts  . PAROTIDECTOMY Right 09/05/2013   Procedure: RIGHT SUPERFICIAL PAROTIDECTOMY WITH FACIAL NERVE DISECTION;  Surgeon: [name], MD;  Location: [facility];  Service: ENT;  Laterality: Right;  All documentation done by R.Ward after [phone] --done under G. Garzon's password  . RE-EXCISION OF BREAST LUMPECTOMY  2005   right     reports that she has never smoked. She quit smokeless tobacco use about 7 months ago. Her smokeless tobacco use included chew. She reports that she drinks alcohol. She reports that she does not use drugs. Social History   Socioeconomic History  . Marital status: Married    Spouse name: Not on file  . Number of children: 3  . Years of education: At least some college  . Highest education level: Not on file  Occupational History  . Occupation: Middle school Scientist, research (physical sciences): [employer]  . Financial  resource strain: Not on file  . Food insecurity:    Worry: Not on file    Inability: Not on file  . Transportation needs:    Medical: Not on file    Non-medical: Not on file  Tobacco Use  . Smoking status: Never Smoker  . Smokeless tobacco: Former Systems developer    Types: Chew  Substance and Sexual Activity  . Alcohol use: Yes    Alcohol/week: 0.0 oz    Comment: occ  . Drug use: No  . Sexual activity: Not Currently  Lifestyle  . Physical activity:    Days per week: Not on file    Minutes per session: Not on file  . Stress: Not on file  Relationships  . Social connections:    Talks on phone: Not on file    Gets together: Not on file    Attends religious service: Not on file    Active member of club or organization: Not on file    Attends meetings of clubs or organizations: Not on file    Relationship status: Not on file  . Intimate partner violence:    Fear of current or ex partner: Not on file    Emotionally abused: Not on file    Physically abused: Not on file    Forced sexual activity: Not on file  Other Topics Concern  . 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 Heart disease Mother        MI  . Cancer Father        bone  . Thyroid disease Neg Hx   . Hypercalcemia Neg Hx   . Colon cancer Neg Hx   . Stomach cancer Neg Hx   . Rectal cancer Neg Hx   . Esophageal cancer Neg Hx     No Known Allergies  Outpatient Encounter Medications as of 04/12/2018  Medication Sig  . calcium-vitamin D (OSCAL WITH D) 500-200 MG-UNIT tablet Take 1 tablet by mouth daily.  Marland Kitchen lisinopril (PRINIVIL,ZESTRIL) 40 MG tablet Take 1 tablet (40 mg total) by mouth daily. Need to  schedule an appointment.  . sertraline (ZOLOFT) 50 MG tablet Take 1 tablet (50 mg total) by mouth daily.  Marland Kitchen albuterol (VENTOLIN HFA) 108 (90 Base) MCG/ACT inhaler Inhale 2 puffs into the lungs every 6 (six) hours as  needed for wheezing or shortness of breath. (Patient not taking: Reported on 04/12/2018)  . [DISCONTINUED] azithromycin (ZITHROMAX) 250 MG tablet 2 tablets today then one tablet daily until complete   No facility-administered encounter medications on file as of 04/12/2018.     Review of Systems:  Review of Systems  Respiratory: Positive for cough.   Psychiatric/Behavioral: Positive for dysphoric mood.  All other systems reviewed and are negative.   Health Maintenance  Topic Date Due  . TETANUS/TDAP  12/20/1960  . INFLUENZA VACCINE  07/07/2018  . DEXA SCAN  Completed    Physical Exam: Vitals:   04/12/18 1520  BP: (!) 148/92  Pulse: 83  Resp: 10  Temp: 98.3 F (36.8 C)  TempSrc: Oral  SpO2: 95%  Weight: 171 lb (77.6 kg)  Height: _0  (1.6 m)   Body mass index is 30.29 kg/m. Physical Exam  Constitutional: She is oriented to person, place, and time. She appears well-developed and well-nourished.  HENT:  Mouth/Throat: Oropharynx is clear and moist. No oropharyngeal exudate.  MMM; no oral thrush; no tongue lesions  Eyes: Pupils are equal, round, and reactive to light. No scleral icterus.  Neck: Neck supple. Carotid bruit is not present. No tracheal deviation present. No thyromegaly present.  Cardiovascular: Normal rate, regular rhythm, normal heart sounds and intact distal pulses. Exam reveals no gallop and no friction rub.  No murmur heard. No LE edema b/l. no calf TTP.   Pulmonary/Chest: Effort normal and breath sounds normal. No stridor. No respiratory distress. She has no wheezes. She has no rales.  Abdominal: Soft. Normal appearance and bowel sounds are normal. She exhibits no distension and no mass. There is no hepatomegaly. There is no tenderness. There is no  rigidity, no rebound and no guarding. No hernia.  Lymphadenopathy:    She has no cervical adenopathy.  Neurological: She is alert and oriented to person, place, and time. She has normal reflexes.  Skin: Skin is warm and dry. No rash noted.  Psychiatric: She has a normal mood and affect. Her behavior is normal. Judgment and thought content normal.    Labs reviewed: Basic Metabolic Panel: Recent Labs    05/12/17 1042 02/28/18 1633  NA 140 138  K 4.7 4.3  CL 107 103  CO2 24 27  GLUCOSE 81 93  BUN 13 10  CREATININE 0.80 0.81  CALCIUM 9.9 10.5*  TSH 1.01  --    Liver Function Tests: Recent Labs    05/12/17 1042  AST 16  ALT 7  ALKPHOS 94  BILITOT 0.6  PROT 6.7  ALBUMIN 3.8   No results for input(s): LIPASE, AMYLASE in the last 8760 hours. No results for input(s): AMMONIA in the last 8760 hours. CBC: Recent Labs    02/28/18 1633  WBC 7.8  NEUTROABS 4,797  HGB 13.7  HCT 41.3  MCV 85.2  PLT 287   Lipid Panel: Recent Labs    05/12/17 1042  CHOL 196  HDL 54  LDLCALC 126*  TRIG 80  CHOLHDL 3.6   No results found for: HGBA1C  Procedures since last visit: No results found.  Assessment/Plan   ICD-10-CM   1. Allergic rhinitis, unspecified seasonality, unspecified trigger J30.9   2. Essential hypertension, benign I10 CBC with Differential/Platelets    Urinalysis with Reflex Microscopic  3. Hyperlipidemia LDL goal <130 E78.5 Lipid Panel    TSH  4. Major depressive disorder, recurrent, severe without psychotic features ([place]) F33.2 CMP with eGFR([facility])    She has  a persistent cough - t/c stopping ACEI if it continues with tx of seasonal allergy. Will discuss at next ov  Continue current medications as ordered  Follow up in Summer 2019 for CPE/ECG. Fasting labs prior to appt   [place] S. Perlie Gold  [facility] [address] [place], [place] [address] [phone] Cell (Monday-Friday 8 AM - 5  PM) [phone] After 5 PM and follow prompts

## 2018-05-25 ENCOUNTER — Ambulatory Visit (INDEPENDENT_AMBULATORY_CARE_PROVIDER_SITE_OTHER): Payer: BC Managed Care – PPO | Admitting: Psychiatry

## 2018-05-25 ENCOUNTER — Encounter (HOSPITAL_COMMUNITY): Payer: Self-pay | Admitting: Psychiatry

## 2018-05-25 DIAGNOSIS — F332 Major depressive disorder, recurrent severe without psychotic features: Secondary | ICD-10-CM | POA: Diagnosis not present

## 2018-05-25 MED ORDER — SERTRALINE HCL 50 MG PO TABS: 50.0000 mg | ORAL_TABLET | Freq: Every day | ORAL | 2 refills | Status: DC

## 2018-05-25 NOTE — Progress Notes (Signed)
[facility] MD/PA/NP OP Progress Note  05/25/2018 8:55 AM Erin Cunningham Cunningham  MRN:  [id number]  Chief Complaint: I am doing much better.  I am excited my grandkids are coming from [place] in summer.  HPI: Erin Cunningham Cunningham came for her follow-up appointment.  She is taking Zoloft 50 mg every day.  She is feeling much better.  She denies any crying spells, irritability, hopelessness or any suicidal thoughts.  She continues to have chronic medical issues but she is pleased that her husband is no longer drinking.  She is also very pleased because her 2 grandkids 33 and 54 years old are coming from [place] to stay with her.  She also hoping in August to visit her hometown [place] to see her mother.  Patient denies any major panic attack.  She denies any paranoia, hallucination or any manic symptoms.  Her energy level is good.  She spent most of the time on her garden which is very relaxing for her.  Patient has no tremors, shakes or any EPS.  She denies drinking or using any illegal substances.  Visit Diagnosis:    ICD-10-CM   1. Major depressive disorder, recurrent severe without psychotic features ([place]) F33.2 sertraline (ZOLOFT) 50 MG tablet    Past Psychiatric History: Reviewed Patient has history of depression and anxiety. She has history of one psychiatric [facility] at [facility] few years ago due to severe depression and having suicidal thoughts. She was also briefly admitted at OBS unit in 2016. Patient denies any history of mania, psychosis, hallucination or any suicidal attempt.  Past Medical History:  Past Medical History:  Diagnosis Date  . Breast CA ([place]) 2006   right [facility])  . Cancer ([place])   . Helicobacter pylori gastritis 2001  . High blood pressure   . History of adenomatous polyp of colon 09/23/2017   Oma 09/25/2017 and apparently polyps in the past as well though recalled age  . History of shingles   . Major depressive disorder, recurrent, severe without psychotic  features ([place]) 06/24/2015  . Parotid mass 2014   right benign cyst  . Personal history of radiation therapy 2006   Rt breast  . Wears glasses     Past Surgical History:  Procedure Laterality Date  . ABDOMINAL HYSTERECTOMY  1982   BSO  . BREAST LUMPECTOMY WITH SENTINEL LYMPH NODE BIOPSY  2005   right  . BREAST SURGERY  2004   rt br mass  . CESAREAN SECTION    . COLONOSCOPY    . ESOPHAGOGASTRODUODENOSCOPY  2001   H pylori gastritis  . EYE SURGERY     both cataracts  . PAROTIDECTOMY Right 09/05/2013   Procedure: RIGHT SUPERFICIAL PAROTIDECTOMY WITH FACIAL NERVE DISECTION;  Surgeon: [name], MD;  Location: [facility];  Service: ENT;  Laterality: Right;  All documentation done by R.Ward after [phone] --done under G. Garzon's password  . RE-EXCISION OF BREAST LUMPECTOMY  2005   right    Family Psychiatric History:.  Family History:  Family History  Problem Relation Age of Onset  . Heart disease Mother        MI  . Cancer Father        bone  . Thyroid disease Neg Hx   . Hypercalcemia Neg Hx   . Colon cancer Neg Hx   . Stomach cancer Neg Hx   . Rectal cancer Neg Hx   . Esophageal cancer Neg Hx     Social History:  Social History   Socioeconomic History  . Marital status: Married    Spouse name: Not on file  . Number of children: 3  . Years of education: At least some college  . Highest education level: Not on file  Occupational History  . Occupation: Middle school Scientist, research (physical sciences): [employer]  . Financial resource strain: Not on file  . Food insecurity:    Worry: Not on file    Inability: Not on file  . Transportation needs:    Medical: Not on file    Non-medical: Not on file  Tobacco Use  . Smoking status: Never Smoker  . Smokeless tobacco: Former Systems developer    Types: Chew  Substance and Sexual Activity  . Alcohol use: Yes    Alcohol/week: 0.0 oz    Comment: occ  . Drug use: No  . Sexual  activity: Not Currently  Lifestyle  . Physical activity:    Days per week: Not on file    Minutes per session: Not on file  . Stress: Not on file  Relationships  . Social connections:    Talks on phone: Not on file    Gets together: Not on file    Attends religious service: Not on file    Active member of club or organization: Not on file    Attends meetings of clubs or organizations: Not on file    Relationship status: Not on file  Other Topics Concern  . 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 [place]   1 coffee one soft drink daily   08/02/2017    Allergies: No Known Allergies  Metabolic Disorder Labs: Recent Results (from the past 2160 hour(s))  POC Influenza A/B     Status: Normal   Collection Time: 02/28/18  4:22 PM  Result Value Ref Range   Influenza A, POC Negative Negative   Influenza B, POC Negative Negative  CBC with Differential/Platelets     Status: Abnormal   Collection Time: 02/28/18  4:33 PM  Result Value Ref Range   WBC 7.8 3.8 - 10.8 Thousand/uL   RBC 4.85 3.80 - 5.10 Million/uL   Hemoglobin 13.7 11.7 - 15.5 g/dL   HCT 41.3 35.0 - 45.0 %   MCV 85.2 80.0 - 100.0 fL   MCH 28.2 27.0 - 33.0 pg   MCHC 33.2 32.0 - 36.0 g/dL   RDW 12.9 11.0 - 15.0 %   Platelets 287 140 - 400 Thousand/uL   MPV 12.1 7.5 - 12.5 fL   Neutro Abs 4,797 1,500 - 7,800 cells/uL   Lymphs Abs 1,373 850 - 3,900 cells/uL   WBC mixed population 1,092 (H) 200 - 950 cells/uL   Eosinophils Absolute 374 15 - 500 cells/uL   Basophils Absolute 164 0 - 200 cells/uL   Neutrophils Relative % 61.5 %   Total Lymphocyte 17.6 %   Monocytes Relative 14.0 %   Eosinophils Relative 4.8 %   Basophils Relative 2.1 %  BASIC METABOLIC PANEL WITH GFR     Status: Abnormal   Collection Time:  02/28/18  4:33 PM  Result Value Ref Range   Glucose, Bld 93 65 - 139 mg/dL    Comment: .  Non-fasting reference interval .    BUN 10 7 - 25 mg/dL   Creat 0.81 0.60 - 0.93 mg/dL    Comment: For patients >52 years of age, the reference limit for Creatinine is approximately 13% higher for people identified as African-American. .    GFR, Est Non African American 71 > OR = 60 mL/min/1.74m2   GFR, Est African American 82 > OR = 60 mL/min/1.24m2   BUN/Creatinine Ratio NOT APPLICABLE 6 - 22 (calc)   Sodium 138 135 - 146 mmol/L   Potassium 4.3 3.5 - 5.3 mmol/L   Chloride 103 98 - 110 mmol/L   CO2 27 20 - 32 mmol/L   Calcium 10.5 (H) 8.6 - 10.4 mg/dL   No results found for: HGBA1C, MPG No results found for: PROLACTIN Lab Results  Component Value Date   CHOL 196 05/12/2017   TRIG 80 05/12/2017   HDL 54 05/12/2017   CHOLHDL 3.6 05/12/2017   VLDL 16 05/12/2017   LDLCALC 126 (H) 05/12/2017   LDLCALC 135 (H) 12/02/2016   Lab Results  Component Value Date   TSH 1.01 05/12/2017   TSH 0.72 04/20/2016    Therapeutic Level Labs: No results found for: LITHIUM No results found for: VALPROATE No components found for:  CBMZ  Current Medications: Current Outpatient Medications  Medication Sig Dispense Refill  . albuterol (VENTOLIN HFA) 108 (90 Base) MCG/ACT inhaler Inhale 2 puffs into the lungs every 6 (six) hours as needed for wheezing or shortness of breath. (Patient not taking: Reported on 04/12/2018) 6.7 g 0  . calcium-vitamin D (OSCAL WITH D) 500-200 MG-UNIT tablet Take 1 tablet by mouth daily.    Marland Kitchen lisinopril (PRINIVIL,ZESTRIL) 40 MG tablet Take 1 tablet (40 mg total) by mouth daily. Need to schedule an appointment. 90 tablet 3  . sertraline (ZOLOFT) 50 MG tablet Take 1 tablet (50 mg total) by mouth daily. 30 tablet 2   No current facility-administered medications for this visit.      Musculoskeletal: Strength & Muscle Tone: within normal limits Gait & Station:  normal Patient leans: N/A  Psychiatric Specialty Exam: ROS  There were no vitals taken for this visit.There is no height or weight on file to calculate BMI.  General Appearance: Casual  Eye Contact:  Good  Speech:  Clear and Coherent  Volume:  Normal  Mood:  Euthymic  Affect:  Appropriate and Congruent  Thought Process:  Goal Directed  Orientation:  Full (Time, Place, and Person)  Thought Content: Logical   Suicidal Thoughts:  No  Homicidal Thoughts:  No  Memory:  Immediate;   Good Recent;   Good Remote;   Good  Judgement:  Good  Insight:  Good  Psychomotor Activity:  Normal  Concentration:  Concentration: Good and Attention Span: Good  Recall:  Good  Fund of Knowledge: Good  Language: Good  Akathisia:  No  Handed:  Right  AIMS (if indicated): not done  Assets:  Communication Skills Desire for Improvement Housing Resilience Social Support  ADL's:  Intact  Cognition: WNL  Sleep:  Fair   Screenings: AIMS     Admission (Discharged) from 06/24/2015 in [place] [facility]  AIMS Total Score  0    AUDIT     Admission (Discharged) from 06/24/2015 in [place] [facility]  Alcohol Use Disorder Identification Test Final Score (AUDIT)  1    Mini-Mental     Office Visit from confidential encounter on 02/15/2015  Total Score (max  30 points )  29    PHQ2-9     Office Visit from confidential encounter on 04/12/2018 Office Visit from confidential encounter on 12/02/2016 Office Visit from confidential encounter on 02/15/2015 Office Visit from confidential encounter on 01/16/2015  PHQ-2 Total Score  0  2  0  2  PHQ-9 Total Score  -  -  -  2       Assessment and Plan: Major depressive disorder, recurrent.  Generalized anxiety disorder.  Patient doing better on her current dose of Zoloft.  She has no tremors shakes or any EPS.  She is not interested in counseling.  Continue Zoloft 50 mg daily.  We will do blood work on her next  appointment.  Recommended to call us back if she has any question, concern or if she feels worsening of the symptoms.  Follow-up in 3 months.   [name], MD 05/25/2018, 8:55 AM

## 2018-06-06 ENCOUNTER — Other Ambulatory Visit: Payer: Self-pay | Admitting: Internal Medicine

## 2018-06-06 DIAGNOSIS — Z1231 Encounter for screening mammogram for malignant neoplasm of breast: Secondary | ICD-10-CM

## 2018-06-13 ENCOUNTER — Other Ambulatory Visit: Payer: BC Managed Care – PPO

## 2018-06-13 DIAGNOSIS — I10 Essential (primary) hypertension: Secondary | ICD-10-CM

## 2018-06-13 DIAGNOSIS — E785 Hyperlipidemia, unspecified: Secondary | ICD-10-CM

## 2018-06-13 DIAGNOSIS — F332 Major depressive disorder, recurrent severe without psychotic features: Secondary | ICD-10-CM

## 2018-06-14 LAB — CBC WITH DIFFERENTIAL/PLATELET
BASOS PCT: 2 %
Basophils Absolute: 102 cells/uL (ref 0–200)
EOS PCT: 4.8 %
Eosinophils Absolute: 245 cells/uL (ref 15–500)
HCT: 40.7 % (ref 35.0–45.0)
HEMOGLOBIN: 13.5 g/dL (ref 11.7–15.5)
Lymphs Abs: 1770 cells/uL (ref 850–3900)
MCH: 28.4 pg (ref 27.0–33.0)
MCHC: 33.2 g/dL (ref 32.0–36.0)
MCV: 85.5 fL (ref 80.0–100.0)
MONOS PCT: 8.9 %
MPV: 11.8 fL (ref 7.5–12.5)
NEUTROS ABS: 2530 {cells}/uL (ref 1500–7800)
Neutrophils Relative %: 49.6 %
PLATELETS: 303 10*3/uL (ref 140–400)
RBC: 4.76 10*6/uL (ref 3.80–5.10)
RDW: 13.1 % (ref 11.0–15.0)
TOTAL LYMPHOCYTE: 34.7 %
WBC mixed population: 454 cells/uL (ref 200–950)
WBC: 5.1 10*3/uL (ref 3.8–10.8)

## 2018-06-14 LAB — URINALYSIS, ROUTINE W REFLEX MICROSCOPIC
Bilirubin Urine: NEGATIVE
Glucose, UA: NEGATIVE
HYALINE CAST: NONE SEEN /LPF
Hgb urine dipstick: NEGATIVE
Ketones, ur: NEGATIVE
Nitrite: NEGATIVE
Specific Gravity, Urine: 1.025 (ref 1.001–1.03)
pH: 5.5 (ref 5.0–8.0)

## 2018-06-14 LAB — COMPLETE METABOLIC PANEL WITH GFR
AG RATIO: 1.5 (calc) (ref 1.0–2.5)
ALKALINE PHOSPHATASE (APISO): 107 U/L (ref 33–130)
ALT: 8 U/L (ref 6–29)
AST: 13 U/L (ref 10–35)
Albumin: 4 g/dL (ref 3.6–5.1)
BILIRUBIN TOTAL: 0.6 mg/dL (ref 0.2–1.2)
BUN: 15 mg/dL (ref 7–25)
CALCIUM: 10.3 mg/dL (ref 8.6–10.4)
CHLORIDE: 109 mmol/L (ref 98–110)
CO2: 23 mmol/L (ref 20–32)
Creat: 0.86 mg/dL (ref 0.60–0.93)
GFR, EST NON AFRICAN AMERICAN: 66 mL/min/{1.73_m2} (ref >=60)
GFR, Est African American: 76 mL/min/{1.73_m2} (ref >=60)
GLOBULIN: 2.7 g/dL (ref 1.9–3.7)
Glucose, Bld: 109 mg/dL — ABNORMAL HIGH (ref 65–99)
POTASSIUM: 4.2 mmol/L (ref 3.5–5.3)
SODIUM: 141 mmol/L (ref 135–146)
Total Protein: 6.7 g/dL (ref 6.1–8.1)

## 2018-06-14 LAB — LIPID PANEL
Cholesterol: 220 mg/dL — ABNORMAL HIGH (ref <200)
HDL: 50 mg/dL — AB (ref >50)
LDL CHOLESTEROL (CALC): 149 mg/dL — AB
NON-HDL CHOLESTEROL (CALC): 170 mg/dL — AB (ref <130)
Total CHOL/HDL Ratio: 4.4 (calc) (ref <5.0)
Triglycerides: 98 mg/dL (ref <150)

## 2018-06-14 LAB — TSH: TSH: 1.09 mIU/L (ref 0.40–4.50)

## 2018-06-15 ENCOUNTER — Ambulatory Visit (INDEPENDENT_AMBULATORY_CARE_PROVIDER_SITE_OTHER): Payer: BC Managed Care – PPO | Admitting: Internal Medicine

## 2018-06-15 ENCOUNTER — Encounter: Payer: Self-pay | Admitting: Internal Medicine

## 2018-06-15 VITALS — BP 142/80 | HR 93 | Temp 98.2°F | Resp 20 | Ht 63.0 in | Wt 170.8 lb

## 2018-06-15 DIAGNOSIS — J309 Allergic rhinitis, unspecified: Secondary | ICD-10-CM

## 2018-06-15 DIAGNOSIS — I10 Essential (primary) hypertension: Secondary | ICD-10-CM | POA: Diagnosis not present

## 2018-06-15 DIAGNOSIS — Z0000 Encounter for general adult medical examination without abnormal findings: Secondary | ICD-10-CM

## 2018-06-15 DIAGNOSIS — Z23 Encounter for immunization: Secondary | ICD-10-CM | POA: Diagnosis not present

## 2018-06-15 DIAGNOSIS — E785 Hyperlipidemia, unspecified: Secondary | ICD-10-CM

## 2018-06-15 DIAGNOSIS — F332 Major depressive disorder, recurrent severe without psychotic features: Secondary | ICD-10-CM

## 2018-06-15 DIAGNOSIS — F17229 Nicotine dependence, chewing tobacco, with unspecified nicotine-induced disorders: Secondary | ICD-10-CM

## 2018-06-15 DIAGNOSIS — K219 Gastro-esophageal reflux disease without esophagitis: Secondary | ICD-10-CM | POA: Diagnosis not present

## 2018-06-15 DIAGNOSIS — N644 Mastodynia: Secondary | ICD-10-CM | POA: Diagnosis not present

## 2018-06-15 MED ORDER — ALBUTEROL SULFATE HFA 108 (90 BASE) MCG/ACT IN AERS: 2.0000 | INHALATION_SPRAY | Freq: Four times a day (QID) | RESPIRATORY_TRACT | 0 refills | Status: DC | PRN

## 2018-06-15 MED ORDER — ESOMEPRAZOLE MAGNESIUM 40 MG PO CPDR: 40.0000 mg | DELAYED_RELEASE_CAPSULE | Freq: Every day | ORAL | 2 refills | Status: DC

## 2018-06-15 NOTE — Patient Instructions (Addendum)
Reduce caffeinated products to help with breast pain  Follow up with mammogram as scheduled  START NEXIUM 40MG  DAILY FOR ACID REFLUX X 8 WEEKS  Continue other medications as ordered  STOP CHEWING TOBACCO!  TETANUS VACCINE GIVEN TODAY  Follow up in 8 weeks for GERD, breast pain  Keeping You Healthy  Get These Tests  Blood Pressure- Have your blood pressure checked by your healthcare provider at least once a year.  Normal blood pressure is 120/80.  Weight- Have your body mass index (BMI) calculated to screen for obesity.  BMI is a measure of body fat based on height and weight.  You can calculate your own BMI at [employer]  Cholesterol- Have your cholesterol checked every year.  Diabetes- Have your blood sugar checked every year if you have high blood pressure, high cholesterol, a family history of diabetes or if you are overweight.  Pap Test - Have a pap test every 1 to 5 years if you have been sexually active.  If you are older than 65 and recent pap tests have been normal you may not need additional pap tests.  In addition, if you have had a hysterectomy  for benign disease additional pap tests are not necessary.  Mammogram-Yearly mammograms are essential for early detection of breast cancer  Screening for Colon Cancer- Colonoscopy starting at age 38. Screening may begin sooner depending on your family history and other health conditions.  Follow up colonoscopy as directed by your Gastroenterologist.  Screening for Osteoporosis- Screening begins at age 24 with bone density scanning, sooner if you are at higher risk for developing Osteoporosis.  Get these medicines  Calcium with Vitamin D- Your body requires 1200-1500 mg of Calcium a day and [phone] IU of Vitamin D a day.  You can only absorb 500 mg of Calcium at a time therefore Calcium must be taken in 2 or 3 separate doses throughout the day.  Hormones- Hormone therapy has been associated with increased risk  for certain cancers and heart disease.  Talk to your healthcare provider about if you need relief from menopausal symptoms.  Aspirin- Ask your healthcare provider about taking Aspirin to prevent Heart Disease and Stroke.  Get these Immuniztions  Flu shot- Every fall  Pneumonia shot- Once after the age of 35; if you are younger ask your healthcare provider if you need a pneumonia shot.  Tetanus- Every ten years.  Zostavax- Once after the age of 23 to prevent shingles.  Take these steps  Don't smoke- Your healthcare provider can help you quit. For tips on how to quit, ask your healthcare provider or go to www.smokefree.gov or call 1-800 QUIT-NOW.  Be physically active- Exercise 5 days a week for a minimum of 30 minutes.  If you are not already physically active, start slow and gradually work up to 30 minutes of moderate physical activity.  Try walking, dancing, bike riding, swimming, etc.  Eat a healthy diet- Eat a variety of healthy foods such as fruits, vegetables, whole grains, low fat milk, low fat cheeses, yogurt, lean meats, chicken, fish, eggs, dried beans, tofu, etc.  For more information go to www.thenutritionsource.org  Dental visit- Brush and floss teeth twice daily; visit your dentist twice a year.  Eye exam- Visit your Optometrist or Ophthalmologist yearly.  Drink alcohol in moderation- Limit alcohol intake to one drink or less a day.  Never drink and drive.  Depression- Your emotional health is as important as your physical health.  If you're feeling down  or losing interest in things you normally enjoy, please talk to your healthcare provider.  Seat Belts- can save your life; always wear one  Smoke/Carbon Monoxide detectors- These detectors need to be installed on the appropriate level of your home.  Replace batteries at least once a year.  Violence- If anyone is threatening or hurting you, please tell your healthcare provider.  Living Will/ Health care power of  attorney- Discuss with your healthcare provider and family.

## 2018-06-15 NOTE — Progress Notes (Signed)
Patient ID: RAVONDA BRECHEEN, female   DOB: 14-Jan-1941, 77 y.o.   MRN: [id number]   Location:  [facility]  Place of Service:  OFFICE  Provider: Arletha Grippe, DO  Patient Care Team: Gildardo Cranker, DO as PCP - General (Internal Medicine) [name], MD as Consulting Physician (Otolaryngology)  Extended Emergency Contact Information Primary Emergency Contact: Favia,[name] Address: [address]          [place], [place] [address] [name] of [place] Phone: [phone] Mobile Phone: [phone] Relation: Spouse  Code Status:  Goals of Care: Advanced Directive information Advanced Directives 09/15/2017  Does Patient Have a Medical Advance Directive? No  Would patient like information on creating a medical advance directive? -  Some encounter information is confidential and restricted. Go to Review Flowsheets activity to see all data.     Chief Complaint  Patient presents with  . Annual Exam    HPI: Patient is a 77 y.o. female seen in today for a comprehensive exam.  Chronic tobacco use (chews) - She has a persistent cough that is worse at night  >1 yr. Cough interrupts sleep. She does notice that when she chews tobacco at night the cough begans and does not stop even after spitting out tobacco. She does not use tobacco during the day time. She gets light headed at times. Occasional dysphagia with solids. She feels a lump in anterior neck. She is c/a cancer. She has been chewing tobacco since age 77. She underwent neck bx in Aug 2017 by ENT Dr Lucia Gaskins and results revealed no cancer cells  Vaginal dryness - ongoing. she is c/a estrogen effects due to hx breast cancer  HTN - BP stable on lisinopril. She no longer takes HCTZ. No HA or dizziness. No CP or SOB. She does admit that she did not take her medication consistently. She was seen in the ED on 02/07/17 for HTN. BP at that time 253/84. She was tx with IV BP med and at d/c 171/84  Depression - stable on zoloft.  She is followed by psych Dr Dossie Der. No SI/HI    Depression screen [facility] 2/9 06/15/2018 04/12/2018 12/02/2016 02/15/2015 01/16/2015  Decreased Interest 0 0 1 0 1  Down, Depressed, Hopeless 0 0 1 0 1  PHQ - 2 Score 0 0 2 0 2  Altered sleeping - - - - 0  Tired, decreased energy - - - - 0  Change in appetite - - - - 0  Feeling bad or failure about yourself  - - - - 0  Trouble concentrating - - - - 0  Moving slowly or fidgety/restless - - - - 0  Suicidal thoughts - - - - 0  PHQ-9 Score - - - - 2    Fall Risk  06/15/2018 04/12/2018 03/02/2018 02/28/2018 05/12/2017  Falls in the past year? No No No No No   MMSE - Mini Mental State Exam 02/15/2015  Orientation to time 4  Orientation to Place 5  Registration 3  Attention/ Calculation 5  Recall 3  Language- name 2 objects 2  Language- repeat 1  Language- follow 3 step command 3  Language- read & follow direction 1  Write a sentence 1  Copy design 1  Total score 29     Health Maintenance  Topic Date Due  . TETANUS/TDAP  12/20/1960  . INFLUENZA VACCINE  07/07/2018  . DEXA SCAN  Completed    Urinary incontinence? No issues  Functional Status Survey: Is the  patient deaf or have difficulty hearing?: No Does the patient have difficulty seeing, even when wearing glasses/contacts?: No Does the patient have difficulty concentrating, remembering, or making decisions?: No Does the patient have difficulty walking or climbing stairs?: No Does the patient have difficulty dressing or bathing?: No Does the patient have difficulty doing errands alone such as visiting a doctor's office or shopping?: No  Exercise? As tolerated  Diet? Poor food choices recently  No exam data present  Hearing:no issues    Dentition: sees dentist on a regular basis  Pain: controlled  Past Medical History:  Diagnosis Date  . Breast CA ([place]) 2006   right [facility])  . Cancer ([place])   . Helicobacter pylori gastritis 2001  . High blood pressure   . History of  adenomatous polyp of colon 09/23/2017   Oma 09/25/2017 and apparently polyps in the past as well though recalled age  . History of shingles   . Major depressive disorder, recurrent, severe without psychotic features ([place]) 06/24/2015  . Parotid mass 2014   right benign cyst  . Personal history of radiation therapy 2006   Rt breast  . Wears glasses     Past Surgical History:  Procedure Laterality Date  . ABDOMINAL HYSTERECTOMY  1982   BSO  . BREAST LUMPECTOMY WITH SENTINEL LYMPH NODE BIOPSY  2005   right  . BREAST SURGERY  2004   rt br mass  . CESAREAN SECTION    . COLONOSCOPY    . ESOPHAGOGASTRODUODENOSCOPY  2001   H pylori gastritis  . EYE SURGERY     both cataracts  . PAROTIDECTOMY Right 09/05/2013   Procedure: RIGHT SUPERFICIAL PAROTIDECTOMY WITH FACIAL NERVE DISECTION;  Surgeon: [name], MD;  Location: [place] [facility];  Service: ENT;  Laterality: Right;  All documentation done by R.Ward after [phone] --done under G. Garzon's password  . RE-EXCISION OF BREAST LUMPECTOMY  2005   right    Family History  Problem Relation Age of Onset  . Heart disease Mother        MI  . Cancer Father        bone  . Thyroid disease Neg Hx   . Hypercalcemia Neg Hx   . Colon cancer Neg Hx   . Stomach cancer Neg Hx   . Rectal cancer Neg Hx   . Esophageal cancer Neg Hx    Family Status  Relation Name Status  . Mother  Deceased at age 53  . Father  Deceased at age 27  . Neg Hx  (Not Specified)    Social History   Socioeconomic History  . Marital status: Married    Spouse name: Not on file  . Number of children: 3  . Years of education: At least some college  . Highest education level: Not on file  Occupational History  . Occupation: Middle school Scientist, research (physical sciences): [employer]  . Financial resource strain: Not on file  . Food insecurity:    Worry: Not on file    Inability: Not on file  . Transportation needs:     Medical: Not on file    Non-medical: Not on file  Tobacco Use  . Smoking status: Never Smoker  . Smokeless tobacco: Former Systems developer    Types: Chew  Substance and Sexual Activity  . Alcohol use: Yes    Alcohol/week: 0.0 oz    Comment: occ  . Drug use: No  . Sexual activity:  Not Currently  Lifestyle  . Physical activity:    Days per week: Not on file    Minutes per session: Not on file  . Stress: Not on file  Relationships  . Social connections:    Talks on phone: Not on file    Gets together: Not on file    Attends religious service: Not on file    Active member of club or organization: Not on file    Attends meetings of clubs or organizations: Not on file    Relationship status: Not on file  . Intimate partner violence:    Fear of current or ex partner: Not on file    Emotionally abused: Not on file    Physically abused: Not on file    Forced sexual activity: Not on file  Other Topics Concern  . 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 Always use your most recent med list.          albuterol 108 (90 Base) MCG/ACT inhaler Commonly known as:  VENTOLIN HFA Inhale 2 puffs into the lungs every 6 (six) hours as needed for wheezing or shortness of breath.   calcium-vitamin D 500-200 MG-UNIT tablet Commonly known as:  OSCAL WITH D Take 1 tablet by mouth daily.   esomeprazole 40 MG capsule Commonly known as:  NEXIUM Take 1 capsule (40 mg total) by mouth daily. TAKE FOR 8 WEEKS   lisinopril 40 MG tablet Commonly known as:  PRINIVIL,ZESTRIL Take 1 tablet (40 mg total) by mouth  daily. Need to schedule an appointment.   sertraline 50 MG tablet Commonly known as:  ZOLOFT Take 1 tablet (50 mg total) by mouth daily.        Review of Systems:  Review of Systems  Psychiatric/Behavioral: Positive for dysphoric mood.  All other systems reviewed and are negative.   Physical Exam: Vitals:   06/15/18 0949  BP: (!) 142/80  Pulse: 93  Resp: 20  Temp: 98.2 F (36.8 C)  TempSrc: Oral  SpO2: 95%  Weight: 170 lb 12.8 oz (77.5 kg)  Height: 5\' 3"  (1.6 m)   Body mass index is 30.26 kg/m. Physical Exam  Constitutional: She is oriented to person, place, and time. She appears well-developed and well-nourished. No distress.  HENT:  Head: Normocephalic and atraumatic.  Right Ear: Hearing, tympanic membrane, external ear and ear canal normal.  Left Ear: Hearing, tympanic membrane, external ear and ear canal normal.  Mouth/Throat: Uvula is midline, oropharynx is clear and moist and mucous membranes are normal. She does not have dentures.  Eyes: Pupils are equal, round, and reactive to light. Conjunctivae, EOM and lids are normal. No scleral icterus.  Neck: Trachea normal and normal range of motion. Neck supple. Carotid bruit is not present. No thyroid mass and no thyromegaly present.  Cardiovascular: Normal rate, regular rhythm, normal heart sounds and intact distal pulses. Exam reveals no gallop and no friction rub.  No murmur heard. No LE edema b/l. No calf TTP.  Pulmonary/Chest: Effort normal and breath sounds normal. She has no wheezes. She has no rhonchi. She has no rales. She exhibits no mass. Right breast exhibits skin change (due to scar tissue). Right breast exhibits no inverted nipple, no mass, no nipple discharge and no tenderness. Left breast exhibits tenderness (left outer quadrant with palpable cyst). Left breast exhibits no inverted nipple, no mass, no nipple discharge and no skin change. Breasts are symmetrical.    Abdominal: Soft. Normal appearance,  normal aorta and bowel sounds are normal. She exhibits distension. She exhibits no pulsatile midline mass and no mass. There is no hepatosplenomegaly. There is tenderness (epigastric). There is no rigidity, no rebound and no guarding. No hernia.  Genitourinary:  Genitourinary Comments: Deferred due to age; colonoscopy done in Oct 2018  Musculoskeletal: Normal range of motion. She exhibits no deformity.  Lymphadenopathy:       Head (right side): No posterior auricular adenopathy present.       Head (left side): No posterior auricular adenopathy present.    She has no cervical adenopathy.       Right: No supraclavicular adenopathy present.       Left: No supraclavicular adenopathy present.  Neurological: She is alert and oriented to person, place, and time. She has normal strength and normal reflexes. No cranial nerve deficit. Gait normal.  Skin: Skin is warm, dry and intact. No rash noted. Nails show no clubbing.  Psychiatric: She has a normal mood and affect. Her speech is normal and behavior is normal. Thought content normal. Cognition and memory are normal.    Labs reviewed:  Basic Metabolic Panel: Recent Labs    02/28/18 1633 06/13/18 0910  NA 138 141  K 4.3 4.2  CL 103 109  CO2 27 23  GLUCOSE 93 109*  BUN 10 15  CREATININE 0.81 0.86  CALCIUM 10.5* 10.3  TSH  --  1.09   Liver Function Tests: Recent Labs    06/13/18 0910  AST 13  ALT 8  BILITOT 0.6  PROT 6.7   No results for input(s): LIPASE, AMYLASE in the last 8760 hours. No results for input(s): AMMONIA in the last 8760 hours. CBC: Recent Labs    02/28/18 1633 06/13/18 0910  WBC 7.8 5.1  NEUTROABS 4,797 2,530  HGB 13.7 13.5  HCT 41.3 40.7  MCV 85.2 85.5  PLT 287 303   Lipid Panel: Recent Labs    06/13/18 0910  CHOL 220*  HDL 50*  LDLCALC 149*  TRIG 98  CHOLHDL 4.4   No results found for: HGBA1C  Procedures: No results found. ECG OBTAINED AND REVIEWED BY MYSELF: NSR @ 72 bpm, nml axis, LAE,  poor R wave progression. No acute ischemic changes. No change since 02/2017  Assessment/Plan   ICD-10-CM   1. Annual physical exam Z00.00 Electrocardiogram report  2. Allergic rhinitis, unspecified seasonality, unspecified trigger J30.9 albuterol (VENTOLIN HFA) 108 (90 Base) MCG/ACT inhaler  3. Chewing tobacco nicotine dependence with nicotine-induced disorder F17.229   4. Hyperlipidemia LDL goal <130 E78.5   5. Essential hypertension, benign I10   6. Major depressive disorder, recurrent, severe without psychotic features ([place]) F33.2   7. Breast pain, left N64.4    probably fibrocystic  8. Gastroesophageal reflux disease, esophagitis presence not specified K21.9 esomeprazole (NEXIUM) 40 MG capsule  9. Need for Tdap vaccination Z23 Tdap vaccine greater than or equal to 7yo IM   Reduce caffeinated products to help with breast pain  Follow up with mammogram as scheduled  START NEXIUM 40MG  DAILY FOR ACID REFLUX X 8 WEEKS  Continue other medications as ordered  STOP CHEWING TOBACCO!  TETANUS VACCINE GIVEN TODAY  Follow up in 8 weeks for GERD, breast pain  Keeping You Healthy handout given  Cordella Register. Perlie Gold  [facility] [address] [place], [place] [address] [phone] Cell (Monday-Friday 8 AM - 5 PM) [phone] After 5 PM and follow prompts

## 2018-06-16 DIAGNOSIS — J309 Allergic rhinitis, unspecified: Secondary | ICD-10-CM | POA: Insufficient documentation

## 2018-06-16 DIAGNOSIS — F17229 Nicotine dependence, chewing tobacco, with unspecified nicotine-induced disorders: Secondary | ICD-10-CM | POA: Insufficient documentation

## 2018-06-30 ENCOUNTER — Ambulatory Visit: Payer: Self-pay

## 2018-07-11 ENCOUNTER — Ambulatory Visit
Admission: RE | Admit: 2018-07-11 | Discharge: 2018-07-11 | Disposition: A | Discharge status: Home / Self Care | Payer: BC Managed Care – PPO | Source: Ambulatory Visit | Attending: Internal Medicine | Admitting: Internal Medicine

## 2018-07-11 DIAGNOSIS — Z1231 Encounter for screening mammogram for malignant neoplasm of breast: Secondary | ICD-10-CM

## 2018-07-27 ENCOUNTER — Encounter: Payer: Self-pay | Admitting: Internal Medicine

## 2018-08-10 ENCOUNTER — Ambulatory Visit: Payer: BC Managed Care – PPO | Admitting: Internal Medicine

## 2018-08-25 ENCOUNTER — Ambulatory Visit (HOSPITAL_COMMUNITY): Payer: Self-pay | Admitting: Psychiatry

## 2018-08-30 ENCOUNTER — Ambulatory Visit: Payer: BC Managed Care – PPO | Admitting: Internal Medicine

## 2018-08-30 ENCOUNTER — Ambulatory Visit (HOSPITAL_COMMUNITY)
Admission: EM | Admit: 2018-08-30 | Discharge: 2018-08-30 | Disposition: A | Discharge status: Home / Self Care | Payer: BC Managed Care – PPO | Attending: Family Medicine | Admitting: Family Medicine

## 2018-08-30 ENCOUNTER — Other Ambulatory Visit: Payer: Self-pay

## 2018-08-30 ENCOUNTER — Ambulatory Visit (INDEPENDENT_AMBULATORY_CARE_PROVIDER_SITE_OTHER): Payer: BC Managed Care – PPO

## 2018-08-30 DIAGNOSIS — M1120 Other chondrocalcinosis, unspecified site: Secondary | ICD-10-CM

## 2018-08-30 MED ORDER — PREDNISONE 10 MG (21) PO TBPK: ORAL_TABLET | ORAL | 0 refills | Status: DC

## 2018-08-30 NOTE — Discharge Instructions (Addendum)
It was nice meeting you!!  You have chondrocalcinosis which is a buildup of calcium in the cartilage in your knee. We will treat this with prednisone and you should get some improvement. We will also give you a knee sleeve to see if this helps. Stop the calcium supplement for now and follow up with you primary care.

## 2018-08-30 NOTE — ED Provider Notes (Signed)
[facility]    CSN: [id number] Arrival date & time: 08/30/18  1228     History   Chief Complaint Chief Complaint  Patient presents with  . Leg Pain    HPI JOSILYNN Cunningham is a 77 y.o. female.   Patient is a 77 year old female that presents with right knee pain just below to the patella and medial to the patella that was sudden onset yesterday.  She denies any injury to the knee.  Reports she wrapped the knee with Ace wrap, took ibuprofen and elevated the leg which helped.  She was concerned because this morning she woke up and had swelling in her entire right lower extremity.  Although,  the pain has improved.   She denies any history of blood clots, prior knee problems.  She denies any fever, chills, insect bites, rashes.  She denies any calf pain. No hx of gout.   ROS per HPI      Past Medical History:  Diagnosis Date  . Breast CA ([place]) 2006   right [facility])  . Cancer ([place])   . Helicobacter pylori gastritis 2001  . High blood pressure   . History of adenomatous polyp of colon 09/23/2017   Oma 09/25/2017 and apparently polyps in the past as well though recalled age  . History of shingles   . Major depressive disorder, recurrent, severe without psychotic features ([place]) 06/24/2015  . Parotid mass 2014   right benign cyst  . Personal history of radiation therapy 2006   Rt breast  . Wears glasses     Patient Active Problem List   Diagnosis Date Noted  . Allergic rhinitis 06/16/2018  . Chewing tobacco nicotine dependence with nicotine-induced disorder 06/16/2018  . History of adenomatous polyp of colon 09/23/2017  . Hypercalcemia 04/20/2016  . Neck nodule 04/20/2016  . Essential hypertension, benign 07/21/2015  . Hyperlipidemia LDL goal <130 07/21/2015  . Abnormal fasting glucose 07/21/2015  . History of breast cancer in female 07/21/2015  . Major depressive disorder, recurrent, severe without psychotic features ([place]) 06/24/2015  . MDD (major depressive  disorder), recurrent episode, severe ([place]) 06/24/2015  . Onychomycosis 06/12/2014  . Pain in lower limb 06/12/2014    Past Surgical History:  Procedure Laterality Date  . ABDOMINAL HYSTERECTOMY  1982   BSO  . BREAST LUMPECTOMY WITH SENTINEL LYMPH NODE BIOPSY  2005   right  . BREAST SURGERY  2004   rt br mass  . CESAREAN SECTION    . COLONOSCOPY    . ESOPHAGOGASTRODUODENOSCOPY  2001   H pylori gastritis  . EYE SURGERY     both cataracts  . PAROTIDECTOMY Right 09/05/2013   Procedure: RIGHT SUPERFICIAL PAROTIDECTOMY WITH FACIAL NERVE DISECTION;  Surgeon: [name], MD;  Location: [facility];  Service: ENT;  Laterality: Right;  All documentation done by R.Ward after [phone] --done under G. Garzon's password  . RE-EXCISION OF BREAST LUMPECTOMY  2005   right    OB History   None      Home Medications    Prior to Admission medications   Medication Sig Start Date End Date Taking? Authorizing Provider  albuterol (VENTOLIN HFA) 108 (90 Base) MCG/ACT inhaler Inhale 2 puffs into the lungs every 6 (six) hours as needed for wheezing or shortness of breath. 06/15/18   Gildardo Cranker, DO  calcium-vitamin D (OSCAL WITH D) 500-200 MG-UNIT tablet Take 1 tablet by mouth daily.    [provider]  esomeprazole (NEXIUM) 40 MG  capsule Take 1 capsule (40 mg total) by mouth daily. TAKE FOR 8 WEEKS 06/15/18   Gildardo Cranker, DO  lisinopril (PRINIVIL,ZESTRIL) 40 MG tablet Take 1 tablet (40 mg total) by mouth daily. Need to schedule an appointment. 07/12/17   Gildardo Cranker, DO  predniSONE (STERAPRED UNI-PAK 21 TAB) 10 MG (21) TBPK tablet 6 tabs for 1 day, then 5 tabs for 1 das, then 4 tabs for 1 day, then 3 tabs for 1 day, 2 tabs for 1 day, then 1 tab for 1 day 08/30/18   Loura Halt A, NP  sertraline (ZOLOFT) 50 MG tablet Take 1 tablet (50 mg total) by mouth daily. 05/25/18   Arfeen, Arlyce Harman, MD    Family History Family History  Problem Relation Age of Onset  . Heart  disease Mother        MI  . Cancer Father        bone  . Thyroid disease Neg Hx   . Hypercalcemia Neg Hx   . Colon cancer Neg Hx   . Stomach cancer Neg Hx   . Rectal cancer Neg Hx   . Esophageal cancer Neg Hx     Social History Social History   Tobacco Use  . Smoking status: Never Smoker  . Smokeless tobacco: Former Systems developer    Types: Chew  Substance Use Topics  . Alcohol use: Yes    Alcohol/week: 0.0 standard drinks    Comment: occ  . Drug use: No     Allergies   Patient has no known allergies.   Review of Systems Review of Systems   Physical Exam Triage Vital Signs ED Triage Vitals  Enc Vitals Group     BP 08/30/18 1346 (!) 144/63     Pulse Rate 08/30/18 1346 76     Resp 08/30/18 1346 18     Temp 08/30/18 1346 98.6 F (37 C)     Temp Source 08/30/18 1346 Oral     SpO2 08/30/18 1346 100 %     Weight 08/30/18 1347 177 lb (80.3 kg)     Height --      Head Circumference --      Peak Flow --      Pain Score --      Pain Loc --      Pain Edu? --      Excl. in [place]? --    No data found.  Updated Vital Signs BP (!) 144/63 (BP Location: Right Arm)   Pulse 76   Temp 98.6 F (37 C) (Oral)   Resp 18   Wt 177 lb (80.3 kg)   SpO2 100%   BMI 31.35 kg/m   Visual Acuity Right Eye Distance:   Left Eye Distance:   Bilateral Distance:    Right Eye Near:   Left Eye Near:    Bilateral Near:     Physical Exam   UC Treatments / Results  Labs (all labs ordered are listed, but only abnormal results are displayed) Labs Reviewed - No data to display  EKG None  Radiology Dg Knee Complete 4 Views Right  Result Date: 08/30/2018 CLINICAL DATA:  Acute knee pain with swelling, no injury EXAM: RIGHT KNEE - COMPLETE 4+ VIEW COMPARISON:  None. FINDINGS: There is tricompartmental degenerative joint disease of the right knee with joint space loss medially and at the patellofemoral articulation. The posterior patellar margin is irregular. No fracture is seen. Only a  tiny amount of joint fluid is present. However there  is chondrocalcinosis present whether linear distribution most consistent with CPPD arthropathy. Correlate clinically. IMPRESSION: 1. Tricompartmental degenerative joint disease. 2. Tiny amount of fluid with chondrocalcinosis. Suspect CPPD arthropathy. Electronically Signed   By: Ivar Drape M.D.   On: 08/30/2018 14:16    Procedures Procedures (including critical care time)  Medications Ordered in UC Medications - No data to display  Initial Impression / Assessment and Plan / UC Course  I have reviewed the triage vital signs and the nursing notes.  Pertinent labs & imaging results that were available during my care of the patient were reviewed by me and considered in my medical decision making (see chart for details).     X ray revealed   Tricompartmental degenerative joint disease.  Tiny amount of fluid with chondrocalcinosis. Suspect CPPD Arthropathy.  Will treat with prednisone and have her follow up with her PCP.  Ace wrap or knee sleeve for comfort and swelling. She has both of these at home.   Final Clinical Impressions(s) / UC Diagnoses   Final diagnoses:  Chondrocalcinosis     Discharge Instructions     It was nice meeting you!!  You have chondrocalcinosis which is a buildup of calcium in the cartilage in your knee. We will treat this with prednisone and you should get some improvement. We will also give you a knee sleeve to see if this helps. Stop the calcium supplement for now and follow up with you primary care.       ED Prescriptions    Medication Sig Dispense Auth. Provider   predniSONE (STERAPRED UNI-PAK 21 TAB) 10 MG (21) TBPK tablet 6 tabs for 1 day, then 5 tabs for 1 das, then 4 tabs for 1 day, then 3 tabs for 1 day, 2 tabs for 1 day, then 1 tab for 1 day 21 tablet Rozanna Box, Aerica Rincon A, NP     Controlled Substance Prescriptions [place] Controlled Substance Registry consulted? Not Applicable   Orvan July,  NP 08/31/18 1346

## 2019-02-20 ENCOUNTER — Telehealth: Payer: Self-pay | Admitting: *Deleted

## 2019-02-20 NOTE — Telephone Encounter (Signed)
Patient called and left message on Clinical intake requesting information on the COVID 19. 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   Tried calling patient back, LMOM to return call.

## 2019-02-21 ENCOUNTER — Ambulatory Visit: Payer: BC Managed Care – PPO | Admitting: Family

## 2019-02-23 NOTE — Telephone Encounter (Signed)
Patient called with Concerns regarding COVID-19.  Discussed with patient.  Anyone with lung disease is at a higher risk of complications associated with the COVID-19 if they contract the illness.  At this time advising to limit exposure in the community. The following general measures are recommended to reduce transmission of the infection: -Diligent hand washing, particularly after touching surfaces in public. Use of hand sanitizer that contains at least 60% alcohol is a reasonable alternative if the hands are not visibly dirty.  -Respiratory hygiene (eg, covering the cough or sneeze) -Avoiding crowds (particularly in poorly ventilated spaces) if possible and avoiding close contact with ill individuals.  -Cleaning and disinfecting objects and surfaces that are frequently touched.   Refer to the CDC guidelines online if you need more information.

## 2019-02-24 ENCOUNTER — Ambulatory Visit (INDEPENDENT_AMBULATORY_CARE_PROVIDER_SITE_OTHER): Payer: BC Managed Care – PPO | Admitting: Nurse Practitioner

## 2019-02-24 ENCOUNTER — Encounter: Payer: Self-pay | Admitting: Nurse Practitioner

## 2019-02-24 ENCOUNTER — Other Ambulatory Visit: Payer: Self-pay

## 2019-02-24 VITALS — BP 126/80 | HR 81 | Temp 98.4°F | Ht 63.0 in | Wt 169.0 lb

## 2019-02-24 DIAGNOSIS — R739 Hyperglycemia, unspecified: Secondary | ICD-10-CM | POA: Diagnosis not present

## 2019-02-24 DIAGNOSIS — F332 Major depressive disorder, recurrent severe without psychotic features: Secondary | ICD-10-CM

## 2019-02-24 DIAGNOSIS — R05 Cough: Secondary | ICD-10-CM | POA: Diagnosis not present

## 2019-02-24 DIAGNOSIS — E782 Mixed hyperlipidemia: Secondary | ICD-10-CM

## 2019-02-24 DIAGNOSIS — R059 Cough, unspecified: Secondary | ICD-10-CM

## 2019-02-24 DIAGNOSIS — M67431 Ganglion, right wrist: Secondary | ICD-10-CM

## 2019-02-24 DIAGNOSIS — F17229 Nicotine dependence, chewing tobacco, with unspecified nicotine-induced disorders: Secondary | ICD-10-CM

## 2019-02-24 DIAGNOSIS — I10 Essential (primary) hypertension: Secondary | ICD-10-CM

## 2019-02-24 DIAGNOSIS — J309 Allergic rhinitis, unspecified: Secondary | ICD-10-CM | POA: Diagnosis not present

## 2019-02-24 MED ORDER — LOSARTAN POTASSIUM 100 MG PO TABS: 100.0000 mg | ORAL_TABLET | Freq: Every day | ORAL | 3 refills | Status: DC

## 2019-02-24 MED ORDER — SERTRALINE HCL 50 MG PO TABS: 50.0000 mg | ORAL_TABLET | Freq: Every day | ORAL | 0 refills | Status: DC

## 2019-02-24 MED ORDER — ALBUTEROL SULFATE HFA 108 (90 BASE) MCG/ACT IN AERS: 2.0000 | INHALATION_SPRAY | Freq: Four times a day (QID) | RESPIRATORY_TRACT | 2 refills | Status: DC | PRN

## 2019-02-24 NOTE — Patient Instructions (Addendum)
STOP lisinopril START losartan for blood pressure To take blood pressure at home after you have taken your medication and record - take blood pressure ~3 times a week Follow up in 2 weeks for blood pressure check (we may be able to do a telephone visit with you if you are able to take blood pressure at home.  Heart-Healthy Eating Plan Many factors influence your heart (coronary) health, including eating and exercise habits. Coronary risk increases with abnormal blood fat (lipid) levels. Heart-healthy meal planning includes limiting unhealthy fats, increasing healthy fats, and making other diet and lifestyle changes.  What are tips for following this plan? Cooking Cook foods using methods other than frying. Baking, boiling, grilling, and broiling are all good options. Other ways to reduce fat include:  Removing the skin from poultry.  Removing all visible fats from meats.  Steaming vegetables in water or broth. Meal planning   At meals, imagine dividing your plate into fourths: ? Fill one-half of your plate with vegetables and green salads. ? Fill one-fourth of your plate with whole grains. ? Fill one-fourth of your plate with lean protein foods.  Eat 4-5 servings of vegetables per day. One serving equals 1 cup raw or cooked vegetable, or 2 cups raw leafy greens.  Eat 4-5 servings of fruit per day. One serving equals 1 medium whole fruit,  cup dried fruit,  cup fresh, frozen, or canned fruit, or  cup 100% fruit juice.  Eat more foods that contain soluble fiber. Examples include apples, broccoli, carrots, beans, peas, and barley. Aim to get 25-30 g of fiber per day.  Increase your consumption of legumes, nuts, and seeds to 4-5 servings per week. One serving of dried beans or legumes equals  cup cooked, 1 serving of nuts is  cup, and 1 serving of seeds equals 1 tablespoon. Fats  Choose healthy fats more often. Choose monounsaturated and polyunsaturated fats, such as olive and  canola oils, flaxseeds, walnuts, almonds, and seeds.  Eat more omega-3 fats. Choose salmon, mackerel, sardines, tuna, flaxseed oil, and ground flaxseeds. Aim to eat fish at least 2 times each week.  Check food labels carefully to identify foods with trans fats or high amounts of saturated fat.  Limit saturated fats. These are found in animal products, such as meats, butter, and cream. Plant sources of saturated fats include palm oil, palm kernel oil, and coconut oil.  Avoid foods with partially hydrogenated oils in them. These contain trans fats. Examples are stick margarine, some tub margarines, cookies, crackers, and other baked goods.  Avoid fried foods. General information  Eat more home-cooked food and less restaurant, buffet, and fast food.  Limit or avoid alcohol.  Limit foods that are high in starch and sugar.  Lose weight if you are overweight. Losing just 5-10% of your body weight can help your overall health and prevent diseases such as diabetes and heart disease.  Monitor your salt (sodium) intake, especially if you have high blood pressure. Talk with your health care provider about your sodium intake.  Try to incorporate more vegetarian meals weekly. What foods can I eat? Fruits All fresh, canned (in natural juice), or frozen fruits. Vegetables Fresh or frozen vegetables (raw, steamed, roasted, or grilled). Green salads. Grains Most grains. Choose whole wheat and whole grains most of the time. Rice and pasta, including brown rice and pastas made with whole wheat. Meats and other proteins Lean, well-trimmed beef, veal, pork, and lamb. Chicken and [place] without skin. All fish and  shellfish. Wild duck, rabbit, pheasant, and venison. Egg whites or low-cholesterol egg substitutes. Dried beans, peas, lentils, and tofu. Seeds and most nuts. Dairy Low-fat or nonfat cheeses, including ricotta and mozzarella. Skim or 1% milk (liquid, powdered, or evaporated). Buttermilk made  with low-fat milk. Nonfat or low-fat yogurt. Fats and oils Non-hydrogenated (trans-free) margarines. Vegetable oils, including soybean, sesame, sunflower, olive, peanut, safflower, corn, canola, and cottonseed. Salad dressings or mayonnaise made with a vegetable oil. Beverages Water (mineral or sparkling). Coffee and tea. Diet carbonated beverages. Sweets and desserts Sherbet, gelatin, and fruit ice. Small amounts of dark chocolate. Limit all sweets and desserts. Seasonings and condiments All seasonings and condiments. The items listed above may not be a complete list of foods and beverages you can eat. Contact a dietitian for more options. What foods are not recommended? Fruits Canned fruit in heavy syrup. Fruit in cream or butter sauce. Fried fruit. Limit coconut. Vegetables Vegetables cooked in cheese, cream, or butter sauce. Fried vegetables. Grains Breads made with saturated or trans fats, oils, or whole milk. Croissants. Sweet rolls. Donuts. High-fat crackers, such as cheese crackers. Meats and other proteins Fatty meats, such as hot dogs, ribs, sausage, bacon, rib-eye roast or steak. High-fat deli meats, such as salami and bologna. Caviar. Domestic duck and goose. Organ meats, such as liver. Dairy Cream, sour cream, cream cheese, and creamed cottage cheese. Whole milk cheeses. Whole or 2% milk (liquid, evaporated, or condensed). Whole buttermilk. Cream sauce or high-fat cheese sauce. Whole-milk yogurt. Fats and oils Meat fat, or shortening. Cocoa butter, hydrogenated oils, palm oil, coconut oil, palm kernel oil. Solid fats and shortenings, including bacon fat, salt pork, lard, and butter. Nondairy cream substitutes. Salad dressings with cheese or sour cream. Beverages Regular sodas and any drinks with added sugar. Sweets and desserts Frosting. Pudding. Cookies. Cakes. Pies. Milk chocolate or white chocolate. Buttered syrups. Full-fat ice cream or ice cream drinks. The items listed  above may not be a complete list of foods and beverages to avoid. Contact a dietitian for more information. Summary  Heart-healthy meal planning includes limiting unhealthy fats, increasing healthy fats, and making other diet and lifestyle changes.  Lose weight if you are overweight. Losing just 5-10% of your body weight can help your overall health and prevent diseases such as diabetes and heart disease.  Focus on eating a balance of foods, including fruits and vegetables, low-fat or nonfat dairy, lean protein, nuts and legumes, whole grains, and heart-healthy oils and fats. This information is not intended to replace advice given to you by your health care provider. Make sure you discuss any questions you have with your health care provider. Document Released: 09/01/2008 Document Revised: 12/31/2017 Document Reviewed: 12/31/2017 Elsevier Interactive Patient Education  2019 [employer].

## 2019-02-24 NOTE — Progress Notes (Signed)
Careteam: Patient Care Team: Lauree Chandler, NP as PCP - General (Geriatric Medicine) [name], MD as Consulting Physician (Otolaryngology)  Advanced Directive information    No Known Allergies  Chief Complaint  Patient presents with  . Acute Visit    Long-term cough possibly related to allergies. Patient ran out of inhaler   . Medication Refill    Zoloft and Albuterol inhaler      HPI: Patient is a 78 y.o. female seen in the office today for routine visit.  Cough- Pt reports she chews tobacco about once a week.  She still has a persistent cough that is worse at night  for about 2 years. Cough is non productive, but she clears throat often. She feels she wants to cough stuff out but not able. Cough interrupts sleep. Pt currently takes Lisinopril.   Seasonal allergies-  Pt reports some rhinorrhea and itchy eyes for the past week. Clear discharge from the nose. Pt denies sore throat. She does not take anything OTC. No SOB.  HTN - BP stable on lisinopril. Pt denies headache or dizziness. No chest pain. Pt reports SOB with cough, but it resolves after the cough subsidizes.   Depression - stable on zoloft. She is followed by psych Dr Maple Hudson  Right wrist cyst- Pt noticed it about a week ago. It is tender, firm, smooth, rounded and rubbery to touch.  Hyperlipidemia -Pt had elevated cholesterol in July 2019. Pt eats lots of sausage, pasta and drinks sodas.    Review of Systems:  Review of Systems  Constitutional: Negative for chills, fever, malaise/fatigue and weight loss.  HENT: Negative for congestion, ear discharge, ear pain, sinus pain and sore throat.   Eyes: Negative for pain and discharge.  Respiratory: Positive for cough. Negative for shortness of breath and wheezing.   Cardiovascular: Negative for chest pain, palpitations and leg swelling.  Gastrointestinal: Negative for abdominal pain, constipation, diarrhea, heartburn, nausea and vomiting.   Genitourinary: Positive for frequency.  Skin: Negative for itching and rash.  Neurological: Negative for weakness and headaches.  Psychiatric/Behavioral: Negative for depression.    Past Medical History:  Diagnosis Date  . Breast CA ([place]) 2006   right [facility])  . Cancer ([place])   . Helicobacter pylori gastritis 2001  . High blood pressure   . History of adenomatous polyp of colon 09/23/2017   [place] 09/25/2017 and apparently polyps in the past as well though recalled age  . History of shingles   . Major depressive disorder, recurrent, severe without psychotic features ([place]) 06/24/2015  . Parotid mass 2014   right benign cyst  . Personal history of radiation therapy 2006   Rt breast  . Wears glasses    Past Surgical History:  Procedure Laterality Date  . ABDOMINAL HYSTERECTOMY  1982   BSO  . BREAST LUMPECTOMY WITH SENTINEL LYMPH NODE BIOPSY  2005   right  . BREAST SURGERY  2004   rt br mass  . CESAREAN SECTION    . COLONOSCOPY    . ESOPHAGOGASTRODUODENOSCOPY  2001   H pylori gastritis  . EYE SURGERY     both cataracts  . PAROTIDECTOMY Right 09/05/2013   Procedure: RIGHT SUPERFICIAL PAROTIDECTOMY WITH FACIAL NERVE DISECTION;  Surgeon: [name], MD;  Location: [place];  Service: ENT;  Laterality: Right;  All documentation done by R.Ward after [phone] --done under G. Garzon's password  . RE-EXCISION OF BREAST LUMPECTOMY  2005   right   Social  History:   reports that she has never smoked. She quit smokeless tobacco use about 18 months ago.  Her smokeless tobacco use included chew. She reports current alcohol use. She reports that she does not use drugs.  Family History  Problem Relation Age of Onset  . Heart disease Mother        MI  . Cancer Father        bone  . Thyroid disease Neg Hx   . Hypercalcemia Neg Hx   . Colon cancer Neg Hx   . Stomach cancer Neg Hx   . Rectal cancer Neg Hx   . Esophageal cancer Neg Hx     Medications: Patient's  Medications  New Prescriptions   No medications on file  Previous Medications   ALBUTEROL (VENTOLIN HFA) 108 (90 BASE) MCG/ACT INHALER    Inhale 2 puffs into the lungs every 6 (six) hours as needed for wheezing or shortness of breath.   CALCIUM-VITAMIN D (OSCAL WITH D) 500-200 MG-UNIT TABLET    Take 1 tablet by mouth daily.   LISINOPRIL (PRINIVIL,ZESTRIL) 40 MG TABLET    Take 1 tablet (40 mg total) by mouth daily. Need to schedule an appointment.   SERTRALINE (ZOLOFT) 50 MG TABLET    Take 1 tablet (50 mg total) by mouth daily.  Modified Medications   No medications on file  Discontinued Medications   ESOMEPRAZOLE (NEXIUM) 40 MG CAPSULE    Take 1 capsule (40 mg total) by mouth daily. TAKE FOR 8 WEEKS   PREDNISONE (STERAPRED UNI-PAK 21 TAB) 10 MG (21) TBPK TABLET    6 tabs for 1 day, then 5 tabs for 1 das, then 4 tabs for 1 day, then 3 tabs for 1 day, 2 tabs for 1 day, then 1 tab for 1 day     Physical Exam:  Vitals:   02/24/19 1010  BP: 126/80  Pulse: 81  Temp: 98.4 F (36.9 C)  TempSrc: Oral  SpO2: 96%  Weight: 169 lb (76.7 kg)  Height: 5\' 3"  (1.6 m)   Body mass index is 29.94 kg/m.  Physical Exam Vitals signs reviewed.  Constitutional:      General: She is not in acute distress.    Appearance: Normal appearance. She is normal weight. She is not ill-appearing or diaphoretic.       Comments: Round, rubbery, tender  HENT:     Head: Normocephalic and atraumatic.     Right Ear: Tympanic membrane, ear canal and external ear normal.     Left Ear: Tympanic membrane, ear canal and external ear normal.     Nose: Rhinorrhea present.     Mouth/Throat:     Mouth: Mucous membranes are moist.     Pharynx: Oropharynx is clear.  Neck:     Musculoskeletal: Muscular tenderness present.  Cardiovascular:     Rate and Rhythm: Normal rate and regular rhythm.     Pulses: Normal pulses.     Heart sounds: Normal heart sounds. No murmur. No gallop.   Pulmonary:     Effort: Pulmonary  effort is normal. No respiratory distress.     Breath sounds: Normal breath sounds. No wheezing, rhonchi or rales.  Chest:     Chest wall: No tenderness.  Abdominal:     General: Bowel sounds are normal.     Palpations: Abdomen is soft.     Tenderness: There is no abdominal tenderness.  Musculoskeletal:        General: No swelling.  Right lower leg: No edema.     Left lower leg: No edema.  Skin:    General: Skin is warm and dry.  Neurological:     General: No focal deficit present.     Mental Status: She is alert and oriented to person, place, and time.  Psychiatric:        Mood and Affect: Mood normal.        Thought Content: Thought content normal.     Labs reviewed: Basic Metabolic Panel: Recent Labs    02/28/18 1633 06/13/18 0910  NA 138 141  K 4.3 4.2  CL 103 109  CO2 27 23  GLUCOSE 93 109*  BUN 10 15  CREATININE 0.81 0.86  CALCIUM 10.5* 10.3  TSH  --  1.09   Liver Function Tests: Recent Labs    06/13/18 0910  AST 13  ALT 8  BILITOT 0.6  PROT 6.7   No results for input(s): LIPASE, AMYLASE in the last 8760 hours. No results for input(s): AMMONIA in the last 8760 hours. CBC: Recent Labs    02/28/18 1633 06/13/18 0910  WBC 7.8 5.1  NEUTROABS 4,797 2,530  HGB 13.7 13.5  HCT 41.3 40.7  MCV 85.2 85.5  PLT 287 303   Lipid Panel: Recent Labs    06/13/18 0910  CHOL 220*  HDL 50*  LDLCALC 149*  TRIG 98  CHOLHDL 4.4   TSH: Recent Labs    06/13/18 0910  TSH 1.09   A1C: No results found for: HGBA1C   Assessment/Plan  1. Essential hypertension, benign Stable condition. Take medication as prescribed. However due to cough, will stop lisinopril and start losartan.  - losartan (COZAAR) 100 MG tablet; Take 1 tablet (100 mg total) by mouth daily.  Dispense: 90 tablet; Refill: 3  3. Allergic rhinitis, unspecified seasonality, unspecified trigger Pt report seasonal allergies symptom. Pt doesn't use Albuterol often.  - albuterol (VENTOLIN  HFA) 108 (90 Base) MCG/ACT inhaler; Inhale 2 puffs into the lungs every 6 (six) hours as needed for wheezing or shortness of breath.  Dispense: 6.7 g; Refill: 2  4. Cough Pt reports nonproductive cough for about 2 years. Lisinopril discontinued and Losartan started. Pt is educated to check her blood pressure 3 times a week and record it. Pt is scheduled to be back in 2 weeks.   5. Hyperglycemia Elevated blood sugar on previous lab draw 109 on 06/13/2018 - Hemoglobin A1c  6. Mixed hyperlipidemia Increased cholesterol level (220) LDL (149) in 06/13/2018. Repeating labs today. Pt is educated about low fat diet/heart healthy diet. Pt has increased cholesterol and LDL level from previous labs 06/13/2018. Pt is educated on heart healthy diet   - Lipid Panel - COMPLETE METABOLIC PANEL WITH GFR  7. Major depressive disorder, recurrent severe without psychotic features ([place]) Ongoing condition. Pt reports Zoloft helps her with depression and does not have any side effects. - sertraline (ZOLOFT) 50 MG tablet; Take 1 tablet (50 mg total) by mouth daily.  Dispense: 90 tablet; Refill: 0  8. 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 Pt strongly encouraged to stop it or minimize the use of it.   9. Ganglion cyst of wrist, right Pt reports tender cyst on the right inner wrist. Pt is educated to wear wrist brace to reduce movement and pain. Pt is educated to report back if the cyst gets bigger, inflamed, warm to touch, painful and have some discharge.    Next appt:  2 weeks for blood pressure check and 6 months for routine follow up Victor. Harle Battiest I personally was present during the history, physical exam and medical decision-making activities of this service and have verified that the service and findings are accurately documented in the student's note   [facility] [phone]

## 2019-02-25 LAB — HEMOGLOBIN A1C
Hgb A1c MFr Bld: 5.7 % of total Hgb — ABNORMAL HIGH (ref <5.7)
MEAN PLASMA GLUCOSE: 117 (calc)
eAG (mmol/L): 6.5 (calc)

## 2019-02-25 LAB — COMPLETE METABOLIC PANEL WITH GFR
AG Ratio: 1.4 (calc) (ref 1.0–2.5)
ALT: 8 U/L (ref 6–29)
AST: 14 U/L (ref 10–35)
Albumin: 3.9 g/dL (ref 3.6–5.1)
Alkaline phosphatase (APISO): 108 U/L (ref 37–153)
BUN: 15 mg/dL (ref 7–25)
CO2: 25 mmol/L (ref 20–32)
Calcium: 10.5 mg/dL — ABNORMAL HIGH (ref 8.6–10.4)
Chloride: 107 mmol/L (ref 98–110)
Creat: 0.9 mg/dL (ref 0.60–0.93)
GFR, Est African American: 71 mL/min/{1.73_m2} (ref >=60)
GFR, Est Non African American: 62 mL/min/{1.73_m2} (ref >=60)
Globulin: 2.8 g/dL (calc) (ref 1.9–3.7)
Glucose, Bld: 71 mg/dL (ref 65–99)
Potassium: 4.7 mmol/L (ref 3.5–5.3)
Sodium: 140 mmol/L (ref 135–146)
Total Bilirubin: 0.5 mg/dL (ref 0.2–1.2)
Total Protein: 6.7 g/dL (ref 6.1–8.1)

## 2019-02-25 LAB — LIPID PANEL
Cholesterol: 191 mg/dL (ref <200)
HDL: 49 mg/dL — ABNORMAL LOW (ref >=50)
LDL Cholesterol (Calc): 121 mg/dL (calc) — ABNORMAL HIGH
Non-HDL Cholesterol (Calc): 142 mg/dL (calc) — ABNORMAL HIGH (ref <130)
Total CHOL/HDL Ratio: 3.9 (calc) (ref <5.0)
Triglycerides: 104 mg/dL (ref <150)

## 2019-03-13 ENCOUNTER — Telehealth: Payer: Self-pay | Admitting: *Deleted

## 2019-03-13 NOTE — Telephone Encounter (Signed)
Noted  

## 2019-03-13 NOTE — Telephone Encounter (Signed)
Patient called and stated that she is itching from head to toe since changing her blood pressure medication. Uses alcohol when starts itching. Starts itching in the middle of the back and down into arms.   Patient called c/o rash  1. Where is the rash located? No visible rash. Itching starts in the middle of her back and works down her arms.   2. When was the onset? Since changing BP medications. But patient stated that it is working well and she has only coughed 1 time.   3. Is there pain associated with rash? No  4. Does the rash itch? Yes  5. Any new medications? BP medication change  6. Any new daily routine items like shampoo, soaps, detergent, makeup, lotion, ect? Yes, she switches her body wash all the time. Stated that could be a problem that she hasn't thought of.   7. What have you tried OTC or done on your own for treatment? Rubbing Alcohol.   Patient has a Public affairs consultant and stated that she will discuss then.

## 2019-03-13 NOTE — Telephone Encounter (Signed)
Per Janett Billow call patient to see if she will consent to a Tele-visit today vs waiting until tomorrow.  Left message on voicemail for patient to return call when available

## 2019-03-14 ENCOUNTER — Ambulatory Visit (INDEPENDENT_AMBULATORY_CARE_PROVIDER_SITE_OTHER): Payer: BC Managed Care – PPO | Admitting: Nurse Practitioner

## 2019-03-14 ENCOUNTER — Encounter: Payer: Self-pay | Admitting: Nurse Practitioner

## 2019-03-14 ENCOUNTER — Ambulatory Visit: Payer: BC Managed Care – PPO | Admitting: Family

## 2019-03-14 ENCOUNTER — Other Ambulatory Visit: Payer: Self-pay

## 2019-03-14 DIAGNOSIS — R05 Cough: Secondary | ICD-10-CM

## 2019-03-14 DIAGNOSIS — R059 Cough, unspecified: Secondary | ICD-10-CM

## 2019-03-14 DIAGNOSIS — R21 Rash and other nonspecific skin eruption: Secondary | ICD-10-CM

## 2019-03-14 DIAGNOSIS — I10 Essential (primary) hypertension: Secondary | ICD-10-CM | POA: Diagnosis not present

## 2019-03-14 NOTE — Progress Notes (Signed)
This service is provided via telemedicine  No vital signs collected/recorded due to the encounter was a telemedicine visit.   Location of patient (ex: home, work):  Home  Patient consents to a telephone visit:  Yes  Location of the provider (ex: office, home): [employer], Office   Name of any referring provider:  Ngetich, Nelda Bucks, NP  Names of all persons participating in the telemedicine service and their role in the encounter: S.Chrae B/CMA, Sherrie Mustache, NP, and Patient   Time spent on call:  4 min with medical assistant   Virtual Visit via Telephone Note  I connected with Erin Cunningham on 03/14/19 at  9:00 AM EDT by telephone and verified that I am speaking with the correct person using two identifiers.   I discussed the limitations, risks, security and privacy concerns of performing an evaluation and management service by telephone and the availability of in person appointments. I also discussed with the patient that there may be a patient responsible charge related to this service. The patient expressed understanding and agreed to proceed.      Careteam: Patient Care Team: Ngetich, Nelda Bucks, NP as PCP - General (Family Medicine) [name], MD as Consulting Physician (Otolaryngology)  Advanced Directive information    No Known Allergies  Chief Complaint  Patient presents with  . Follow-up    2 week follow-up on cough and blood pressure medication. Patient had coughing spell this am.Tele-Visit   . Rash    Patient c/o rash, refer to telephone encounter from 03/13/2019  . Medication Management    Patient was unable to pivk-up inhaler due to cost      HPI: Patient is a 78 y.o. female to follow up cough.  At last visit lisinopril was stopped and she was changed to losartan and she reports her cough has improved 110%. Had a coughing spell this morning but otherwise it is much better.   Reports she has had more itching episodes since she started  taking losartan. Reports small raised red areas that come and go. Last occurred 2 days ago, none currently. Puts alcohol on areas and they improve. Happened on her back. Reports she had had this before she started the medication but feels like it has been happening more often. Also states she changed body wash at the same time. Has had a chronic itch on her back since she had shingle. No other spots on her body.    113/95  on home cuff. Took medication last night.   Denies headache, shortness of breath or chest pain.  Feels great.    Review of Systems:  Review of Systems  Constitutional: Negative for chills, fever, malaise/fatigue and weight loss.  Respiratory: Negative for cough, sputum production and shortness of breath.   Cardiovascular: Negative for chest pain.  Skin: Positive for rash (comes and goes to back, none currently).  Neurological: Negative for dizziness, weakness and headaches.    Past Medical History:  Diagnosis Date  . Breast CA ([place]) 2006   right [facility])  . Cancer ([place])   . Helicobacter pylori gastritis 2001  . High blood pressure   . History of adenomatous polyp of colon 09/23/2017   [place] 09/25/2017 and apparently polyps in the past as well though recalled age  . History of shingles   . Major depressive disorder, recurrent, severe without psychotic features ([place]) 06/24/2015  . Parotid mass 2014   right benign cyst  . Personal history of radiation therapy 2006  Rt breast  . Wears glasses    Past Surgical History:  Procedure Laterality Date  . ABDOMINAL HYSTERECTOMY  1982   BSO  . BREAST LUMPECTOMY WITH SENTINEL LYMPH NODE BIOPSY  2005   right  . BREAST SURGERY  2004   rt br mass  . CESAREAN SECTION    . COLONOSCOPY    . ESOPHAGOGASTRODUODENOSCOPY  2001   H pylori gastritis  . EYE SURGERY     both cataracts  . PAROTIDECTOMY Right 09/05/2013   Procedure: RIGHT SUPERFICIAL PAROTIDECTOMY WITH FACIAL NERVE DISECTION;  Surgeon: [name], MD;   Location: [place];  Service: ENT;  Laterality: Right;  All documentation done by R.Ward after [phone] --done under G. Garzon's password  . RE-EXCISION OF BREAST LUMPECTOMY  2005   right   Social History:   reports that she has never smoked. She quit smokeless tobacco use about 18 months ago.  Her smokeless tobacco use included chew. She reports current alcohol use. She reports that she does not use drugs.  Family History  Problem Relation Age of Onset  . Heart disease Mother        MI  . Cancer Father        bone  . Thyroid disease Neg Hx   . Hypercalcemia Neg Hx   . Colon cancer Neg Hx   . Stomach cancer Neg Hx   . Rectal cancer Neg Hx   . Esophageal cancer Neg Hx     Medications: Patient's Medications  New Prescriptions   No medications on file  Previous Medications   ALBUTEROL (VENTOLIN HFA) 108 (90 BASE) MCG/ACT INHALER    Inhale 2 puffs into the lungs every 6 (six) hours as needed for wheezing or shortness of breath.   CALCIUM-VITAMIN D (OSCAL WITH D) 500-200 MG-UNIT TABLET    Take 1 tablet by mouth daily.   LOSARTAN (COZAAR) 100 MG TABLET    Take 1 tablet (100 mg total) by mouth daily.   SERTRALINE (ZOLOFT) 50 MG TABLET    Take 1 tablet (50 mg total) by mouth daily.  Modified Medications   No medications on file  Discontinued Medications   No medications on file     Physical Exam: unable due to tele-visit   Labs reviewed: Basic Metabolic Panel: Recent Labs    06/13/18 0910 02/24/19 1113  NA 141 140  K 4.2 4.7  CL 109 107  CO2 23 25  GLUCOSE 109* 71  BUN 15 15  CREATININE 0.86 0.90  CALCIUM 10.3 10.5*  TSH 1.09  --    Liver Function Tests: Recent Labs    06/13/18 0910 02/24/19 1113  AST 13 14  ALT 8 8  BILITOT 0.6 0.5  PROT 6.7 6.7   No results for input(s): LIPASE, AMYLASE in the last 8760 hours. No results for input(s): AMMONIA in the last 8760 hours. CBC: Recent Labs    06/13/18 0910  WBC 5.1  NEUTROABS 2,530  HGB 13.5   HCT 40.7  MCV 85.5  PLT 303   Lipid Panel: Recent Labs    06/13/18 0910 02/24/19 1113  CHOL 220* 191  HDL 50* 49*  LDLCALC 149* 121*  TRIG 98 104  CHOLHDL 4.4 3.9   TSH: Recent Labs    06/13/18 0910  TSH 1.09   A1C: Lab Results  Component Value Date   HGBA1C 5.7 (H) 02/24/2019     Assessment/Plan 1. Essential hypertension, benign -stable on losartan, to continue to follow  blood pressure at home. DASH diet recommended and to continue losartan 100 mg by mouth daily.   2. Cough -improved since stopping lisinipril  3. Rash Does not appear to be related to losartan since she has having prior to starting. Encouraged to montior and if needed to back to old body wash since she recently changed. If area reoccurs/worsens to notify    Next appt: 08/15/2019 Janett Billow K. Harle Battiest  [facility] [phone]    Follow Up Instructions:    I discussed the assessment and treatment plan with the patient. The patient was provided an opportunity to ask questions and all were answered. The patient agreed with the plan and demonstrated an understanding of the instructions.   The patient was advised to call back or seek an in-person evaluation if the symptoms worsen or if the condition fails to improve as anticipated.  I provided 15 minutes of non-face-to-face time during this encounter.   Lauree Chandler, NP avs printed and mailed.

## 2019-03-14 NOTE — Patient Instructions (Addendum)
To continue to monitor blood pressure after you have been sitting ~5 mins and make sure you have taken blood pressure GOAL <140/90  DASH Eating Plan DASH stands for "Dietary Approaches to Stop Hypertension." The DASH eating plan is a healthy eating plan that has been shown to reduce high blood pressure (hypertension). It may also reduce your risk for type 2 diabetes, heart disease, and stroke. The DASH eating plan may also help with weight loss. What are tips for following this plan?  General guidelines  Avoid eating more than 2,300 mg (milligrams) of salt (sodium) a day. If you have hypertension, you may need to reduce your sodium intake to 1,500 mg a day.  Limit alcohol intake to no more than 1 drink a day for nonpregnant women and 2 drinks a day for men. One drink equals 12 oz of beer, 5 oz of wine, or 1 oz of hard liquor.  Work with your health care provider to maintain a healthy body weight or to lose weight. Ask what an ideal weight is for you.  Get at least 30 minutes of exercise that causes your heart to beat faster (aerobic exercise) most days of the week. Activities may include walking, swimming, or biking.  Work with your health care provider or diet and nutrition specialist (dietitian) to adjust your eating plan to your individual calorie needs. Reading food labels   Check food labels for the amount of sodium per serving. Choose foods with less than 5 percent of the Daily Value of sodium. Generally, foods with less than 300 mg of sodium per serving fit into this eating plan.  To find whole grains, look for the word "whole" as the first word in the ingredient list. Shopping  Buy products labeled as "low-sodium" or "no salt added."  Buy fresh foods. Avoid canned foods and premade or frozen meals. Cooking  Avoid adding salt when cooking. Use salt-free seasonings or herbs instead of table salt or sea salt. Check with your health care provider or pharmacist before using salt  substitutes.  Do not fry foods. Cook foods using healthy methods such as baking, boiling, grilling, and broiling instead.  Cook with heart-healthy oils, such as olive, canola, soybean, or sunflower oil. Meal planning  Eat a balanced diet that includes: ? 5 or more servings of fruits and vegetables each day. At each meal, try to fill half of your plate with fruits and vegetables. ? Up to 6-8 servings of whole grains each day. ? Less than 6 oz of lean meat, poultry, or fish each day. A 3-oz serving of meat is about the same size as a deck of cards. One egg equals 1 oz. ? 2 servings of low-fat dairy each day. ? A serving of nuts, seeds, or beans 5 times each week. ? Heart-healthy fats. Healthy fats called Omega-3 fatty acids are found in foods such as flaxseeds and coldwater fish, like sardines, salmon, and mackerel.  Limit how much you eat of the following: ? Canned or prepackaged foods. ? Food that is high in trans fat, such as fried foods. ? Food that is high in saturated fat, such as fatty meat. ? Sweets, desserts, sugary drinks, and other foods with added sugar. ? Full-fat dairy products.  Do not salt foods before eating.  Try to eat at least 2 vegetarian meals each week.  Eat more home-cooked food and less restaurant, buffet, and fast food.  When eating at a restaurant, ask that your food be prepared with less  salt or no salt, if possible. What foods are recommended? The items listed may not be a complete list. Talk with your dietitian about what dietary choices are best for you. Grains Whole-grain or whole-wheat bread. Whole-grain or whole-wheat pasta. Brown rice. [place] Morrow. Bulgur. Whole-grain and low-sodium cereals. Pita bread. Low-fat, low-sodium crackers. Whole-wheat flour tortillas. Vegetables Fresh or frozen vegetables (raw, steamed, roasted, or grilled). Low-sodium or reduced-sodium tomato and vegetable juice. Low-sodium or reduced-sodium tomato sauce and tomato  paste. Low-sodium or reduced-sodium canned vegetables. Fruits All fresh, dried, or frozen fruit. Canned fruit in natural juice (without added sugar). Meat and other protein foods Skinless chicken or [place]. Ground chicken or [place]. Pork with fat trimmed off. Fish and seafood. Egg whites. Dried beans, peas, or lentils. Unsalted nuts, nut butters, and seeds. Unsalted canned beans. Lean cuts of beef with fat trimmed off. Low-sodium, lean deli meat. Dairy Low-fat (1%) or fat-free (skim) milk. Fat-free, low-fat, or reduced-fat cheeses. Nonfat, low-sodium ricotta or cottage cheese. Low-fat or nonfat yogurt. Low-fat, low-sodium cheese. Fats and oils Soft margarine without trans fats. Vegetable oil. Low-fat, reduced-fat, or light mayonnaise and salad dressings (reduced-sodium). Canola, safflower, olive, soybean, and sunflower oils. Avocado. Seasoning and other foods Herbs. Spices. Seasoning mixes without salt. Unsalted popcorn and pretzels. Fat-free sweets. What foods are not recommended? The items listed may not be a complete list. Talk with your dietitian about what dietary choices are best for you. Grains Baked goods made with fat, such as croissants, muffins, or some breads. Dry pasta or rice meal packs. Vegetables Creamed or fried vegetables. Vegetables in a cheese sauce. Regular canned vegetables (not low-sodium or reduced-sodium). Regular canned tomato sauce and paste (not low-sodium or reduced-sodium). Regular tomato and vegetable juice (not low-sodium or reduced-sodium). Angie Fava. Olives. Fruits Canned fruit in a light or heavy syrup. Fried fruit. Fruit in cream or butter sauce. Meat and other protein foods Fatty cuts of meat. Ribs. Fried meat. Berniece Salines. Sausage. Bologna and other processed lunch meats. Salami. Fatback. Hotdogs. Bratwurst. Salted nuts and seeds. Canned beans with added salt. Canned or smoked fish. Whole eggs or egg yolks. Chicken or [place] with skin. Dairy Whole or 2% milk,  cream, and half-and-half. Whole or full-fat cream cheese. Whole-fat or sweetened yogurt. Full-fat cheese. Nondairy creamers. Whipped toppings. Processed cheese and cheese spreads. Fats and oils Butter. Stick margarine. Lard. Shortening. Ghee. Bacon fat. Tropical oils, such as coconut, palm kernel, or palm oil. Seasoning and other foods Salted popcorn and pretzels. Onion salt, garlic salt, seasoned salt, table salt, and sea salt. Worcestershire sauce. Tartar sauce. Barbecue sauce. Teriyaki sauce. Soy sauce, including reduced-sodium. Steak sauce. Canned and packaged gravies. Fish sauce. Oyster sauce. Cocktail sauce. Horseradish that you find on the shelf. Ketchup. Mustard. Meat flavorings and tenderizers. Bouillon cubes. Hot sauce and Tabasco sauce. Premade or packaged marinades. Premade or packaged taco seasonings. Relishes. Regular salad dressings. Where to find more information:  National Heart, Lung, and [place]: https://wilson-eaton.com/  American Heart Association: www.heart.org Summary  The DASH eating plan is a healthy eating plan that has been shown to reduce high blood pressure (hypertension). It may also reduce your risk for type 2 diabetes, heart disease, and stroke.  With the DASH eating plan, you should limit salt (sodium) intake to 2,300 mg a day. If you have hypertension, you may need to reduce your sodium intake to 1,500 mg a day.  When on the DASH eating plan, aim to eat more fresh fruits and vegetables, whole grains, lean proteins, low-fat  dairy, and heart-healthy fats.  Work with your health care provider or diet and nutrition specialist (dietitian) to adjust your eating plan to your individual calorie needs. This information is not intended to replace advice given to you by your health care provider. Make sure you discuss any questions you have with your health care provider. Document Released: 11/12/2011 Document Revised: 11/16/2016 Document Reviewed: 11/16/2016 Elsevier  Interactive Patient Education  2019 [employer].

## 2019-03-14 NOTE — Telephone Encounter (Signed)
Patient never returned call. Tele-visit completed today

## 2019-03-16 ENCOUNTER — Other Ambulatory Visit: Payer: Self-pay

## 2019-03-16 ENCOUNTER — Ambulatory Visit (INDEPENDENT_AMBULATORY_CARE_PROVIDER_SITE_OTHER): Payer: Self-pay | Admitting: Psychiatry

## 2019-03-16 DIAGNOSIS — F411 Generalized anxiety disorder: Secondary | ICD-10-CM

## 2019-03-16 DIAGNOSIS — F332 Major depressive disorder, recurrent severe without psychotic features: Secondary | ICD-10-CM

## 2019-03-16 MED ORDER — SERTRALINE HCL 50 MG PO TABS: 50.0000 mg | ORAL_TABLET | Freq: Every day | ORAL | 0 refills | Status: DC

## 2019-03-16 NOTE — Progress Notes (Signed)
Virtual Visit via Telephone Note  I connected with Erin Cunningham on 03/16/19 at  2:20 PM EDT by telephone and verified that I am speaking with the correct person using two identifiers.   I discussed the limitations, risks, security and privacy concerns of performing an evaluation and management service by telephone and the availability of in person appointments. I also discussed with the patient that there may be a patient responsible charge related to this service. The patient expressed understanding and agreed to proceed.   History of Present Illness: Patient was evaluated through phone session.  She is taking Zoloft 50 mg which was prescribed by primary care physician.  She has not seen me since June.  She apologized missing appointment but described her depression is a stable.  She denies any irritability, anger, crying spells.  She still have issues with her husband but now she cannot stand for herself.  She is pleased as she is very involved in her grandkids and she has 1 grandchild who is staying with her.  Patient told she is a joy in her life.  Patient reported no side effects medication.  She has recently blood work and her hemoglobin A1c is 5.7.  She reported no tremors shakes or any EPS.  She reported her appetite is okay and she is trying to cut down her soda to lose weight.  Patient denies drinking or using any illegal substances.  Past Psychiatric History: Reviewed H/O depression and anxiety. One inpatient at [facility] for suicidal thoughts. H/O brief admission at OBS unit in 2016. No h/o mania, psychosis, hallucination or suicidal attempt.   Observations/Objective: Limited mental status examination done on the phone.  Patient describes her mood is good.  Her speech is clear, coherent and there were no flight of ideas or any loose association.  Her attention and concentration is okay.  She denies any auditory or visual hallucination.  She denies any active or passive suicidal  thoughts or homicidal thought.  There were no delusions or paranoia.  Her attention and concentration is okay.  She is alert and oriented x3.  Her fund of knowledge is adequate.  Her cognition is intact.  Her insight judgment is okay.  Assessment and Plan: Major depressive disorder, recurrent.  Generalized anxiety disorder.  I reviewed her current medication, blood work results.  Patient is a stable on Zoloft 50 mg daily.  She is not interested in therapy.  Discussed current pandemic coronavirus situation.  Discussed taking precautions when leaving outside.  Patient does not want to change medication.  I will continue Zoloft 50 mg which is helping her anxiety and depression.  Recommended to call us back if she has any question or any concern.  Follow-up in 3 months.  Follow Up Instructions:    I discussed the assessment and treatment plan with the patient. The patient was provided an opportunity to ask questions and all were answered. The patient agreed with the plan and demonstrated an understanding of the instructions.   The patient was advised to call back or seek an in-person evaluation if the symptoms worsen or if the condition fails to improve as anticipated.  I provided 20 minutes of non-face-to-face time during this encounter.   [name], MD

## 2019-04-02 ENCOUNTER — Encounter: Payer: Self-pay | Admitting: Family

## 2019-06-07 ENCOUNTER — Other Ambulatory Visit: Payer: Self-pay | Admitting: Internal Medicine

## 2019-06-12 ENCOUNTER — Other Ambulatory Visit: Payer: Self-pay | Admitting: Internal Medicine

## 2019-06-12 ENCOUNTER — Other Ambulatory Visit: Payer: Self-pay | Admitting: Nurse Practitioner

## 2019-06-12 DIAGNOSIS — Z1231 Encounter for screening mammogram for malignant neoplasm of breast: Secondary | ICD-10-CM

## 2019-06-15 ENCOUNTER — Encounter (HOSPITAL_COMMUNITY): Payer: Self-pay | Admitting: Psychiatry

## 2019-06-15 ENCOUNTER — Ambulatory Visit (INDEPENDENT_AMBULATORY_CARE_PROVIDER_SITE_OTHER): Payer: Self-pay | Admitting: Psychiatry

## 2019-06-15 ENCOUNTER — Other Ambulatory Visit: Payer: Self-pay

## 2019-06-15 DIAGNOSIS — F411 Generalized anxiety disorder: Secondary | ICD-10-CM

## 2019-06-15 DIAGNOSIS — F332 Major depressive disorder, recurrent severe without psychotic features: Secondary | ICD-10-CM

## 2019-06-15 MED ORDER — SERTRALINE HCL 50 MG PO TABS: 50.0000 mg | ORAL_TABLET | Freq: Every day | ORAL | 0 refills | Status: DC

## 2019-06-15 NOTE — Progress Notes (Signed)
Virtual Visit via Telephone Note  I connected with Erin Cunningham on 06/15/19 at  1:20 PM EDT by telephone and verified that I am speaking with the correct person using two identifiers.   I discussed the limitations, risks, security and privacy concerns of performing an evaluation and management service by telephone and the availability of in person appointments. I also discussed with the patient that there may be a patient responsible charge related to this service. The patient expressed understanding and agreed to proceed.   History of Present Illness: Patient was evaluated by phone session.  She is taking her medication and reported that her depression and anxiety is much better.  She is taking Zoloft 50 mg daily.  She is sleeping good.  She still have issues with her husband but she is really handle very well.  She does not ask money from him so she is working.  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  She takes a lot of precaution when she goes outside due to COVID-19.  She denies any tremors, shakes or any EPS.  She denies any panic attack or any crying spells.  She denies any feeling of hopelessness or worthlessness.  She has upcoming appointment in September to see her primary care physician for blood work and annual checkup.  She has no tremors shakes or any EPS.  She wants to continue Zoloft 50 mg daily.  She is hoping to start working in August when school starts.  Patient denies drinking or using any illegal substances. Past Psychiatric History:Reviewed H/O depression and anxiety. One inpatient at [facility] for suicidal thoughts. H/O brief admission at OBS unit in 2016. No h/o mania, psychosis, hallucination or suicidal attempt.   Psychiatric Specialty Exam: Physical Exam  ROS  There were no vitals taken for this visit.There is no height or weight on file to calculate BMI.  General Appearance: NA  Eye Contact:  NA  Speech:  Clear and Coherent and Normal Rate  Volume:  Normal   Mood:  Euthymic  Affect:  NA  Thought Process:  Goal Directed  Orientation:  Full (Time, Place, and Person)  Thought Content:  WDL and Logical  Suicidal Thoughts:  No  Homicidal Thoughts:  No  Memory:  Immediate;   Good Recent;   Good Remote;   Good  Judgement:  Good  Insight:  Good  Psychomotor Activity:  NA  Concentration:  Concentration: Good and Attention Span: Good  Recall:  Good  Fund of Knowledge:  Good  Language:  Good  Akathisia:  No  Handed:  Right  AIMS (if indicated):     Assets:  Communication Skills Desire for Improvement Housing Resilience Social Support Talents/Skills  ADL's:  Intact  Cognition:  WNL  Sleep:   good      Assessment and Plan: Major depressive disorder, recurrent.  Generalized anxiety disorder.  Patient is a stable on her current medication.  She like to continue Zoloft 50 mg daily.  She is not interested in therapy.  Discussed medication side effects and benefits.  Recommended to call us back if there is any question or any concern.  Follow-up in 3 months.  Follow Up Instructions:    I discussed the assessment and treatment plan with the patient. The patient was provided an opportunity to ask questions and all were answered. The patient agreed with the plan and demonstrated an understanding of the instructions.   The patient was advised to call back or seek an in-person evaluation  if the symptoms worsen or if the condition fails to improve as anticipated.  I provided 15 minutes of non-face-to-face time during this encounter.   [name], MD

## 2019-07-10 ENCOUNTER — Ambulatory Visit (INDEPENDENT_AMBULATORY_CARE_PROVIDER_SITE_OTHER): Payer: BC Managed Care – PPO | Admitting: Family

## 2019-07-10 ENCOUNTER — Other Ambulatory Visit: Payer: Self-pay

## 2019-07-10 ENCOUNTER — Encounter: Payer: Self-pay | Admitting: Family

## 2019-07-10 DIAGNOSIS — J309 Allergic rhinitis, unspecified: Secondary | ICD-10-CM

## 2019-07-10 DIAGNOSIS — R05 Cough: Secondary | ICD-10-CM

## 2019-07-10 DIAGNOSIS — R053 Chronic cough: Secondary | ICD-10-CM

## 2019-07-10 NOTE — Progress Notes (Signed)
This service is provided via telemedicine  No vital signs collected/recorded due to the encounter was a telemedicine visit.   Location of patient (ex: home, work):  home  Patient consents to a telephone visit:  Yes  Location of the provider (ex: office, home): office   Name of any referring provider:  N/A  Names of all persons participating in the telemedicine service and their role in the encounter:  Marlowe Sax, NP, Ruthell Rummage CMA, Autumn Messing   Time spent on call: Ruthell Rummage CMA, spent 7  Minutes on phone with patient    Provider:   FNP-C  , Nelda Bucks, NP  Patient Care Team: , Nelda Bucks, NP as PCP - General (Family Medicine) [name], MD as Consulting Physician (Otolaryngology)  Extended Emergency Contact Information Primary Emergency Contact: Bradt,[name] Address: [address]          [place], [place] [address] [name] of [place] Phone: [phone] Mobile Phone: [phone] Relation: Spouse  Code Status: Full Code  Goals of care: Advanced Directive information Advanced Directives 09/15/2017  Does Patient Have a Medical Advance Directive? No  Would patient like information on creating a medical advance directive? -  Some encounter information is confidential and restricted. Go to Review Flowsheets activity to see all data.     Chief Complaint  Patient presents with  . Acute Visit    Patient states she has cough but it has been going on for a year or 2 but was told it was due to allergy, Patient states she gets out of breath and headache. Patient says it comes and goes. She tried to treat with drinking water. Patient states need note for work for her not to be around anyone for first 9 weeks.      HPI:  Pt is a 78 y.o. female seen today for an acute visit for evaluation of cough.she states has had on going cough a year or 2 but was told it was due to allergy.Patient states cough comes and goes.she  reports shortness of breath and headache due to cough.She tried to treat with drinking water.she was was previously treated with Albuterol by PCP Dr.Carter.Patient states need note for work for her not to be around anyone for first 9 weeks.she states fearful due to her advance age,history of breast cancer and her chronic cough.she denies any exposure to person sick with COVID-19.she states wears mask and practices social distancing whenever she is out of her house.      Past Medical History:  Diagnosis Date  . Breast CA ([place]) 2006   right [facility])  . Cancer ([place])   . Helicobacter pylori gastritis 2001  . High blood pressure   . History of adenomatous polyp of colon 09/23/2017   Oma 09/25/2017 and apparently polyps in the past as well though recalled age  . History of shingles   . Major depressive disorder, recurrent, severe without psychotic features ([place]) 06/24/2015  . Parotid mass 2014   right benign cyst  . Personal history of radiation therapy 2006   Rt breast  . Wears glasses    Past Surgical History:  Procedure Laterality Date  . ABDOMINAL HYSTERECTOMY  1982   BSO  . BREAST LUMPECTOMY WITH SENTINEL LYMPH NODE BIOPSY  2005   right  . BREAST SURGERY  2004   rt br mass  . CESAREAN SECTION    . COLONOSCOPY    . ESOPHAGOGASTRODUODENOSCOPY  2001   H pylori gastritis  . EYE SURGERY  both cataracts  . PAROTIDECTOMY Right 09/05/2013   Procedure: RIGHT SUPERFICIAL PAROTIDECTOMY WITH FACIAL NERVE DISECTION;  Surgeon: [name], MD;  Location: [place];  Service: ENT;  Laterality: Right;  All documentation done by R.Ward after [phone] --done under G. Garzon's password  . RE-EXCISION OF BREAST LUMPECTOMY  2005   right    No Known Allergies  Outpatient Encounter Medications as of 07/10/2019  Medication Sig  . albuterol (VENTOLIN HFA) 108 (90 Base) MCG/ACT inhaler Inhale 2 puffs into the lungs every 6 (six) hours as needed for wheezing or shortness of  breath.  . losartan (COZAAR) 100 MG tablet Take 1 tablet (100 mg total) by mouth daily.  . Multiple Vitamins-Minerals (CENTRUM MULTIGUMMIES) CHEW Chew 2 tablets by mouth daily.  . sertraline (ZOLOFT) 50 MG tablet Take 1 tablet (50 mg total) by mouth daily.  . [DISCONTINUED] calcium-vitamin D (OSCAL WITH D) 500-200 MG-UNIT tablet Take 1 tablet by mouth daily.   No facility-administered encounter medications on file as of 07/10/2019.     Review of Systems  Constitutional: Negative for appetite change, chills, fatigue and fever.  HENT: Positive for rhinorrhea. Negative for congestion, sinus pressure, sinus pain, sneezing and sore throat.   Eyes: Negative for discharge, redness and itching.       Chronic eye redness   Respiratory: Positive for cough. Negative for chest tightness and wheezing.        Non productive dry cough intermittent.shortness of breath with exertion.  Cardiovascular: Negative for chest pain, palpitations and leg swelling.  Gastrointestinal: Negative for abdominal distention, abdominal pain, constipation, diarrhea, nausea and vomiting.  Skin: Negative for color change, pallor and rash.  Neurological: Negative for dizziness and light-headedness.       Headache with cough     Immunization History  Administered Date(s) Administered  . Influenza,inj,Quad PF,6+ Mos 11/06/2015  . PPD Test 12/10/2015  . Pneumococcal Conjugate-13 01/16/2015  . Pneumococcal Polysaccharide-23 05/12/2017  . Tdap 06/15/2018   Pertinent  Health Maintenance Due  Topic Date Due  . INFLUENZA VACCINE  07/08/2019  . DEXA SCAN  Completed   Fall Risk  07/10/2019 03/14/2019 02/24/2019 02/24/2019 06/15/2018  Falls in the past year? 1 1 1 1  No  Number falls in past yr: 0 0 - 0 -  Injury with Fall? 1 0 - 0 -   There were no vitals filed for this visit. There is no height or weight on file to calculate BMI. Physical Exam  unable to complete on Telephone visit.   Labs reviewed: Recent Labs     02/24/19 1113  NA 140  K 4.7  CL 107  CO2 25  GLUCOSE 71  BUN 15  CREATININE 0.90  CALCIUM 10.5*   Recent Labs    02/24/19 1113  AST 14  ALT 8  BILITOT 0.5  PROT 6.7   No results for input(s): WBC, NEUTROABS, HGB, HCT, MCV, PLT in the last 8760 hours. Lab Results  Component Value Date   TSH 1.09 06/13/2018   Lab Results  Component Value Date   HGBA1C 5.7 (H) 02/24/2019   Lab Results  Component Value Date   CHOL 191 02/24/2019   HDL 49 (L) 02/24/2019   LDLCALC 121 (H) 02/24/2019   TRIG 104 02/24/2019   CHOLHDL 3.9 02/24/2019    Significant Diagnostic Results in last 30 days:  No results found.  Assessment/Plan 1. Chronic cough No fever or chills reported.No exposure to person with COVID-19.On going chronic cough.continue on  Albuterol 90 mcg/ACT inhaler 2 puffs every 6 hours as needed.continue to practice social distance,wear mask and hand hygiene per CDC Guidelines.Request excuse from work due to her advance age, history of breast cancer and her chronic cough makes her high risk with COVID-19 exposure.Work excuse letter written to be mailed to patient.   2. Allergic rhinitis, unspecified seasonality, unspecified trigger No fever reported.Recommended Loratadine 10 mg tablet one by mouth once daily.Notify provider if symptoms worsen or running fever > 100.5.    Family/ staff Communication: Reviewed plan of care with patient.   Labs/tests ordered: None   Spent 12 minutes of non-face to face with patient    Sandrea Hughs, NP

## 2019-07-11 ENCOUNTER — Encounter: Payer: Self-pay | Admitting: Family

## 2019-07-24 ENCOUNTER — Ambulatory Visit
Admission: RE | Admit: 2019-07-24 | Discharge: 2019-07-24 | Disposition: A | Discharge status: Home / Self Care | Payer: BC Managed Care – PPO | Source: Ambulatory Visit | Attending: Nurse Practitioner | Admitting: Nurse Practitioner

## 2019-07-24 ENCOUNTER — Other Ambulatory Visit: Payer: Self-pay

## 2019-07-24 DIAGNOSIS — Z1231 Encounter for screening mammogram for malignant neoplasm of breast: Secondary | ICD-10-CM

## 2019-07-26 ENCOUNTER — Telehealth (HOSPITAL_COMMUNITY): Payer: Self-pay

## 2019-07-26 NOTE — Telephone Encounter (Signed)
Patient is calling because she would like a letter to take her out of work. Patient states that she is working from home, but it is extremely stressful as they will not help her with the tools she needs to preform her job. Patient would like a call from you to discuss time off and what she should so. You can reach her on her cell at [phone]

## 2019-07-26 NOTE — Telephone Encounter (Signed)
I called earlier and I called again few minutes ago.  She did not pick up the phone and left a message.  She needs to explain about her out of work to the nurse and we will review.

## 2019-07-26 NOTE — Telephone Encounter (Signed)
Patient just called and thinks she may have missed your call. She couldn't get to the phone but said if you would like to call her back she is available. Thank you.

## 2019-07-28 NOTE — Telephone Encounter (Signed)
I have called patient twice with no answer, I will try again on Monday.

## 2019-07-31 NOTE — Telephone Encounter (Signed)
Patient called me back this morning, sh states she would like 60 days to de-stress and get what she needs to work from home. Please advise, thank you

## 2019-07-31 NOTE — Telephone Encounter (Signed)
Will she consider IOP and we can take her off while she do the IOP.  We can recommend work from home if her employer agreed and she has option.

## 2019-08-01 ENCOUNTER — Telehealth (HOSPITAL_COMMUNITY): Payer: Self-pay | Admitting: Psychiatry

## 2019-08-01 NOTE — Telephone Encounter (Signed)
D:  Dr. Adele Schilder referred pt to [facility].  A:  Placed call to orient and provide pt with a start date.  There was no answer, so vm was left for pt to call case manager back.  Inform Dr. Adele Schilder.

## 2019-08-15 ENCOUNTER — Ambulatory Visit: Payer: BC Managed Care – PPO | Admitting: Nurse Practitioner

## 2019-08-22 ENCOUNTER — Ambulatory Visit: Payer: BC Managed Care – PPO | Admitting: Nurse Practitioner

## 2019-08-23 ENCOUNTER — Ambulatory Visit: Payer: BC Managed Care – PPO | Admitting: Nurse Practitioner

## 2019-09-13 ENCOUNTER — Other Ambulatory Visit: Payer: Self-pay

## 2019-09-13 ENCOUNTER — Encounter (HOSPITAL_COMMUNITY): Payer: Self-pay | Admitting: Psychiatry

## 2019-09-13 ENCOUNTER — Ambulatory Visit (INDEPENDENT_AMBULATORY_CARE_PROVIDER_SITE_OTHER): Payer: BC Managed Care – PPO | Admitting: Psychiatry

## 2019-09-13 DIAGNOSIS — F332 Major depressive disorder, recurrent severe without psychotic features: Secondary | ICD-10-CM

## 2019-09-13 DIAGNOSIS — F411 Generalized anxiety disorder: Secondary | ICD-10-CM | POA: Diagnosis not present

## 2019-09-13 MED ORDER — SERTRALINE HCL 50 MG PO TABS: 75.0000 mg | ORAL_TABLET | Freq: Every day | ORAL | 1 refills | Status: DC

## 2019-09-13 MED ORDER — TRAZODONE HCL 50 MG PO TABS: 50.0000 mg | ORAL_TABLET | Freq: Every evening | ORAL | 0 refills | Status: DC | PRN

## 2019-09-13 NOTE — Progress Notes (Signed)
Virtual Visit via Telephone Note  I connected with Erin Cunningham on 09/13/19 at  1:00 PM EDT by telephone and verified that I am speaking with the correct person using two identifiers.   I discussed the limitations, risks, security and privacy concerns of performing an evaluation and management service by telephone and the availability of in person appointments. I also discussed with the patient that there may be a patient responsible charge related to this service. The patient expressed understanding and agreed to proceed.   History of Present Illness: Patient was evaluated by phone session.  She is under a lot of stress because her husband had a stroke and she has to call 911 last week when she find out that he could not move.  Patient told he had an MRI and find out that he has old strokes in the past.  Now the husband is slowly recovering and he is able to do his ADLs but she is very concerned because his blood pressure is fluctuating and he is very weak lethargic and need assistance.  She is not sure how to handle because she is also working from home.  She is a Radio producer.  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  Patient has no support system.  Her son who lives close by but he has been busy and not able to help her.  She admitted racing thoughts, poor sleep, crying spells a lot of anxiety and stress.  Though she denies any suicidal thoughts or homicidal thought but feel very tired, depressed.  Her energy level is low.  She has no motivation to do things.  She denies any hallucination or any mania.    Past Psychiatric History:Reviewed H/Odepression and anxiety. [facility]. H/O brief admissionat OBS unit in 2016. No h/omania, psychosis, hallucination or suicidal attempt.  Psychiatric Specialty Exam: Physical Exam  ROS  There were no vitals taken for this visit.There is no height or weight on file to calculate  BMI.  General Appearance: NA  Eye Contact:  NA  Speech:  Clear and Coherent  Volume:  Normal  Mood:  Anxious and stress  Affect:  NA  Thought Process:  Goal Directed  Orientation:  Full (Time, Place, and Person)  Thought Content:  Rumination  Suicidal Thoughts:  No  Homicidal Thoughts:  No  Memory:  Immediate;   Good Recent;   Good Remote;   Good  Judgement:  Good  Insight:  Good  Psychomotor Activity:  NA  Concentration:  Concentration: Fair and Attention Span: Fair  Recall:  Good  Fund of Knowledge:  Good  Language:  Good  Akathisia:  No  Handed:  Right  AIMS (if indicated):     Assets:  Communication Skills Desire for Improvement Housing  ADL's:  Intact  Cognition:  WNL  Sleep:   poor     Assessment and Plan: Major depressive disorder, recurrent.  Generalized anxiety disorder.  Discussed her current psychosocial condition.  She is feeling overwhelmed due to her husband diagnosed with stroke and require a lot of assistance.  I recommend that she should call her husband's PCP as patient may need rehab and management of blood pressure.  I also recommend therapy but patient at this time is not interested in individual therapy and group therapy.  However she agreed to give a trial of Zoloft 75 mg and also like to go back on trazodone 50 mg which she used to take  it many years ago with good response.  We discussed medication side effects and benefits.  I also encourage if she feels that she need to take some time off from the work to take care of her husband we can do that.  Patient told that she will think about it but she wants to call her husband's PCP first.  We will try Zoloft 75 mg daily and trazodone 50 mg at bedtime for sleep as needed.  Recommended to call us back if she has any question of any concern.  Follow-up in 2 months.  Follow Up Instructions:    I discussed the assessment and treatment plan with the patient. The patient was provided an opportunity to ask  questions and all were answered. The patient agreed with the plan and demonstrated an understanding of the instructions.   The patient was advised to call back or seek an in-person evaluation if the symptoms worsen or if the condition fails to improve as anticipated.  I provided 20 minutes of non-face-to-face time during this encounter.   [name], MD

## 2019-09-21 ENCOUNTER — Encounter (HOSPITAL_COMMUNITY): Payer: Self-pay

## 2019-11-13 ENCOUNTER — Ambulatory Visit (INDEPENDENT_AMBULATORY_CARE_PROVIDER_SITE_OTHER): Payer: BC Managed Care – PPO | Admitting: Psychiatry

## 2019-11-13 ENCOUNTER — Other Ambulatory Visit: Payer: Self-pay

## 2019-11-13 ENCOUNTER — Encounter (HOSPITAL_COMMUNITY): Payer: Self-pay | Admitting: Psychiatry

## 2019-11-13 DIAGNOSIS — F332 Major depressive disorder, recurrent severe without psychotic features: Secondary | ICD-10-CM

## 2019-11-13 DIAGNOSIS — F411 Generalized anxiety disorder: Secondary | ICD-10-CM | POA: Diagnosis not present

## 2019-11-13 MED ORDER — SERTRALINE HCL 50 MG PO TABS: 75.0000 mg | ORAL_TABLET | Freq: Every day | ORAL | 1 refills | Status: DC

## 2019-11-13 MED ORDER — TRAZODONE HCL 50 MG PO TABS: 50.0000 mg | ORAL_TABLET | Freq: Every evening | ORAL | 0 refills | Status: DC | PRN

## 2019-11-13 NOTE — Progress Notes (Signed)
Virtual Visit via Telephone Note  I connected with Debara Pickett on 11/13/19 at  1:20 PM EST by telephone and verified that I am speaking with the correct person using two identifiers.   I discussed the limitations, risks, security and privacy concerns of performing an evaluation and management service by telephone and the availability of in person appointments. I also discussed with the patient that there may be a patient responsible charge related to this service. The patient expressed understanding and agreed to proceed.   History of Present Illness: Patient was evaluated by phone session.  On the last visit we increase Zoloft and trazodone and she is doing much better with increase Zoloft.  She takes trazodone as needed.  She reported her husband is not doing well and continues to have issues with the blood pressure.  Her husband is getting MRI and of this month.  She is working from home.  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  She wanted to extend her work letter as her husband needs her for the ADL.  Her husband had a stroke and she has to take him to the doctor's appointment.  Overall she feels increase Zoloft help her crying spells, depression, anxiety and feeling of hopelessness.  Her energy level is improved.  She is tolerating medication without any side effects.  She denies any hallucination, paranoia or any suicidal thoughts.  Past Psychiatric History:Reviewed H/Odepression and anxiety. One inpatient [facility] for[facility]icidal thoughts. H/O brief admissionat OBS unit in 2016. No h/omania, psychosis, hallucination or suicidal attempt.   Psychiatric Specialty Exam: Physical Exam  ROS  There were no vitals taken for this visit.There is no height or weight on file to calculate BMI.  General Appearance: NA  Eye Contact:  NA  Speech:  Clear and Coherent  Volume:  Normal  Mood:  Anxious  Affect:  NA  Thought Process:  Goal Directed  Orientation:   Full (Time, Place, and Person)  Thought Content:  Rumination  Suicidal Thoughts:  No  Homicidal Thoughts:  No  Memory:  Immediate;   Good Recent;   Good Remote;   Good  Judgement:  Good  Insight:  Good  Psychomotor Activity:  NA  Concentration:  Concentration: Fair and Attention Span: Good  Recall:  Good  Fund of Knowledge:  Good  Language:  Good  Akathisia:  No  Handed:  Right  AIMS (if indicated):     Assets:  Communication Skills Desire for Improvement Housing Resilience Talents/Skills Transportation  ADL's:  Intact  Cognition:  WNL  Sleep:   6-7 hrs      Assessment and Plan: Major depressive disorder, recurrent.  Generalized anxiety disorder.  Patient doing better with increase Zoloft.  Continue Zoloft 25 mg daily and take trazodone 50 mg as needed for insomnia.  Discussed medication side effects and benefits.  Recommended to call us back if she has any question of any concern.  We will support and continue to recommend to work from home as patient's husband not doing well and she is taking care of her husband.  Discussed medication side effects and benefits.  Recommended to call us back if she has any question or any concern.  Follow-up in 2 months.  Follow Up Instructions:    I discussed the assessment and treatment plan with the patient. The patient was provided an opportunity to ask questions and all were answered. The patient agreed with the plan and demonstrated an understanding  of the instructions.   The patient was advised to call back or seek an in-person evaluation if the symptoms worsen or if the condition fails to improve as anticipated.  I provided 15 minutes of non-face-to-face time during this encounter.   [name], MD

## 2019-11-15 ENCOUNTER — Encounter (HOSPITAL_COMMUNITY): Payer: Self-pay

## 2019-11-15 ENCOUNTER — Telehealth (HOSPITAL_COMMUNITY): Payer: Self-pay

## 2019-11-15 NOTE — Telephone Encounter (Signed)
Ok. Erin Cunningham

## 2019-11-17 NOTE — Telephone Encounter (Signed)
Letter completed and faxed.

## 2020-01-15 ENCOUNTER — Encounter: Payer: Self-pay | Admitting: Nurse Practitioner

## 2020-01-15 ENCOUNTER — Ambulatory Visit (INDEPENDENT_AMBULATORY_CARE_PROVIDER_SITE_OTHER): Payer: BC Managed Care – PPO | Admitting: Nurse Practitioner

## 2020-01-15 ENCOUNTER — Other Ambulatory Visit: Payer: Self-pay

## 2020-01-15 ENCOUNTER — Encounter (HOSPITAL_COMMUNITY): Payer: Self-pay | Admitting: Psychiatry

## 2020-01-15 ENCOUNTER — Ambulatory Visit (INDEPENDENT_AMBULATORY_CARE_PROVIDER_SITE_OTHER): Payer: BC Managed Care – PPO | Admitting: Psychiatry

## 2020-01-15 VITALS — BP 138/80 | HR 58 | Temp 97.3°F | Ht 63.0 in | Wt 165.6 lb

## 2020-01-15 DIAGNOSIS — R21 Rash and other nonspecific skin eruption: Secondary | ICD-10-CM

## 2020-01-15 DIAGNOSIS — E2839 Other primary ovarian failure: Secondary | ICD-10-CM | POA: Diagnosis not present

## 2020-01-15 DIAGNOSIS — E785 Hyperlipidemia, unspecified: Secondary | ICD-10-CM

## 2020-01-15 DIAGNOSIS — I10 Essential (primary) hypertension: Secondary | ICD-10-CM

## 2020-01-15 DIAGNOSIS — F411 Generalized anxiety disorder: Secondary | ICD-10-CM | POA: Diagnosis not present

## 2020-01-15 DIAGNOSIS — Z23 Encounter for immunization: Secondary | ICD-10-CM

## 2020-01-15 DIAGNOSIS — R739 Hyperglycemia, unspecified: Secondary | ICD-10-CM

## 2020-01-15 DIAGNOSIS — F332 Major depressive disorder, recurrent severe without psychotic features: Secondary | ICD-10-CM

## 2020-01-15 LAB — CBC WITH DIFFERENTIAL/PLATELET
Absolute Monocytes: 502 cells/uL (ref 200–950)
Basophils Absolute: 120 cells/uL (ref 0–200)
Basophils Relative: 2.1 %
Eosinophils Absolute: 274 cells/uL (ref 15–500)
Eosinophils Relative: 4.8 %
HCT: 39 % (ref 35.0–45.0)
Hemoglobin: 13 g/dL (ref 11.7–15.5)
Lymphs Abs: 1927 cells/uL (ref 850–3900)
MCH: 28.8 pg (ref 27.0–33.0)
MCHC: 33.3 g/dL (ref 32.0–36.0)
MCV: 86.3 fL (ref 80.0–100.0)
MPV: 11.9 fL (ref 7.5–12.5)
Monocytes Relative: 8.8 %
Neutro Abs: 2879 cells/uL (ref 1500–7800)
Neutrophils Relative %: 50.5 %
Platelets: 311 10*3/uL (ref 140–400)
RBC: 4.52 10*6/uL (ref 3.80–5.10)
RDW: 13 % (ref 11.0–15.0)
Total Lymphocyte: 33.8 %
WBC: 5.7 10*3/uL (ref 3.8–10.8)

## 2020-01-15 LAB — COMPLETE METABOLIC PANEL WITH GFR
AG Ratio: 1.4 (calc) (ref 1.0–2.5)
ALT: 7 U/L (ref 6–29)
AST: 13 U/L (ref 10–35)
Albumin: 3.8 g/dL (ref 3.6–5.1)
Alkaline phosphatase (APISO): 105 U/L (ref 37–153)
BUN: 18 mg/dL (ref 7–25)
CO2: 25 mmol/L (ref 20–32)
Calcium: 10.5 mg/dL — ABNORMAL HIGH (ref 8.6–10.4)
Chloride: 109 mmol/L (ref 98–110)
Creat: 0.86 mg/dL (ref 0.60–0.93)
GFR, Est African American: 75 mL/min/{1.73_m2} (ref >=60)
GFR, Est Non African American: 65 mL/min/{1.73_m2} (ref >=60)
Globulin: 2.7 g/dL (calc) (ref 1.9–3.7)
Glucose, Bld: 86 mg/dL (ref 65–99)
Potassium: 4.3 mmol/L (ref 3.5–5.3)
Sodium: 141 mmol/L (ref 135–146)
Total Bilirubin: 0.5 mg/dL (ref 0.2–1.2)
Total Protein: 6.5 g/dL (ref 6.1–8.1)

## 2020-01-15 LAB — LIPID PANEL
Cholesterol: 204 mg/dL — ABNORMAL HIGH (ref <200)
HDL: 46 mg/dL — ABNORMAL LOW (ref >=50)
LDL Cholesterol (Calc): 138 mg/dL (calc) — ABNORMAL HIGH
Non-HDL Cholesterol (Calc): 158 mg/dL (calc) — ABNORMAL HIGH (ref <130)
Total CHOL/HDL Ratio: 4.4 (calc) (ref <5.0)
Triglycerides: 97 mg/dL (ref <150)

## 2020-01-15 MED ORDER — LOSARTAN POTASSIUM 100 MG PO TABS: 100.0000 mg | ORAL_TABLET | Freq: Every day | ORAL | 3 refills | Status: DC

## 2020-01-15 MED ORDER — SERTRALINE HCL 50 MG PO TABS: 75.0000 mg | ORAL_TABLET | Freq: Every day | ORAL | 2 refills | Status: DC

## 2020-01-15 NOTE — Progress Notes (Signed)
Careteam: Patient Care Team: Lauree Chandler, NP as PCP - General (Geriatric Medicine) [name], MD as Consulting Physician (Otolaryngology)  Advanced Directive information    No Known Allergies  Chief Complaint  Patient presents with  . Medical Management of Chronic Issues    10 month follow-up   . Skin Problem    Examine arm and left upper thigh. Patient with discolored area that was itchy and raised (now resolved)  . Immunizations    Discuss need for flu and covid vaccine      HPI: Patient is a 79 y.o. female for routine follow up.  Was doing exercise until the cold weather set in.   Husband had a stroke since her last OV. She had to stay home with him.  Depression/anxiety- followed by behavioral health. Seeing today. Continues on zoloft and trazodone.  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  Small rash to left wrist and upper arm that has resolved after applying some cream she used from home.   Review of Systems:  Review of Systems  Constitutional: Negative for chills, fever and weight loss.  HENT: Negative for tinnitus.   Respiratory: Negative for cough, sputum production and shortness of breath.   Cardiovascular: Negative for chest pain, palpitations and leg swelling.  Gastrointestinal: Negative for abdominal pain, constipation, diarrhea and heartburn.  Genitourinary: Negative for dysuria, frequency and urgency.  Musculoskeletal: Negative for back pain, falls, joint pain and myalgias.  Skin: Negative.   Neurological: Negative for dizziness and headaches.  Psychiatric/Behavioral: Negative for depression and memory loss. The patient does not have insomnia.     Past Medical History:  Diagnosis Date  . Breast CA ([place]) 2006   right [facility])  . Cancer ([place])   . Helicobacter pylori gastritis 2001  . High blood pressure   . History of adenomatous polyp of colon  09/23/2017   Oma 09/25/2017 and apparently polyps in the past as well though recalled age  . History of shingles   . Major depressive disorder, recurrent, severe without psychotic features ([place]) 06/24/2015  . Parotid mass 2014   right benign cyst  . Personal history of radiation therapy 2006   Rt breast  . Wears glasses    Past Surgical History:  Procedure Laterality Date  . ABDOMINAL HYSTERECTOMY  1982   BSO  . BREAST LUMPECTOMY Right 2007  . BREAST LUMPECTOMY WITH SENTINEL LYMPH NODE BIOPSY  2005   right  . BREAST SURGERY  2004   rt br mass  . CESAREAN SECTION    . COLONOSCOPY    . ESOPHAGOGASTRODUODENOSCOPY  2001   H pylori gastritis  . EYE SURGERY     both cataracts  . PAROTIDECTOMY Right 09/05/2013   Procedure: RIGHT SUPERFICIAL PAROTIDECTOMY WITH FACIAL NERVE DISECTION;  Surgeon: [name], MD;  Location: [facility];  Service: ENT;  Laterality: Right;  All documentation done by R.Ward after [phone] --done under G. Garzon's password  . RE-EXCISION OF BREAST LUMPECTOMY  2005   right   Social History:   reports that she has never smoked. She quit smokeless tobacco use about 2 years ago.  Her smokeless tobacco use included chew. She reports current alcohol use. She reports that she does not use drugs.  Family History  Problem Relation Age of Onset  . Heart disease Mother        MI  . Cancer Father  bone  . Thyroid disease Neg Hx   . Hypercalcemia Neg Hx   . Colon cancer Neg Hx   . Stomach cancer Neg Hx   . Rectal cancer Neg Hx   . Esophageal cancer Neg Hx     Medications: Patient's Medications  New Prescriptions   No medications on file  Previous Medications   LOSARTAN (COZAAR) 100 MG TABLET    Take 1 tablet (100 mg total) by mouth daily.   MULTIPLE VITAMINS-MINERALS (CENTRUM MULTIGUMMIES) CHEW    Chew 2 tablets by mouth daily.   SERTRALINE (ZOLOFT) 50 MG TABLET    Take 1.5 tablets (75 mg total) by mouth daily.   TRAZODONE (DESYREL)  50 MG TABLET    Take 1 tablet (50 mg total) by mouth at bedtime as needed for sleep.  Modified Medications   No medications on file  Discontinued Medications   ALBUTEROL (VENTOLIN HFA) 108 (90 BASE) MCG/ACT INHALER    Inhale 2 puffs into the lungs every 6 (six) hours as needed for wheezing or shortness of breath.    Physical Exam:  Vitals:   01/15/20 0951  BP: 138/80  Pulse: (!) 58  Temp: (!) 97.3 F (36.3 C)  TempSrc: Temporal  SpO2: 99%  Weight: 165 lb 9.6 oz (75.1 kg)  Height: '5\' 3"'  (1.6 m)   Body mass index is 29.33 kg/m. Wt Readings from Last 3 Encounters:  01/15/20 165 lb 9.6 oz (75.1 kg)  02/24/19 169 lb (76.7 kg)  08/30/18 177 lb (80.3 kg)    Physical Exam Constitutional:      General: She is not in acute distress.    Appearance: She is well-developed. She is not diaphoretic.  HENT:     Head: Normocephalic and atraumatic.     Mouth/Throat:     Pharynx: No oropharyngeal exudate.  Eyes:     Conjunctiva/sclera: Conjunctivae normal.     Pupils: Pupils are equal, round, and reactive to light.  Cardiovascular:     Rate and Rhythm: Normal rate and regular rhythm.     Heart sounds: Normal heart sounds.  Pulmonary:     Effort: Pulmonary effort is normal.     Breath sounds: Normal breath sounds.  Abdominal:     General: Bowel sounds are normal.     Palpations: Abdomen is soft.  Musculoskeletal:        General: No tenderness.     Cervical back: Normal range of motion and neck supple.  Skin:    General: Skin is warm and dry.  Neurological:     Mental Status: She is alert and oriented to person, place, and time.     Labs reviewed: Basic Metabolic Panel: Recent Labs    02/24/19 1113  NA 140  K 4.7  CL 107  CO2 25  GLUCOSE 71  BUN 15  CREATININE 0.90  CALCIUM 10.5*   Liver Function Tests: Recent Labs    02/24/19 1113  AST 14  ALT 8  BILITOT 0.5  PROT 6.7   No results for input(s): LIPASE, AMYLASE in the last 8760 hours. No results for  input(s): AMMONIA in the last 8760 hours. CBC: No results for input(s): WBC, NEUTROABS, HGB, HCT, MCV, PLT in the last 8760 hours. Lipid Panel: Recent Labs    02/24/19 1113  CHOL 191  HDL 49*  LDLCALC 121*  TRIG 104  CHOLHDL 3.9   TSH: No results for input(s): TSH in the last 8760 hours. A1C: Lab Results  Component Value Date  HGBA1C 5.7 (H) 02/24/2019     Assessment/Plan 1. Essential hypertension, benign -controlled on medication with dietary modifications.  - losartan (COZAAR) 100 MG tablet; Take 1 tablet (100 mg total) by mouth daily.  Dispense: 90 tablet; Refill: 3 - CMP with eGFR([facility]) - CBC with Differential/Platelet  2. Estrogen deficiency - DG Bone Density; Future  3. Major depressive disorder, recurrent severe without psychotic features ([place]) Stable on zoloft with trazodone. Continues to see psychiatrist routinely.  4. Rash Has resolved with ointment she used from home. Does not know what the ointment is at this time however rash has resolved.  5. Hyperlipidemia LDL goal <130 -continues with dietary modifications.  - Lipid Panel - CMP with eGFR([facility])  6. Hyperglycemia -to continue diet modifications, will follow up lab today - Hemoglobin A1c  7. Need for influenza vaccination - Flu Vaccine QUAD High Dose(Fluad)    Next appt:  79month, to schedule AWV  Monic Engelmann K. [place]orth [place] [facility] [phone]

## 2020-01-15 NOTE — Patient Instructions (Addendum)
COVID-19 Vaccine Information can be found at: [email or web address].uk For questions related to vaccine distribution or appointments, please email [email or web address] or call [phone].   You can also call the COVID-19 Line [phone] [email or web address].    DASH Eating Plan DASH stands for "Dietary Approaches to Stop Hypertension." The DASH eating plan is a healthy eating plan that has been shown to reduce high blood pressure (hypertension). It may also reduce your risk for type 2 diabetes, heart disease, and stroke. The DASH eating plan may also help with weight loss. What are tips for following this plan?  General guidelines  Avoid eating more than 2,300 mg (milligrams) of salt (sodium) a day. If you have hypertension, you may need to reduce your sodium intake to 1,500 mg a day.  Limit alcohol intake to no more than 1 drink a day for nonpregnant women and 2 drinks a day for men. One drink equals 12 oz of beer, 5 oz of wine, or 1 oz of hard liquor.  Work with your health care provider to maintain a healthy body weight or to lose weight. Ask what an ideal weight is for you.  Get at least 30 minutes of exercise that causes your heart to beat faster (aerobic exercise) most days of the week. Activities may include walking, swimming, or biking.  Work with your health care provider or diet and nutrition specialist (dietitian) to adjust your eating plan to your individual calorie needs. Reading food labels   Check food labels for the amount of sodium per serving. Choose foods with less than 5 percent of the Daily Value of sodium. Generally, foods with less than 300 mg of sodium per serving fit into this eating plan.  To find whole grains, look for the word "whole" as the first word in the ingredient list. Shopping  Buy products labeled as "low-sodium" or "no salt added."  Buy fresh foods. Avoid canned  foods and premade or frozen meals. Cooking  Avoid adding salt when cooking. Use salt-free seasonings or herbs instead of table salt or sea salt. Check with your health care provider or pharmacist before using salt substitutes.  Do not fry foods. Cook foods using healthy methods such as baking, boiling, grilling, and broiling instead.  Cook with heart-healthy oils, such as olive, canola, soybean, or sunflower oil. Meal planning  Eat a balanced diet that includes: ? 5 or more servings of fruits and vegetables each day. At each meal, try to fill half of your plate with fruits and vegetables. ? Up to 6-8 servings of whole grains each day. ? Less than 6 oz of lean meat, poultry, or fish each day. A 3-oz serving of meat is about the same size as a deck of cards. One egg equals 1 oz. ? 2 servings of low-fat dairy each day. ? A serving of nuts, seeds, or beans 5 times each week. ? Heart-healthy fats. Healthy fats called Omega-3 fatty acids are found in foods such as flaxseeds and coldwater fish, like sardines, salmon, and mackerel.  Limit how much you eat of the following: ? Canned or prepackaged foods. ? Food that is high in trans fat, such as fried foods. ? Food that is high in saturated fat, such as fatty meat. ? Sweets, desserts, sugary drinks, and other foods with added sugar. ? Full-fat dairy products.  Do not salt foods before eating.  Try to eat at least 2 vegetarian meals each week.  Eat more home-cooked food and less restaurant, buffet, and fast food.  When  eating at a restaurant, ask that your food be prepared with less salt or no salt, if possible. What foods are recommended? The items listed may not be a complete list. Talk with your dietitian about what dietary choices are best for you. Grains Whole-grain or whole-wheat bread. Whole-grain or whole-wheat pasta. Brown rice. [place]. Bulgur. Whole-grain and low-sodium cereals. Pita bread. Low-fat, low-sodium crackers.  Whole-wheat flour tortillas. Vegetables Fresh or frozen vegetables (raw, steamed, roasted, or grilled). Low-sodium or reduced-sodium tomato and vegetable juice. Low-sodium or reduced-sodium tomato sauce and tomato paste. Low-sodium or reduced-sodium canned vegetables. Fruits All fresh, dried, or frozen fruit. Canned fruit in natural juice (without added sugar). Meat and other protein foods Skinless chicken or [place]. Ground chicken or [place]. Pork with fat trimmed off. Fish and seafood. Egg whites. Dried beans, peas, or lentils. Unsalted nuts, nut butters, and seeds. Unsalted canned beans. Lean cuts of beef with fat trimmed off. Low-sodium, lean deli meat. Dairy Low-fat (1%) or fat-free (skim) milk. Fat-free, low-fat, or reduced-fat cheeses. Nonfat, low-sodium ricotta or cottage cheese. Low-fat or nonfat yogurt. Low-fat, low-sodium cheese. Fats and oils Soft margarine without trans fats. Vegetable oil. Low-fat, reduced-fat, or light mayonnaise and salad dressings (reduced-sodium). Canola, safflower, olive, soybean, and sunflower oils. Avocado. Seasoning and other foods Herbs. Spices. Seasoning mixes without salt. Unsalted popcorn and pretzels. Fat-free sweets. What foods are not recommended? The items listed may not be a complete list. Talk with your dietitian about what dietary choices are best for you. Grains Baked goods made with fat, such as croissants, muffins, or some breads. Dry pasta or rice meal packs. Vegetables Creamed or fried vegetables. Vegetables in a cheese sauce. Regular canned vegetables (not low-sodium or reduced-sodium). Regular canned tomato sauce and paste (not low-sodium or reduced-sodium). Regular tomato and vegetable juice (not low-sodium or reduced-sodium). Angie Fava. Olives. Fruits Canned fruit in a light or heavy syrup. Fried fruit. Fruit in cream or butter sauce. Meat and other protein foods Fatty cuts of meat. Ribs. Fried meat. Berniece Salines. Sausage. Bologna and other  processed lunch meats. Salami. Fatback. Hotdogs. Bratwurst. Salted nuts and seeds. Canned beans with added salt. Canned or smoked fish. Whole eggs or egg yolks. Chicken or [place] with skin. Dairy Whole or 2% milk, cream, and half-and-half. Whole or full-fat cream cheese. Whole-fat or sweetened yogurt. Full-fat cheese. Nondairy creamers. Whipped toppings. Processed cheese and cheese spreads. Fats and oils Butter. Stick margarine. Lard. Shortening. Ghee. Bacon fat. Tropical oils, such as coconut, palm kernel, or palm oil. Seasoning and other foods Salted popcorn and pretzels. Onion salt, garlic salt, seasoned salt, table salt, and sea salt. Worcestershire sauce. Tartar sauce. Barbecue sauce. Teriyaki sauce. Soy sauce, including reduced-sodium. Steak sauce. Canned and packaged gravies. Fish sauce. Oyster sauce. Cocktail sauce. Horseradish that you find on the shelf. Ketchup. Mustard. Meat flavorings and tenderizers. Bouillon cubes. Hot sauce and Tabasco sauce. Premade or packaged marinades. Premade or packaged taco seasonings. Relishes. Regular salad dressings. Where to find more information:  National Heart, Lung, and [place]: https://wilson-eaton.com/  American Heart Association: www.heart.org Summary  The DASH eating plan is a healthy eating plan that has been shown to reduce high blood pressure (hypertension). It may also reduce your risk for type 2 diabetes, heart disease, and stroke.  With the DASH eating plan, you should limit salt (sodium) intake to 2,300 mg a day. If you have hypertension, you may need to reduce your sodium intake to 1,500 mg a day.  When on the DASH eating plan, aim  to eat more fresh fruits and vegetables, whole grains, lean proteins, low-fat dairy, and heart-healthy fats.  Work with your health care provider or diet and nutrition specialist (dietitian) to adjust your eating plan to your individual calorie needs. This information is not intended to replace advice given to  you by your health care provider. Make sure you discuss any questions you have with your health care provider. Document Revised: 11/05/2017 Document Reviewed: 11/16/2016 Elsevier Patient Education  2020 [employer].

## 2020-01-15 NOTE — Progress Notes (Signed)
Virtual Visit via Telephone Note  I connected with Erin Cunningham on 01/15/20 at  3:20 PM EST by telephone and verified that I am speaking with the correct person using two identifiers.   I discussed the limitations, risks, security and privacy concerns of performing an evaluation and management service by telephone and the availability of in person appointments. I also discussed with the patient that there may be a patient responsible charge related to this service. The patient expressed understanding and agreed to proceed.   History of Present Illness: Patient was evaluated by phone session.  She is doing much better since husband is doing well.  She started going to school in person and she really enjoyed that.  She admitted still some time husband gave hard time but she is trying to avoid argue to him.  She still need to take him to the doctor's appointment because he cannot drive but he is able to do most of his ADLs.  Patient feel increase Zoloft has helped her a lot.  She does not take trazodone every night since sleeping is improved.  She denies any crying spells or any feeling of hopelessness or worthlessness.  Energy level is okay.  Her appetite is okay.  She has no tremors, shakes or any EPS.  Today she had a visit to PCP and had blood work.  Past Psychiatric History:Reviewed H/Odepression and anxiety. One inpatient [facility] forsuicidal thoughts. H/O brief stay in OBS unit in 2016. No h/omania, psychosis, hallucination or suicidal attempt.    Psychiatric Specialty Exam: Physical Exam  Review of Systems  There were no vitals taken for this visit.There is no height or weight on file to calculate BMI.  General Appearance: NA  Eye Contact:  NA  Speech:  Clear and Coherent  Volume:  Normal  Mood:  Euthymic  Affect:  NA  Thought Process:  Goal Directed  Orientation:  Full (Time, Place, and Person)  Thought Content:  Rumination  Suicidal Thoughts:  No  Homicidal Thoughts:  No   Memory:  Immediate;   Good Recent;   Good Remote;   Good  Judgement:  Good  Insight:  Present  Psychomotor Activity:  NA  Concentration:  Concentration: Good and Attention Span: Good  Recall:  Good  Fund of Knowledge:  Good  Language:  Good  Akathisia:  No  Handed:  Right  AIMS (if indicated):     Assets:  Communication Skills Desire for Improvement Housing Resilience  ADL's:  Intact  Cognition:  WNL  Sleep:   better      Assessment and Plan: Major depressive disorder, recurrent.  Generalized anxiety disorder.  Patient doing well since husband improved physically and does not need her all the time.  She also started going to school in person.  She is a Pharmacist, hospital at [employer].  Continue Zoloft 75 mg daily.  She does not need a new prescription of trazodone but promised that she will call us if needed any more refills.  Discussed medication side effects and benefits.  Recommended to call us back if she has any question of any concern.  She has a support from her son who lives close by.  Follow-up in 3 months.  Follow Up Instructions:    I discussed the assessment and treatment plan with the patient. The patient was provided an opportunity to ask questions and all were answered. The patient agreed with the plan and demonstrated an understanding of the instructions.   The patient was  advised to call back or seek an in-person evaluation if the symptoms worsen or if the condition fails to improve as anticipated.  I provided 20 minutes of non-face-to-face time during this encounter.   [name], MD

## 2020-01-16 LAB — HEMOGLOBIN A1C
Hgb A1c MFr Bld: 5.5 % of total Hgb (ref <5.7)
Mean Plasma Glucose: 111 (calc)
eAG (mmol/L): 6.2 (calc)

## 2020-02-03 ENCOUNTER — Ambulatory Visit: Payer: BC Managed Care – PPO | Attending: Internal Medicine

## 2020-02-03 DIAGNOSIS — Z23 Encounter for immunization: Secondary | ICD-10-CM

## 2020-02-03 NOTE — Progress Notes (Signed)
   Covid-19 Vaccination Clinic  Name:  Erin Cunningham    MRN: [id number] DOB: 05-31-1941  02/03/2020  Erin Cunningham was observed post Covid-19 immunization for 15 minutes without incidence. She was provided with Vaccine Information Sheet and instruction to access the V-Safe system.   Erin Cunningham was instructed to call 911 with any severe reactions post vaccine: Marland Kitchen Difficulty breathing  . Swelling of your face and throat  . A fast heartbeat  . A bad rash all over your body  . Dizziness and weakness    Immunizations Administered    Name Date Dose VIS Date Route   Pfizer COVID-19 Vaccine 02/03/2020  9:45 AM 0.3 mL 11/17/2019 Intramuscular   Manufacturer: [place]   Lot: [id number]   [place]: [id number]

## 2020-02-24 ENCOUNTER — Ambulatory Visit: Payer: BC Managed Care – PPO | Attending: Internal Medicine

## 2020-02-24 DIAGNOSIS — Z23 Encounter for immunization: Secondary | ICD-10-CM

## 2020-02-24 NOTE — Progress Notes (Signed)
   Covid-19 Vaccination Clinic  Name:  Erin Cunningham    MRN: [id number] DOB: 1941/02/05  02/24/2020  Erin Cunningham was observed post Covid-19 immunization for 15 minutes without incident. She was provided with Vaccine Information Sheet and instruction to access the V-Safe system.   Erin Cunningham was instructed to call 911 with any severe reactions post vaccine: Marland Kitchen Difficulty breathing  . Swelling of face and throat  . A fast heartbeat  . A bad rash all over body  . Dizziness and weakness   Immunizations Administered    Name Date Dose VIS Date Route   Pfizer COVID-19 Vaccine 02/24/2020 10:27 AM 0.3 mL 11/17/2019 Intramuscular   Manufacturer: [place]   Lot: [id number]   [place]: [id number]

## 2020-02-28 ENCOUNTER — Ambulatory Visit: Payer: Self-pay

## 2020-03-08 ENCOUNTER — Other Ambulatory Visit: Payer: BC Managed Care – PPO

## 2020-04-05 ENCOUNTER — Other Ambulatory Visit: Payer: Self-pay

## 2020-04-05 ENCOUNTER — Ambulatory Visit
Admission: RE | Admit: 2020-04-05 | Discharge: 2020-04-05 | Disposition: A | Discharge status: Home / Self Care | Payer: BC Managed Care – PPO | Source: Ambulatory Visit | Attending: Nurse Practitioner | Admitting: Nurse Practitioner

## 2020-04-05 DIAGNOSIS — E2839 Other primary ovarian failure: Secondary | ICD-10-CM

## 2020-04-10 ENCOUNTER — Other Ambulatory Visit: Payer: Self-pay

## 2020-04-10 ENCOUNTER — Telehealth (HOSPITAL_COMMUNITY): Payer: BC Managed Care – PPO | Admitting: Psychiatry

## 2020-04-11 ENCOUNTER — Telehealth: Payer: Self-pay | Admitting: *Deleted

## 2020-04-11 NOTE — Telephone Encounter (Signed)
Patient notified and agreed. Medication list updated.  

## 2020-04-11 NOTE — Telephone Encounter (Signed)
-----   Message from Lauree Chandler, NP sent at 04/08/2020  3:53 PM EDT ----- Osteopenia noted, Recommended to take caltrate with D 600/400 twice a day with weight bearing exercises 30 mins 5 days a week

## 2020-05-27 ENCOUNTER — Encounter (HOSPITAL_COMMUNITY): Payer: Self-pay

## 2020-05-27 ENCOUNTER — Other Ambulatory Visit: Payer: Self-pay

## 2020-05-27 ENCOUNTER — Emergency Department (HOSPITAL_COMMUNITY)
Admission: EM | Admit: 2020-05-27 | Discharge: 2020-05-27 | Discharge status: Against Medical Advice | Payer: BC Managed Care – PPO | Source: Home / Self Care

## 2020-05-27 DIAGNOSIS — Z5321 Procedure and treatment not carried out due to patient leaving prior to being seen by health care provider: Secondary | ICD-10-CM | POA: Insufficient documentation

## 2020-05-27 DIAGNOSIS — I10 Essential (primary) hypertension: Secondary | ICD-10-CM | POA: Insufficient documentation

## 2020-05-27 DIAGNOSIS — I6381 Other cerebral infarction due to occlusion or stenosis of small artery: Secondary | ICD-10-CM | POA: Diagnosis not present

## 2020-05-27 DIAGNOSIS — R270 Ataxia, unspecified: Secondary | ICD-10-CM | POA: Diagnosis not present

## 2020-05-27 DIAGNOSIS — R42 Dizziness and giddiness: Secondary | ICD-10-CM | POA: Insufficient documentation

## 2020-05-27 MED ORDER — SODIUM CHLORIDE 0.9% FLUSH: 3.0000 mL | Freq: Once | INTRAVENOUS | Status: DC

## 2020-05-27 NOTE — ED Triage Notes (Addendum)
Patient c/o dizziness and hypertension, and right foot pain.  When asked about her right foot pain, patient denied having pain.

## 2020-05-27 NOTE — ED Notes (Signed)
Patient refused lab draw. States "I am leaving." Triage RN, Adela Lank, made aware.

## 2020-05-28 ENCOUNTER — Telehealth: Payer: Self-pay | Admitting: Adult Health

## 2020-05-28 ENCOUNTER — Other Ambulatory Visit: Payer: Self-pay

## 2020-05-28 ENCOUNTER — Emergency Department (HOSPITAL_BASED_OUTPATIENT_CLINIC_OR_DEPARTMENT_OTHER): Payer: BC Managed Care – PPO

## 2020-05-28 ENCOUNTER — Inpatient Hospital Stay (HOSPITAL_BASED_OUTPATIENT_CLINIC_OR_DEPARTMENT_OTHER)
Admission: EM | Admit: 2020-05-28 | Discharge: 2020-05-30 | DRG: 065 | Disposition: A | Discharge status: Home / Self Care | Payer: BC Managed Care – PPO | Attending: Student | Admitting: Student

## 2020-05-28 ENCOUNTER — Telehealth: Payer: Self-pay

## 2020-05-28 ENCOUNTER — Encounter (HOSPITAL_BASED_OUTPATIENT_CLINIC_OR_DEPARTMENT_OTHER): Payer: Self-pay | Admitting: Emergency Medicine

## 2020-05-28 DIAGNOSIS — Z79899 Other long term (current) drug therapy: Secondary | ICD-10-CM | POA: Diagnosis not present

## 2020-05-28 DIAGNOSIS — Z853 Personal history of malignant neoplasm of breast: Secondary | ICD-10-CM

## 2020-05-28 DIAGNOSIS — Z20822 Contact with and (suspected) exposure to covid-19: Secondary | ICD-10-CM | POA: Diagnosis present

## 2020-05-28 DIAGNOSIS — F419 Anxiety disorder, unspecified: Secondary | ICD-10-CM | POA: Diagnosis not present

## 2020-05-28 DIAGNOSIS — Z923 Personal history of irradiation: Secondary | ICD-10-CM

## 2020-05-28 DIAGNOSIS — R260 Ataxic gait: Secondary | ICD-10-CM | POA: Diagnosis present

## 2020-05-28 DIAGNOSIS — Z9221 Personal history of antineoplastic chemotherapy: Secondary | ICD-10-CM

## 2020-05-28 DIAGNOSIS — F32A Depression, unspecified: Secondary | ICD-10-CM | POA: Diagnosis present

## 2020-05-28 DIAGNOSIS — I639 Cerebral infarction, unspecified: Secondary | ICD-10-CM | POA: Diagnosis not present

## 2020-05-28 DIAGNOSIS — Z6829 Body mass index (BMI) 29.0-29.9, adult: Secondary | ICD-10-CM

## 2020-05-28 DIAGNOSIS — I422 Other hypertrophic cardiomyopathy: Secondary | ICD-10-CM

## 2020-05-28 DIAGNOSIS — Z91128 Patient's intentional underdosing of medication regimen for other reason: Secondary | ICD-10-CM | POA: Diagnosis not present

## 2020-05-28 DIAGNOSIS — T465X6A Underdosing of other antihypertensive drugs, initial encounter: Secondary | ICD-10-CM | POA: Diagnosis present

## 2020-05-28 DIAGNOSIS — E785 Hyperlipidemia, unspecified: Secondary | ICD-10-CM | POA: Diagnosis present

## 2020-05-28 DIAGNOSIS — I10 Essential (primary) hypertension: Secondary | ICD-10-CM | POA: Diagnosis present

## 2020-05-28 DIAGNOSIS — R270 Ataxia, unspecified: Secondary | ICD-10-CM

## 2020-05-28 DIAGNOSIS — G8191 Hemiplegia, unspecified affecting right dominant side: Secondary | ICD-10-CM | POA: Diagnosis present

## 2020-05-28 DIAGNOSIS — I161 Hypertensive emergency: Secondary | ICD-10-CM | POA: Diagnosis present

## 2020-05-28 DIAGNOSIS — R29898 Other symptoms and signs involving the musculoskeletal system: Secondary | ICD-10-CM | POA: Diagnosis not present

## 2020-05-28 DIAGNOSIS — E663 Overweight: Secondary | ICD-10-CM | POA: Diagnosis present

## 2020-05-28 DIAGNOSIS — I6381 Other cerebral infarction due to occlusion or stenosis of small artery: Secondary | ICD-10-CM | POA: Diagnosis present

## 2020-05-28 DIAGNOSIS — F329 Major depressive disorder, single episode, unspecified: Secondary | ICD-10-CM | POA: Diagnosis present

## 2020-05-28 DIAGNOSIS — D1802 Hemangioma of intracranial structures: Secondary | ICD-10-CM | POA: Diagnosis present

## 2020-05-28 DIAGNOSIS — I361 Nonrheumatic tricuspid (valve) insufficiency: Secondary | ICD-10-CM | POA: Diagnosis not present

## 2020-05-28 DIAGNOSIS — Z87891 Personal history of nicotine dependence: Secondary | ICD-10-CM

## 2020-05-28 HISTORY — DX: Major depressive disorder, single episode, unspecified: F32.9

## 2020-05-28 LAB — CBC WITH DIFFERENTIAL/PLATELET
Abs Immature Granulocytes: 0.02 10*3/uL (ref 0.00–0.07)
Basophils Absolute: 0.1 10*3/uL (ref 0.0–0.1)
Basophils Relative: 2 %
Eosinophils Absolute: 0.2 10*3/uL (ref 0.0–0.5)
Eosinophils Relative: 3 %
HCT: 45.3 % (ref 36.0–46.0)
Hemoglobin: 14.4 g/dL (ref 12.0–15.0)
Immature Granulocytes: 0 %
Lymphocytes Relative: 36 %
Lymphs Abs: 2.1 10*3/uL (ref 0.7–4.0)
MCH: 28.6 pg (ref 26.0–34.0)
MCHC: 31.8 g/dL (ref 30.0–36.0)
MCV: 90.1 fL (ref 80.0–100.0)
Monocytes Absolute: 0.5 10*3/uL (ref 0.1–1.0)
Monocytes Relative: 8 %
Neutro Abs: 3 10*3/uL (ref 1.7–7.7)
Neutrophils Relative %: 51 %
Platelets: 322 10*3/uL (ref 150–400)
RBC: 5.03 MIL/uL (ref 3.87–5.11)
RDW: 13.4 % (ref 11.5–15.5)
WBC: 5.9 10*3/uL (ref 4.0–10.5)
nRBC: 0 % (ref 0.0–0.2)

## 2020-05-28 LAB — BASIC METABOLIC PANEL
Anion gap: 8 (ref 5–15)
BUN: 15 mg/dL (ref 8–23)
CO2: 26 mmol/L (ref 22–32)
Calcium: 10.2 mg/dL (ref 8.9–10.3)
Chloride: 105 mmol/L (ref 98–111)
Creatinine, Ser: 0.86 mg/dL (ref 0.44–1.00)
GFR calc Af Amer: >60 mL/min (ref >60)
GFR calc non Af Amer: >60 mL/min (ref >60)
Glucose, Bld: 102 mg/dL — ABNORMAL HIGH (ref 70–99)
Potassium: 4 mmol/L (ref 3.5–5.1)
Sodium: 139 mmol/L (ref 135–145)

## 2020-05-28 LAB — MRSA PCR SCREENING: MRSA by PCR: NEGATIVE

## 2020-05-28 LAB — TROPONIN I (HIGH SENSITIVITY)
Troponin I (High Sensitivity): 5 ng/L (ref <18)
Troponin I (High Sensitivity): 4 ng/L (ref <18)
Troponin I (High Sensitivity): 3 ng/L (ref <18)

## 2020-05-28 LAB — SARS CORONAVIRUS 2 BY RT PCR (HOSPITAL ORDER, PERFORMED IN ~~LOC~~ HOSPITAL LAB): SARS Coronavirus 2: NEGATIVE

## 2020-05-28 MED ORDER — ACETAMINOPHEN 160 MG/5ML PO SOLN: 650.0000 mg | ORAL | Status: DC | PRN

## 2020-05-28 MED ORDER — ONDANSETRON HCL 4 MG/2ML IJ SOLN: 4.0000 mg | Freq: Four times a day (QID) | INTRAMUSCULAR | Status: DC | PRN

## 2020-05-28 MED ORDER — STROKE: EARLY STAGES OF RECOVERY BOOK
Freq: Once | Status: AC
  Filled 2020-05-28: qty 1

## 2020-05-28 MED ORDER — ACETAMINOPHEN 325 MG PO TABS
650.0000 mg | ORAL_TABLET | ORAL | Status: DC | PRN
  Administered 2020-05-29 – 2020-05-30 (×3): 650 mg via ORAL
  Filled 2020-05-28 (×3): qty 2

## 2020-05-28 MED ORDER — NITROGLYCERIN IN D5W 200-5 MCG/ML-% IV SOLN: 0.0000 ug/min | INTRAVENOUS | Status: DC

## 2020-05-28 MED ORDER — POLYETHYLENE GLYCOL 3350 17 G PO PACK: 17.0000 g | PACK | Freq: Every day | ORAL | Status: DC | PRN

## 2020-05-28 MED ORDER — SODIUM CHLORIDE 0.9 % IV BOLUS
500.0000 mL | Freq: Once | INTRAVENOUS | Status: AC
  Administered 2020-05-28: 500 mL via INTRAVENOUS

## 2020-05-28 MED ORDER — NITROGLYCERIN IN D5W 200-5 MCG/ML-% IV SOLN
INTRAVENOUS | Status: AC
  Filled 2020-05-28: qty 250

## 2020-05-28 MED ORDER — HYDRALAZINE HCL 25 MG PO TABS
25.0000 mg | ORAL_TABLET | Freq: Once | ORAL | Status: AC
  Administered 2020-05-28: 25 mg via ORAL
  Filled 2020-05-28: qty 1

## 2020-05-28 MED ORDER — ACETAMINOPHEN 650 MG RE SUPP
650.0000 mg | RECTAL | Status: DC | PRN
  Filled 2020-05-28: qty 1

## 2020-05-28 MED ORDER — SERTRALINE HCL 25 MG PO TABS
75.0000 mg | ORAL_TABLET | Freq: Every day | ORAL | Status: DC
  Administered 2020-05-29 – 2020-05-30 (×2): 75 mg via ORAL
  Filled 2020-05-28 (×2): qty 3

## 2020-05-28 NOTE — ED Notes (Signed)
Pt ambulated to RR with minimal assistance

## 2020-05-28 NOTE — Telephone Encounter (Signed)
Erin Cunningham called the oncall [facility] service to report that she had been to the ER due to high blood pressure, not feeling well, and dizziness. She stated she signed herself out because she refused to wait that long. She reported to me that her BP was 201/102 and that she did not feel well. I let her know that this was dangerously high putting her at risk for stroke or MI. She asked if she could make an apt with the NP and I stated that this was urgent and reiterated that she needed to proceed to the ER and then she hung up.

## 2020-05-28 NOTE — Telephone Encounter (Signed)
I agree with you, pt needs to be seen in the Emergency Room for a full evaluation.

## 2020-05-28 NOTE — Care Plan (Signed)
78 year old lady presents to [facility] ED for ataxia for one week, along with hypertensive urgency.  Initial CT head negative for acute stroke or intracranial bleed.  Will need admission to progressive unit for evaluation of stroke and management of hypertensive urgency.  Will need NEUROLOGY EVALUATION on arrival to [facility].    Hosie Poisson, MD

## 2020-05-28 NOTE — Telephone Encounter (Signed)
Patient called and states that her BP is High and she's having dizziness. Patient states that she was seen in the ER yesterday. After reviewing notes patient left when labs were going to be drawn and patient also refused to wait in the ER as well. Patient is demanding that she be seen today. I told patient that schedule is full and that ER would be the best route. Patient refused and threaten to come into the office until someone will see her. Patient also states we have 5 minutes to reply to her needs. Patient had slurred speech and was very short tempered on the phone. Please Advise.

## 2020-05-28 NOTE — ED Notes (Signed)
Patient transported to CT 

## 2020-05-28 NOTE — Telephone Encounter (Signed)
I called patient back and told her that she needs to be seen by the ED again. Patient said 'Ok" and Buck[place] the phone up.

## 2020-05-28 NOTE — ED Notes (Signed)
Nitro gtt given to [employer] 

## 2020-05-28 NOTE — ED Provider Notes (Signed)
[facility] EMERGENCY DEPARTMENT Provider Note   CSN: [id number] Arrival date & time: 05/28/20  0973     History Chief Complaint  Patient presents with  . Hypertension   Erin Cunningham is a 79 y.o. female.  Presents to ER with concern for dizziness, difficulty walking.  Patient also concerned about elevated blood pressure.  Patient has been feeling off for the last couple days, has had intermittent episodes of lightheadedness/dizziness.  No room spinning sensation.  Also states that she has been having a hard time walking without assistance.  Normally is very active independent person.  Reports history of hypertension, has been compliant with her medications.  Went to [employer] yesterday but left due to the weight.  Resting in bed, she denies any ongoing symptoms.  Specifically denies any chest pain or difficulty in breathing.  Has had mild dull headache.  HPI     Past Medical History:  Diagnosis Date  . Breast CA ([place]) 2006   right [facility])  . Cancer ([place])   . Helicobacter pylori gastritis 2001  . High blood pressure   . History of adenomatous polyp of colon 09/23/2017   Oma 09/25/2017 and apparently polyps in the past as well though recalled age  . History of shingles   . Major depressive disorder, recurrent, severe without psychotic features ([place]) 06/24/2015  . Parotid mass 2014   right benign cyst  . Personal history of radiation therapy 2006   Rt breast  . Wears glasses     Patient Active Problem List   Diagnosis Date Noted  . Hypertensive urgency 05/28/2020  . Allergic rhinitis 06/16/2018  . Chewing tobacco nicotine dependence with nicotine-induced disorder 06/16/2018  . History of adenomatous polyp of colon 09/23/2017  . Hypercalcemia 04/20/2016  . Neck nodule 04/20/2016  . Essential hypertension, benign 07/21/2015  . Hyperlipidemia LDL goal <130 07/21/2015  . Abnormal fasting glucose 07/21/2015  . History of breast cancer in female 07/21/2015  .  Major depressive disorder, recurrent, severe without psychotic features ([place]) 06/24/2015  . MDD (major depressive disorder), recurrent episode, severe ([place]) 06/24/2015  . Onychomycosis 06/12/2014  . Pain in lower limb 06/12/2014    Past Surgical History:  Procedure Laterality Date  . ABDOMINAL HYSTERECTOMY  1982   BSO  . BREAST LUMPECTOMY Right 2007  . BREAST LUMPECTOMY WITH SENTINEL LYMPH NODE BIOPSY  2005   right  . BREAST SURGERY  2004   rt br mass  . CESAREAN SECTION    . COLONOSCOPY    . ESOPHAGOGASTRODUODENOSCOPY  2001   H pylori gastritis  . EYE SURGERY     both cataracts  . PAROTIDECTOMY Right 09/05/2013   Procedure: RIGHT SUPERFICIAL PAROTIDECTOMY WITH FACIAL NERVE DISECTION;  Surgeon: [name], MD;  Location: [facility];  Service: ENT;  Laterality: Right;  All documentation done by R.Ward after [phone] --done under G. Garzon's password  . RE-EXCISION OF BREAST LUMPECTOMY  2005   right     OB History   No obstetric history on file.     Family History  Problem Relation Age of Onset  . Heart disease Mother        MI  . Cancer Father        bone  . Thyroid disease Neg Hx   . Hypercalcemia Neg Hx   . Colon cancer Neg Hx   . Stomach cancer Neg Hx   . Rectal cancer Neg Hx   . Esophageal cancer Neg Hx  Social History   Tobacco Use  . Smoking status: Never Smoker  . Smokeless tobacco: Former Systems developer    Types: Secondary school teacher  . Vaping Use: Never used  Substance Use Topics  . Alcohol use: Yes    Alcohol/week: 0.0 standard drinks    Comment: occ  . Drug use: No    Home Medications Prior to Admission medications   Medication Sig Start Date End Date Taking? Authorizing Provider  Calcium Carbonate-Vitamin D (CALTRATE 600+D PO) Take one by mouth twice daily.    [provider]  losartan (COZAAR) 100 MG tablet Take 1 tablet (100 mg total) by mouth daily. 01/15/20   Lauree Chandler, NP  Multiple Vitamins-Minerals (CENTRUM  MULTIGUMMIES) CHEW Chew 2 tablets by mouth daily.    [provider]  sertraline (ZOLOFT) 50 MG tablet Take 1.5 tablets (75 mg total) by mouth daily. 01/15/20   Arfeen, Arlyce Harman, MD  traZODone (DESYREL) 50 MG tablet Take 1 tablet (50 mg total) by mouth at bedtime as needed for sleep. 11/13/19   Arfeen, Arlyce Harman, MD    Allergies    Patient has no known allergies.  Review of Systems   Review of Systems  Constitutional: Negative for chills and fever.  HENT: Negative for ear pain and sore throat.   Eyes: Negative for pain and visual disturbance.  Respiratory: Negative for cough and shortness of breath.   Cardiovascular: Negative for chest pain and palpitations.  Gastrointestinal: Negative for abdominal pain and vomiting.  Genitourinary: Negative for dysuria and hematuria.  Musculoskeletal: Negative for arthralgias and back pain.  Skin: Negative for color change and rash.  Neurological: Positive for dizziness and headaches. Negative for seizures and syncope.  All other systems reviewed and are negative.   Physical Exam Updated Vital Signs BP (!) 226/95 (BP Location: Left Arm)   Pulse 61   Temp 98.3 F (36.8 C) (Oral)   Resp 18   Ht 5\' 3"  (1.6 m)   Wt 77.1 kg   SpO2 97%   BMI 30.11 kg/m   Physical Exam Vitals and nursing note reviewed.  Constitutional:      General: She is not in acute distress.    Appearance: She is well-developed.  HENT:     Head: Normocephalic and atraumatic.  Eyes:     Conjunctiva/sclera: Conjunctivae normal.  Cardiovascular:     Rate and Rhythm: Normal rate and regular rhythm.     Heart sounds: No murmur heard.   Pulmonary:     Effort: Pulmonary effort is normal. No respiratory distress.     Breath sounds: Normal breath sounds.  Abdominal:     Palpations: Abdomen is soft.     Tenderness: There is no abdominal tenderness.  Musculoskeletal:     Cervical back: Neck supple.  Skin:    General: Skin is warm and dry.     Capillary Refill:  Capillary refill takes less than 2 seconds.  Neurological:     General: No focal deficit present.     Mental Status: She is alert and oriented to person, place, and time.     Comments: Somewhat ataxic gait  Cranial nerves II through XII intact, 5 out of 5 strength in bilateral upper and lower extremities, normal finger-nose-finger  Psychiatric:        Mood and Affect: Mood normal.        Behavior: Behavior normal.     ED Results / Procedures / Treatments   Labs (all labs ordered are  listed, but only abnormal results are displayed) Labs Reviewed  BASIC METABOLIC PANEL - Abnormal; Notable for the following components:      Result Value   Glucose, Bld 102 (*)    All other components within normal limits  SARS CORONAVIRUS 2 BY RT PCR (HOSPITAL ORDER, [place] [facility])  CBC WITH DIFFERENTIAL/PLATELET  TROPONIN I (HIGH SENSITIVITY)  TROPONIN I (HIGH SENSITIVITY)    EKG None    EKG performed on June 22 at 12:44 PM demonstrates normal sinus rhythm with rate 57, no ST segment or T wave changes, normal axis and normal intervals.  Radiology CT Head Wo Contrast  Result Date: 05/28/2020 CLINICAL DATA:  Headache, acute, normal neuro exam. Additional provided: Dizziness, off balance for 1 week, elevated blood pressure. EXAM: CT HEAD WITHOUT CONTRAST TECHNIQUE: Contiguous axial images were obtained from the base of the skull through the vertex without intravenous contrast. COMPARISON:  Head CT 02/28/2015, brain MRI 03/23/2005, head CT 02/27/2005 FINDINGS: Brain: As before, there is slight prominence of the lateral ventricles. However, the temporal horns remain normal in size and there is no convincing evidence of hydrocephalus. Mild patchy hypoattenuation within the cerebral white matter is nonspecific, but consistent with chronic small vessel ischemic disease. Findings have progressed as compared to prior head CT 02/28/2015. Redemonstrated faint hyperdensity within the right  frontal lobe at site of a known cavernoma, previously demonstrated on MRI 03/23/2005. There is no acute intracranial hemorrhage. No demarcated cortical infarct. No extra-axial fluid collection. No evidence of intracranial mass. No midline shift. Vascular: No convincing hyperdense vessel. Skull: Normal. Negative for fracture or focal lesion. Sinuses/Orbits: The orbits are largely excluded from the field of view. No evidence of acute orbital abnormality. Partially imaged mucosal thickening within the ethmoid and maxillary sinuses. Partially imaged frothy secretions within the right maxillary sinus. No significant mastoid effusion. IMPRESSION: No evidence of acute intracranial abnormality. Mild chronic small vessel ischemic changes within the cerebral white matter, progressed as compared to head CT 02/28/2015. Redemonstrated hyperdensity in the region of a known right frontal lobe cavernoma. Paranasal sinus disease as described. Correlate for acute sinusitis. Electronically Signed   By: Kellie Simmering DO   On: 05/28/2020 14:12    Procedures Procedures (including critical care time)  Medications Ordered in ED Medications  sodium chloride 0.9 % bolus 500 mL (0 mLs Intravenous Stopped 05/28/20 1406)  hydrALAZINE (APRESOLINE) tablet 25 mg (25 mg Oral Given 05/28/20 1646)    ED Course  I have reviewed the triage vital signs and the nursing notes.  Pertinent labs & imaging results that were available during my care of the patient were reviewed by me and considered in my medical decision making (see chart for details).    MDM Rules/Calculators/A&P                          79 year old lady presenting to ER with episodes of lightheadedness, occasional headache, hypertension.  On exam she is well-appearing, no focal neurologic deficits noted except patient noted to have somewhat ataxic gait requiring assistance with ambulation.  CT head negative, labs grossly than normal limits, EKG without acute changes.  Given  her ongoing hypertension, gait abnormality, believe patient would benefit from MRI, possibly neuro consult, PT/OT eval.  Will treat with oral hydralazine for now.  Consulted hospitalist for admission.  Dr. Karleen Hampshire will admit.  Final Clinical Impression(s) / ED Diagnoses Final diagnoses:  Ataxia  Hypertension, unspecified type  Rx / DC Orders ED Discharge Orders    None       Lucrezia Starch, MD 05/28/20 1745

## 2020-05-28 NOTE — ED Notes (Signed)
Tried to ambulate patient from room to the bathroom, she tend to lean to her right side.  She stated that she felt weak.  She did verbalized that she was dizzy when I get her up which subsided.

## 2020-05-28 NOTE — H&P (Signed)
History and Physical    Erin Cunningham TMA:[id number] DOB: 23-Sep-1941 DOA: 05/28/2020  PCP: Lauree Chandler, NP  Patient coming from: Transfer from [facility] to SDU   Chief Complaint:  Chief Complaint  Patient presents with   Hypertension     HPI:    79 year old female with past medical history of hypertension, generalized anxiety disorder, depression, breast cancer who presents to [facility] stepdown unit as a transfer from [place] for hypertensive emergency and concern for acute stroke.  Patient explains that 1 week ago she suddenly began to experience right lower extremity weakness and difficulty with ambulation with unsteady gait.  Patient denies any associated pain, changes in vision, slurring of speech or facial droop.  Patient states that this weakness lasted for approximately 15 minutes and spontaneously resolved.  Patient states that several days later she had a similar episode with almost identical symptoms, lasting again for approximately 15 minutes.  Patient explains that she has checked her blood pressure several times over the past week and it has been elevated with blood pressures typically around 256-389 systolic according to a blood pressure diary that she has provided to me during the interview.  Patient explains that she is noncompliant with her home regimen of losartan.  Patient complains that on Monday she was at work when she suddenly "could not move" shortly after the onset of sudden diffuse weakness.  Patient also explains that she had a brief period of loss of consciousness as well without any associated seizure activity, self urination, self defecation or tongue biting.  EMS came to assess patient brought the patient to [facility] for evaluation where she left without being seen after a long wait.  Patient explains that overnight, the patient has experienced recurrent right lower extremity weakness however this weakness lasted almost  the entire evening, substantially worsened compared to her prior episodes.  Again, patient denies visual changes, slurring of speech or facial droop.  Patient returned to [place] emergency department for evaluation on 6/22 where upon initial evaluation patient has been found to have blood pressures as high as 226/95.  A nitroglycerin infusion was initiated.  Due to concerns for hypertensive emergency with possible concurrent stroke the hospitalist group has been called and have accepted the patient as a transfer to [facility] stepdown unit.  Review of Systems: A 10-system review of systems has been performed and all systems are negative with the exception of what is listed in the HPI.  Past Medical History:  Diagnosis Date   Breast CA ([place]) 2006   right ([place])   Cancer ([place])    Helicobacter pylori gastritis 2001   High blood pressure    History of adenomatous polyp of colon 09/23/2017   Oma 09/25/2017 and apparently polyps in the past as well though recalled age   History of shingles    Major depressive disorder 05/28/2020   Major depressive disorder, recurrent, severe without psychotic features ([place]) 06/24/2015   Parotid mass 2014   right benign cyst   Personal history of radiation therapy 2006   Rt breast   Wears glasses     Past Surgical History:  Procedure Laterality Date   ABDOMINAL HYSTERECTOMY  1982   BSO   BREAST LUMPECTOMY Right 2007   BREAST LUMPECTOMY WITH SENTINEL LYMPH NODE BIOPSY  2005   right   BREAST SURGERY  2004   rt br mass   CESAREAN SECTION     COLONOSCOPY  ESOPHAGOGASTRODUODENOSCOPY  2001   H pylori gastritis   EYE SURGERY     both cataracts   PAROTIDECTOMY Right 09/05/2013   Procedure: RIGHT SUPERFICIAL PAROTIDECTOMY WITH FACIAL NERVE DISECTION;  Surgeon: [name], MD;  Location: [facility];  Service: ENT;  Laterality: Right;  All documentation done by R.Ward after [phone] --done under  G. Garzon's password   RE-EXCISION OF BREAST LUMPECTOMY  2005   right     reports that she has never smoked. She quit smokeless tobacco use about 2 years ago.  Her smokeless tobacco use included chew. She reports current alcohol use. She reports that she does not use drugs.  No Known Allergies  Family History  Problem Relation Age of Onset   Heart disease Mother        MI   Cancer Father        bone   Thyroid disease Neg Hx    Hypercalcemia Neg Hx    Colon cancer Neg Hx    Stomach cancer Neg Hx    Rectal cancer Neg Hx    Esophageal cancer Neg Hx      Prior to Admission medications   Medication Sig Start Date End Date Taking? Authorizing Provider  Calcium Carbonate-Vitamin D (CALTRATE 600+D PO) Take 1 tablet by mouth in the morning and at bedtime. Take one by mouth twice daily.    Yes [provider]  losartan (COZAAR) 100 MG tablet Take 1 tablet (100 mg total) by mouth daily. 01/15/20  Yes Lauree Chandler, NP  Multiple Vitamins-Minerals (CENTRUM MULTIGUMMIES) CHEW Chew 2 tablets by mouth daily.   Yes [provider]  sertraline (ZOLOFT) 50 MG tablet Take 1.5 tablets (75 mg total) by mouth daily. 01/15/20  Yes Arfeen, Arlyce Harman, MD  traZODone (DESYREL) 50 MG tablet Take 1 tablet (50 mg total) by mouth at bedtime as needed for sleep. Patient not taking: Reported on 05/28/2020 11/13/19   [name], MD    Physical Exam: Vitals:   05/28/20 1259 05/28/20 2017 05/28/20 2100 05/28/20 2130  BP: (!) 226/95  (!) 154/92 139/80  Pulse: 61  72 69  Resp: 18  16 20   Temp: 98.3 F (36.8 C) 98.6 F (37 C)    TempSrc: Oral Oral    SpO2: 97%     Weight:  75.3 kg    Height:  5\' 3"  (1.6 m)      Constitutional: Acute alert and oriented x3, no associated distress.   Skin: no rashes, no lesions, good skin turgor noted. Eyes: Pupils are equally reactive to light.  No evidence of scleral icterus or conjunctival pallor.  ENMT: Moist mucous membranes noted.  Posterior  pharynx clear of any exudate or lesions.   Neck: normal, supple, no masses, no thyromegaly.  No evidence of jugular venous distension.   Respiratory: clear to auscultation bilaterally, no wheezing, no crackles. Normal respiratory effort. No accessory muscle use.  Cardiovascular: Regular rate and rhythm, no murmurs / rubs / gallops. No extremity edema. 2+ pedal pulses. No carotid bruits.  Chest:   Nontender without crepitus or deformity.   Back:   Nontender without crepitus or deformity. Abdomen: Abdomen is soft and nontender.  No evidence of intra-abdominal masses.  Positive bowel sounds noted in all quadrants.   Musculoskeletal: No joint deformity upper and lower extremities. Good ROM, no contractures. Normal muscle tone.  Neurologic: CN 2-12 grossly intact. Sensation intact, strength noted to be 5 out of 5 in all 4  extremities.  Patient is following all commands.  Patient is responsive to verbal stimuli.   Psychiatric: Patient presents as a normal mood with appropriate affect.  Patient seems to possess insight as to theircurrent situation.     Labs on Admission: I have personally reviewed following labs and imaging studies -   CBC: Recent Labs  Lab 05/28/20 1201  WBC 5.9  NEUTROABS 3.0  HGB 14.4  HCT 45.3  MCV 90.1  PLT 967   Basic Metabolic Panel: Recent Labs  Lab 05/28/20 1201  NA 139  K 4.0  CL 105  CO2 26  GLUCOSE 102*  BUN 15  CREATININE 0.86  CALCIUM 10.2   GFR: Estimated Creatinine Clearance: 52.4 mL/min (by C-G formula based on SCr of 0.86 mg/dL). Liver Function Tests: No results for input(s): AST, ALT, ALKPHOS, BILITOT, PROT, ALBUMIN in the last 168 hours. No results for input(s): LIPASE, AMYLASE in the last 168 hours. No results for input(s): AMMONIA in the last 168 hours. Coagulation Profile: No results for input(s): INR, PROTIME in the last 168 hours. Cardiac Enzymes: No results for input(s): CKTOTAL, CKMB, CKMBINDEX, TROPONINI in the last 168 hours. BNP  (last 3 results) No results for input(s): PROBNP in the last 8760 hours. HbA1C: No results for input(s): HGBA1C in the last 72 hours. CBG: No results for input(s): GLUCAP in the last 168 hours. Lipid Profile: No results for input(s): CHOL, HDL, LDLCALC, TRIG, CHOLHDL, LDLDIRECT in the last 72 hours. Thyroid Function Tests: No results for input(s): TSH, T4TOTAL, FREET4, T3FREE, THYROIDAB in the last 72 hours. Anemia Panel: No results for input(s): VITAMINB12, FOLATE, FERRITIN, TIBC, IRON, RETICCTPCT in the last 72 hours. Urine analysis:    Component Value Date/Time   COLORURINE DARK YELLOW 06/13/2018 0910   APPEARANCEUR CLOUDY (A) 06/13/2018 0910   APPEARANCEUR Cloudy (A) 02/12/2015 0907   LABSPEC 1.025 06/13/2018 0910   PHURINE 5.5 06/13/2018 0910   GLUCOSEU NEGATIVE 06/13/2018 0910   HGBUR NEGATIVE 06/13/2018 0910   BILIRUBINUR Negative 02/12/2015 0907   KETONESUR NEGATIVE 06/13/2018 0910   PROTEINUR TRACE (A) 06/13/2018 0910   NITRITE NEGATIVE 06/13/2018 0910   LEUKOCYTESUR 3+ (A) 06/13/2018 0910   LEUKOCYTESUR 3+ (A) 02/12/2015 0907    Radiological Exams on Admission - Personally Reviewed: CT Head Wo Contrast  Result Date: 05/28/2020 CLINICAL DATA:  Headache, acute, normal neuro exam. Additional provided: Dizziness, off balance for 1 week, elevated blood pressure. EXAM: CT HEAD WITHOUT CONTRAST TECHNIQUE: Contiguous axial images were obtained from the base of the skull through the vertex without intravenous contrast. COMPARISON:  Head CT 02/28/2015, brain MRI 03/23/2005, head CT 02/27/2005 FINDINGS: Brain: As before, there is slight prominence of the lateral ventricles. However, the temporal horns remain normal in size and there is no convincing evidence of hydrocephalus. Mild patchy hypoattenuation within the cerebral white matter is nonspecific, but consistent with chronic small vessel ischemic disease. Findings have progressed as compared to prior head CT 02/28/2015.  Redemonstrated faint hyperdensity within the right frontal lobe at site of a known cavernoma, previously demonstrated on MRI 03/23/2005. There is no acute intracranial hemorrhage. No demarcated cortical infarct. No extra-axial fluid collection. No evidence of intracranial mass. No midline shift. Vascular: No convincing hyperdense vessel. Skull: Normal. Negative for fracture or focal lesion. Sinuses/Orbits: The orbits are largely excluded from the field of view. No evidence of acute orbital abnormality. Partially imaged mucosal thickening within the ethmoid and maxillary sinuses. Partially imaged frothy secretions within the right maxillary sinus. No significant mastoid  effusion. IMPRESSION: No evidence of acute intracranial abnormality. Mild chronic small vessel ischemic changes within the cerebral white matter, progressed as compared to head CT 02/28/2015. Redemonstrated hyperdensity in the region of a known right frontal lobe cavernoma. Paranasal sinus disease as described. Correlate for acute sinusitis. Electronically Signed   By: Kellie Simmering DO   On: 05/28/2020 14:12    Telemetry: Personally reviewed.  Rhythm is NSR at 69BPM.     Assessment/Plan Principal Problem:   Hypertensive emergency   Patient with longstanding history of hypertension medication noncompliance presenting with severe hypertension and possible hypertensive emergency with concurrent right lower extremity weakness and unsteady gait concerning for concurrent stroke  Patient's right lower extremity weakness however seems to have resolved upon arrival to the stepdown unit  Patient will be managed as a hypertensive emergency with possible concurrent small stroke or possibly a TIA  MRI brain without contrast ordered  Serial neurologic checks ordered  Nitroglycerin infusion at the patient was started on at [facility] will be continued for now -target blood pressure overnight of 180-200  If acute stroke is identified  on MRI will obtain neurology consultation  PT/OT and SLP evaluations ordered  Echocardiogram ordered for the morning  Monitoring patient on telemetry  Obtaining serial cardiac enzymes  Once patient is transitioned off nitroglycerin drip, likely in the morning, patient can likely be transferred out of the stepdown unit.  Active Problems:   Weakness of right lower extremity   Possible stroke or TIA, see assessment and plan above.    Major depressive disorder  Continue home regimen of psychotropic medication regimen    Code Status:  Full code Family Communication: deferred   Status is: Inpatient  Remains inpatient appropriate because:Inpatient level of care appropriate due to severity of illness   Dispo: The patient is from: Home              Anticipated d/c is to: Home              Anticipated d/c date is: 3 days              Patient currently is not medically stable to d/c.        Vernelle Emerald MD [facility] Pager [phone]  If 7PM-7AM, please contact night-coverage [email or web address] Use universal [place] password for that web site. If you do not have the password, please call the hospital operator.  05/28/2020, 10:11 PM

## 2020-05-28 NOTE — ED Triage Notes (Signed)
Pt reports elevated BP since yesterday; sts she went to [facility], but left d/t wait time; sts her balance is off for about a week; denies falls

## 2020-05-29 ENCOUNTER — Inpatient Hospital Stay (HOSPITAL_COMMUNITY): Payer: BC Managed Care – PPO

## 2020-05-29 DIAGNOSIS — F419 Anxiety disorder, unspecified: Secondary | ICD-10-CM

## 2020-05-29 DIAGNOSIS — I639 Cerebral infarction, unspecified: Secondary | ICD-10-CM | POA: Diagnosis present

## 2020-05-29 DIAGNOSIS — I422 Other hypertrophic cardiomyopathy: Secondary | ICD-10-CM

## 2020-05-29 DIAGNOSIS — I361 Nonrheumatic tricuspid (valve) insufficiency: Secondary | ICD-10-CM

## 2020-05-29 DIAGNOSIS — F32A Depression, unspecified: Secondary | ICD-10-CM | POA: Diagnosis present

## 2020-05-29 LAB — LIPID PANEL
Cholesterol: 211 mg/dL — ABNORMAL HIGH (ref 0–200)
HDL: 44 mg/dL (ref >40)
LDL Cholesterol: 145 mg/dL — ABNORMAL HIGH (ref 0–99)
Total CHOL/HDL Ratio: 4.8 RATIO
Triglycerides: 111 mg/dL (ref <150)
VLDL: 22 mg/dL (ref 0–40)

## 2020-05-29 LAB — HEMOGLOBIN A1C
Hgb A1c MFr Bld: 5.6 % (ref 4.8–5.6)
Mean Plasma Glucose: 114.02 mg/dL

## 2020-05-29 MED ORDER — ASPIRIN EC 81 MG PO TBEC
81.0000 mg | DELAYED_RELEASE_TABLET | Freq: Every day | ORAL | Status: DC
  Administered 2020-05-30: 81 mg via ORAL
  Filled 2020-05-29: qty 1

## 2020-05-29 MED ORDER — CLOPIDOGREL BISULFATE 75 MG PO TABS
75.0000 mg | ORAL_TABLET | Freq: Every day | ORAL | Status: DC
  Administered 2020-05-30: 75 mg via ORAL
  Filled 2020-05-29: qty 1

## 2020-05-29 MED ORDER — ATORVASTATIN CALCIUM 80 MG PO TABS
80.0000 mg | ORAL_TABLET | Freq: Every day | ORAL | Status: DC
  Administered 2020-05-29 – 2020-05-30 (×2): 80 mg via ORAL
  Filled 2020-05-29 (×3): qty 1

## 2020-05-29 MED ORDER — LOSARTAN POTASSIUM 50 MG PO TABS
50.0000 mg | ORAL_TABLET | Freq: Every day | ORAL | Status: DC
  Administered 2020-05-29: 50 mg via ORAL
  Filled 2020-05-29: qty 1

## 2020-05-29 MED ORDER — CARVEDILOL 3.125 MG PO TABS
3.1250 mg | ORAL_TABLET | Freq: Two times a day (BID) | ORAL | Status: DC
  Administered 2020-05-29 – 2020-05-30 (×2): 3.125 mg via ORAL
  Filled 2020-05-29 (×2): qty 1

## 2020-05-29 MED ORDER — ASPIRIN 325 MG PO TABS
325.0000 mg | ORAL_TABLET | Freq: Every day | ORAL | Status: DC
  Administered 2020-05-29: 325 mg via ORAL
  Filled 2020-05-29: qty 1

## 2020-05-29 MED ORDER — CLOPIDOGREL BISULFATE 300 MG PO TABS
300.0000 mg | ORAL_TABLET | Freq: Once | ORAL | Status: AC
  Administered 2020-05-29: 300 mg via ORAL
  Filled 2020-05-29: qty 1

## 2020-05-29 MED ORDER — IOHEXOL 350 MG/ML SOLN
75.0000 mL | Freq: Once | INTRAVENOUS | Status: AC | PRN
  Administered 2020-05-29: 75 mL via INTRAVENOUS

## 2020-05-29 NOTE — Plan of Care (Signed)
  Problem: Education: Goal: Knowledge of General Education information will improve Description: Including pain rating scale, medication(s)/side effects and non-pharmacologic comfort measures Outcome: Progressing   Problem: Health Behavior/Discharge Planning: Goal: Ability to manage health-related needs will improve Outcome: Progressing   Problem: Clinical Measurements: Goal: Ability to maintain clinical measurements within normal limits will improve Outcome: Progressing Goal: Will remain free from infection Outcome: Progressing   Problem: Activity: Goal: Risk for activity intolerance will decrease Outcome: Progressing   Problem: Nutrition: Goal: Adequate nutrition will be maintained Outcome: Progressing   Problem: Coping: Goal: Level of anxiety will decrease Outcome: Progressing   Problem: Safety: Goal: Ability to remain free from injury will improve Outcome: Progressing   Problem: Skin Integrity: Goal: Risk for impaired skin integrity will decrease Outcome: Progressing   Problem: Education: Goal: Knowledge of disease or condition will improve Outcome: Progressing Goal: Knowledge of secondary prevention will improve Outcome: Progressing Goal: Knowledge of patient specific risk factors addressed and post discharge goals established will improve Outcome: Progressing   Problem: Coping: Goal: Will verbalize positive feelings about self Outcome: Progressing

## 2020-05-29 NOTE — Progress Notes (Signed)
PROGRESS NOTE  Erin Cunningham TML:[id number] DOB: [birth year]   PCP: Lauree Chandler, NP  Patient is from: Home  DOA: 05/28/2020 LOS: 1  Brief Narrative / Interim history: 79 year old female with history of HTN, GAD, depression and breast cancer s/p chemoradiation presented to [facility] ED with elevated blood pressure and progressive recurrent right-sided weakness for about a week.   In ED, hypertensive to 226/95.  CT head and CTA head and neck without acute finding.  MRI brain with left basal ganglia/putamen infarct and right frontal lobe cavernous angioma.  Patient was started on nitro drip and admitted to [facility] for hypertensive emergency and acute CVA.  Subjective: Seen and examined earlier this morning.  No major events overnight or this morning.  Complains headache.  No further right-sided weakness.  Denies vision change, focal numbness or tingling.  Denies chest pain, dyspnea, GI or UTI symptoms.  Objective: Vitals:   05/29/20 0400 05/29/20 0600 05/29/20 1027 05/29/20 1200  BP: (!) 154/67 (!) 146/75  (!) 157/113  Pulse: (!) 59 77  80  Resp: 18 19  19   Temp: 98.2 F (36.8 C)  98.2 F (36.8 C)   TempSrc: Oral  Oral   SpO2:  98%  96%  Weight:      Height:       No intake or output data in the 24 hours ending 05/29/20 1414 Filed Weights   05/28/20 1027 05/28/20 2017 05/29/20 0236  Weight: 77.1 kg 75.3 kg 74.8 kg    Examination:  GENERAL: No apparent distress.  Nontoxic. HEENT: MMM.  Vision and hearing grossly intact.  NECK: Supple.  No apparent JVD.  RESP:  No IWOB.  Fair aeration bilaterally. CVS:  RRR. Heart sounds normal.  ABD/GI/GU: BS+. Abd soft, NTND.  MSK/EXT:  Moves extremities. No apparent deformity. No edema.  SKIN: no apparent skin lesion or wound NEURO: Awake, alert and oriented appropriately.  No apparent focal neuro deficit. PSYCH: Calm. Normal affect.  Procedures:  None  Microbiology summarized: COVID-19 PCR negative.  Assessment &  Plan: Acute left basal ganglia/thalamic infarct: presented with intermittent right-sided weakness for 1 week.  CTA head and neck without significant stenosis.  Echo as below.  LDL 145.  A1c 5.6%. -Continue Plavix and aspirin for 3 weeks.  -Continue high intensity statin -PT/OT/SLP -Follow further neurology recommendations.  Hypertensive emergency-BP 222/96 on arrival.  Improved with nitro drip. -Transitioned to low-dose losartan -Added low-dose Coreg  Hypertrophic cardiomyopathy: Echo with EF of 55 to 70%, G1 DD, moderate concentric LVH, dynamic LVOT obstruction with peak gradient of 70 that increased to 35 with Valsalva.  -Discussed with Dr. Terrence Dupont, on-call cardiologist who recommended low-dose beta-blocker and outpatient follow-up with cardiology -Low-dose Coreg started.  Anxiety and depression: Stable -Continue home medications  History of breast cancer s/p chemoradiation  Right frontal lobe cavernous angioma-incidental finding.  Hyperlipidemia: LDL 141. -High intensity statin as above.  Body mass index is 29.23 kg/m.         DVT prophylaxis:  SCD's Start: 05/28/20 2143  Code Status: Full code Family Communication: Patient and/or RN. Available if any question. Status is: Inpatient  Remains inpatient appropriate because:Hemodynamically unstable and Inpatient level of care appropriate due to severity of illness   Dispo: The patient is from: Home              Anticipated d/c is to: Home              Anticipated d/c date is: 1 day  Patient currently is not medically stable to d/c.       Consultants:  Neurology Cardiology over the phone   Sch Meds:  Scheduled Meds: . [START ON 05/30/2020] aspirin EC  81 mg Oral Daily  . atorvastatin  80 mg Oral Daily  . carvedilol  3.125 mg Oral BID WC  . [START ON 05/30/2020] clopidogrel  75 mg Oral Daily  . losartan  50 mg Oral Daily  . sertraline  75 mg Oral Daily   Continuous Infusions: PRN  Meds:.acetaminophen **OR** acetaminophen (TYLENOL) oral liquid 160 mg/5 mL **OR** acetaminophen, ondansetron (ZOFRAN) IV, polyethylene glycol  Antimicrobials: Anti-infectives (From admission, onward)   None       I have personally reviewed the following labs and images: CBC: Recent Labs  Lab 05/28/20 1201  WBC 5.9  NEUTROABS 3.0  HGB 14.4  HCT 45.3  MCV 90.1  PLT 322   BMP &GFR Recent Labs  Lab 05/28/20 1201  NA 139  K 4.0  CL 105  CO2 26  GLUCOSE 102*  BUN 15  CREATININE 0.86  CALCIUM 10.2   Estimated Creatinine Clearance: 52.3 mL/min (by C-G formula based on SCr of 0.86 mg/dL). Liver & Pancreas: No results for input(s): AST, ALT, ALKPHOS, BILITOT, PROT, ALBUMIN in the last 168 hours. No results for input(s): LIPASE, AMYLASE in the last 168 hours. No results for input(s): AMMONIA in the last 168 hours. Diabetic: Recent Labs    05/29/20 0543  HGBA1C 5.6   No results for input(s): GLUCAP in the last 168 hours. Cardiac Enzymes: No results for input(s): CKTOTAL, CKMB, CKMBINDEX, TROPONINI in the last 168 hours. No results for input(s): PROBNP in the last 8760 hours. Coagulation Profile: No results for input(s): INR, PROTIME in the last 168 hours. Thyroid Function Tests: No results for input(s): TSH, T4TOTAL, FREET4, T3FREE, THYROIDAB in the last 72 hours. Lipid Profile: Recent Labs    05/29/20 0543  CHOL 211*  HDL 44  LDLCALC 145*  TRIG 111  CHOLHDL 4.8   Anemia Panel: No results for input(s): VITAMINB12, FOLATE, FERRITIN, TIBC, IRON, RETICCTPCT in the last 72 hours. Urine analysis:    Component Value Date/Time   COLORURINE DARK YELLOW 06/13/2018 0910   APPEARANCEUR CLOUDY (A) 06/13/2018 0910   APPEARANCEUR Cloudy (A) 02/12/2015 0907   LABSPEC 1.025 06/13/2018 0910   PHURINE 5.5 06/13/2018 0910   GLUCOSEU NEGATIVE 06/13/2018 0910   HGBUR NEGATIVE 06/13/2018 0910   BILIRUBINUR Negative 02/12/2015 0907   KETONESUR NEGATIVE 06/13/2018 0910    PROTEINUR TRACE (A) 06/13/2018 0910   NITRITE NEGATIVE 06/13/2018 0910   LEUKOCYTESUR 3+ (A) 06/13/2018 0910   LEUKOCYTESUR 3+ (A) 02/12/2015 0907   Sepsis Labs: Invalid input(s): PROCALCITONIN, [place]  Microbiology: Recent Results (from the past 240 hour(s))  SARS Coronavirus 2 by RT PCR (hospital order, performed in [facility] hospital lab) Nasopharyngeal Nasopharyngeal Swab     Status: None   Collection Time: 05/28/20  5:02 PM   Specimen: Nasopharyngeal Swab  Result Value Ref Range Status   SARS Coronavirus 2 NEGATIVE NEGATIVE Final    Comment: (NOTE) SARS-CoV-2 target nucleic acids are NOT DETECTED.  The SARS-CoV-2 RNA is generally detectable in upper and lower respiratory specimens during the acute phase of infection. The lowest concentration of SARS-CoV-2 viral copies this assay can detect is 250 copies / mL. A negative result does not preclude SARS-CoV-2 infection and should not be used as the sole basis for treatment or other patient management decisions.  A negative result  may occur with improper specimen collection / handling, submission of specimen other than nasopharyngeal swab, presence of viral mutation(s) within the areas targeted by this assay, and inadequate number of viral copies (<250 copies / mL). A negative result must be combined with clinical observations, patient history, and epidemiological information.  Fact Sheet for Patients:   StrictlyIdeas.no  Fact Sheet for Healthcare Providers: BankingDealers.co.za  This test is not yet approved or  cleared by the [place] FDA and has been authorized for detection and/or diagnosis of SARS-CoV-2 by FDA under an Emergency Use Authorization (EUA).  This EUA will remain in effect (meaning this test can be used) for the duration of the COVID-19 declaration under Section 564(b)(1) of the Act, 21 U.S.C. section 360bbb-3(b)(1), unless the authorization is  terminated or revoked sooner.  Performed at [facility], [address] [place], [place] [address]   MRSA PCR Screening     Status: None   Collection Time: 05/28/20  8:17 PM   Specimen: Nasopharyngeal  Result Value Ref Range Status   MRSA by PCR NEGATIVE NEGATIVE Final    Comment:        The GeneXpert MRSA Assay (FDA approved for NASAL specimens only), is one component of a comprehensive MRSA colonization surveillance program. It is not intended to diagnose MRSA infection nor to guide or monitor treatment for MRSA infections. Performed at [facility], [place] [address], [place], [place] [address]     Radiology Studies: CT ANGIO HEAD W OR WO CONTRAST  Result Date: 05/29/2020 CLINICAL DATA:  Right lower extremity weakness. EXAM: CT ANGIOGRAPHY HEAD AND NECK TECHNIQUE: Multidetector CT imaging of the head and neck was performed using the standard protocol during bolus administration of intravenous contrast. Multiplanar CT image reconstructions and MIPs were obtained to evaluate the vascular anatomy. Carotid stenosis measurements (when applicable) are obtained utilizing NASCET criteria, using the distal internal carotid diameter as the denominator. CONTRAST:  3mL OMNIPAQUE IOHEXOL 350 MG/ML SOLN COMPARISON:  None. Brain MRI 05/29/2020 FINDINGS: CTA NECK FINDINGS SKELETON: There is no bony spinal canal stenosis. No lytic or blastic lesion. OTHER NECK: Normal pharynx, larynx and major salivary glands. No cervical lymphadenopathy. Unremarkable thyroid gland. UPPER CHEST: No pneumothorax or pleural effusion. No nodules or masses. AORTIC ARCH: There is mild calcific atherosclerosis of the aortic arch. There is no aneurysm, dissection or hemodynamically significant stenosis of the visualized portion of the aorta. Conventional 3 vessel aortic branching pattern. The visualized proximal subclavian arteries are widely patent. RIGHT CAROTID SYSTEM: Normal without aneurysm, dissection  or stenosis. LEFT CAROTID SYSTEM: Normal without aneurysm, dissection or stenosis. VERTEBRAL ARTERIES: Left dominant configuration. Both origins are clearly patent. There is no dissection, occlusion or flow-limiting stenosis to the skull base (V1-V3 segments). CTA HEAD FINDINGS POSTERIOR CIRCULATION: --Vertebral arteries: Normal V4 segments. --Inferior cerebellar arteries: Normal. --Basilar artery: Normal. --Superior cerebellar arteries: Normal. --Posterior cerebral arteries (PCA): Normal. ANTERIOR CIRCULATION: --Intracranial internal carotid arteries: Normal. --Anterior cerebral arteries (ACA): Normal. Both A1 segments are present. Patent anterior communicating artery (a-comm). --Middle cerebral arteries (MCA): Normal. VENOUS SINUSES: As permitted by contrast timing, patent. ANATOMIC VARIANTS: None Review of the MIP images confirms the above findings. IMPRESSION: 1. No emergent large vessel occlusion or high-grade stenosis of the intracranial or cervical arteries. 2. Aortic Atherosclerosis (ICD10-I70.0). Electronically Signed   By: Ulyses Jarred M.D.   On: 05/29/2020 02:10   CT ANGIO NECK W OR WO CONTRAST  Result Date: 05/29/2020 CLINICAL DATA:  Right lower extremity  weakness. EXAM: CT ANGIOGRAPHY HEAD AND NECK TECHNIQUE: Multidetector CT imaging of the head and neck was performed using the standard protocol during bolus administration of intravenous contrast. Multiplanar CT image reconstructions and MIPs were obtained to evaluate the vascular anatomy. Carotid stenosis measurements (when applicable) are obtained utilizing NASCET criteria, using the distal internal carotid diameter as the denominator. CONTRAST:  55mL OMNIPAQUE IOHEXOL 350 MG/ML SOLN COMPARISON:  None. Brain MRI 05/29/2020 FINDINGS: CTA NECK FINDINGS SKELETON: There is no bony spinal canal stenosis. No lytic or blastic lesion. OTHER NECK: Normal pharynx, larynx and major salivary glands. No cervical lymphadenopathy. Unremarkable thyroid gland.  UPPER CHEST: No pneumothorax or pleural effusion. No nodules or masses. AORTIC ARCH: There is mild calcific atherosclerosis of the aortic arch. There is no aneurysm, dissection or hemodynamically significant stenosis of the visualized portion of the aorta. Conventional 3 vessel aortic branching pattern. The visualized proximal subclavian arteries are widely patent. RIGHT CAROTID SYSTEM: Normal without aneurysm, dissection or stenosis. LEFT CAROTID SYSTEM: Normal without aneurysm, dissection or stenosis. VERTEBRAL ARTERIES: Left dominant configuration. Both origins are clearly patent. There is no dissection, occlusion or flow-limiting stenosis to the skull base (V1-V3 segments). CTA HEAD FINDINGS POSTERIOR CIRCULATION: --Vertebral arteries: Normal V4 segments. --Inferior cerebellar arteries: Normal. --Basilar artery: Normal. --Superior cerebellar arteries: Normal. --Posterior cerebral arteries (PCA): Normal. ANTERIOR CIRCULATION: --Intracranial internal carotid arteries: Normal. --Anterior cerebral arteries (ACA): Normal. Both A1 segments are present. Patent anterior communicating artery (a-comm). --Middle cerebral arteries (MCA): Normal. VENOUS SINUSES: As permitted by contrast timing, patent. ANATOMIC VARIANTS: None Review of the MIP images confirms the above findings. IMPRESSION: 1. No emergent large vessel occlusion or high-grade stenosis of the intracranial or cervical arteries. 2. Aortic Atherosclerosis (ICD10-I70.0). Electronically Signed   By: Ulyses Jarred M.D.   On: 05/29/2020 02:10   MR BRAIN WO CONTRAST  Result Date: 05/29/2020 CLINICAL DATA:  Right lower extremity weakness EXAM: MRI HEAD WITHOUT CONTRAST TECHNIQUE: Multiplanar, multiecho pulse sequences of the brain and surrounding structures were obtained without intravenous contrast. COMPARISON:  Brain MRI 03/23/2005 Head CT 05/28/2020 FINDINGS: Brain: There is a small acute infarct at the posterior aspect of the left basal ganglia putamen. There  is magnetic susceptibility effect in the right frontal lobe at the site of known cavernous angioma. Multifocal white matter hyperintensity, most commonly due to chronic ischemic microangiopathy. There is generalized atrophy without lobar predilection. No chronic microhemorrhage. Normal midline structures. Vascular: Normal flow voids. Skull and upper cervical spine: Normal marrow signal. Sinuses/Orbits: Moderate paranasal sinus disease. Bilateral ocular lens replacements. Other: None IMPRESSION: 1. Small acute infarct at the posterior aspect of the left basal ganglia putamen. No acute hemorrhage or mass effect. 2. Right frontal lobe cavernous angioma. Electronically Signed   By: Ulyses Jarred M.D.   On: 05/29/2020 01:25   ECHOCARDIOGRAM COMPLETE BUBBLE STUDY  Result Date: 05/29/2020    ECHOCARDIOGRAM REPORT   Patient Name:   Erin Cunningham Date of Exam: 05/29/2020 Medical Rec #:  [id number]      Height:       63.0 in Accession #:    [id number]     Weight:       165.0 lb Date of Birth:  1941/05/07      BSA:          1.782 m Patient Age:    73 years       BP:           146/75 mmHg Patient Gender: F  HR:           81 bpm. Exam Location:  Inpatient Procedure: 2D Echo, Cardiac Doppler, Color Doppler and Saline Contrast Bubble            Study Indications:    Stroke  History:        Patient has no prior history of Echocardiogram examinations.                 Risk Factors:Hypertension and Dyslipidemia.  Sonographer:    Roseanna Rainbow RDCS Referring Phys: [id number] [place]  1. There is a dynamic LVOT obstruction. Peak gradient of 17 mmHg. The gradient increases to 35 mmHg with Valsalva.     . Left ventricular ejection fraction, by estimation, is 65 to 70%. The left ventricle has normal function. The left ventricle has no regional wall motion abnormalities. There is moderate concentric left ventricular hypertrophy. Left ventricular diastolic parameters are consistent with Grade I diastolic  dysfunction (impaired relaxation).  2. Right ventricular systolic function is normal. The right ventricular size is normal. There is normal pulmonary artery systolic pressure.  3. The mitral valve is grossly normal. No evidence of mitral valve regurgitation. No evidence of mitral stenosis.  4. Tricuspid valve regurgitation is mild to moderate.  5. The aortic valve is tricuspid. Aortic valve regurgitation is not visualized. No aortic stenosis is present. FINDINGS  Left Ventricle: There is a dynamic LVOT obstruction. Peak gradient of 17 mmHg. The gradient increases to 35 mmHg with Valsalva. Left ventricular ejection fraction, by estimation, is 65 to 70%. The left ventricle has normal function. The left ventricle has no regional wall motion abnormalities. The left ventricular internal cavity size was small. There is moderate concentric left ventricular hypertrophy. Left ventricular diastolic parameters are consistent with Grade I diastolic dysfunction (impaired relaxation). Right Ventricle: The right ventricular size is normal. No increase in right ventricular wall thickness. Right ventricular systolic function is normal. There is normal pulmonary artery systolic pressure. The tricuspid regurgitant velocity is 2.57 m/s, and  with an assumed right atrial pressure of 8 mmHg, the estimated right ventricular systolic pressure is 40.3 mmHg. Left Atrium: Left atrial size was normal in size. Right Atrium: Right atrial size was normal in size. Pericardium: There is no evidence of pericardial effusion. Mitral Valve: The mitral valve is grossly normal. No evidence of mitral valve regurgitation. No evidence of mitral valve stenosis. Tricuspid Valve: The tricuspid valve is normal in structure. Tricuspid valve regurgitation is mild to moderate. No evidence of tricuspid stenosis. Aortic Valve: The aortic valve is tricuspid. . There is mild thickening and mild calcification of the aortic valve. Aortic valve regurgitation is not  visualized. No aortic stenosis is present. Mild aortic valve annular calcification. There is mild thickening of the aortic valve. There is mild calcification of the aortic valve. Pulmonic Valve: The pulmonic valve was grossly normal. Pulmonic valve regurgitation is not visualized. No evidence of pulmonic stenosis. Aorta: The aortic root and ascending aorta are structurally normal, with no evidence of dilitation. IAS/Shunts: No atrial level shunt detected by color flow Doppler. Agitated saline contrast was given intravenously to evaluate for intracardiac shunting.  LEFT VENTRICLE PLAX 2D LVIDd:         2.60 cm     Diastology LVIDs:         1.80 cm     LV e' lateral:   5.87 cm/s LV PW:         1.40 cm  LV E/e' lateral: 11.6 LV IVS:        1.30 cm     LV e' medial:    4.79 cm/s LVOT diam:     1.70 cm     LV E/e' medial:  14.2 LV SV:         47 LV SV Index:   26 LVOT Area:     2.27 cm  LV Volumes (MOD) LV vol d, MOD A2C: 48.5 ml LV vol d, MOD A4C: 54.2 ml LV vol s, MOD A2C: 11.4 ml LV vol s, MOD A4C: 11.4 ml LV SV MOD A2C:     37.1 ml LV SV MOD A4C:     54.2 ml LV SV MOD BP:      39.2 ml RIGHT VENTRICLE             IVC RV S prime:     11.84 cm/s  IVC diam: 0.90 cm TAPSE (M-mode): 2.5 cm LEFT ATRIUM             Index       RIGHT ATRIUM           Index LA diam:        2.20 cm 1.23 cm/m  RA Area:     11.10 cm LA Vol (A2C):   30.4 ml 17.06 ml/m RA Volume:   21.70 ml  12.18 ml/m LA Vol (A4C):   9.9 ml  5.56 ml/m LA Biplane Vol: 18.1 ml 10.16 ml/m  AORTIC VALVE LVOT Vmax:   101.00 cm/s LVOT Vmean:  63.700 cm/s LVOT VTI:    0.206 m  AORTA Ao Root diam: 3.00 cm Ao Asc diam:  3.00 cm MITRAL VALVE                TRICUSPID VALVE MV Area (PHT): 2.13 cm     TR Peak grad:   26.4 mmHg MV Decel Time: 356 msec     TR Vmax:        257.00 cm/s MV E velocity: 67.90 cm/s MV A velocity: 109.00 cm/s  SHUNTS MV E/A ratio:  0.62         Systemic VTI:  0.21 m                             Systemic Diam: 1.70 cm Mertie Moores MD  Electronically signed by Mertie Moores MD Signature Date/Time: 05/29/2020/9:09:20 AM    Final     Onis Markoff T. Prentiss  If 7PM-7AM, please contact night-coverage [email or web address] Password [facility] 05/29/2020, 2:14 PM

## 2020-05-29 NOTE — Progress Notes (Signed)
  Echocardiogram 2D Echocardiogram has been performed.  Erin Cunningham 05/29/2020, 8:54 AM

## 2020-05-29 NOTE — Consult Note (Signed)
Neurology Consultation Reason for Consult: Stroke Referring Physician: Cyd Silence, G  CC: Right leg weakness  History is obtained from: Patient  HPI: Erin Cunningham is a 79 y.o. female who reports a weeklong history of right leg weakness.  She states sometimes it affects her walking more than other times.  She states that when it is most noticeable is when she is trying to walk and her leg is giving out.  She has not noticed any symptoms in her arm.  She denies any slurred speech.  Due to almost fallen, she sought care in the [facility] emergency department where she was found to be severely hypertensive.  She was started on a nitroglycerin drip and transferred to [facility].  Due to the weakness, an MRI of her brain was obtained which does demonstrate an acute ischemic infarct.   LKW: 1 week ago tpa given?: no, outside of window   ROS: A 14 point ROS was performed and is negative except as noted in the HPI.     Past Medical History:  Diagnosis Date  . Breast CA ([place]) 2006   right [facility])  . Cancer ([place])   . Helicobacter pylori gastritis 2001  . High blood pressure   . History of adenomatous polyp of colon 09/23/2017   [place] 09/25/2017 and apparently polyps in the past as well though recalled age  . History of shingles   . Major depressive disorder 05/28/2020  . Major depressive disorder, recurrent, severe without psychotic features ([place]) 06/24/2015  . Parotid mass 2014   right benign cyst  . Personal history of radiation therapy 2006   Rt breast  . Wears glasses      Family History  Problem Relation Age of Onset  . Heart disease Mother        MI  . Cancer Father        bone  . Thyroid disease Neg Hx   . Hypercalcemia Neg Hx   . Colon cancer Neg Hx   . Stomach cancer Neg Hx   . Rectal cancer Neg Hx   . Esophageal cancer Neg Hx      Social History:  reports that she has never smoked. She quit smokeless tobacco use about 2 years ago.  Her smokeless tobacco  use included chew. She reports current alcohol use. She reports that she does not use drugs.   Exam: Current vital signs: BP (!) 154/67 (BP Location: Left Arm)   Pulse (!) 59   Temp 98.4 F (36.9 C) (Oral)   Resp 18   Ht 5\' 3"  (1.6 m)   Wt 74.8 kg   SpO2 96%   BMI 29.23 kg/m  Vital signs in last 24 hours: Temp:  [98.1 F (36.7 C)-98.6 F (37 C)] 98.4 F (36.9 C) (06/23 0124) Pulse Rate:  [58-86] 59 (06/23 0400) Resp:  [12-20] 18 (06/23 0400) BP: (135-226)/(67-97) 154/67 (06/23 0400) SpO2:  [96 %-100 %] 96 % (06/23 0124) Weight:  [74.8 kg-77.1 kg] 74.8 kg (06/23 0236)   Physical Exam  Constitutional: Appears well-developed and well-nourished.  Psych: Affect appropriate to situation Eyes: No scleral injection HENT: No OP obstrucion MSK: no joint deformities.  Cardiovascular: Normal rate and regular rhythm.  Respiratory: Effort normal, non-labored breathing GI: Soft.  No distension. There is no tenderness.  Skin: WDI  Neuro: Mental Status: Patient is awake, alert, oriented to person, place, month, year, and situation. Patient is able to give a clear and coherent history. No signs of  aphasia or neglect Cranial Nerves: II: Visual Fields are full. Pupils are equal, round, and reactive to light.   III,IV, VI: EOMI without ptosis or diploplia.  V: Facial sensation is symmetric to temperature VII: Facial movement is symmetric.  VIII: hearing is intact to voice X: Uvula elevates symmetrically XI: Shoulder shrug is symmetric. XII: tongue is midline without atrophy or fasciculations.  Motor: Tone is normal. Bulk is normal. 5/5 strength was present on the left, on the right she has 4/5 weakness of the right leg and mild drift in the right arm, though she has very good strength to confrontation. Sensory: Sensation is symmetric to light touch and temperature in the arms and legs. Cerebellar: FNF  intact bilaterally      I have reviewed labs in epic and the results  pertinent to this consultation are: BMP-unremarkable  I have reviewed the images obtained: MRI brain-lacunar appearing on the left  Impression: 79 year old female with likely lacunar infarct in setting of hypertension.  She has had symptoms for the past week, therefore I think that the 180 goal currently in use is reasonable.  Recommendations: - HgbA1c, fasting lipid panel - Frequent neuro checks - Echocardiogram - Prophylactic therapy-Antiplatelet med: plavix 75mg  daily with ASA 81mg  daily x 3 weeks - Risk factor modification - Telemetry monitoring - PT consult, OT consult, Speech consult - Stroke team to follow    Roland Rack, MD [facility] [phone]  If 7pm- 7am, please page neurology on call as listed in [place].

## 2020-05-29 NOTE — Progress Notes (Signed)
STROKE TEAM PROGRESS NOTE   INTERVAL HISTORY I have personally reviewed history of presenting illness with the patient, electronic medical records and imaging films in PACS.  She presented with 1 week history of right leg dragging and weakness.  MRI shows a left basal ganglia lacunar infarct.  There is incidental right frontal venous angioma.  CT angiogram of the brain and neck showed no significant large vessel stenosis or occlusion.  Hemoglobin A1c is 5.6.  Echocardiogram and lipid profile are pending  Vitals:   05/29/20 0236 05/29/20 0300 05/29/20 0400 05/29/20 0600  BP:  (!) 188/75 (!) 154/67 (!) 146/75  Pulse:  (!) 58 (!) 59 77  Resp:  12 18 19   Temp:   98.2 F (36.8 C)   TempSrc:   Oral   SpO2:    98%  Weight: 74.8 kg     Height:        CBC:  Recent Labs  Lab 05/28/20 1201  WBC 5.9  NEUTROABS 3.0  HGB 14.4  HCT 45.3  MCV 90.1  PLT 818    Basic Metabolic Panel:  Recent Labs  Lab 05/28/20 1201  NA 139  K 4.0  CL 105  CO2 26  GLUCOSE 102*  BUN 15  CREATININE 0.86  CALCIUM 10.2   Lipid Panel:     Component Value Date/Time   CHOL 211 (H) 05/29/2020 0543   CHOL 204 (H) 02/12/2015 0905   TRIG 111 05/29/2020 0543   HDL 44 05/29/2020 0543   HDL 51 02/12/2015 0905   CHOLHDL 4.8 05/29/2020 0543   VLDL 22 05/29/2020 0543   LDLCALC 145 (H) 05/29/2020 0543   LDLCALC 138 (H) 01/15/2020 1034   HgbA1c:  Lab Results  Component Value Date   HGBA1C 5.6 05/29/2020   Urine Drug Screen: No results found for: LABOPIA, COCAINSCRNUR, LABBENZ, AMPHETMU, THCU, LABBARB  Alcohol Level     Component Value Date/Time   ETH <5 06/24/2015 0728    IMAGING past 24 hours CT ANGIO HEAD W OR WO CONTRAST  Result Date: 05/29/2020 CLINICAL DATA:  Right lower extremity weakness. EXAM: CT ANGIOGRAPHY HEAD AND NECK TECHNIQUE: Multidetector CT imaging of the head and neck was performed using the standard protocol during bolus administration of intravenous contrast. Multiplanar CT image  reconstructions and MIPs were obtained to evaluate the vascular anatomy. Carotid stenosis measurements (when applicable) are obtained utilizing NASCET criteria, using the distal internal carotid diameter as the denominator. CONTRAST:  73mL OMNIPAQUE IOHEXOL 350 MG/ML SOLN COMPARISON:  None. Brain MRI 05/29/2020 FINDINGS: CTA NECK FINDINGS SKELETON: There is no bony spinal canal stenosis. No lytic or blastic lesion. OTHER NECK: Normal pharynx, larynx and major salivary glands. No cervical lymphadenopathy. Unremarkable thyroid gland. UPPER CHEST: No pneumothorax or pleural effusion. No nodules or masses. AORTIC ARCH: There is mild calcific atherosclerosis of the aortic arch. There is no aneurysm, dissection or hemodynamically significant stenosis of the visualized portion of the aorta. Conventional 3 vessel aortic branching pattern. The visualized proximal subclavian arteries are widely patent. RIGHT CAROTID SYSTEM: Normal without aneurysm, dissection or stenosis. LEFT CAROTID SYSTEM: Normal without aneurysm, dissection or stenosis. VERTEBRAL ARTERIES: Left dominant configuration. Both origins are clearly patent. There is no dissection, occlusion or flow-limiting stenosis to the skull base (V1-V3 segments). CTA HEAD FINDINGS POSTERIOR CIRCULATION: --Vertebral arteries: Normal V4 segments. --Inferior cerebellar arteries: Normal. --Basilar artery: Normal. --Superior cerebellar arteries: Normal. --Posterior cerebral arteries (PCA): Normal. ANTERIOR CIRCULATION: --Intracranial internal carotid arteries: Normal. --Anterior cerebral arteries (ACA): Normal. Both A1  segments are present. Patent anterior communicating artery (a-comm). --Middle cerebral arteries (MCA): Normal. VENOUS SINUSES: As permitted by contrast timing, patent. ANATOMIC VARIANTS: None Review of the MIP images confirms the above findings. IMPRESSION: 1. No emergent large vessel occlusion or high-grade stenosis of the intracranial or cervical arteries. 2.  Aortic Atherosclerosis (ICD10-I70.0). Electronically Signed   By: Ulyses Jarred M.D.   On: 05/29/2020 02:10   CT Head Wo Contrast  Result Date: 05/28/2020 CLINICAL DATA:  Headache, acute, normal neuro exam. Additional provided: Dizziness, off balance for 1 week, elevated blood pressure. EXAM: CT HEAD WITHOUT CONTRAST TECHNIQUE: Contiguous axial images were obtained from the base of the skull through the vertex without intravenous contrast. COMPARISON:  Head CT 02/28/2015, brain MRI 03/23/2005, head CT 02/27/2005 FINDINGS: Brain: As before, there is slight prominence of the lateral ventricles. However, the temporal horns remain normal in size and there is no convincing evidence of hydrocephalus. Mild patchy hypoattenuation within the cerebral white matter is nonspecific, but consistent with chronic small vessel ischemic disease. Findings have progressed as compared to prior head CT 02/28/2015. Redemonstrated faint hyperdensity within the right frontal lobe at site of a known cavernoma, previously demonstrated on MRI 03/23/2005. There is no acute intracranial hemorrhage. No demarcated cortical infarct. No extra-axial fluid collection. No evidence of intracranial mass. No midline shift. Vascular: No convincing hyperdense vessel. Skull: Normal. Negative for fracture or focal lesion. Sinuses/Orbits: The orbits are largely excluded from the field of view. No evidence of acute orbital abnormality. Partially imaged mucosal thickening within the ethmoid and maxillary sinuses. Partially imaged frothy secretions within the right maxillary sinus. No significant mastoid effusion. IMPRESSION: No evidence of acute intracranial abnormality. Mild chronic small vessel ischemic changes within the cerebral white matter, progressed as compared to head CT 02/28/2015. Redemonstrated hyperdensity in the region of a known right frontal lobe cavernoma. Paranasal sinus disease as described. Correlate for acute sinusitis. Electronically  Signed   By: Kellie Simmering DO   On: 05/28/2020 14:12   CT ANGIO NECK W OR WO CONTRAST  Result Date: 05/29/2020 CLINICAL DATA:  Right lower extremity weakness. EXAM: CT ANGIOGRAPHY HEAD AND NECK TECHNIQUE: Multidetector CT imaging of the head and neck was performed using the standard protocol during bolus administration of intravenous contrast. Multiplanar CT image reconstructions and MIPs were obtained to evaluate the vascular anatomy. Carotid stenosis measurements (when applicable) are obtained utilizing NASCET criteria, using the distal internal carotid diameter as the denominator. CONTRAST:  78mL OMNIPAQUE IOHEXOL 350 MG/ML SOLN COMPARISON:  None. Brain MRI 05/29/2020 FINDINGS: CTA NECK FINDINGS SKELETON: There is no bony spinal canal stenosis. No lytic or blastic lesion. OTHER NECK: Normal pharynx, larynx and major salivary glands. No cervical lymphadenopathy. Unremarkable thyroid gland. UPPER CHEST: No pneumothorax or pleural effusion. No nodules or masses. AORTIC ARCH: There is mild calcific atherosclerosis of the aortic arch. There is no aneurysm, dissection or hemodynamically significant stenosis of the visualized portion of the aorta. Conventional 3 vessel aortic branching pattern. The visualized proximal subclavian arteries are widely patent. RIGHT CAROTID SYSTEM: Normal without aneurysm, dissection or stenosis. LEFT CAROTID SYSTEM: Normal without aneurysm, dissection or stenosis. VERTEBRAL ARTERIES: Left dominant configuration. Both origins are clearly patent. There is no dissection, occlusion or flow-limiting stenosis to the skull base (V1-V3 segments). CTA HEAD FINDINGS POSTERIOR CIRCULATION: --Vertebral arteries: Normal V4 segments. --Inferior cerebellar arteries: Normal. --Basilar artery: Normal. --Superior cerebellar arteries: Normal. --Posterior cerebral arteries (PCA): Normal. ANTERIOR CIRCULATION: --Intracranial internal carotid arteries: Normal. --Anterior cerebral arteries (ACA): Normal. Both  A1 segments are present. Patent anterior communicating artery (a-comm). --Middle cerebral arteries (MCA): Normal. VENOUS SINUSES: As permitted by contrast timing, patent. ANATOMIC VARIANTS: None Review of the MIP images confirms the above findings. IMPRESSION: 1. No emergent large vessel occlusion or high-grade stenosis of the intracranial or cervical arteries. 2. Aortic Atherosclerosis (ICD10-I70.0). Electronically Signed   By: Ulyses Jarred M.D.   On: 05/29/2020 02:10   MR BRAIN WO CONTRAST  Result Date: 05/29/2020 CLINICAL DATA:  Right lower extremity weakness EXAM: MRI HEAD WITHOUT CONTRAST TECHNIQUE: Multiplanar, multiecho pulse sequences of the brain and surrounding structures were obtained without intravenous contrast. COMPARISON:  Brain MRI 03/23/2005 Head CT 05/28/2020 FINDINGS: Brain: There is a small acute infarct at the posterior aspect of the left basal ganglia putamen. There is magnetic susceptibility effect in the right frontal lobe at the site of known cavernous angioma. Multifocal white matter hyperintensity, most commonly due to chronic ischemic microangiopathy. There is generalized atrophy without lobar predilection. No chronic microhemorrhage. Normal midline structures. Vascular: Normal flow voids. Skull and upper cervical spine: Normal marrow signal. Sinuses/Orbits: Moderate paranasal sinus disease. Bilateral ocular lens replacements. Other: None IMPRESSION: 1. Small acute infarct at the posterior aspect of the left basal ganglia putamen. No acute hemorrhage or mass effect. 2. Right frontal lobe cavernous angioma. Electronically Signed   By: Ulyses Jarred M.D.   On: 05/29/2020 01:25   ECHOCARDIOGRAM COMPLETE BUBBLE STUDY  Result Date: 05/29/2020    ECHOCARDIOGRAM REPORT   Patient Name:   Erin Cunningham Date of Exam: 05/29/2020 Medical Rec #:  [id number]      Height:       63.0 in Accession #:    [id number]     Weight:       165.0 lb Date of Birth:  October 14, 1941      BSA:          1.782 m  Patient Age:    58 years       BP:           146/75 mmHg Patient Gender: F              HR:           81 bpm. Exam Location:  Inpatient Procedure: 2D Echo, Cardiac Doppler, Color Doppler and Saline Contrast Bubble            Study Indications:    Stroke  History:        Patient has no prior history of Echocardiogram examinations.                 Risk Factors:Hypertension and Dyslipidemia.  Sonographer:    Roseanna Rainbow RDCS Referring Phys: [id number] [place]  1. There is a dynamic LVOT obstruction. Peak gradient of 17 mmHg. The gradient increases to 35 mmHg with Valsalva.     . Left ventricular ejection fraction, by estimation, is 65 to 70%. The left ventricle has normal function. The left ventricle has no regional wall motion abnormalities. There is moderate concentric left ventricular hypertrophy. Left ventricular diastolic parameters are consistent with Grade I diastolic dysfunction (impaired relaxation).  2. Right ventricular systolic function is normal. The right ventricular size is normal. There is normal pulmonary artery systolic pressure.  3. The mitral valve is grossly normal. No evidence of mitral valve regurgitation. No evidence of mitral stenosis.  4. Tricuspid valve regurgitation is mild to moderate.  5. The aortic valve is tricuspid. Aortic valve regurgitation is not visualized. No aortic  stenosis is present. FINDINGS  Left Ventricle: There is a dynamic LVOT obstruction. Peak gradient of 17 mmHg. The gradient increases to 35 mmHg with Valsalva. Left ventricular ejection fraction, by estimation, is 65 to 70%. The left ventricle has normal function. The left ventricle has no regional wall motion abnormalities. The left ventricular internal cavity size was small. There is moderate concentric left ventricular hypertrophy. Left ventricular diastolic parameters are consistent with Grade I diastolic dysfunction (impaired relaxation). Right Ventricle: The right ventricular size is normal. No  increase in right ventricular wall thickness. Right ventricular systolic function is normal. There is normal pulmonary artery systolic pressure. The tricuspid regurgitant velocity is 2.57 m/s, and  with an assumed right atrial pressure of 8 mmHg, the estimated right ventricular systolic pressure is 92.4 mmHg. Left Atrium: Left atrial size was normal in size. Right Atrium: Right atrial size was normal in size. Pericardium: There is no evidence of pericardial effusion. Mitral Valve: The mitral valve is grossly normal. No evidence of mitral valve regurgitation. No evidence of mitral valve stenosis. Tricuspid Valve: The tricuspid valve is normal in structure. Tricuspid valve regurgitation is mild to moderate. No evidence of tricuspid stenosis. Aortic Valve: The aortic valve is tricuspid. . There is mild thickening and mild calcification of the aortic valve. Aortic valve regurgitation is not visualized. No aortic stenosis is present. Mild aortic valve annular calcification. There is mild thickening of the aortic valve. There is mild calcification of the aortic valve. Pulmonic Valve: The pulmonic valve was grossly normal. Pulmonic valve regurgitation is not visualized. No evidence of pulmonic stenosis. Aorta: The aortic root and ascending aorta are structurally normal, with no evidence of dilitation. IAS/Shunts: No atrial level shunt detected by color flow Doppler. Agitated saline contrast was given intravenously to evaluate for intracardiac shunting.  LEFT VENTRICLE PLAX 2D LVIDd:         2.60 cm     Diastology LVIDs:         1.80 cm     LV e' lateral:   5.87 cm/s LV PW:         1.40 cm     LV E/e' lateral: 11.6 LV IVS:        1.30 cm     LV e' medial:    4.79 cm/s LVOT diam:     1.70 cm     LV E/e' medial:  14.2 LV SV:         47 LV SV Index:   26 LVOT Area:     2.27 cm  LV Volumes (MOD) LV vol d, MOD A2C: 48.5 ml LV vol d, MOD A4C: 54.2 ml LV vol s, MOD A2C: 11.4 ml LV vol s, MOD A4C: 11.4 ml LV SV MOD A2C:     37.1  ml LV SV MOD A4C:     54.2 ml LV SV MOD BP:      39.2 ml RIGHT VENTRICLE             IVC RV S prime:     11.84 cm/s  IVC diam: 0.90 cm TAPSE (M-mode): 2.5 cm LEFT ATRIUM             Index       RIGHT ATRIUM           Index LA diam:        2.20 cm 1.23 cm/m  RA Area:     11.10 cm LA Vol (A2C):   30.4 ml 17.06 ml/m RA Volume:  21.70 ml  12.18 ml/m LA Vol (A4C):   9.9 ml  5.56 ml/m LA Biplane Vol: 18.1 ml 10.16 ml/m  AORTIC VALVE LVOT Vmax:   101.00 cm/s LVOT Vmean:  63.700 cm/s LVOT VTI:    0.206 m  AORTA Ao Root diam: 3.00 cm Ao Asc diam:  3.00 cm MITRAL VALVE                TRICUSPID VALVE MV Area (PHT): 2.13 cm     TR Peak grad:   26.4 mmHg MV Decel Time: 356 msec     TR Vmax:        257.00 cm/s MV E velocity: 67.90 cm/s MV A velocity: 109.00 cm/s  SHUNTS MV E/A ratio:  0.62         Systemic VTI:  0.21 m                             Systemic Diam: 1.70 cm Mertie Moores MD Electronically signed by Mertie Moores MD Signature Date/Time: 05/29/2020/9:09:20 AM    Final     PHYSICAL EXAM Pleasant elderly African-American lady not in distress. . Afebrile. Head is nontraumatic. Neck is supple without bruit.    Cardiac exam no murmur or gallop. Lungs are clear to auscultation. Distal pulses are well felt. Neurological Exam ;  Awake  Alert oriented x 3. Normal speech and language.eye movements full without nystagmus.fundi were not visualized. Vision acuity and fields appear normal. Hearing is normal. Palatal movements are normal. Face symmetric. Tongue midline. Normal strength, tone, reflexes and coordination.  Except mild 4/5 weakness of right hip flexors and ankle dorsiflexors only.  Normal sensation. Gait deferred.  ASSESSMENT/PLAN Erin Cunningham is a 79 y.o. female with history of HTN, breat cancer, depression presenting with 1 wk hx R leg weakness.   Stroke:   L basal ganglia infarct secondary to small vessel disease    CT head No acute abnormality. Small vessel disease. Known R frontal cavernoma  (hyperdensity seen). Sinus dz.   MRI  L basal ganglia infarct. R frontal lobe cavernoma  CTA head & neck no ELVO or high-grade stenosis. Aortic atherosclerosis.   2D Echo bubble EF 65-70%. No source of embolus   LDL 145  HgbA1c 5.6  SCDs for VTE prophylaxis  No antithrombotic prior to admission, now on aspirin 325 mg daily and clopidogrel 75 mg daily following plavix load. Will decrease aspirin to 81 and continue DAPT x 3 weeks then aspirin alone.   Therapy recommendations:  No therapy needs  Disposition:  Return home  Hypertension  Elevated on arrival 226/95  Now Stable . Permissive hypertension (OK if < 220/120) but gradually normalize in 5-7 days . Long-term BP goal normotensive  Hyperlipidemia  Home meds:  No statin  Now on lipitor 80  LDL 145, goal < 70  Continue statin at discharge  Other Stroke Risk Factors  Advanced age  Former smokeless tobacco user, quit 2 yrs ago  ETOH use, advised to drink no more than 1 drink(s) a day  Overweight, Body mass index is 29.23 kg/m., recommend weight loss, diet and exercise as appropriate   Other Active Problems  Hx breast cancer s/p XRT  Depression   Hospital day # 1  She presented with 1 week of right leg weakness and dragging due to left subcortical infarct from small vessel disease.  Recommend aspirin Plavix for 3 weeks followed by aspirin alone and aggressive risk  factor modification.  Therapy consults.  Greater than 50% time during this 25-minute visit was spent on counseling and coordination of care about her lacunar stroke and discussion about prevention and treatment and answering questions. Stroke team will sign off.  Follow-up as an outpatient in the stroke clinic in 6 weeks.  Kindly call for questions. Antony Contras, MD To contact Stroke Continuity provider, please refer to [email or web address] After hours, contact General Neurology

## 2020-05-29 NOTE — Evaluation (Signed)
Speech Language Pathology Evaluation Patient Details Name: Erin Cunningham MRN: [id number] DOB: 11-Dec-1940 Today's Date: 05/29/2020 Time: 4765-4650 SLP Time Calculation (min) (ACUTE ONLY): 15 min  Problem List:  Patient Active Problem List   Diagnosis Date Noted   Infarction of left basal ganglia ([place]) 05/29/2020   Hypertensive emergency 05/28/2020   Weakness of right lower extremity 05/28/2020   Major depressive disorder 05/28/2020   Allergic rhinitis 06/16/2018   Chewing tobacco nicotine dependence with nicotine-induced disorder 06/16/2018   History of adenomatous polyp of colon 09/23/2017   Hypercalcemia 04/20/2016   Neck nodule 04/20/2016   Essential hypertension, benign 07/21/2015   Hyperlipidemia LDL goal <130 07/21/2015   Abnormal fasting glucose 07/21/2015   History of breast cancer in female 07/21/2015   Major depressive disorder, recurrent, severe without psychotic features ([place]) 06/24/2015   MDD (major depressive disorder), recurrent episode, severe ([place]) 06/24/2015   Onychomycosis 06/12/2014   Pain in lower limb 06/12/2014   Past Medical History:  Past Medical History:  Diagnosis Date   Breast CA ([place]) 2006   right ([place])   Cancer ([place])    Helicobacter pylori gastritis 2001   High blood pressure    History of adenomatous polyp of colon 09/23/2017   Oma 09/25/2017 and apparently polyps in the past as well though recalled age   History of shingles    Major depressive disorder 05/28/2020   Major depressive disorder, recurrent, severe without psychotic features ([place]) 06/24/2015   Parotid mass 2014   right benign cyst   Personal history of radiation therapy 2006   Rt breast   Wears glasses    Past Surgical History:  Past Surgical History:  Procedure Laterality Date   ABDOMINAL HYSTERECTOMY  1982   BSO   BREAST LUMPECTOMY Right 2007   BREAST LUMPECTOMY WITH SENTINEL LYMPH NODE BIOPSY  2005   right   BREAST SURGERY  2004   rt  br mass   CESAREAN SECTION     COLONOSCOPY     ESOPHAGOGASTRODUODENOSCOPY  2001   H pylori gastritis   EYE SURGERY     both cataracts   PAROTIDECTOMY Right 09/05/2013   Procedure: RIGHT SUPERFICIAL PAROTIDECTOMY WITH FACIAL NERVE DISECTION;  Surgeon: [name], MD;  Location: [facility];  Service: ENT;  Laterality: Right;  All documentation done by R.Ward after [phone] --done under G. Garzon's password   RE-EXCISION OF BREAST LUMPECTOMY  2005   right   HPI:  Pt is a 79 y.o. female with past medical history of hypertension, generalized anxiety disorder, depression, breast cancer who presented to [facility] as a transfer from [place] for hypertensive emergency and concern for acute stroke secondary to R Leg weakness. MRI brain: Small acute infarct at the posterior aspect of the left basal ganglia putamen. No acute hemorrhage or mass effect. Right frontal lobe cavernous angioma.  CTA negative for LVO.   Assessment / Plan / Recommendation Clinical Impression  Pt participated in speech/language evaluation. Pt denied any acute deficits in speech or language. Her speech and language skills are currently within normal limits and no overt cognitive-linguistic deficits were noted. Further skilled SLP services are not clinically indicated at this time. Pt and nursing were educated regarding results and recommendations; both parties verbalized understanding as well as agreement with plan of care.    SLP Assessment  SLP Recommendation/Assessment: Patient does not need any further Speech [place] [facility] SLP Visit Diagnosis: Cognitive communication deficit (R41.841)    Follow  Up Recommendations  None    Frequency and Duration           SLP Evaluation Cognition  Overall Cognitive Status: Within Functional Limits for tasks assessed Arousal/Alertness: Awake/alert Orientation Level: Oriented X4 Attention: Focused;Sustained Focused Attention:  Appears intact Sustained Attention: Appears intact Memory: Impaired Memory Impairment: Storage deficit;Retrieval deficit (Immediate: 3/3; delayed: 1/3; with cues: 2/2) Awareness: Appears intact Problem Solving: Appears intact Executive Function: Reasoning Reasoning: Appears intact       Comprehension  Auditory Comprehension Overall Auditory Comprehension: Appears within functional limits for tasks assessed Yes/No Questions: Within Functional Limits Basic Immediate Environment Questions:  (5/5) Complex Questions:  (4/5) Paragraph Comprehension (via yes/no questions):  (3/4) Commands: Within Functional Limits Two Step Basic Commands:  (3/3) Multistep Basic Commands:  (3/3) Conversation: Complex Visual Recognition/Discrimination Discrimination: Within Function Limits Reading Comprehension Reading Status: Within funtional limits    Expression Expression Primary Mode of Expression: Verbal Verbal Expression Overall Verbal Expression: Appears within functional limits for tasks assessed Initiation: No impairment Automatic Speech: Counting;Day of week;Month of year (WNL) Level of Generative/Spontaneous Verbalization: Conversation Repetition: No impairment (5/5) Naming: No impairment Responsive:  (5/5) Confrontation:  (10/10) Convergent:  (5/5) Pragmatics: No impairment Written Expression Dominant Hand: Right   Oral / Motor  Motor Speech Overall Motor Speech: Appears within functional limits for tasks assessed Respiration: Within functional limits Phonation: Normal Resonance: Within functional limits Articulation: Within functional limitis Intelligibility: Intelligible Motor Planning: Witnin functional limits Motor Speech Errors: Not applicable   Melenda Bielak I. Hardin Negus, [place], [place] Office number [phone] Pager [place] 05/29/2020, 2:18 PM

## 2020-05-29 NOTE — Evaluation (Signed)
Occupational Therapy Evaluation Patient Details Name: Erin Cunningham MRN: [id number] DOB: 12-06-1941 Today's Date: 05/29/2020    History of Present Illness Pt is 79 y.o. female admitted to ED with R Leg weakness resulting in postive results for left basal ganglia CVA. Head CT results 05/28/20 acute sinusitis.Mild chronic small vessel ischemic changes within the cerebral white matter, progressed as compared to head CT 02/28/2015. MRI results 05/29/20 Small acute infarct left basal ganglia putamen. No acute hemorrhage or mass effect. Right frontal lobe cavernous angioma. PMH hypertension, generalized anxiety disorder, depression, CA.   Clinical Impression   Patient with functional deficits listed below impacting safety/independence with self care. Patient close to baseline, supervision for functional transfers/dynamic balance due to mild unsteadiness with patient reaching out for bed frame x1 with ambulation in room. Patient reports feeling a little "whoozy" with headache, BP taken once returned to chair 166/96. Do not anticipate any further OT needs at discharge at this time.     Follow Up Recommendations  No OT follow up    Equipment Recommendations  None recommended by OT       Precautions / Restrictions Precautions Precautions: Fall Precaution Comments: Watch BP Restrictions Weight Bearing Restrictions: No      Mobility Bed Mobility              General bed mobility comments: in recliner upon arrival  Transfers Overall transfer level: Needs assistance Equipment used: None Transfers: Sit to/from Stand Sit to Stand: Supervision         General transfer comment: S for safety as patient is mildly unsteady    Balance Overall balance assessment: Needs assistance Sitting-balance support: No upper extremity supported;Feet supported Sitting balance-Leahy Scale: Normal     Standing balance support: No upper extremity supported;During functional activity Standing  balance-Leahy Scale: Good Standing balance comment: Pt required supervision for safety with dyanamic balance                         ADL either performed or assessed with clinical judgement   ADL Overall ADL's : Needs assistance/impaired     Grooming: Sitting;Independent   Upper Body Bathing: Sitting;Independent   Lower Body Bathing: Supervison/ safety;Sit to/from stand   Upper Body Dressing : Sitting;Independent   Lower Body Dressing: Supervision/safety;Sitting/lateral leans;Sit to/from stand Lower Body Dressing Details (indicate cue type and reason): able to doff/don socks seated in recliner Toilet Transfer: Supervision/safety;Ambulation Toilet Transfer Details (indicate cue type and reason): S for safety due to mild unsteadiness ambulate in room without AD Toileting- Clothing Manipulation and Hygiene: Supervision/safety;Sit to/from stand       Functional mobility during ADLs: Supervision/safety       Vision Baseline Vision/History: Wears glasses Wears Glasses:  (for reading and distance, pt reports she is near sighted ) Patient Visual Report: No change from baseline Vision Assessment?: No apparent visual deficits            Pertinent Vitals/Pain Pain Assessment: Faces Faces Pain Scale: Hurts little more Pain Location: headache Pain Descriptors / Indicators: Headache Pain Intervention(s): Monitored during session     Hand Dominance Right   Extremity/Trunk Assessment Upper Extremity Assessment Upper Extremity Assessment: Overall WFL for tasks assessed   Lower Extremity Assessment Lower Extremity Assessment: Defer to PT evaluation    Cervical / Trunk Assessment Cervical / Trunk Assessment: Normal   Communication Communication Communication: No difficulties   Cognition Arousal/Alertness: Awake/alert Behavior During Therapy: WFL for tasks assessed/performed Overall Cognitive Status: Within Functional  Limits for tasks assessed                                                 Home Living Family/patient expects to be discharged to:: Private residence Living Arrangements: Spouse/significant other;Other relatives (grandchildren) Available Help at Discharge: Family Type of Home: House Home Access: Stairs to enter     Home Layout: Two level Alternate Level Stairs-Number of Steps: flight of steps             Home Equipment: None          Prior Functioning/Environment Level of Independence: Independent        Comments: Works as a Corporate treasurer for [employer]        OT Problem List: Impaired balance (sitting and/or standing)      OT Treatment/Interventions: Self-care/ADL training;Therapeutic exercise;DME and/or AE instruction;Therapeutic activities;Energy conservation;Patient/family education;Balance training    OT Goals(Current goals can be found in the care plan section) Acute Rehab OT Goals Patient Stated Goal: Go home OT Goal Formulation: With patient Time For Goal Achievement: 06/12/20 Potential to Achieve Goals: Good  OT Frequency: Min 2X/week    AM-PAC OT "6 Clicks" Daily Activity     Outcome Measure Help from another person eating meals?: None Help from another person taking care of personal grooming?: None Help from another person toileting, which includes using toliet, bedpan, or urinal?: A Little Help from another person bathing (including washing, rinsing, drying)?: A Little Help from another person to put on and taking off regular upper body clothing?: None Help from another person to put on and taking off regular lower body clothing?: A Little 6 Click Score: 21   End of Session Nurse Communication: Mobility status  Activity Tolerance: Patient tolerated treatment well Patient left: in chair;with call bell/phone within reach  OT Visit Diagnosis: Other abnormalities of gait and mobility (R26.89);Other symptoms and signs involving the nervous system (R29.898)                 Time: 0388-8280 OT Time Calculation (min): 16 min Charges:  OT General Charges $OT Visit: 1 Visit OT Evaluation $OT Eval Low Complexity: [place] OT OT office: [place] 05/29/2020, 12:46 PM

## 2020-05-29 NOTE — Evaluation (Signed)
Physical Therapy Evaluation Patient Details Name: Erin Cunningham MRN: [id number] DOB: 12-Feb-1941 Today's Date: 05/29/2020   History of Present Illness  Pt is 79 y.o. female admitted to ED with R Leg weakness resulting in postive results for left basal ganglia CVA. Head CT results 05/28/20 acute sinusitis.Mild chronic small vessel ischemic changes within the cerebral white matter, progressed as compared to head CT 02/28/2015. MRI results 05/29/20 Small acute infarct left basal ganglia putamen. No acute hemorrhage or mass effect. Right frontal lobe cavernous angioma. PMH hypertension, generalized anxiety disorder, depression, CA.  Clinical Impression  Pt presents with an overall decrease in functional mobility and decrease in balance secondary to above. 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 Educ on LE therex to complete while sitting in bed. Today, pt able to complete bed mobility and transfers indepedently. Pt required supervision for amb without assistive device due to slight unsteadiness and vital signs. Pt modified DGI score 18/24 indicating increased risk for falls. Pt would benefit from continued acute PT services to maximize functional mobility and independence prior to d/c home.     Follow Up Recommendations No PT follow up    Equipment Recommendations  None recommended by PT    Recommendations for Other Services       Precautions / Restrictions Precautions Precautions: Fall Precaution Comments: Watch BP      Mobility  Bed Mobility Overal bed mobility: Independent                Transfers Overall transfer level: Independent Equipment used: None                Ambulation/Gait Ambulation/Gait assistance: Supervision Gait Distance (Feet): 150 Feet Assistive device: None Gait Pattern/deviations: Step-through pattern;Narrow base of support;Decreased stride length Gait velocity: decreased   General Gait Details: Shortened stride  length, hands clasped together. Supervision for safety due to increased BP and headache.  Stairs            Wheelchair Mobility    Modified Rankin (Stroke Patients Only) Modified Rankin (Stroke Patients Only) Pre-Morbid Rankin Score: No symptoms Modified Rankin: Moderately severe disability     Balance Overall balance assessment: Needs assistance   Sitting balance-Leahy Scale: Normal       Standing balance-Leahy Scale: Good Standing balance comment: Pt required supervision for safety with dyanamic balance, pt indepedent with static standing balance                 Standardized Balance Assessment Standardized Balance Assessment : Dynamic Gait Index   Dynamic Gait Index Level Surface: Mild Impairment Change in Gait Speed: Mild Impairment Gait with Horizontal Head Turns: Normal Gait with Vertical Head Turns: Normal Gait and Pivot Turn: Mild Impairment (infered) Step Over Obstacle: Mild Impairment Step Around Obstacles: Mild Impairment (infered) Steps: Mild Impairment (modified) Total Score: 18       Pertinent Vitals/Pain Pain Assessment: Faces Faces Pain Scale: Hurts little more Pain Location: headache Pain Descriptors / Indicators: Headache Pain Intervention(s): Limited activity within patient's tolerance;Monitored during session (Pt reported HA pain decreased with movement)    Home Living Family/patient expects to be discharged to:: Private residence Living Arrangements: Spouse/significant other;Other relatives (grandchildren) Available Help at Discharge: Family Type of Home: House Home Access: Stairs to enter     Home Layout: Two level Home Equipment: None      Prior Function Level of Independence: Independent         Comments: Works as a Corporate treasurer  for [employer]        Extremity/Trunk Assessment   Upper Extremity Assessment Upper Extremity Assessment: Overall WFL for tasks assessed    Lower  Extremity Assessment Lower Extremity Assessment: RLE deficits/detail;LLE deficits/detail RLE Deficits / Details: Appeared at least 3/5 mmt, Pt reported she does not feel like her RLE weaker than left currently, feels like she is moving better each day RLE Coordination: WNL LLE Deficits / Details: Appeared at least 3/5 mmt, Pt reported she does not feel like her RLE weaker than left currently, feels like she is moving better each day LLE Coordination: WNL    Cervical / Trunk Assessment Cervical / Trunk Assessment: Normal  Communication   Communication: No difficulties  Cognition Arousal/Alertness: Awake/alert Behavior During Therapy: WFL for tasks assessed/performed Overall Cognitive Status: Within Functional Limits for tasks assessed                                        General Comments General comments (skin integrity, edema, etc.): No abnormal skin integumentary noted, Pt BP elevated throughout session with HA and nausea, nurse notified and approved amb.    Exercises Total Joint Exercises Ankle Circles/Pumps: AROM;20 reps;Both;Seated Quad Sets: AROM;20 reps;Both;Seated Hip ABduction/ADduction: AROM;20 reps;Both;Seated   Assessment/Plan    PT Assessment Patient needs continued PT services  PT Problem List Decreased mobility;Decreased balance;Cardiopulmonary status limiting activity       PT Treatment Interventions DME instruction;Therapeutic activities;Gait training;Therapeutic exercise;Patient/family education;Stair training;Balance training;Functional mobility training;Neuromuscular re-education    PT Goals (Current goals can be found in the Care Plan section)  Acute Rehab PT Goals Patient Stated Goal: Go home PT Goal Formulation: With patient Time For Goal Achievement: 06/12/20 Potential to Achieve Goals: Good    Frequency Min 3X/week   Barriers to discharge   No barriers to d/c    Co-evaluation               AM-PAC PT "6 Clicks"  Mobility  Outcome Measure Help needed turning from your back to your side while in a flat bed without using bedrails?: None Help needed moving from lying on your back to sitting on the side of a flat bed without using bedrails?: None Help needed moving to and from a bed to a chair (including a wheelchair)?: None Help needed standing up from a chair using your arms (e.g., wheelchair or bedside chair)?: None Help needed to walk in hospital room?: None Help needed climbing 3-5 steps with a railing? : None 6 Click Score: 24    End of Session Equipment Utilized During Treatment: Gait belt Activity Tolerance: Patient tolerated treatment well Patient left: with call bell/phone within reach;in chair Nurse Communication: Mobility status;Other (comment) (vital signs) PT Visit Diagnosis: Other abnormalities of gait and mobility (R26.89);Unsteadiness on feet (R26.81)    Time: 6606-0045 PT Time Calculation (min) (ACUTE ONLY): 29 min   Charges:   PT Evaluation $PT Eval Moderate Complexity: 1 Mod PT Treatments $Gait Training: 8-22 mins        [employer] SPT 05/29/2020   Rolland Porter 05/29/2020, 12:31 PM

## 2020-05-29 NOTE — Plan of Care (Signed)
  Problem: Education: Goal: Knowledge of General Education information will improve Description: Including pain rating scale, medication(s)/side effects and non-pharmacologic comfort measures Outcome: Progressing   Problem: Health Behavior/Discharge Planning: Goal: Ability to manage health-related needs will improve Outcome: Progressing   Problem: Clinical Measurements: Goal: Ability to maintain clinical measurements within normal limits will improve Outcome: Progressing   Problem: Clinical Measurements: Goal: Diagnostic test results will improve Outcome: Progressing   Problem: Clinical Measurements: Goal: Cardiovascular complication will be avoided Outcome: Progressing   Problem: Activity: Goal: Risk for activity intolerance will decrease Outcome: Progressing   Problem: Nutrition: Goal: Adequate nutrition will be maintained Outcome: Progressing   Problem: Coping: Goal: Level of anxiety will decrease Outcome: Progressing   Problem: Pain Managment: Goal: General experience of comfort will improve Outcome: Progressing   Problem: Education: Goal: Knowledge of disease or condition will improve Outcome: Progressing   Problem: Health Behavior/Discharge Planning: Goal: Ability to manage health-related needs will improve Outcome: Progressing   Problem: Ischemic Stroke/TIA Tissue Perfusion: Goal: Complications of ischemic stroke/TIA will be minimized Outcome: Progressing

## 2020-05-30 DIAGNOSIS — E785 Hyperlipidemia, unspecified: Secondary | ICD-10-CM

## 2020-05-30 MED ORDER — CARVEDILOL 6.25 MG PO TABS: 6.2500 mg | ORAL_TABLET | Freq: Two times a day (BID) | ORAL | 1 refills | Status: DC

## 2020-05-30 MED ORDER — HYDRALAZINE HCL 20 MG/ML IJ SOLN
10.0000 mg | Freq: Four times a day (QID) | INTRAMUSCULAR | Status: DC | PRN
  Administered 2020-05-30: 10 mg via INTRAVENOUS
  Filled 2020-05-30: qty 1

## 2020-05-30 MED ORDER — BUTALBITAL-APAP-CAFFEINE 50-325-40 MG PO TABS
2.0000 | ORAL_TABLET | Freq: Once | ORAL | Status: AC
  Administered 2020-05-30: 2 via ORAL
  Filled 2020-05-30: qty 2

## 2020-05-30 MED ORDER — CLOPIDOGREL BISULFATE 75 MG PO TABS: 75.0000 mg | ORAL_TABLET | Freq: Every day | ORAL | 0 refills | Status: DC

## 2020-05-30 MED ORDER — LOSARTAN POTASSIUM 100 MG PO TABS: 100.0000 mg | ORAL_TABLET | Freq: Every day | ORAL | 3 refills | Status: DC

## 2020-05-30 MED ORDER — CARVEDILOL 6.25 MG PO TABS: 6.2500 mg | ORAL_TABLET | Freq: Two times a day (BID) | ORAL | Status: DC

## 2020-05-30 MED ORDER — ATORVASTATIN CALCIUM 80 MG PO TABS: 80.0000 mg | ORAL_TABLET | Freq: Every day | ORAL | 1 refills | Status: DC

## 2020-05-30 MED ORDER — ASPIRIN 81 MG PO TBEC: 81.0000 mg | DELAYED_RELEASE_TABLET | Freq: Every day | ORAL | 1 refills | Status: DC

## 2020-05-30 MED FILL — CLOPIDOGREL 75 MG TABLET: 75 | 21 days supply | Qty: 21 | Fill #0

## 2020-05-30 MED FILL — CARVEDILOL 6.25 MG TABLET: 6.25 | 90 days supply | Qty: 180 | Fill #0

## 2020-05-30 MED FILL — ATORVASTATIN CALCIUM 80 MG: 80 | 90 days supply | Qty: 90 | Fill #0

## 2020-05-30 NOTE — Discharge Summary (Signed)
Physician Discharge Summary  BECKEY POLKOWSKI GNO:[id number] DOB: 11-03-1941 DOA: 05/28/2020  PCP: Lauree Chandler, NP  Admit date: 05/28/2020 Discharge date: 05/30/2020  Admitted From: Home Disposition: Home  Recommendations for Outpatient Follow-up:  1. Follow ups as below. 2. Please obtain CBC/BMP/Mag at follow up 3. Please follow up on the following pending results: None  Home Health: None Equipment/Devices: None  Discharge Condition: Stable CODE STATUS: full code   Follow-up Information    Lauree Chandler, NP. Schedule an appointment as soon as possible for a visit in 1 week(s).   Specialty: Geriatric Medicine Contact information: [place] [address] [phone]        [facility]. Schedule an appointment as soon as possible for a visit in 4 week(s).   Specialty: Neurology Contact information: [place] Hospital Course: 79 year old female with history of HTN, GAD, depression and breast cancer s/p chemoradiation presented to [facility] ED with elevated blood pressure and progressive recurrent right-sided weakness for about a week.   In ED, hypertensive to 226/95.  CT head and CTA head and neck without acute finding.  MRI brain with left basal ganglia/putamen infarct and right frontal lobe cavernous angioma.  Patient was started on nitro drip and admitted to [facility] for hypertensive emergency and acute CVA.   Echocardiogram revealed some dynamic LVOT obstruction but no thrombus or shunt. Cardiology, Dr. Terrence Dupont recommended low-dose beta-blocker and outpatient follow-up with cardiology. LDL 145. A1c 5.6%. Blood pressure improved. Symptoms resolved. She felt well immediately to go home. Neurology recommended permissive hypertension, statin, DAPT with low-dose aspirin and Plavix for 3 weeks followed by low-dose aspirin.   See individual problem list  below for more on hospital course.  Discharge Diagnoses:  Acute left basal ganglia/thalamic infarct: presented with intermittent right-sided weakness for 1 week.  CTA head and neck without significant stenosis.  Echo as below.  LDL 145.  A1c 5.6%. -Continue Plavix and aspirin for 3 weeks followed by low-dose aspirin.  -Continue atorvastatin 80 mg daily -Outpatient follow-up with urology in 4 to 6 weeks  Hypertensive emergency-BP 222/96 on arrival.  Improved -Continue losartan for 3 to 4 days after discharge -Added low-dose Coreg for possible HCM as noted on Echo  Hypertrophic cardiomyopathy: Echo with EF of 55 to 70%, G1 DD, moderate concentric LVH, dynamic LVOT obstruction with peak gradient of 70 that increased to 35 with Valsalva.  -Low-dose Coreg and outpatient follow-up with Dr. Terrence Dupont per his recommendation  Anxiety and depression: Stable -Continue home medications  History of breast cancer s/p chemoradiation  Right frontal lobe cavernous angioma-incidental finding.  Hyperlipidemia: LDL 141. -Atorvastatin 80 mg daily   Body mass index is 29.23 kg/m.            Discharge Exam: Vitals:   05/30/20 0603 05/30/20 0715  BP: 138/67 (!) 143/88  Pulse: 69 91  Resp:  18  Temp:  98.7 F (37.1 C)  SpO2:  97%    GENERAL: No apparent distress.  Nontoxic. HEENT: MMM.  Vision and hearing grossly intact.  NECK: Supple.  No apparent JVD.  RESP:  No IWOB.  Fair aeration bilaterally. CVS:  RRR. Heart sounds normal.  ABD/GI/GU: Bowel sounds present. Soft. Non tender.  MSK/EXT:  Moves extremities. No apparent deformity. No edema.  SKIN: no apparent skin lesion or wound NEURO: Awake, alert and oriented appropriately.  No apparent focal neuro deficit. PSYCH: Calm. Normal affect.  Discharge Instructions  Discharge Instructions    Diet - low sodium heart healthy   Complete by: As directed    Discharge instructions   Complete by: As directed    It has been a pleasure  taking care of you!  You were hospitalized due to right-sided weakness likely due to stroke as noted on MRI of your brain.  Your stroke could be related to your uncontrolled blood pressure.  We have started you on new medication to reduce the risk of further stroke.  It is very important that you take your medications as prescribed.    We may have started you on other new medications or made some changes to your home medications during this hospitalization. Please review your new medication list and the directions carefully before you take them.    Please go to your hospital follow-up appointments or call to reschedule as recommended.   Take care,   Increase activity slowly   Complete by: As directed      Allergies as of 05/30/2020   No Known Allergies     Medication List    TAKE these medications   aspirin 81 MG EC tablet Take 1 tablet (81 mg total) by mouth daily. Swallow whole.   atorvastatin 80 MG tablet Commonly known as: LIPITOR Take 1 tablet (80 mg total) by mouth daily.   CALTRATE 600+D PO Take 1 tablet by mouth in the morning and at bedtime. Take one by mouth twice daily.   carvedilol 6.25 MG tablet Commonly known as: COREG Take 1 tablet (6.25 mg total) by mouth 2 (two) times daily with a meal.   Centrum MultiGummies Chew Chew 2 tablets by mouth daily.   clopidogrel 75 MG tablet Commonly known as: PLAVIX Take 1 tablet (75 mg total) by mouth daily.   losartan 100 MG tablet Commonly known as: COZAAR Take 1 tablet (100 mg total) by mouth daily. Start taking on: June 02, 2020 What changed: These instructions start on June 02, 2020. If you are unsure what to do until then, ask your doctor or other care provider.   sertraline 50 MG tablet Commonly known as: ZOLOFT Take 1.5 tablets (75 mg total) by mouth daily.   traZODone 50 MG tablet Commonly known as: DESYREL Take 1 tablet (50 mg total) by mouth at bedtime as needed for sleep.        Consultations:  Neurology  Cardiology over the phone  Procedures/Studies:  2D Echo with bubble study 1. There is a dynamic LVOT obstruction. Peak gradient of 17 mmHg. The  gradient increases to 35 mmHg with Valsalva.   . Left ventricular ejection fraction, by estimation, is 65 to 70%. The  left ventricle has normal function. The left ventricle has no regional  wall motion abnormalities. There is moderate concentric left ventricular  hypertrophy. Left ventricular  diastolic parameters are consistent with Grade I diastolic dysfunction  (impaired relaxation).  2. Right ventricular systolic function is normal. The right ventricular  size is normal. There is normal pulmonary artery systolic pressure.  3. The mitral valve is grossly normal. No evidence of mitral valve  regurgitation. No evidence of mitral stenosis.  4. Tricuspid valve regurgitation is mild to moderate.  5. The aortic valve is tricuspid. Aortic valve regurgitation is not  visualized. No aortic stenosis is present.  No atrial level shunt detected   CT ANGIO HEAD W OR WO CONTRAST  Result Date: 05/29/2020 CLINICAL DATA:  Right lower extremity weakness. EXAM: CT ANGIOGRAPHY HEAD AND NECK TECHNIQUE: Multidetector CT imaging of the head and neck was performed using the standard protocol during bolus administration of intravenous contrast. Multiplanar CT image reconstructions and MIPs were obtained to evaluate the vascular anatomy. Carotid stenosis measurements (when applicable) are obtained utilizing NASCET criteria, using the distal internal carotid diameter as the denominator. CONTRAST:  43mL OMNIPAQUE IOHEXOL 350 MG/ML SOLN COMPARISON:  None. Brain MRI 05/29/2020 FINDINGS: CTA NECK FINDINGS SKELETON: There is no bony spinal canal stenosis. No lytic or blastic lesion. OTHER NECK: Normal pharynx, larynx and major salivary glands. No cervical lymphadenopathy. Unremarkable thyroid gland. UPPER CHEST: No pneumothorax or  pleural effusion. No nodules or masses. AORTIC ARCH: There is mild calcific atherosclerosis of the aortic arch. There is no aneurysm, dissection or hemodynamically significant stenosis of the visualized portion of the aorta. Conventional 3 vessel aortic branching pattern. The visualized proximal subclavian arteries are widely patent. RIGHT CAROTID SYSTEM: Normal without aneurysm, dissection or stenosis. LEFT CAROTID SYSTEM: Normal without aneurysm, dissection or stenosis. VERTEBRAL ARTERIES: Left dominant configuration. Both origins are clearly patent. There is no dissection, occlusion or flow-limiting stenosis to the skull base (V1-V3 segments). CTA HEAD FINDINGS POSTERIOR CIRCULATION: --Vertebral arteries: Normal V4 segments. --Inferior cerebellar arteries: Normal. --Basilar artery: Normal. --Superior cerebellar arteries: Normal. --Posterior cerebral arteries (PCA): Normal. ANTERIOR CIRCULATION: --Intracranial internal carotid arteries: Normal. --Anterior cerebral arteries (ACA): Normal. Both A1 segments are present. Patent anterior communicating artery (a-comm). --Middle cerebral arteries (MCA): Normal. VENOUS SINUSES: As permitted by contrast timing, patent. ANATOMIC VARIANTS: None Review of the MIP images confirms the above findings. IMPRESSION: 1. No emergent large vessel occlusion or high-grade stenosis of the intracranial or cervical arteries. 2. Aortic Atherosclerosis (ICD10-I70.0). Electronically Signed   By: Ulyses Jarred M.D.   On: 05/29/2020 02:10   CT Head Wo Contrast  Result Date: 05/28/2020 CLINICAL DATA:  Headache, acute, normal neuro exam. Additional provided: Dizziness, off balance for 1 week, elevated blood pressure. EXAM: CT HEAD WITHOUT CONTRAST TECHNIQUE: Contiguous axial images were obtained from the base of the skull through the vertex without intravenous contrast. COMPARISON:  Head CT 02/28/2015, brain MRI 03/23/2005, head CT 02/27/2005 FINDINGS: Brain: As before, there is slight  prominence of the lateral ventricles. However, the temporal horns remain normal in size and there is no convincing evidence of hydrocephalus. Mild patchy hypoattenuation within the cerebral white matter is nonspecific, but consistent with chronic small vessel ischemic disease. Findings have progressed as compared to prior head CT 02/28/2015. Redemonstrated faint hyperdensity within the right frontal lobe at site of a known cavernoma, previously demonstrated on MRI 03/23/2005. There is no acute intracranial hemorrhage. No demarcated cortical infarct. No extra-axial fluid collection. No evidence of intracranial mass. No midline shift. Vascular: No convincing hyperdense vessel. Skull: Normal. Negative for fracture or focal lesion. Sinuses/Orbits: The orbits are largely excluded from the field of view. No evidence of acute orbital abnormality. Partially imaged mucosal thickening within the ethmoid and maxillary sinuses. Partially imaged frothy secretions within the right maxillary sinus. No significant mastoid effusion. IMPRESSION: No evidence of acute intracranial abnormality. Mild chronic small vessel ischemic changes within the cerebral white matter, progressed as compared to head CT 02/28/2015. Redemonstrated hyperdensity in the region of a known right frontal lobe cavernoma. Paranasal sinus disease as described. Correlate for acute sinusitis. Electronically Signed   By: Kellie Simmering DO   On: 05/28/2020 14:12   CT ANGIO NECK W OR WO CONTRAST  Result Date: 05/29/2020 CLINICAL DATA:  Right lower extremity weakness. EXAM: CT ANGIOGRAPHY HEAD AND NECK TECHNIQUE: Multidetector CT imaging of the head and neck was performed using the standard protocol during bolus administration of intravenous contrast. Multiplanar CT image reconstructions and MIPs were obtained to evaluate the vascular anatomy. Carotid stenosis measurements (when applicable) are obtained utilizing NASCET criteria, using the distal internal carotid  diameter as the denominator. CONTRAST:  36mL OMNIPAQUE IOHEXOL 350 MG/ML SOLN COMPARISON:  None. Brain MRI 05/29/2020 FINDINGS: CTA NECK FINDINGS SKELETON: There is no bony spinal canal stenosis. No lytic or blastic lesion. OTHER NECK: Normal pharynx, larynx and major salivary glands. No cervical lymphadenopathy. Unremarkable thyroid gland. UPPER CHEST: No pneumothorax or pleural effusion. No nodules or masses. AORTIC ARCH: There is mild calcific atherosclerosis of the aortic arch. There is no aneurysm, dissection or hemodynamically significant stenosis of the visualized portion of the aorta. Conventional 3 vessel aortic branching pattern. The visualized proximal subclavian arteries are widely patent. RIGHT CAROTID SYSTEM: Normal without aneurysm, dissection or stenosis. LEFT CAROTID SYSTEM: Normal without aneurysm, dissection or stenosis. VERTEBRAL ARTERIES: Left dominant configuration. Both origins are clearly patent. There is no dissection, occlusion or flow-limiting stenosis to the skull base (V1-V3 segments). CTA HEAD FINDINGS POSTERIOR CIRCULATION: --Vertebral arteries: Normal V4 segments. --Inferior cerebellar arteries: Normal. --Basilar artery: Normal. --Superior cerebellar arteries: Normal. --Posterior cerebral arteries (PCA): Normal. ANTERIOR CIRCULATION: --Intracranial internal carotid arteries: Normal. --Anterior cerebral arteries (ACA): Normal. Both A1 segments are present. Patent anterior communicating artery (a-comm). --Middle cerebral arteries (MCA): Normal. VENOUS SINUSES: As permitted by contrast timing, patent. ANATOMIC VARIANTS: None Review of the MIP images confirms the above findings. IMPRESSION: 1. No emergent large vessel occlusion or high-grade stenosis of the intracranial or cervical arteries. 2. Aortic Atherosclerosis (ICD10-I70.0). Electronically Signed   By: Ulyses Jarred M.D.   On: 05/29/2020 02:10   MR BRAIN WO CONTRAST  Result Date: 05/29/2020 CLINICAL DATA:  Right lower extremity  weakness EXAM: MRI HEAD WITHOUT CONTRAST TECHNIQUE: Multiplanar, multiecho pulse sequences of the brain and surrounding structures were obtained without intravenous contrast. COMPARISON:  Brain MRI 03/23/2005 Head CT 05/28/2020 FINDINGS: Brain: There is a small acute infarct at the posterior aspect of the left basal ganglia putamen. There is magnetic susceptibility effect in the right frontal lobe at the site of known cavernous angioma. Multifocal white matter hyperintensity, most commonly due to chronic ischemic microangiopathy. There is generalized atrophy without lobar predilection. No chronic microhemorrhage. Normal midline structures. Vascular: Normal flow voids. Skull and upper cervical spine: Normal marrow signal. Sinuses/Orbits: Moderate paranasal sinus disease. Bilateral ocular lens replacements. Other: None IMPRESSION: 1. Small acute infarct at the posterior aspect of the left basal ganglia putamen. No acute hemorrhage or mass effect. 2. Right frontal lobe cavernous angioma. Electronically Signed   By: Ulyses Jarred M.D.   On: 05/29/2020 01:25   ECHOCARDIOGRAM COMPLETE BUBBLE STUDY  Result Date: 05/29/2020    ECHOCARDIOGRAM REPORT   Patient Name:   Erin Cunningham Date of Exam: 05/29/2020 Medical Rec #:  [id number]      Height:       63.0 in Accession #:    [id number]     Weight:       165.0 lb Date of Birth:  [birth year]      BSA:          1.782 m Patient Age:    79 years       BP:           146/75 mmHg Patient Gender: F  HR:           81 bpm. Exam Location:  Inpatient Procedure: 2D Echo, Cardiac Doppler, Color Doppler and Saline Contrast Bubble            Study Indications:    Stroke  History:        Patient has no prior history of Echocardiogram examinations.                 Risk Factors:Hypertension and Dyslipidemia.  Sonographer:    Roseanna Rainbow RDCS Referring Phys: [id number] [place]  1. There is a dynamic LVOT obstruction. Peak gradient of 17 mmHg. The gradient increases  to 35 mmHg with Valsalva.     . Left ventricular ejection fraction, by estimation, is 65 to 70%. The left ventricle has normal function. The left ventricle has no regional wall motion abnormalities. There is moderate concentric left ventricular hypertrophy. Left ventricular diastolic parameters are consistent with Grade I diastolic dysfunction (impaired relaxation).  2. Right ventricular systolic function is normal. The right ventricular size is normal. There is normal pulmonary artery systolic pressure.  3. The mitral valve is grossly normal. No evidence of mitral valve regurgitation. No evidence of mitral stenosis.  4. Tricuspid valve regurgitation is mild to moderate.  5. The aortic valve is tricuspid. Aortic valve regurgitation is not visualized. No aortic stenosis is present. FINDINGS  Left Ventricle: There is a dynamic LVOT obstruction. Peak gradient of 17 mmHg. The gradient increases to 35 mmHg with Valsalva. Left ventricular ejection fraction, by estimation, is 65 to 70%. The left ventricle has normal function. The left ventricle has no regional wall motion abnormalities. The left ventricular internal cavity size was small. There is moderate concentric left ventricular hypertrophy. Left ventricular diastolic parameters are consistent with Grade I diastolic dysfunction (impaired relaxation). Right Ventricle: The right ventricular size is normal. No increase in right ventricular wall thickness. Right ventricular systolic function is normal. There is normal pulmonary artery systolic pressure. The tricuspid regurgitant velocity is 2.57 m/s, and  with an assumed right atrial pressure of 8 mmHg, the estimated right ventricular systolic pressure is 61.4 mmHg. Left Atrium: Left atrial size was normal in size. Right Atrium: Right atrial size was normal in size. Pericardium: There is no evidence of pericardial effusion. Mitral Valve: The mitral valve is grossly normal. No evidence of mitral valve regurgitation. No  evidence of mitral valve stenosis. Tricuspid Valve: The tricuspid valve is normal in structure. Tricuspid valve regurgitation is mild to moderate. No evidence of tricuspid stenosis. Aortic Valve: The aortic valve is tricuspid. . There is mild thickening and mild calcification of the aortic valve. Aortic valve regurgitation is not visualized. No aortic stenosis is present. Mild aortic valve annular calcification. There is mild thickening of the aortic valve. There is mild calcification of the aortic valve. Pulmonic Valve: The pulmonic valve was grossly normal. Pulmonic valve regurgitation is not visualized. No evidence of pulmonic stenosis. Aorta: The aortic root and ascending aorta are structurally normal, with no evidence of dilitation. IAS/Shunts: No atrial level shunt detected by color flow Doppler. Agitated saline contrast was given intravenously to evaluate for intracardiac shunting.  LEFT VENTRICLE PLAX 2D LVIDd:         2.60 cm     Diastology LVIDs:         1.80 cm     LV e' lateral:   5.87 cm/s LV PW:         1.40 cm  LV E/e' lateral: 11.6 LV IVS:        1.30 cm     LV e' medial:    4.79 cm/s LVOT diam:     1.70 cm     LV E/e' medial:  14.2 LV SV:         47 LV SV Index:   26 LVOT Area:     2.27 cm  LV Volumes (MOD) LV vol d, MOD A2C: 48.5 ml LV vol d, MOD A4C: 54.2 ml LV vol s, MOD A2C: 11.4 ml LV vol s, MOD A4C: 11.4 ml LV SV MOD A2C:     37.1 ml LV SV MOD A4C:     54.2 ml LV SV MOD BP:      39.2 ml RIGHT VENTRICLE             IVC RV S prime:     11.84 cm/s  IVC diam: 0.90 cm TAPSE (M-mode): 2.5 cm LEFT ATRIUM             Index       RIGHT ATRIUM           Index LA diam:        2.20 cm 1.23 cm/m  RA Area:     11.10 cm LA Vol (A2C):   30.4 ml 17.06 ml/m RA Volume:   21.70 ml  12.18 ml/m LA Vol (A4C):   9.9 ml  5.56 ml/m LA Biplane Vol: 18.1 ml 10.16 ml/m  AORTIC VALVE LVOT Vmax:   101.00 cm/s LVOT Vmean:  63.700 cm/s LVOT VTI:    0.206 m  AORTA Ao Root diam: 3.00 cm Ao Asc diam:  3.00 cm MITRAL  VALVE                TRICUSPID VALVE MV Area (PHT): 2.13 cm     TR Peak grad:   26.4 mmHg MV Decel Time: 356 msec     TR Vmax:        257.00 cm/s MV E velocity: 67.90 cm/s MV A velocity: 109.00 cm/s  SHUNTS MV E/A ratio:  0.62         Systemic VTI:  0.21 m                             Systemic Diam: 1.70 cm Mertie Moores MD Electronically signed by Mertie Moores MD Signature Date/Time: 05/29/2020/9:09:20 AM    Final         The results of significant diagnostics from this hospitalization (including imaging, microbiology, ancillary and laboratory) are listed below for reference.     Microbiology: Recent Results (from the past 240 hour(s))  SARS Coronavirus 2 by RT PCR (hospital order, performed in [facility] hospital lab) Nasopharyngeal Nasopharyngeal Swab     Status: None   Collection Time: 05/28/20  5:02 PM   Specimen: Nasopharyngeal Swab  Result Value Ref Range Status   SARS Coronavirus 2 NEGATIVE NEGATIVE Final    Comment: (NOTE) SARS-CoV-2 target nucleic acids are NOT DETECTED.  The SARS-CoV-2 RNA is generally detectable in upper and lower respiratory specimens during the acute phase of infection. The lowest concentration of SARS-CoV-2 viral copies this assay can detect is 250 copies / mL. A negative result does not preclude SARS-CoV-2 infection and should not be used as the sole basis for treatment or other patient management decisions.  A negative result may occur with improper specimen collection / handling, submission of  specimen other than nasopharyngeal swab, presence of viral mutation(s) within the areas targeted by this assay, and inadequate number of viral copies (<250 copies / mL). A negative result must be combined with clinical observations, patient history, and epidemiological information.  Fact Sheet for Patients:   StrictlyIdeas.no  Fact Sheet for Healthcare Providers: BankingDealers.co.za  This test is not yet  approved or  cleared by the [place] FDA and has been authorized for detection and/or diagnosis of SARS-CoV-2 by FDA under an Emergency Use Authorization (EUA).  This EUA will remain in effect (meaning this test can be used) for the duration of the COVID-19 declaration under Section 564(b)(1) of the Act, 21 U.S.C. section 360bbb-3(b)(1), unless the authorization is terminated or revoked sooner.  Performed at [facility], [address] [place], [place] [address]   MRSA PCR Screening     Status: None   Collection Time: 05/28/20  8:17 PM   Specimen: Nasopharyngeal  Result Value Ref Range Status   MRSA by PCR NEGATIVE NEGATIVE Final    Comment:        The GeneXpert MRSA Assay (FDA approved for NASAL specimens only), is one component of a comprehensive MRSA colonization surveillance program. It is not intended to diagnose MRSA infection nor to guide or monitor treatment for MRSA infections. Performed at [facility], [place] [address], [place], [place] [address]      Labs: BNP (last 3 results) No results for input(s): BNP in the last 8760 hours. Basic Metabolic Panel: Recent Labs  Lab 05/28/20 1201  NA 139  K 4.0  CL 105  CO2 26  GLUCOSE 102*  BUN 15  CREATININE 0.86  CALCIUM 10.2   Liver Function Tests: No results for input(s): AST, ALT, ALKPHOS, BILITOT, PROT, ALBUMIN in the last 168 hours. No results for input(s): LIPASE, AMYLASE in the last 168 hours. No results for input(s): AMMONIA in the last 168 hours. CBC: Recent Labs  Lab 05/28/20 1201  WBC 5.9  NEUTROABS 3.0  HGB 14.4  HCT 45.3  MCV 90.1  PLT 322   Cardiac Enzymes: No results for input(s): CKTOTAL, CKMB, CKMBINDEX, TROPONINI in the last 168 hours. BNP: Invalid input(s): POCBNP CBG: No results for input(s): GLUCAP in the last 168 hours. D-Dimer No results for input(s): DDIMER in the last 72 hours. Hgb A1c Recent Labs    05/29/20 0543  HGBA1C 5.6   Lipid  Profile Recent Labs    05/29/20 0543  CHOL 211*  HDL 44  LDLCALC 145*  TRIG 111  CHOLHDL 4.8   Thyroid function studies No results for input(s): TSH, T4TOTAL, T3FREE, THYROIDAB in the last 72 hours.  Invalid input(s): FREET3 Anemia work up No results for input(s): VITAMINB12, FOLATE, FERRITIN, TIBC, IRON, RETICCTPCT in the last 72 hours. Urinalysis    Component Value Date/Time   COLORURINE DARK YELLOW 06/13/2018 0910   APPEARANCEUR CLOUDY (A) 06/13/2018 0910   APPEARANCEUR Cloudy (A) 02/12/2015 0907   LABSPEC 1.025 06/13/2018 0910   PHURINE 5.5 06/13/2018 0910   GLUCOSEU NEGATIVE 06/13/2018 0910   HGBUR NEGATIVE 06/13/2018 0910   BILIRUBINUR Negative 02/12/2015 0907   KETONESUR NEGATIVE 06/13/2018 0910   PROTEINUR TRACE (A) 06/13/2018 0910   NITRITE NEGATIVE 06/13/2018 0910   LEUKOCYTESUR 3+ (A) 06/13/2018 0910   LEUKOCYTESUR 3+ (A) 02/12/2015 0907   Sepsis Labs Invalid input(s): PROCALCITONIN,  WBC,  LACTICIDVEN    Time coordinating discharge: 35 minutes  SIGNED:  Mercy Riding, MD  [facility]  Hospitalists 05/30/2020, 8:05 AM  If 7PM-7AM, please contact night-coverage [email or web address] Password [email or web address]

## 2020-05-30 NOTE — Progress Notes (Signed)
Patient's blood pressure noted to be 201/77 at 0000. Patient asymptomatic. MD paged, blood pressure cycled every 15 mins. MD gave new orders. Will continue to monitor.

## 2020-05-31 ENCOUNTER — Telehealth: Payer: Self-pay | Admitting: *Deleted

## 2020-05-31 NOTE — Telephone Encounter (Signed)
I have made the 1st attempt to contact the patient or family member in charge, in order to follow up from recently being discharged from the hospital. I left a message on voicemail but I will make another attempt at a different time.  

## 2020-05-31 NOTE — Telephone Encounter (Signed)
Transition Care Management Follow-up Telephone Call  Date of discharge and from where: 05/30/2020 [place]  How have you been since you were released from the hospital? Better, BP still running on the high side. I instructed patient to keep a log of her blood pressure readings and bring to appointment.   Any questions or concerns? No   Items Reviewed:  Did the pt receive and understand the discharge instructions provided? Yes   Medications obtained and verified? Yes   Any new allergies since your discharge? NO  Dietary orders reviewed? Yes  Do you have support at home? Yes   Other (ie: DME, Home Health, etc) No Home Health  Functional Questionnaire: (I = Independent and D = Dependent) ADL's: I  Bathing/Dressing- I   Meal Prep- I  Eating- I  Maintaining continence- I  Transferring/Ambulation- I  Managing Meds- I   Follow up appointments reviewed:    PCP Hospital f/u appt confirmed? Yes  Scheduled to see Janett Billow  06/07/2020 .  [facility] f/u appt confirmed? No    Are transportation arrangements needed? No   If their condition worsens, is the pt aware to call  their PCP or go to the ED? Yes  Was the patient provided with contact information for the PCP's office or ED? Yes  Was the pt encouraged to call back with questions or concerns? Yes

## 2020-06-07 ENCOUNTER — Ambulatory Visit (INDEPENDENT_AMBULATORY_CARE_PROVIDER_SITE_OTHER): Payer: BC Managed Care – PPO | Admitting: Nurse Practitioner

## 2020-06-07 ENCOUNTER — Encounter: Payer: Self-pay | Admitting: Nurse Practitioner

## 2020-06-07 ENCOUNTER — Other Ambulatory Visit: Payer: Self-pay

## 2020-06-07 VITALS — BP 134/92 | HR 61 | Temp 97.1°F | Ht 63.0 in | Wt 165.2 lb

## 2020-06-07 DIAGNOSIS — F332 Major depressive disorder, recurrent severe without psychotic features: Secondary | ICD-10-CM | POA: Diagnosis not present

## 2020-06-07 DIAGNOSIS — Z8673 Personal history of transient ischemic attack (TIA), and cerebral infarction without residual deficits: Secondary | ICD-10-CM | POA: Diagnosis not present

## 2020-06-07 DIAGNOSIS — I10 Essential (primary) hypertension: Secondary | ICD-10-CM | POA: Diagnosis not present

## 2020-06-07 DIAGNOSIS — M79604 Pain in right leg: Secondary | ICD-10-CM

## 2020-06-07 DIAGNOSIS — E785 Hyperlipidemia, unspecified: Secondary | ICD-10-CM

## 2020-06-07 MED ORDER — HYDROCHLOROTHIAZIDE 12.5 MG PO TABS: 12.5000 mg | ORAL_TABLET | Freq: Every day | ORAL | 1 refills | Status: DC

## 2020-06-07 NOTE — Patient Instructions (Addendum)
Bring blood pressure cuff with you to next appt Continue to take blood pressure after you take medications.   Add HCTZ 12.5 mg daily for blood pressure.    DASH Eating Plan DASH stands for "Dietary Approaches to Stop Hypertension." The DASH eating plan is a healthy eating plan that has been shown to reduce high blood pressure (hypertension). It may also reduce your risk for type 2 diabetes, heart disease, and stroke. The DASH eating plan may also help with weight loss. What are tips for following this plan?  General guidelines  Avoid eating more than 2,300 mg (milligrams) of salt (sodium) a day. If you have hypertension, you may need to reduce your sodium intake to 1,500 mg a day.  Limit alcohol intake to no more than 1 drink a day for nonpregnant women and 2 drinks a day for men. One drink equals 12 oz of beer, 5 oz of wine, or 1 oz of hard liquor.  Work with your health care provider to maintain a healthy body weight or to lose weight. Ask what an ideal weight is for you.  Get at least 30 minutes of exercise that causes your heart to beat faster (aerobic exercise) most days of the week. Activities may include walking, swimming, or biking.  Work with your health care provider or diet and nutrition specialist (dietitian) to adjust your eating plan to your individual calorie needs. Reading food labels   Check food labels for the amount of sodium per serving. Choose foods with less than 5 percent of the Daily Value of sodium. Generally, foods with less than 300 mg of sodium per serving fit into this eating plan.  To find whole grains, look for the word "whole" as the first word in the ingredient list. Shopping  Buy products labeled as "low-sodium" or "no salt added."  Buy fresh foods. Avoid canned foods and premade or frozen meals. Cooking  Avoid adding salt when cooking. Use salt-free seasonings or herbs instead of table salt or sea salt. Check with your health care provider or  pharmacist before using salt substitutes.  Do not fry foods. Cook foods using healthy methods such as baking, boiling, grilling, and broiling instead.  Cook with heart-healthy oils, such as olive, canola, soybean, or sunflower oil. Meal planning  Eat a balanced diet that includes: ? 5 or more servings of fruits and vegetables each day. At each meal, try to fill half of your plate with fruits and vegetables. ? Up to 6-8 servings of whole grains each day. ? Less than 6 oz of lean meat, poultry, or fish each day. A 3-oz serving of meat is about the same size as a deck of cards. One egg equals 1 oz. ? 2 servings of low-fat dairy each day. ? A serving of nuts, seeds, or beans 5 times each week. ? Heart-healthy fats. Healthy fats called Omega-3 fatty acids are found in foods such as flaxseeds and coldwater fish, like sardines, salmon, and mackerel.  Limit how much you eat of the following: ? Canned or prepackaged foods. ? Food that is high in trans fat, such as fried foods. ? Food that is high in saturated fat, such as fatty meat. ? Sweets, desserts, sugary drinks, and other foods with added sugar. ? Full-fat dairy products.  Do not salt foods before eating.  Try to eat at least 2 vegetarian meals each week.  Eat more home-cooked food and less restaurant, buffet, and fast food.  When eating at a restaurant, ask  that your food be prepared with less salt or no salt, if possible. What foods are recommended? The items listed may not be a complete list. Talk with your dietitian about what dietary choices are best for you. Grains Whole-grain or whole-wheat bread. Whole-grain or whole-wheat pasta. Brown rice. [place]. Bulgur. Whole-grain and low-sodium cereals. Pita bread. Low-fat, low-sodium crackers. Whole-wheat flour tortillas. Vegetables Fresh or frozen vegetables (raw, steamed, roasted, or grilled). Low-sodium or reduced-sodium tomato and vegetable juice. Low-sodium or  reduced-sodium tomato sauce and tomato paste. Low-sodium or reduced-sodium canned vegetables. Fruits All fresh, dried, or frozen fruit. Canned fruit in natural juice (without added sugar). Meat and other protein foods Skinless chicken or [place]. Ground chicken or [place]. Pork with fat trimmed off. Fish and seafood. Egg whites. Dried beans, peas, or lentils. Unsalted nuts, nut butters, and seeds. Unsalted canned beans. Lean cuts of beef with fat trimmed off. Low-sodium, lean deli meat. Dairy Low-fat (1%) or fat-free (skim) milk. Fat-free, low-fat, or reduced-fat cheeses. Nonfat, low-sodium ricotta or cottage cheese. Low-fat or nonfat yogurt. Low-fat, low-sodium cheese. Fats and oils Soft margarine without trans fats. Vegetable oil. Low-fat, reduced-fat, or light mayonnaise and salad dressings (reduced-sodium). Canola, safflower, olive, soybean, and sunflower oils. Avocado. Seasoning and other foods Herbs. Spices. Seasoning mixes without salt. Unsalted popcorn and pretzels. Fat-free sweets. What foods are not recommended? The items listed may not be a complete list. Talk with your dietitian about what dietary choices are best for you. Grains Baked goods made with fat, such as croissants, muffins, or some breads. Dry pasta or rice meal packs. Vegetables Creamed or fried vegetables. Vegetables in a cheese sauce. Regular canned vegetables (not low-sodium or reduced-sodium). Regular canned tomato sauce and paste (not low-sodium or reduced-sodium). Regular tomato and vegetable juice (not low-sodium or reduced-sodium). Angie Fava. Olives. Fruits Canned fruit in a light or heavy syrup. Fried fruit. Fruit in cream or butter sauce. Meat and other protein foods Fatty cuts of meat. Ribs. Fried meat. Berniece Salines. Sausage. Bologna and other processed lunch meats. Salami. Fatback. Hotdogs. Bratwurst. Salted nuts and seeds. Canned beans with added salt. Canned or smoked fish. Whole eggs or egg yolks. Chicken or [place]  with skin. Dairy Whole or 2% milk, cream, and half-and-half. Whole or full-fat cream cheese. Whole-fat or sweetened yogurt. Full-fat cheese. Nondairy creamers. Whipped toppings. Processed cheese and cheese spreads. Fats and oils Butter. Stick margarine. Lard. Shortening. Ghee. Bacon fat. Tropical oils, such as coconut, palm kernel, or palm oil. Seasoning and other foods Salted popcorn and pretzels. Onion salt, garlic salt, seasoned salt, table salt, and sea salt. Worcestershire sauce. Tartar sauce. Barbecue sauce. Teriyaki sauce. Soy sauce, including reduced-sodium. Steak sauce. Canned and packaged gravies. Fish sauce. Oyster sauce. Cocktail sauce. Horseradish that you find on the shelf. Ketchup. Mustard. Meat flavorings and tenderizers. Bouillon cubes. Hot sauce and Tabasco sauce. Premade or packaged marinades. Premade or packaged taco seasonings. Relishes. Regular salad dressings. Where to find more information:  National Heart, Lung, and [place]: https://wilson-eaton.com/  American Heart Association: www.heart.org Summary  The DASH eating plan is a healthy eating plan that has been shown to reduce high blood pressure (hypertension). It may also reduce your risk for type 2 diabetes, heart disease, and stroke.  With the DASH eating plan, you should limit salt (sodium) intake to 2,300 mg a day. If you have hypertension, you may need to reduce your sodium intake to 1,500 mg a day.  When on the DASH eating plan, aim to eat more fresh fruits  and vegetables, whole grains, lean proteins, low-fat dairy, and heart-healthy fats.  Work with your health care provider or diet and nutrition specialist (dietitian) to adjust your eating plan to your individual calorie needs. This information is not intended to replace advice given to you by your health care provider. Make sure you discuss any questions you have with your health care provider. Document Revised: 11/05/2017 Document Reviewed:  11/16/2016 Elsevier Patient Education  2020 [employer].

## 2020-06-07 NOTE — Progress Notes (Signed)
Careteam: Patient Care Team: Lauree Chandler, NP as PCP - General (Geriatric Medicine) [name], MD as Consulting Physician (Otolaryngology)  PLACE OF SERVICE:  [place] Directive information    No Known Allergies  Chief Complaint  Patient presents with  . [facility] follow-up 6/22-6/24/2021 for hypertensive emergency. Patient with B/P log   . Stress    Patient with increased stress, head feels like its swimming, and patient with alot on her mind   . Leg Pain    Right leg pain at night, pain almost produces tears      HPI: Patient is a 79 y.o. female for hospital follow up.  Reports June 12th she recorded her first episode.  She could not walk straight and had to call her husband and grandson to get her bed. Never thought to take blood pressure or call anyone about it then had another episode a week later. She wanted to be seen in the office but was referred to the hospital. She was admitted and MRI brain with left basal ganglia/putamen infarct and right frontal lobe cavernous angioma.Patient was started on nitro drip and admitted to [facility] for hypertensive emergency and acute CVA.  She has not had any deficits related to this.  She was placed on plavix.   Letter was sent from insurance due to noncompliance of losartan- she admits she would go weeks without taking medication. Used to add salt in a lot of food.  She has changed her diet. Cutting back on salt.  Ate a bunch of crackers last night with salt.    Reports she has had episodes when she is laying in bed that sharp pain shoots down right leg when she tries to turn to the right or left. Reports the shooting pain but goes away. happens when she tries to turn, twice a night.  No low back pain.  No weakness in legs.  Never happens during the day Has been going on for about 2 months.  Has not gotten better or worse.  Noticing mostly in calf.  no swelling or redness  or heat  LDL not at goal. Was started on lipitor 80 mg during hospitalization. No side effects noted. Has made dietary modifications.    Review of Systems:  Review of Systems  Constitutional: Negative for chills, fever and weight loss.  HENT: Negative for tinnitus.   Respiratory: Negative for cough, sputum production and shortness of breath.   Cardiovascular: Negative for chest pain, palpitations and leg swelling.  Gastrointestinal: Negative for abdominal pain, constipation, diarrhea and heartburn.  Genitourinary: Negative for dysuria, frequency and urgency.  Musculoskeletal: Positive for myalgias. Negative for back pain, falls and joint pain.  Skin: Negative.   Neurological: Negative for dizziness and headaches.  Psychiatric/Behavioral: Negative for depression and memory loss. The patient does not have insomnia.     Past Medical History:  Diagnosis Date  . Breast CA ([place]) 2006   right [facility])  . Cancer ([place])   . Helicobacter pylori gastritis 2001  . High blood pressure   . History of adenomatous polyp of colon 09/23/2017   [place] 09/25/2017 and apparently polyps in the past as well though recalled age  . History of shingles   . Major depressive disorder 05/28/2020  . Major depressive disorder, recurrent, severe without psychotic features ([place]) 06/24/2015  . Parotid mass 2014   right benign cyst  . Personal history of radiation therapy 2006  Rt breast  . Wears glasses    Past Surgical History:  Procedure Laterality Date  . ABDOMINAL HYSTERECTOMY  1982   BSO  . BREAST LUMPECTOMY Right 2007  . BREAST LUMPECTOMY WITH SENTINEL LYMPH NODE BIOPSY  2005   right  . BREAST SURGERY  2004   rt br mass  . CESAREAN SECTION    . COLONOSCOPY    . ESOPHAGOGASTRODUODENOSCOPY  2001   H pylori gastritis  . EYE SURGERY     both cataracts  . PAROTIDECTOMY Right 09/05/2013   Procedure: RIGHT SUPERFICIAL PAROTIDECTOMY WITH FACIAL NERVE DISECTION;  Surgeon: [name], MD;   Location: [facility];  Service: ENT;  Laterality: Right;  All documentation done by R.Ward after [phone] --done under G. Garzon's password  . RE-EXCISION OF BREAST LUMPECTOMY  2005   right   Social History:   reports that she has never smoked. She quit smokeless tobacco use about 2 years ago.  Her smokeless tobacco use included chew. She reports current alcohol use. She reports that she does not use drugs.  Family History  Problem Relation Age of Onset  . Heart disease Mother        MI  . Cancer Father        bone  . Thyroid disease Neg Hx   . Hypercalcemia Neg Hx   . Colon cancer Neg Hx   . Stomach cancer Neg Hx   . Rectal cancer Neg Hx   . Esophageal cancer Neg Hx     Medications: Patient's Medications  New Prescriptions   No medications on file  Previous Medications   ASPIRIN EC 81 MG EC TABLET    Take 1 tablet (81 mg total) by mouth daily. Swallow whole.   ATORVASTATIN (LIPITOR) 80 MG TABLET    Take 1 tablet (80 mg total) by mouth daily.   CALCIUM CARBONATE-VITAMIN D (CALTRATE 600+D PO)    Take 1 tablet by mouth in the morning and at bedtime. Take one by mouth twice daily.    CARVEDILOL (COREG) 6.25 MG TABLET    Take 1 tablet (6.25 mg total) by mouth 2 (two) times daily with a meal.   CLOPIDOGREL (PLAVIX) 75 MG TABLET    Take 1 tablet (75 mg total) by mouth daily.   LOSARTAN (COZAAR) 100 MG TABLET    Take 1 tablet (100 mg total) by mouth daily.   MULTIPLE VITAMINS-MINERALS (CENTRUM MULTIGUMMIES) CHEW    Chew 2 tablets by mouth daily.   SERTRALINE (ZOLOFT) 50 MG TABLET    Take 1.5 tablets (75 mg total) by mouth daily.   TRAZODONE (DESYREL) 50 MG TABLET    Take 1 tablet (50 mg total) by mouth at bedtime as needed for sleep.  Modified Medications   No medications on file  Discontinued Medications   No medications on file    Physical Exam:  Vitals:   06/07/20 1327  BP: (!) 134/92  Pulse: 61  Temp: (!) 97.1 F (36.2 C)  TempSrc: Temporal  SpO2: 98%    Weight: 165 lb 3.2 oz (74.9 kg)  Height: 5\' 3"  (1.6 m)   Body mass index is 29.26 kg/m. Wt Readings from Last 3 Encounters:  06/07/20 165 lb 3.2 oz (74.9 kg)  05/29/20 165 lb (74.8 kg)  01/15/20 165 lb 9.6 oz (75.1 kg)    Physical Exam Constitutional:      General: She is not in acute distress.    Appearance: She is well-developed. She is not  diaphoretic.  HENT:     Head: Normocephalic and atraumatic.     Mouth/Throat:     Pharynx: No oropharyngeal exudate.  Eyes:     Conjunctiva/sclera: Conjunctivae normal.     Pupils: Pupils are equal, round, and reactive to light.  Cardiovascular:     Rate and Rhythm: Normal rate and regular rhythm.     Heart sounds: Normal heart sounds.  Pulmonary:     Effort: Pulmonary effort is normal.     Breath sounds: Normal breath sounds.  Abdominal:     General: Bowel sounds are normal.     Palpations: Abdomen is soft.  Musculoskeletal:        General: No swelling or tenderness.     Cervical back: Normal range of motion and neck supple.     Right lower leg: No edema.     Left lower leg: No edema.  Skin:    General: Skin is warm and dry.  Neurological:     Mental Status: She is alert and oriented to person, place, and time.  Psychiatric:        Mood and Affect: Mood normal.        Behavior: Behavior normal.     Labs reviewed: Basic Metabolic Panel: Recent Labs    01/15/20 1034 05/28/20 1201  NA 141 139  K 4.3 4.0  CL 109 105  CO2 25 26  GLUCOSE 86 102*  BUN 18 15  CREATININE 0.86 0.86  CALCIUM 10.5* 10.2   Liver Function Tests: Recent Labs    01/15/20 1034  AST 13  ALT 7  BILITOT 0.5  PROT 6.5   No results for input(s): LIPASE, AMYLASE in the last 8760 hours. No results for input(s): AMMONIA in the last 8760 hours. CBC: Recent Labs    01/15/20 1034 05/28/20 1201  WBC 5.7 5.9  NEUTROABS 2,879 3.0  HGB 13.0 14.4  HCT 39.0 45.3  MCV 86.3 90.1  PLT 311 322   Lipid Panel: Recent Labs    01/15/20 1034  05/29/20 0543  CHOL 204* 211*  HDL 46* 44  LDLCALC 138* 145*  TRIG 97 111  CHOLHDL 4.4 4.8   TSH: No results for input(s): TSH in the last 8760 hours. A1C: Lab Results  Component Value Date   HGBA1C 5.6 05/29/2020     Assessment/Plan 1. Essential hypertension, benign Not at goal.will continue losartan with coreg 6.25 mg BID. Stressed importance of diet which she is working on. DASH diet given -will add HCTZ as well at this time. And she will take blood pressure at home and record. - hydrochlorothiazide (HYDRODIURIL) 12.5 MG tablet; Take 1 tablet (12.5 mg total) by mouth daily.  Dispense: 30 tablet; Refill: 1  2. Stroke, recent, without late effect -now has been started on plavix 75 mg along with ASA 81 mg daily. Discussed importance of lifestyle modifications for prevention of another stroke. Pt understands and willing to be more compliant with medication and diet.   3. Right leg pain Only happens at night when turning positions, she will start being more aware of pain and report back at next visit  4. Major depressive disorder, recurrent severe without psychotic features ([place]) Stable on trazodone and zoloft  5. Hyperlipidemia LDL goal <70 Has started lipitor 80 mg daily, tolerating well at this time. Encouraged diet modifications as well.  Next appt: 2 weeks for blood pressure Will need to schedule fasting blood work and follow up in 3 months due to starting lipitor.  Carlos [employer]. [place], [facility] [phone]

## 2020-06-11 NOTE — Addendum Note (Signed)
Addended by: Sherrie Mustache K on: 06/11/2020 12:12 PM   Modules accepted: Level of Service

## 2020-06-12 ENCOUNTER — Telehealth: Payer: Self-pay

## 2020-06-12 NOTE — Telephone Encounter (Signed)
Patient was transferred to me to address the medications. Patient was told that she is suppose to take both medications. Patient thought she was to stop taking Coreg didn't understand she was suppose to take both Coreg and HCTZ.

## 2020-06-12 NOTE — Telephone Encounter (Signed)
Patient called the office and said she missed the return call from our office I read the note that had been put in her chart by Chrisandra Carota CMA and explained to her what she had said she became very rude to me and told me she didn't want to get her blood pressure up and she would like to speak to someone else that was not me I went to Chrisandra Carota and asked her to take the call and explained that the patient was upset

## 2020-06-12 NOTE — Telephone Encounter (Signed)
Patient called stating that she felt dizzy this morning, but is ok. She was strated on new medication, but didn't know which one she is suppose to stop taking. According to last visit note on 06/07/2020  she is to take both the Coreg and the HCTZ. Tried calling patient, but no answer. Left message for patient to call office back regarding this matter.

## 2020-06-24 NOTE — Telephone Encounter (Signed)
This encounter was created in error - please disregard.

## 2020-06-26 ENCOUNTER — Ambulatory Visit: Payer: Medicare Other | Admitting: Nurse Practitioner

## 2020-06-28 ENCOUNTER — Ambulatory Visit (INDEPENDENT_AMBULATORY_CARE_PROVIDER_SITE_OTHER): Payer: BC Managed Care – PPO | Admitting: Nurse Practitioner

## 2020-06-28 ENCOUNTER — Other Ambulatory Visit: Payer: Self-pay

## 2020-06-28 ENCOUNTER — Encounter: Payer: Self-pay | Admitting: Nurse Practitioner

## 2020-06-28 VITALS — BP 118/68 | HR 71 | Temp 97.4°F | Ht 63.0 in | Wt 165.4 lb

## 2020-06-28 DIAGNOSIS — I10 Essential (primary) hypertension: Secondary | ICD-10-CM | POA: Diagnosis not present

## 2020-06-28 DIAGNOSIS — I639 Cerebral infarction, unspecified: Secondary | ICD-10-CM

## 2020-06-28 DIAGNOSIS — Z8673 Personal history of transient ischemic attack (TIA), and cerebral infarction without residual deficits: Secondary | ICD-10-CM

## 2020-06-28 DIAGNOSIS — E785 Hyperlipidemia, unspecified: Secondary | ICD-10-CM | POA: Diagnosis not present

## 2020-06-28 HISTORY — DX: Cerebral infarction, unspecified: I63.9

## 2020-06-28 LAB — BASIC METABOLIC PANEL WITH GFR
BUN/Creatinine Ratio: 19 (calc) (ref 6–22)
BUN: 20 mg/dL (ref 7–25)
CO2: 26 mmol/L (ref 20–32)
Calcium: 10.8 mg/dL — ABNORMAL HIGH (ref 8.6–10.4)
Chloride: 108 mmol/L (ref 98–110)
Creat: 1.05 mg/dL — ABNORMAL HIGH (ref 0.60–0.93)
GFR, Est African American: 59 mL/min/{1.73_m2} — ABNORMAL LOW (ref >=60)
GFR, Est Non African American: 51 mL/min/{1.73_m2} — ABNORMAL LOW (ref >=60)
Glucose, Bld: 116 mg/dL — ABNORMAL HIGH (ref 65–99)
Potassium: 4 mmol/L (ref 3.5–5.3)
Sodium: 139 mmol/L (ref 135–146)

## 2020-06-28 MED ORDER — ATORVASTATIN CALCIUM 80 MG PO TABS: 80.0000 mg | ORAL_TABLET | Freq: Every day | ORAL | 1 refills | Status: DC

## 2020-06-28 MED ORDER — HYDROCHLOROTHIAZIDE 12.5 MG PO TABS: 12.5000 mg | ORAL_TABLET | Freq: Every day | ORAL | 1 refills | Status: DC

## 2020-06-28 NOTE — Progress Notes (Signed)
Careteam: Patient Care Team: Lauree Chandler, NP as PCP - General (Geriatric Medicine) [name], MD as Consulting Physician (Otolaryngology)  PLACE OF SERVICE:  [facility] Directive information    No Known Allergies  Chief Complaint  Patient presents with  . Follow-up    Blood pressure follow-up      HPI: Patient is a 79 y.o. female for blood pressure check Has cut out soda and decrease salt intake.  Review of Systems:  Review of Systems  Constitutional: Negative for chills, fever and weight loss.  HENT: Negative for tinnitus.   Respiratory: Negative for cough and shortness of breath.   Cardiovascular: Negative for chest pain, palpitations and leg swelling.  Gastrointestinal: Negative for abdominal pain, constipation, diarrhea and heartburn.  Musculoskeletal: Negative for back pain, joint pain and myalgias.  Skin: Negative.   Neurological: Negative for dizziness and headaches.    Past Medical History:  Diagnosis Date  . Breast CA ([place]) 2006   right [facility])  . Cancer ([place])   . Helicobacter pylori gastritis 2001  . High blood pressure   . History of adenomatous polyp of colon 09/23/2017   [place] 09/25/2017 and apparently polyps in the past as well though recalled age  . History of shingles   . Major depressive disorder 05/28/2020  . Major depressive disorder, recurrent, severe without psychotic features ([place]) 06/24/2015  . Parotid mass 2014   right benign cyst  . Personal history of radiation therapy 2006   Rt breast  . Wears glasses    Past Surgical History:  Procedure Laterality Date  . ABDOMINAL HYSTERECTOMY  1982   BSO  . BREAST LUMPECTOMY Right 2007  . BREAST LUMPECTOMY WITH SENTINEL LYMPH NODE BIOPSY  2005   right  . BREAST SURGERY  2004   rt br mass  . CESAREAN SECTION    . COLONOSCOPY    . ESOPHAGOGASTRODUODENOSCOPY  2001   H pylori gastritis  . EYE SURGERY     both cataracts  . PAROTIDECTOMY Right 09/05/2013    Procedure: RIGHT SUPERFICIAL PAROTIDECTOMY WITH FACIAL NERVE DISECTION;  Surgeon: [name], MD;  Location: [facility];  Service: ENT;  Laterality: Right;  All documentation done by R.Ward after [phone] --done under G. Garzon's password  . RE-EXCISION OF BREAST LUMPECTOMY  2005   right   Social History:   reports that she has never smoked. She quit smokeless tobacco use about 2 years ago.  Her smokeless tobacco use included chew. She reports current alcohol use. She reports that she does not use drugs.  Family History  Problem Relation Age of Onset  . Heart disease Mother        MI  . Cancer Father        bone  . Thyroid disease Neg Hx   . Hypercalcemia Neg Hx   . Colon cancer Neg Hx   . Stomach cancer Neg Hx   . Rectal cancer Neg Hx   . Esophageal cancer Neg Hx     Medications: Patient's Medications  New Prescriptions   No medications on file  Previous Medications   ASPIRIN EC 81 MG EC TABLET    Take 1 tablet (81 mg total) by mouth daily. Swallow whole.   ATORVASTATIN (LIPITOR) 80 MG TABLET    Take 1 tablet (80 mg total) by mouth daily.   CALCIUM CARBONATE-VITAMIN D (CALTRATE 600+D PO)    Take 1 tablet by mouth in the morning and at  bedtime. Take one by mouth twice daily.    CARVEDILOL (COREG) 6.25 MG TABLET    Take 1 tablet (6.25 mg total) by mouth 2 (two) times daily with a meal.   CLOPIDOGREL (PLAVIX) 75 MG TABLET    Take 1 tablet (75 mg total) by mouth daily.   HYDROCHLOROTHIAZIDE (HYDRODIURIL) 12.5 MG TABLET    Take 1 tablet (12.5 mg total) by mouth daily.   LOSARTAN (COZAAR) 100 MG TABLET    Take 1 tablet (100 mg total) by mouth daily.   MULTIPLE VITAMINS-MINERALS (CENTRUM MULTIGUMMIES) CHEW    Chew 2 tablets by mouth daily.   SERTRALINE (ZOLOFT) 50 MG TABLET    Take 1.5 tablets (75 mg total) by mouth daily.   TRAZODONE (DESYREL) 50 MG TABLET    Take 1 tablet (50 mg total) by mouth at bedtime as needed for sleep.  Modified Medications   No  medications on file  Discontinued Medications   No medications on file    Physical Exam:  Vitals:   06/28/20 1311  BP: 118/68  Pulse: 71  Temp: (!) 97.4 F (36.3 C)  TempSrc: Temporal  SpO2: 97%  Weight: 165 lb 6.4 oz (75 kg)  Height: _0  (1.6 m)   Body mass index is 29.3 kg/m. Wt Readings from Last 3 Encounters:  06/28/20 165 lb 6.4 oz (75 kg)  06/07/20 165 lb 3.2 oz (74.9 kg)  05/29/20 165 lb (74.8 kg)    Physical Exam Constitutional:      General: She is not in acute distress.    Appearance: She is well-developed. She is not diaphoretic.  HENT:     Head: Normocephalic and atraumatic.     Mouth/Throat:     Pharynx: No oropharyngeal exudate.  Eyes:     Conjunctiva/sclera: Conjunctivae normal.     Pupils: Pupils are equal, round, and reactive to light.  Cardiovascular:     Rate and Rhythm: Normal rate and regular rhythm.     Heart sounds: Normal heart sounds.  Pulmonary:     Effort: Pulmonary effort is normal.     Breath sounds: Normal breath sounds.  Abdominal:     General: Bowel sounds are normal.     Palpations: Abdomen is soft.  Musculoskeletal:        General: No tenderness.     Cervical back: Normal range of motion and neck supple.  Skin:    General: Skin is warm and dry.  Neurological:     Mental Status: She is alert and oriented to person, place, and time.  Psychiatric:        Mood and Affect: Mood normal.        Behavior: Behavior normal.     Labs reviewed: Basic Metabolic Panel: Recent Labs    01/15/20 1034 05/28/20 1201  NA 141 139  K 4.3 4.0  CL 109 105  CO2 25 26  GLUCOSE 86 102*  BUN 18 15  CREATININE 0.86 0.86  CALCIUM 10.5* 10.2   Liver Function Tests: Recent Labs    01/15/20 1034  AST 13  ALT 7  BILITOT 0.5  PROT 6.5   No results for input(s): LIPASE, AMYLASE in the last 8760 hours. No results for input(s): AMMONIA in the last 8760 hours. CBC: Recent Labs    01/15/20 1034 05/28/20 1201  WBC 5.7 5.9    NEUTROABS 2,879 3.0  HGB 13.0 14.4  HCT 39.0 45.3  MCV 86.3 90.1  PLT 311 322   Lipid Panel: Recent  Labs    01/15/20 1034 05/29/20 0543  CHOL 204* 211*  HDL 46* 44  LDLCALC 138* 145*  TRIG 97 111  CHOLHDL 4.4 4.8   TSH: No results for input(s): TSH in the last 8760 hours. A1C: Lab Results  Component Value Date   HGBA1C 5.6 05/29/2020     Assessment/Plan 1. Essential hypertension, benign -improved today. Educated on how to take blood pressure at home. Continue low sodium diet with losartan, hctz and coreg. - COMPLETE METABOLIC PANEL WITH GFR; Future - CMP with eGFR([facility]); Future - CBC with Differential/Platelet; Future - hydrochlorothiazide (HYDRODIURIL) 12.5 MG tablet; Take 1 tablet (12.5 mg total) by mouth daily.  Dispense: 90 tablet; Refill: 1  2. Hyperlipidemia LDL goal <70 -to continue on lipitor. Will follow up cholesterol prior to next visit. Continue to work on dietary modifications as well  - COMPLETE METABOLIC PANEL WITH GFR; Future - CMP with eGFR([facility]); Future - Lipid Panel; Future - atorvastatin (LIPITOR) 80 MG tablet; Take 1 tablet (80 mg total) by mouth daily.  Dispense: 90 tablet; Refill: 1  3. Stroke, recent, without late effect -to continue asa and plavix, continue to work on blood pressure control. Continue dietary modifications.  - Ambulatory referral to Neurology - CMP with eGFR([facility]); Future - CBC with Differential/Platelet; Future - Lipid Panel; Future  Next appt: 3 months with labs prior Erin Cunningham K. [place], [facility] [phone]

## 2020-06-28 NOTE — Patient Instructions (Addendum)
Continue all medication as prescribed.    Due to your recent stroke we have placed a neurology referral. Continue Plavix and ASA at this time  Continue low sodium diet.    DASH Eating Plan DASH stands for "Dietary Approaches to Stop Hypertension." The DASH eating plan is a healthy eating plan that has been shown to reduce high blood pressure (hypertension). It may also reduce your risk for type 2 diabetes, heart disease, and stroke. The DASH eating plan may also help with weight loss. What are tips for following this plan?  General guidelines  Avoid eating more than 2,300 mg (milligrams) of salt (sodium) a day. If you have hypertension, you may need to reduce your sodium intake to 1,500 mg a day.  Limit alcohol intake to no more than 1 drink a day for nonpregnant women and 2 drinks a day for men. One drink equals 12 oz of beer, 5 oz of wine, or 1 oz of hard liquor.  Work with your health care provider to maintain a healthy body weight or to lose weight. Ask what an ideal weight is for you.  Get at least 30 minutes of exercise that causes your heart to beat faster (aerobic exercise) most days of the week. Activities may include walking, swimming, or biking.  Work with your health care provider or diet and nutrition specialist (dietitian) to adjust your eating plan to your individual calorie needs. Reading food labels   Check food labels for the amount of sodium per serving. Choose foods with less than 5 percent of the Daily Value of sodium. Generally, foods with less than 300 mg of sodium per serving fit into this eating plan.  To find whole grains, look for the word "whole" as the first word in the ingredient list. Shopping  Buy products labeled as "low-sodium" or "no salt added."  Buy fresh foods. Avoid canned foods and premade or frozen meals. Cooking  Avoid adding salt when cooking. Use salt-free seasonings or herbs instead of table salt or sea salt. Check with your health care  provider or pharmacist before using salt substitutes.  Do not fry foods. Cook foods using healthy methods such as baking, boiling, grilling, and broiling instead.  Cook with heart-healthy oils, such as olive, canola, soybean, or sunflower oil. Meal planning  Eat a balanced diet that includes: ? 5 or more servings of fruits and vegetables each day. At each meal, try to fill half of your plate with fruits and vegetables. ? Up to 6-8 servings of whole grains each day. ? Less than 6 oz of lean meat, poultry, or fish each day. A 3-oz serving of meat is about the same size as a deck of cards. One egg equals 1 oz. ? 2 servings of low-fat dairy each day. ? A serving of nuts, seeds, or beans 5 times each week. ? Heart-healthy fats. Healthy fats called Omega-3 fatty acids are found in foods such as flaxseeds and coldwater fish, like sardines, salmon, and mackerel.  Limit how much you eat of the following: ? Canned or prepackaged foods. ? Food that is high in trans fat, such as fried foods. ? Food that is high in saturated fat, such as fatty meat. ? Sweets, desserts, sugary drinks, and other foods with added sugar. ? Full-fat dairy products.  Do not salt foods before eating.  Try to eat at least 2 vegetarian meals each week.  Eat more home-cooked food and less restaurant, buffet, and fast food.  When eating at  a restaurant, ask that your food be prepared with less salt or no salt, if possible. What foods are recommended? The items listed may not be a complete list. Talk with your dietitian about what dietary choices are best for you. Grains Whole-grain or whole-wheat bread. Whole-grain or whole-wheat pasta. Brown rice. [place]. Bulgur. Whole-grain and low-sodium cereals. Pita bread. Low-fat, low-sodium crackers. Whole-wheat flour tortillas. Vegetables Fresh or frozen vegetables (raw, steamed, roasted, or grilled). Low-sodium or reduced-sodium tomato and vegetable juice. Low-sodium or  reduced-sodium tomato sauce and tomato paste. Low-sodium or reduced-sodium canned vegetables. Fruits All fresh, dried, or frozen fruit. Canned fruit in natural juice (without added sugar). Meat and other protein foods Skinless chicken or [place]. Ground chicken or [place]. Pork with fat trimmed off. Fish and seafood. Egg whites. Dried beans, peas, or lentils. Unsalted nuts, nut butters, and seeds. Unsalted canned beans. Lean cuts of beef with fat trimmed off. Low-sodium, lean deli meat. Dairy Low-fat (1%) or fat-free (skim) milk. Fat-free, low-fat, or reduced-fat cheeses. Nonfat, low-sodium ricotta or cottage cheese. Low-fat or nonfat yogurt. Low-fat, low-sodium cheese. Fats and oils Soft margarine without trans fats. Vegetable oil. Low-fat, reduced-fat, or light mayonnaise and salad dressings (reduced-sodium). Canola, safflower, olive, soybean, and sunflower oils. Avocado. Seasoning and other foods Herbs. Spices. Seasoning mixes without salt. Unsalted popcorn and pretzels. Fat-free sweets. What foods are not recommended? The items listed may not be a complete list. Talk with your dietitian about what dietary choices are best for you. Grains Baked goods made with fat, such as croissants, muffins, or some breads. Dry pasta or rice meal packs. Vegetables Creamed or fried vegetables. Vegetables in a cheese sauce. Regular canned vegetables (not low-sodium or reduced-sodium). Regular canned tomato sauce and paste (not low-sodium or reduced-sodium). Regular tomato and vegetable juice (not low-sodium or reduced-sodium). Angie Fava. Olives. Fruits Canned fruit in a light or heavy syrup. Fried fruit. Fruit in cream or butter sauce. Meat and other protein foods Fatty cuts of meat. Ribs. Fried meat. Berniece Salines. Sausage. Bologna and other processed lunch meats. Salami. Fatback. Hotdogs. Bratwurst. Salted nuts and seeds. Canned beans with added salt. Canned or smoked fish. Whole eggs or egg yolks. Chicken or [place]  with skin. Dairy Whole or 2% milk, cream, and half-and-half. Whole or full-fat cream cheese. Whole-fat or sweetened yogurt. Full-fat cheese. Nondairy creamers. Whipped toppings. Processed cheese and cheese spreads. Fats and oils Butter. Stick margarine. Lard. Shortening. Ghee. Bacon fat. Tropical oils, such as coconut, palm kernel, or palm oil. Seasoning and other foods Salted popcorn and pretzels. Onion salt, garlic salt, seasoned salt, table salt, and sea salt. Worcestershire sauce. Tartar sauce. Barbecue sauce. Teriyaki sauce. Soy sauce, including reduced-sodium. Steak sauce. Canned and packaged gravies. Fish sauce. Oyster sauce. Cocktail sauce. Horseradish that you find on the shelf. Ketchup. Mustard. Meat flavorings and tenderizers. Bouillon cubes. Hot sauce and Tabasco sauce. Premade or packaged marinades. Premade or packaged taco seasonings. Relishes. Regular salad dressings. Where to find more information:  National Heart, Lung, and [place]: https://wilson-eaton.com/  American Heart Association: www.heart.org Summary  The DASH eating plan is a healthy eating plan that has been shown to reduce high blood pressure (hypertension). It may also reduce your risk for type 2 diabetes, heart disease, and stroke.  With the DASH eating plan, you should limit salt (sodium) intake to 2,300 mg a day. If you have hypertension, you may need to reduce your sodium intake to 1,500 mg a day.  When on the DASH eating plan, aim to eat  more fresh fruits and vegetables, whole grains, lean proteins, low-fat dairy, and heart-healthy fats.  Work with your health care provider or diet and nutrition specialist (dietitian) to adjust your eating plan to your individual calorie needs. This information is not intended to replace advice given to you by your health care provider. Make sure you discuss any questions you have with your health care provider. Document Revised: 11/05/2017 Document Reviewed:  11/16/2016 Elsevier Patient Education  2020 [employer].

## 2020-07-03 ENCOUNTER — Other Ambulatory Visit: Payer: Self-pay

## 2020-07-03 DIAGNOSIS — N289 Disorder of kidney and ureter, unspecified: Secondary | ICD-10-CM

## 2020-07-08 ENCOUNTER — Other Ambulatory Visit: Payer: Self-pay

## 2020-07-08 ENCOUNTER — Telehealth: Payer: Self-pay

## 2020-07-08 NOTE — Telephone Encounter (Signed)
Patient called and asked if she could take two 650 Tylenol. She stated that one tablet did ease her pain some. Janett Billow said not to take 2 at a time and she was notified of this. An appointment was made for 8/4 to be seen in the office to evaluate her pain.

## 2020-07-08 NOTE — Progress Notes (Deleted)
This encounter was created in error - please disregard.

## 2020-07-08 NOTE — Telephone Encounter (Signed)
Patient is complaining of right hip pain. She states she is unable to lift her leg and that pain is an 8/10. She continues to work. She states you told her to let you know if she had issues status post stroke.

## 2020-07-08 NOTE — Telephone Encounter (Signed)
Noted please let her know we can see her in office to evaluate pain if needed

## 2020-07-08 NOTE — Progress Notes (Deleted)
This encounter was created in error - please disregard.  This encounter was created in error - please disregard.

## 2020-07-09 NOTE — Progress Notes (Deleted)
Opened in error  This encounter was created in error - please disregard. 

## 2020-07-09 NOTE — Progress Notes (Deleted)
This encounter was created in error

## 2020-07-10 ENCOUNTER — Other Ambulatory Visit: Payer: Self-pay

## 2020-07-10 ENCOUNTER — Ambulatory Visit (INDEPENDENT_AMBULATORY_CARE_PROVIDER_SITE_OTHER): Payer: BC Managed Care – PPO | Admitting: Nurse Practitioner

## 2020-07-10 ENCOUNTER — Encounter: Payer: Self-pay | Admitting: Nurse Practitioner

## 2020-07-10 VITALS — BP 152/80 | HR 51 | Temp 96.9°F | Ht 63.0 in | Wt 163.8 lb

## 2020-07-10 DIAGNOSIS — E785 Hyperlipidemia, unspecified: Secondary | ICD-10-CM

## 2020-07-10 DIAGNOSIS — I10 Essential (primary) hypertension: Secondary | ICD-10-CM | POA: Diagnosis not present

## 2020-07-10 DIAGNOSIS — N289 Disorder of kidney and ureter, unspecified: Secondary | ICD-10-CM | POA: Diagnosis not present

## 2020-07-10 DIAGNOSIS — Z8673 Personal history of transient ischemic attack (TIA), and cerebral infarction without residual deficits: Secondary | ICD-10-CM

## 2020-07-10 DIAGNOSIS — M7061 Trochanteric bursitis, right hip: Secondary | ICD-10-CM | POA: Diagnosis not present

## 2020-07-10 MED ORDER — PREDNISONE 10 MG (21) PO TBPK: ORAL_TABLET | ORAL | 0 refills | Status: DC

## 2020-07-10 NOTE — Patient Instructions (Addendum)
To take prednisone as directed Can use tylenol 650 mg every 6 hours as needed for pain as well.  To use ice 2-3 times daily on hip for pain To seek medical attention if pain worsens, swelling, redness or fever occurs   Hip Bursitis  Hip bursitis is inflammation of a fluid-filled sac (bursa) in the hip joint. The bursa prevents the bones in the hip joint from rubbing against each other. Hip bursitis can cause mild to moderate pain, and symptoms often come and go over time. What are the causes? This condition may be caused by:  Injury to the hip.  Overuse of the muscles that surround the hip joint.  Previous injury or surgery of the hip.  Arthritis or gout.  Diabetes.  Thyroid disease.  Infection. In some cases, the cause may not be known. What are the signs or symptoms? Symptoms of this condition include:  Mild or moderate pain in the hip area. Pain may get worse with movement.  Tenderness and swelling of the hip, especially on the outer side of the hip.  In rare cases, the bursa may become infected. This may cause a fever, as well as warmth and redness in the area. Symptoms may come and go. How is this diagnosed? This condition may be diagnosed based on:  A physical exam.  Your medical history.  X-rays.  Removal of fluid from your inflamed bursa for testing (biopsy). You may be sent to a health care provider who specializes in bone diseases (orthopedist) or a provider who specializes in joint inflammation (rheumatologist). How is this treated? This condition is treated by resting, icing, applying pressure (compression), and raising (elevating) the injured area. This is called RICE treatment. In some cases, this may be enough to make your symptoms go away. Treatment may also include:  Using crutches.  Draining fluid out of the bursa to help relieve swelling.  Injecting medicine that helps to reduce inflammation (cortisone).  Additional medicines if the bursa is  infected. Follow these instructions at home: Managing pain, stiffness, and swelling   If directed, put ice on the painful area. ? Put ice in a plastic bag. ? Place a towel between your skin and the bag. ? Leave the ice on for 20 minutes, 2-3 times a day. ? Raise (elevate) your hip above the level of your heart as much as you can without pain. To do this, try putting a pillow under your hips while you lie down. Activity  Return to your normal activities as told by your health care provider. Ask your health care provider what activities are safe for you.  Rest and protect your hip as much as possible until your pain and swelling get better. General instructions  Take over-the-counter and prescription medicines only as told by your health care provider.  Wear compression wraps only as told by your health care provider.  Do not use your hip to support your body weight until your health care provider says that you can. Use crutches as told by your health care provider.  Gently massage and stretch your injured area as often as is comfortable.  Keep all follow-up visits as told by your health care provider. This is important. How is this prevented?  Exercise regularly, as told by your health care provider.  Warm up and stretch before being active.  Cool down and stretch after being active.  If an activity irritates your hip or causes pain, avoid the activity as much as possible.  Avoid sitting  down for long periods at a time. Contact a health care provider if you:  Have a fever.  Develop new symptoms.  Have difficulty walking or doing everyday activities.  Have pain that gets worse or does not get better with medicine.  Develop red skin or a feeling of warmth in your hip area. Get help right away if you:  Cannot move your hip.  Have severe pain. Summary  Hip bursitis is inflammation of a fluid-filled sac (bursa) in the hip joint.  Hip bursitis can cause mild to  moderate pain, and symptoms often come and go over time.  This condition is treated with rest, ice, compression, elevation, and medicines. This information is not intended to replace advice given to you by your health care provider. Make sure you discuss any questions you have with your health care provider. Document Revised: 08/01/2018 Document Reviewed: 08/01/2018 Elsevier Patient Education  Erin Cunningham.

## 2020-07-10 NOTE — Progress Notes (Signed)
Careteam: Patient Care Team: Lauree Chandler, NP as PCP - General (Geriatric Medicine) [name], MD as Consulting Physician (Otolaryngology)  PLACE OF SERVICE:  [place] Directive information    No Known Allergies  Chief Complaint  Patient presents with   Acute Visit    Right hip/thigh pain getting worse      HPI: Patient is a 79 y.o. female due to significant right hip pain.  If she breaths hard or moves a certain way it pains her. Any pressure on foot it hurts. 8/10. Reports throbbing cessation.  Tylenol helped somewhat when she tired it but has not taken anything else. No fever, chills, swelling or warmth. No injury or falls noted  Reports she is fasting and request fasting labs to be done at this time.    Review of Systems:  Review of Systems  Constitutional: Negative for chills, fever and malaise/fatigue.  Respiratory: Negative for cough and shortness of breath.   Cardiovascular: Negative for chest pain and leg swelling.  Musculoskeletal: Positive for joint pain and myalgias. Negative for falls.  Neurological: Negative for dizziness, tingling, sensory change, weakness and headaches.    Past Medical History:  Diagnosis Date   Breast CA ([place]) 2006   right ([place])   Cancer ([place])    Helicobacter pylori gastritis 2001   High blood pressure    History of adenomatous polyp of colon 09/23/2017   Oma 09/25/2017 and apparently polyps in the past as well though recalled age   History of shingles    Major depressive disorder 05/28/2020   Major depressive disorder, recurrent, severe without psychotic features ([place]) 06/24/2015   Parotid mass 2014   right benign cyst   Personal history of radiation therapy 2006   Rt breast   Wears glasses    Past Surgical History:  Procedure Laterality Date   ABDOMINAL HYSTERECTOMY  1982   BSO   BREAST LUMPECTOMY Right 2007   BREAST LUMPECTOMY WITH SENTINEL LYMPH NODE BIOPSY  2005   right    BREAST SURGERY  2004   rt br mass   CESAREAN SECTION     COLONOSCOPY     ESOPHAGOGASTRODUODENOSCOPY  2001   H pylori gastritis   EYE SURGERY     both cataracts   PAROTIDECTOMY Right 09/05/2013   Procedure: RIGHT SUPERFICIAL PAROTIDECTOMY WITH FACIAL NERVE DISECTION;  Surgeon: [name], MD;  Location: [facility];  Service: ENT;  Laterality: Right;  All documentation done by R.Ward after [phone] --done under G. Garzon's password   RE-EXCISION OF BREAST LUMPECTOMY  2005   right   Social History:   reports that she has never smoked. She quit smokeless tobacco use about 2 years ago.  Her smokeless tobacco use included chew. She reports current alcohol use. She reports that she does not use drugs.  Family History  Problem Relation Age of Onset   Heart disease Mother        MI   Cancer Father        bone   Thyroid disease Neg Hx    Hypercalcemia Neg Hx    Colon cancer Neg Hx    Stomach cancer Neg Hx    Rectal cancer Neg Hx    Esophageal cancer Neg Hx     Medications: Patient's Medications  New Prescriptions   No medications on file  Previous Medications   ASPIRIN EC 81 MG EC TABLET    Take 1 tablet (81 mg total)  by mouth daily. Swallow whole.   ATORVASTATIN (LIPITOR) 80 MG TABLET    Take 1 tablet (80 mg total) by mouth daily.   CALCIUM CARBONATE-VITAMIN D (CALTRATE 600+D PO)    Take 1 tablet by mouth in the morning and at bedtime. Take one by mouth twice daily.    CARVEDILOL (COREG) 6.25 MG TABLET    Take 1 tablet (6.25 mg total) by mouth 2 (two) times daily with a meal.   CLOPIDOGREL (PLAVIX) 75 MG TABLET    Take 1 tablet (75 mg total) by mouth daily.   HYDROCHLOROTHIAZIDE (HYDRODIURIL) 12.5 MG TABLET    Take 1 tablet (12.5 mg total) by mouth daily.   LOSARTAN (COZAAR) 100 MG TABLET    Take 1 tablet (100 mg total) by mouth daily.   MULTIPLE VITAMINS-MINERALS (CENTRUM MULTIGUMMIES) CHEW    Chew 2 tablets by mouth daily.   SERTRALINE  (ZOLOFT) 50 MG TABLET    Take 1.5 tablets (75 mg total) by mouth daily.   TRAZODONE (DESYREL) 50 MG TABLET    Take 1 tablet (50 mg total) by mouth at bedtime as needed for sleep.  Modified Medications   No medications on file  Discontinued Medications   No medications on file    Physical Exam:  Vitals:   07/10/20 0952  BP: (!) 152/80  Pulse: (!) 51  Temp: (!) 96.9 F (36.1 C)  TempSrc: Temporal  SpO2: 98%  Weight: 163 lb 12.8 oz (74.3 kg)  Height: '5\' 3"'  (1.6 m)   Body mass index is 29.02 kg/m. Wt Readings from Last 3 Encounters:  07/10/20 163 lb 12.8 oz (74.3 kg)  06/28/20 165 lb 6.4 oz (75 kg)  06/07/20 165 lb 3.2 oz (74.9 kg)    Physical Exam Constitutional:      Appearance: Normal appearance.  Cardiovascular:     Rate and Rhythm: Normal rate and regular rhythm.  Musculoskeletal:     Right lower leg: No edema.     Left lower leg: No edema.       Legs:     Comments: Significant tenderness along the right trochanter bursa   Skin:    General: Skin is warm and dry.     Findings: No erythema or rash.  Neurological:     Mental Status: She is alert.     Motor: No weakness.     Gait: Gait abnormal (due to pain).  Psychiatric:        Mood and Affect: Mood normal.        Behavior: Behavior normal.     Labs reviewed: Basic Metabolic Panel: Recent Labs    01/15/20 1034 05/28/20 1201 06/28/20 1331  NA 141 139 139  K 4.3 4.0 4.0  CL 109 105 108  CO2 '25 26 26  ' GLUCOSE 86 102* 116*  BUN '18 15 20  ' CREATININE 0.86 0.86 1.05*  CALCIUM 10.5* 10.2 10.8*   Liver Function Tests: Recent Labs    01/15/20 1034  AST 13  ALT 7  BILITOT 0.5  PROT 6.5   No results for input(s): LIPASE, AMYLASE in the last 8760 hours. No results for input(s): AMMONIA in the last 8760 hours. CBC: Recent Labs    01/15/20 1034 05/28/20 1201  WBC 5.7 5.9  NEUTROABS 2,879 3.0  HGB 13.0 14.4  HCT 39.0 45.3  MCV 86.3 90.1  PLT 311 322   Lipid Panel: Recent Labs     01/15/20 1034 05/29/20 0543  CHOL 204* 211*  HDL 46* 44  LDLCALC 138* 145*  TRIG 97 111  CHOLHDL 4.4 4.8   TSH: No results for input(s): TSH in the last 8760 hours. A1C: Lab Results  Component Value Date   HGBA1C 5.6 05/29/2020     Assessment/Plan 1. Trochanteric bursitis of right hip -education provided about ice, elevation of hip and rest -to start prednisone taper today -can also use tylenol 650 mg every 6 hours as needed - predniSONE (STERAPRED UNI-PAK 21 TAB) 10 MG (21) TBPK tablet; Use as directed  Dispense: 21 tablet; Refill: 0  2. Essential hypertension, benign Slightly elevated today, contributing to pain, we will continue to monitor.  - CBC with Differential/Platelet - CMP with eGFR([facility]) - COMPLETE METABOLIC PANEL WITH GFR  3. Abnormal kidney function - BASIC METABOLIC PANEL WITH GFR  4. Hyperlipidemia LDL goal <70 -started on lipitor after stroke, encouraged dietary modifications, fasting today.  - Lipid Panel - CMP with eGFR([facility]) - COMPLETE METABOLIC PANEL WITH GFR  5. Stroke, recent, without late effect -continues on lipitor for control of lipids, continues on antihypertensives with  - Lipid Panel - CBC with Differential/Platelet - CMP with eGFR([facility])  Next appt: 07/16/2020 as scheduled.  Carlos American. [place], [facility] [phone]

## 2020-07-11 LAB — CBC WITH DIFFERENTIAL/PLATELET
Absolute Monocytes: 503 cells/uL (ref 200–950)
Basophils Absolute: 143 cells/uL (ref 0–200)
Basophils Relative: 2.1 %
Eosinophils Absolute: 442 cells/uL (ref 15–500)
Eosinophils Relative: 6.5 %
HCT: 40.8 % (ref 35.0–45.0)
Hemoglobin: 13.2 g/dL (ref 11.7–15.5)
Lymphs Abs: 2156 cells/uL (ref 850–3900)
MCH: 28.6 pg (ref 27.0–33.0)
MCHC: 32.4 g/dL (ref 32.0–36.0)
MCV: 88.5 fL (ref 80.0–100.0)
MPV: 12.2 fL (ref 7.5–12.5)
Monocytes Relative: 7.4 %
Neutro Abs: 3556 cells/uL (ref 1500–7800)
Neutrophils Relative %: 52.3 %
Platelets: 289 10*3/uL (ref 140–400)
RBC: 4.61 10*6/uL (ref 3.80–5.10)
RDW: 12.7 % (ref 11.0–15.0)
Total Lymphocyte: 31.7 %
WBC: 6.8 10*3/uL (ref 3.8–10.8)

## 2020-07-11 LAB — COMPLETE METABOLIC PANEL WITH GFR
AG Ratio: 1.4 (calc) (ref 1.0–2.5)
ALT: 10 U/L (ref 6–29)
AST: 15 U/L (ref 10–35)
Albumin: 3.9 g/dL (ref 3.6–5.1)
Alkaline phosphatase (APISO): 112 U/L (ref 37–153)
BUN: 13 mg/dL (ref 7–25)
CO2: 28 mmol/L (ref 20–32)
Calcium: 10.8 mg/dL — ABNORMAL HIGH (ref 8.6–10.4)
Chloride: 107 mmol/L (ref 98–110)
Creat: 0.89 mg/dL (ref 0.60–0.93)
GFR, Est African American: 72 mL/min/{1.73_m2} (ref >=60)
GFR, Est Non African American: 62 mL/min/{1.73_m2} (ref >=60)
Globulin: 2.8 g/dL (calc) (ref 1.9–3.7)
Glucose, Bld: 98 mg/dL (ref 65–99)
Potassium: 4.1 mmol/L (ref 3.5–5.3)
Sodium: 141 mmol/L (ref 135–146)
Total Bilirubin: 0.4 mg/dL (ref 0.2–1.2)
Total Protein: 6.7 g/dL (ref 6.1–8.1)

## 2020-07-11 LAB — LIPID PANEL
Cholesterol: 119 mg/dL (ref <200)
HDL: 43 mg/dL — ABNORMAL LOW (ref >=50)
LDL Cholesterol (Calc): 58 mg/dL (calc)
Non-HDL Cholesterol (Calc): 76 mg/dL (calc) (ref <130)
Total CHOL/HDL Ratio: 2.8 (calc) (ref <5.0)
Triglycerides: 95 mg/dL (ref <150)

## 2020-07-15 ENCOUNTER — Ambulatory Visit: Payer: BC Managed Care – PPO | Admitting: Nurse Practitioner

## 2020-07-15 ENCOUNTER — Ambulatory Visit: Payer: Medicare Other | Admitting: Nurse Practitioner

## 2020-07-16 ENCOUNTER — Other Ambulatory Visit: Payer: Self-pay | Admitting: Nurse Practitioner

## 2020-07-16 ENCOUNTER — Other Ambulatory Visit: Payer: Self-pay

## 2020-07-16 ENCOUNTER — Telehealth: Payer: Self-pay

## 2020-07-16 ENCOUNTER — Encounter: Payer: Self-pay | Admitting: Nurse Practitioner

## 2020-07-16 ENCOUNTER — Ambulatory Visit (INDEPENDENT_AMBULATORY_CARE_PROVIDER_SITE_OTHER): Payer: BC Managed Care – PPO | Admitting: Nurse Practitioner

## 2020-07-16 DIAGNOSIS — M7061 Trochanteric bursitis, right hip: Secondary | ICD-10-CM

## 2020-07-16 DIAGNOSIS — Z0000 Encounter for general adult medical examination without abnormal findings: Secondary | ICD-10-CM

## 2020-07-16 NOTE — Progress Notes (Signed)
Subjective:   Erin Cunningham is a 79 y.o. female who presents for Medicare Annual (Subsequent) preventive examination.  Review of Systems           Objective:    There were no vitals filed for this visit. There is no height or weight on file to calculate BMI.  Advanced Directives 07/16/2020 05/29/2020 05/28/2020 05/27/2020 09/15/2017 05/12/2017 02/07/2017  Does Patient Have a Medical Advance Directive? No No No No No No No  Would patient like information on creating a medical advance directive? - No - Patient declined - No - Patient declined - No - Patient declined No - Patient declined  Some encounter information is confidential and restricted. Go to Review Flowsheets activity to see all data.    Current Medications (verified) Outpatient Encounter Medications as of 07/16/2020  Medication Sig  . aspirin EC 81 MG EC tablet Take 1 tablet (81 mg total) by mouth daily. Swallow whole.  Marland Kitchen atorvastatin (LIPITOR) 80 MG tablet Take 1 tablet (80 mg total) by mouth daily.  . carvedilol (COREG) 6.25 MG tablet Take 1 tablet (6.25 mg total) by mouth 2 (two) times daily with a meal.  . Cholecalciferol (VITAMIN D) 50 MCG (2000 UT) tablet Take 2,000 Units by mouth daily.  . clopidogrel (PLAVIX) 75 MG tablet Take 1 tablet (75 mg total) by mouth daily.  . hydrochlorothiazide (HYDRODIURIL) 12.5 MG tablet Take 1 tablet (12.5 mg total) by mouth daily.  Marland Kitchen losartan (COZAAR) 100 MG tablet Take 1 tablet (100 mg total) by mouth daily.  . sertraline (ZOLOFT) 50 MG tablet Take 1.5 tablets (75 mg total) by mouth daily.  . traZODone (DESYREL) 50 MG tablet Take 1 tablet (50 mg total) by mouth at bedtime as needed for sleep.  . [DISCONTINUED] Multiple Vitamins-Minerals (CENTRUM MULTIGUMMIES) CHEW Chew 2 tablets by mouth daily.  . [DISCONTINUED] predniSONE (STERAPRED UNI-PAK 21 TAB) 10 MG (21) TBPK tablet Use as directed   No facility-administered encounter medications on file as of 07/16/2020.    Allergies  (verified) Patient has no known allergies.   History: Past Medical History:  Diagnosis Date  . Breast CA ([place]) 2006   right [facility])  . Cancer ([place])   . Helicobacter pylori gastritis 2001  . High blood pressure   . History of adenomatous polyp of colon 09/23/2017   Oma 09/25/2017 and apparently polyps in the past as well though recalled age  . History of shingles   . Major depressive disorder 05/28/2020  . Major depressive disorder, recurrent, severe without psychotic features ([place]) 06/24/2015  . Parotid mass 2014   right benign cyst  . Personal history of radiation therapy 2006   Rt breast  . Stroke (cerebrum) ([place]) 06/28/2020   Per patient  . Wears glasses    Past Surgical History:  Procedure Laterality Date  . ABDOMINAL HYSTERECTOMY  1982   BSO  . BREAST LUMPECTOMY Right 2007  . BREAST LUMPECTOMY WITH SENTINEL LYMPH NODE BIOPSY  2005   right  . BREAST SURGERY  2004   rt br mass  . CESAREAN SECTION    . COLONOSCOPY    . ESOPHAGOGASTRODUODENOSCOPY  2001   H pylori gastritis  . EYE SURGERY     both cataracts  . PAROTIDECTOMY Right 09/05/2013   Procedure: RIGHT SUPERFICIAL PAROTIDECTOMY WITH FACIAL NERVE DISECTION;  Surgeon: [name], MD;  Location: [facility] [place];  Service: ENT;  Laterality: Right;  All documentation done by R.Ward after [phone] --done under G. Garzon's  password  . RE-EXCISION OF BREAST LUMPECTOMY  2005   right   Family History  Problem Relation Age of Onset  . Heart disease Mother        MI  . Cancer Father        bone  . Thyroid disease Neg Hx   . Hypercalcemia Neg Hx   . Colon cancer Neg Hx   . Stomach cancer Neg Hx   . Rectal cancer Neg Hx   . Esophageal cancer Neg Hx    Social History   Socioeconomic History  . Marital status: Married    Spouse name: Not on file  . Number of children: 3  . Years of education: At least some college  . Highest education level: Not on file  Occupational History  . Occupation: Middle  school Scientist, research (physical sciences): [employer]  Tobacco Use  . Smoking status: Never Smoker  . Smokeless tobacco: Former Systems developer    Types: Secondary school teacher  . Vaping Use: Never used  Substance and Sexual Activity  . Alcohol use: Yes    Alcohol/week: 0.0 standard drinks    Comment: occ  . Drug use: No  . Sexual activity: Not Currently  Other Topics Concern  . 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 Difficulty of Paying Living Expenses:   Food Insecurity:   . Worried About Charity fundraiser in the Last Year:   . Arboriculturist in the Last Year:   Transportation Needs:   . Film/video editor (Medical):   Marland Kitchen Lack of Transportation (Non-Medical):   Physical Activity:   . Days of Exercise per Week:   . Minutes of Exercise per Session:   Stress:   . Feeling of Stress :   Social Connections:   . Frequency of Communication with Friends and Family:   . Frequency of Social Gatherings with Friends and Family:   . Attends Religious Services:   . Active Member of Clubs or Organizations:   . Attends Archivist Meetings:   Marland Kitchen Marital Status:     Tobacco Counseling Counseling given: Not Answered   Clinical Intake:                 Diabetic?no         Activities of Daily Living In your present state of health, do you have any difficulty performing the following activities: 05/29/2020  Hearing? N  Vision? N  Difficulty concentrating or making decisions? N  Walking or climbing stairs? Y  Dressing or bathing? N  Doing errands, shopping? N  Some recent data might be hidden    Patient Care Team: Lauree Chandler, NP as PCP - General (Geriatric Medicine) [name], MD as Consulting Physician (Otolaryngology)  Indicate any recent Medical Services you may have received from other than [facility] providers in the past year (date may be approximate).     Assessment:   This is a routine wellness examination for [place].  Hearing/Vision screen  Hearing Screening   125Hz  250Hz  500Hz  1000Hz  2000Hz  3000Hz  4000Hz  6000Hz  8000Hz   Right ear:           Left ear:           Comments: No hearing problems  Vision Screening Comments: No vision problems. Patient wears glasses  Dietary issues and exercise activities discussed:    Goals   None    Depression Screen PHQ 2/9 Scores 07/16/2020 01/15/2020 06/15/2018 04/12/2018 12/02/2016 02/15/2015 01/16/2015  PHQ - 2 Score 0 0 0 0 2 0 2  PHQ- 9 Score - - - - - - 2  Exception Documentation - (No Data) - - - - -    Fall Risk Fall Risk  07/16/2020 06/28/2020 01/15/2020 07/10/2019 03/14/2019  Falls in the past year? 0 0 0 1 1  Number falls in past yr: 0 0 0 0 0  Injury with Fall? 0 0 0 1 0    Any stairs in or around the home? Yes  If so, are there any without handrails? Yes  Home free of loose throw rugs in walkways, pet beds, electrical cords, etc? Yes  Adequate lighting in your home to reduce risk of falls? Yes   ASSISTIVE DEVICES UTILIZED TO PREVENT FALLS:  Life alert? No  Use of a cane, walker or w/c? No  Grab bars in the bathroom? No  Shower chair or bench in shower? No  Elevated toilet seat or a handicapped toilet? No   TIMED UP AND GO: na Cognitive Function: MMSE - Mini Mental State Exam 02/15/2015  Orientation to time 4  Orientation to Place 5  Registration 3  Attention/ Calculation 5  Recall 3  Language- name 2 objects 2  Language- repeat 1  Language- follow 3 step command 3  Language- read & follow direction 1  Write a sentence 1  Copy design 1  Total score 29     6CIT Screen 07/16/2020  What Year? 0 points  What month? 0 points   What time? 0 points  Count back from 20 0 points  Months in reverse 0 points  Repeat phrase 0 points  Total Score 0    Immunizations Immunization History  Administered Date(s) Administered  . Fluad Quad(high Dose 65+) 01/15/2020  . Influenza, High Dose Seasonal PF 09/14/2018  . Influenza,inj,Quad PF,6+ Mos 11/06/2015  . PFIZER SARS-COV-2 Vaccination 02/03/2020, 02/24/2020  . PPD Test 12/10/2015  . Pneumococcal Conjugate-13 01/16/2015  . Pneumococcal Polysaccharide-23 05/12/2017  . Tdap 06/15/2018    TDAP status: Up to date Flu Vaccine status: Up to date Pneumococcal vaccine status: Up to date Covid-19 vaccine status: Completed vaccines  Qualifies for Shingles Vaccine? Yes   Zostavax completed No   Shingrix Completed?: No.    Education has been provided regarding the importance of this vaccine. Patient has been advised to call insurance company to determine out of pocket expense if they have not yet received this vaccine. Advised may also receive vaccine at local pharmacy or Health Dept. Verbalized acceptance and understanding.  Screening Tests Health Maintenance  Topic Date Due  . Hepatitis C Screening  Never done  . INFLUENZA VACCINE  07/07/2020  . TETANUS/TDAP  06/15/2028  . DEXA SCAN  Completed  . COVID-19 Vaccine  Completed    Health Maintenance  Health Maintenance Due  Topic Date Due  . Hepatitis C Screening  Never done  . INFLUENZA VACCINE  07/07/2020    Colorectal cancer screening: No longer required.  Mammogram status: Completed 2021. Repeat every year Bone Density status: Completed 2021. Results reflect: Bone density results: OSTEOPENIA. Repeat every  2 years.  Lung Cancer Screening: (Low Dose CT Chest recommended if Age 1-80 years, 30 pack-year currently smoking OR have quit w/in 15years.) does not qualify.   Lung Cancer Screening Referral: na  Additional Screening:  Hepatitis C Screening: does qualify; to complete wth blood work  Vision  Screening: Recommended annual ophthalmology exams for early detection of glaucoma and other disorders of the eye. Is the patient up to date with their annual eye exam?  No  Who is the provider or what is the name of the office in which the patient attends annual eye exams? Does not have If pt is not established with a provider, would they like to be referred to a provider to establish care? Yes .   Dental Screening: Recommended annual dental exams for proper oral hygiene  Community Resource Referral / Chronic Care Management: CRR required this visit?  No   CCM required this visit?  No      Plan:     I have personally reviewed and noted the following in the patient's chart:   . Medical and social history . Use of alcohol, tobacco or illicit drugs  . Current medications and supplements . Functional ability and status . Nutritional status . Physical activity . Advanced directives . List of other physicians . Hospitalizations, surgeries, and ER visits in previous 12 months . Vitals . Screenings to include cognitive, depression, and falls . Referrals and appointments  In addition, I have reviewed and discussed with patient certain preventive protocols, quality metrics, and best practice recommendations. A written personalized care plan for preventive services as well as general preventive health recommendations were provided to patient.     Lauree Chandler, NP   07/16/2020

## 2020-07-16 NOTE — Telephone Encounter (Signed)
Ms. Erin Cunningham, Erin Cunningham are scheduled for a virtual visit with your provider today.    Just as we do with appointments in the office, we must obtain your consent to participate.  Your consent will be active for this visit and any virtual visit you may have with one of our providers in the next 365 days.    If you have a MyChart account, I can also send a copy of this consent to you electronically.  All virtual visits are billed to your insurance company just like a traditional visit in the office.  As this is a virtual visit, video technology does not allow for your provider to perform a traditional examination.  This may limit your provider's ability to fully assess your condition.  If your provider identifies any concerns that need to be evaluated in person or the need to arrange testing such as labs, EKG, etc, we will make arrangements to do so.    Although advances in technology are sophisticated, we cannot ensure that it will always work on either your end or our end.  If the connection with a video visit is poor, we may have to switch to a telephone visit.  With either a video or telephone visit, we are not always able to ensure that we have a secure connection.   I need to obtain your verbal consent now.   Are you willing to proceed with your visit today?   Erin Cunningham has provided verbal consent on 07/16/2020 for a virtual visit (video or telephone).   Carroll Kinds, CMA 07/16/2020  9:00 AM

## 2020-07-16 NOTE — Progress Notes (Signed)
Pt notified that right hip pain has improved with prednisone but worsen once complete. Will refer to orthopedic at this time for evaluation of injection.

## 2020-07-16 NOTE — Progress Notes (Signed)
This service is provided via telemedicine  No vital signs collected/recorded due to the encounter was a telemedicine visit.   Location of patient (ex: home, work):  Home  Patient consents to a telephone visit:  Yes, see encounter dated 07/16/2020  Location of the provider (ex: office, home):  [place]  Name of any referring provider:  N/A  Names of all persons participating in the telemedicine service and their role in the encounter:  Sherrie Mustache, Nurse Practitioner, Carroll Kinds, CMA, and patient.   Time spent on call:  9 Minutes with medical assistant

## 2020-07-16 NOTE — Patient Instructions (Signed)
Erin Cunningham , Thank you for taking time to come for your Medicare Wellness Visit. I appreciate your ongoing commitment to your health goals. Please review the following plan we discussed and let me know if I can assist you in the future.   Screening recommendations/referrals: Colonoscopy aged out Mammogram up to date Bone Density up to date Recommended yearly ophthalmology/optometry visit for glaucoma screening and checkup Recommended yearly dental visit for hygiene and checkup  Vaccinations: Influenza vaccine DUE- to get at local pharmacy or come to office.  Pneumococcal vaccine up to date Tdap vaccine up to date  Shingles vaccine RECOMMENDED    Advanced directives: recommended to complete and we can put on file. To look over MOST form.   Conditions/risks identified: advance age, hx of stroke, hypertension, hyperlipidemia Next appointment: 1 year   Preventive Care 78 Years and Older, Female Preventive care refers to lifestyle choices and visits with your health care provider that can promote health and wellness. What does preventive care include?  A yearly physical exam. This is also called an annual well check.  Dental exams once or twice a year.  Routine eye exams. Ask your health care provider how often you should have your eyes checked.  Personal lifestyle choices, including:  Daily care of your teeth and gums.  Regular physical activity.  Eating a healthy diet.  Avoiding tobacco and drug use.  Limiting alcohol use.  Practicing safe sex.  Taking low-dose aspirin every day.  Taking vitamin and mineral supplements as recommended by your health care provider. What happens during an annual well check? The services and screenings done by your health care provider during your annual well check will depend on your age, overall health, lifestyle risk factors, and family history of disease. Counseling  Your health care provider may ask you questions about  your:  Alcohol use.  Tobacco use.  Drug use.  Emotional well-being.  Home and relationship well-being.  Sexual activity.  Eating habits.  History of falls.  Memory and ability to understand (cognition).  Work and work Statistician.  Reproductive health. Screening  You may have the following tests or measurements:  Height, weight, and BMI.  Blood pressure.  Lipid and cholesterol levels. These may be checked every 5 years, or more frequently if you are over 51 years old.  Skin check.  Lung cancer screening. You may have this screening every year starting at age 41 if you have a 30-pack-year history of smoking and currently smoke or have quit within the past 15 years.  Fecal occult blood test (FOBT) of the stool. You may have this test every year starting at age 47.  Flexible sigmoidoscopy or colonoscopy. You may have a sigmoidoscopy every 5 years or a colonoscopy every 10 years starting at age 3.  Hepatitis C blood test.  Hepatitis B blood test.  Sexually transmitted disease (STD) testing.  Diabetes screening. This is done by checking your blood sugar (glucose) after you have not eaten for a while (fasting). You may have this done every 1-3 years.  Bone density scan. This is done to screen for osteoporosis. You may have this done starting at age 70.  Mammogram. This may be done every 1-2 years. Talk to your health care provider about how often you should have regular mammograms. Talk with your health care provider about your test results, treatment options, and if necessary, the need for more tests. Vaccines  Your health care provider may recommend certain vaccines, such as:  Influenza vaccine.  This is recommended every year.  Tetanus, diphtheria, and acellular pertussis (Tdap, Td) vaccine. You may need a Td booster every 10 years.  Zoster vaccine. You may need this after age 68.  Pneumococcal 13-valent conjugate (PCV13) vaccine. One dose is recommended  after age 76.  Pneumococcal polysaccharide (PPSV23) vaccine. One dose is recommended after age 20. Talk to your health care provider about which screenings and vaccines you need and how often you need them. This information is not intended to replace advice given to you by your health care provider. Make sure you discuss any questions you have with your health care provider. Document Released: 12/20/2015 Document Revised: 08/12/2016 Document Reviewed: 09/24/2015 Elsevier Interactive Patient Education  2017 [employer] Prevention in the Home Falls can cause injuries. They can happen to people of all ages. There are many things you can do to make your home safe and to help prevent falls. What can I do on the outside of my home?  Regularly fix the edges of walkways and driveways and fix any cracks.  Remove anything that might make you trip as you walk through a door, such as a raised step or threshold.  Trim any bushes or trees on the path to your home.  Use bright outdoor lighting.  Clear any walking paths of anything that might make someone trip, such as rocks or tools.  Regularly check to see if handrails are loose or broken. Make sure that both sides of any steps have handrails.  Any raised decks and porches should have guardrails on the edges.  Have any leaves, snow, or ice cleared regularly.  Use sand or salt on walking paths during winter.  Clean up any spills in your garage right away. This includes oil or grease spills. What can I do in the bathroom?  Use night lights.  Install grab bars by the toilet and in the tub and shower. Do not use towel bars as grab bars.  Use non-skid mats or decals in the tub or shower.  If you need to sit down in the shower, use a plastic, non-slip stool.  Keep the floor dry. Clean up any water that spills on the floor as soon as it happens.  Remove soap buildup in the tub or shower regularly.  Attach bath mats securely with  double-sided non-slip rug tape.  Do not have throw rugs and other things on the floor that can make you trip. What can I do in the bedroom?  Use night lights.  Make sure that you have a light by your bed that is easy to reach.  Do not use any sheets or blankets that are too big for your bed. They should not hang down onto the floor.  Have a firm chair that has side arms. You can use this for support while you get dressed.  Do not have throw rugs and other things on the floor that can make you trip. What can I do in the kitchen?  Clean up any spills right away.  Avoid walking on wet floors.  Keep items that you use a lot in easy-to-reach places.  If you need to reach something above you, use a strong step stool that has a grab bar.  Keep electrical cords out of the way.  Do not use floor polish or wax that makes floors slippery. If you must use wax, use non-skid floor wax.  Do not have throw rugs and other things on the floor that can make  you trip. What can I do with my stairs?  Do not leave any items on the stairs.  Make sure that there are handrails on both sides of the stairs and use them. Fix handrails that are broken or loose. Make sure that handrails are as long as the stairways.  Check any carpeting to make sure that it is firmly attached to the stairs. Fix any carpet that is loose or worn.  Avoid having throw rugs at the top or bottom of the stairs. If you do have throw rugs, attach them to the floor with carpet tape.  Make sure that you have a light switch at the top of the stairs and the bottom of the stairs. If you do not have them, ask someone to add them for you. What else can I do to help prevent falls?  Wear shoes that:  Do not have high heels.  Have rubber bottoms.  Are comfortable and fit you well.  Are closed at the toe. Do not wear sandals.  If you use a stepladder:  Make sure that it is fully opened. Do not climb a closed stepladder.  Make  sure that both sides of the stepladder are locked into place.  Ask someone to hold it for you, if possible.  Clearly mark and make sure that you can see:  Any grab bars or handrails.  First and last steps.  Where the edge of each step is.  Use tools that help you move around (mobility aids) if they are needed. These include:  Canes.  Walkers.  Scooters.  Crutches.  Turn on the lights when you go into a dark area. Replace any light bulbs as soon as they burn out.  Set up your furniture so you have a clear path. Avoid moving your furniture around.  If any of your floors are uneven, fix them.  If there are any pets around you, be aware of where they are.  Review your medicines with your doctor. Some medicines can make you feel dizzy. This can increase your chance of falling. Ask your doctor what other things that you can do to help prevent falls. This information is not intended to replace advice given to you by your health care provider. Make sure you discuss any questions you have with your health care provider. Document Released: 09/19/2009 Document Revised: 04/30/2016 Document Reviewed: 12/28/2014 Elsevier Interactive Patient Education  2017 [employer].

## 2020-07-18 ENCOUNTER — Telehealth: Payer: Self-pay | Admitting: *Deleted

## 2020-07-18 MED ORDER — MELOXICAM 15 MG PO TABS: 15.0000 mg | ORAL_TABLET | Freq: Every day | ORAL | 0 refills | Status: DC

## 2020-07-18 NOTE — Telephone Encounter (Signed)
Patient notified and agreed.   Pended Medication and sent to [facility] for approval due to [place].

## 2020-07-18 NOTE — Telephone Encounter (Signed)
Patient called and stated that she is having severe pain with the Bursitis. Pain Level is 9/10.  Stated that she could not get a sooner appointment with [facility]. Appointment is 07/23/2020 @ 9am. Patient is currently taking Tylenol 650mg  with no relief.  Wanting to know if you could prescribe something until she is able to see [facility].  Please Advise.

## 2020-07-18 NOTE — Telephone Encounter (Signed)
LMOM to return call.  Medication added to Current medication list and Pended until patient returns call.

## 2020-07-18 NOTE — Telephone Encounter (Signed)
Lets have her add mobic 15 mg daily #30/0 refills (this is an NSAID so can effect kidney function long term so make sure to stay hydrated and we will only use short term while the pain is severe)  She can still use the tylenol with this medication as well.

## 2020-07-23 ENCOUNTER — Telehealth: Payer: Self-pay

## 2020-07-23 ENCOUNTER — Ambulatory Visit (INDEPENDENT_AMBULATORY_CARE_PROVIDER_SITE_OTHER): Payer: BC Managed Care – PPO | Admitting: Family

## 2020-07-23 ENCOUNTER — Ambulatory Visit (INDEPENDENT_AMBULATORY_CARE_PROVIDER_SITE_OTHER): Payer: BC Managed Care – PPO

## 2020-07-23 ENCOUNTER — Encounter: Payer: Self-pay | Admitting: Family

## 2020-07-23 VITALS — Ht 63.0 in | Wt 163.8 lb

## 2020-07-23 DIAGNOSIS — M5441 Lumbago with sciatica, right side: Secondary | ICD-10-CM

## 2020-07-23 MED ORDER — HYDROCODONE-ACETAMINOPHEN 5-325 MG PO TABS: 1.0000 | ORAL_TABLET | Freq: Four times a day (QID) | ORAL | 0 refills | Status: DC | PRN

## 2020-07-23 MED ORDER — PREDNISONE 50 MG PO TABS: ORAL_TABLET | ORAL | 0 refills | Status: DC

## 2020-07-23 NOTE — Progress Notes (Signed)
Office Visit Note   Patient: Erin Cunningham           Date of Birth: 1940/12/27           MRN: [id number] Visit Date: 07/23/2020              Requested by: Lauree Chandler, NP [place],  [place] [address] PCP: Lauree Chandler, NP  Chief Complaint  Patient presents with  . Right Hip - Pain, New Patient (Initial Visit)      HPI: Patient is a 79 year old woman who presented complaining of a history of low back pain as well as right hip pain.  This is right-sided.  This radiates through her groin and down her anterior right thigh.  She states she is unable to get comfortable she cannot sit cannot move difficulty getting from a seated to a standing position due to pain.  She cannot think of any relieving factors.  Leaning forward as well as putting pressure on her right lower extremity make things worse.  She did have a stroke in June of this year.  She states that she has had gradual worsening of this pain since.  Her symptoms from the stroke were right-sided.  She has been seen by her primary care for the same she did try a prednisone taper which provided her with no relief  Assessment & Plan: Visit Diagnoses:  1. Acute right-sided low back pain with right-sided sciatica     Plan: We will try a prednisone burst.  For the next 5 days.  We will go ahead and send a referral to Dr. Ernestina Patches for evaluation and ESI of the lumbar spine.  Discussed with the patient showed will not get any relief with the epidural steroid injection we will likely need to do an MRI of her lumbar spine.    Follow-Up Instructions: No follow-ups on file.   Right Hip Exam   Tenderness  The patient is experiencing tenderness in the lateral (poorly localized).  Muscle Strength  The patient has normal right hip strength.   Back Exam   Tenderness  The patient is experiencing tenderness in the sacroiliac and lumbar.  Muscle Strength  The patient has normal back strength.  Tests  Straight  leg raise right: negative Straight leg raise left: negative  Other  Gait: abnormal       Patient is alert, oriented, no adenopathy, well-dressed, normal affect, normal respiratory effort. Patient sitting off to left side, unable to sit or stand with weight on RLE.  Imaging: No results found. No images are attached to the encounter.  Labs: Lab Results  Component Value Date   HGBA1C 5.6 05/29/2020   HGBA1C 5.5 01/15/2020   HGBA1C 5.7 (H) 02/24/2019     Lab Results  Component Value Date   ALBUMIN 3.8 05/12/2017   ALBUMIN 3.9 12/02/2016   ALBUMIN 4.1 06/24/2015    No results found for: MG Lab Results  Component Value Date   VD25OH 23 (L) 12/02/2016   VD25OH 18.46 (L) 04/20/2016   VD25OH 17 (L) 03/21/2009    No results found for: PREALBUMIN CBC EXTENDED Latest Ref Rng & Units 07/10/2020 05/28/2020 01/15/2020  WBC 3.8 - 10.8 Thousand/uL 6.8 5.9 5.7  RBC 3.80 - 5.10 Million/uL 4.61 5.03 4.52  HGB 11.7 - 15.5 g/dL 13.2 14.4 13.0  HCT 35 - 45 % 40.8 45.3 39.0  PLT 140 - 400 Thousand/uL 289 322 311  NEUTROABS 1,500 - 7,800 cells/uL 3,556  3.0 2,879  LYMPHSABS 850 - 3,900 cells/uL 2,156 2.1 1,927     Body mass index is 29.02 kg/m.  Orders:  Orders Placed This Encounter  Procedures  . XR Lumbar Spine 2-3 Views   Meds ordered this encounter  Medications  . predniSONE (DELTASONE) 50 MG tablet    Sig: Take one tablet by mouth once daily for 5 days.    Dispense:  5 tablet    Refill:  0  . HYDROcodone-acetaminophen (NORCO/VICODIN) 5-325 MG tablet    Sig: Take 1 tablet by mouth every 6 (six) hours as needed for moderate pain.    Dispense:  30 tablet    Refill:  0     Procedures: No procedures performed  Clinical Data: No additional findings.  ROS:  All other systems negative, except as noted in the HPI. Review of Systems  Objective: Vital Signs: Ht 5\' 3"  (1.6 m)   Wt 163 lb 12.8 oz (74.3 kg)   BMI 29.02 kg/m   Specialty Comments:  No specialty  comments available.  PMFS History: Patient Active Problem List   Diagnosis Date Noted  . Infarction of left basal ganglia ([place]) 05/29/2020  . Hypertrophic cardiomyopathy ([place]) 05/29/2020  . Anxiety and depression 05/29/2020  . Hypertensive emergency 05/28/2020  . Weakness of right lower extremity 05/28/2020  . Allergic rhinitis 06/16/2018  . Chewing tobacco nicotine dependence with nicotine-induced disorder 06/16/2018  . History of adenomatous polyp of colon 09/23/2017  . Hypercalcemia 04/20/2016  . Neck nodule 04/20/2016  . Essential hypertension, benign 07/21/2015  . Hyperlipidemia LDL goal <130 07/21/2015  . Abnormal fasting glucose 07/21/2015  . History of breast cancer in female 07/21/2015  . Major depressive disorder, recurrent, severe without psychotic features ([place]) 06/24/2015  . MDD (major depressive disorder), recurrent episode, severe ([place]) 06/24/2015  . Onychomycosis 06/12/2014  . Pain in lower limb 06/12/2014   Past Medical History:  Diagnosis Date  . Breast CA ([place]) 2006   right [facility])  . Cancer ([place])   . Helicobacter pylori gastritis 2001  . High blood pressure   . History of adenomatous polyp of colon 09/23/2017   [place] 09/25/2017 and apparently polyps in the past as well though recalled age  . History of shingles   . Major depressive disorder 05/28/2020  . Major depressive disorder, recurrent, severe without psychotic features ([place]) 06/24/2015  . Parotid mass 2014   right benign cyst  . Personal history of radiation therapy 2006   Rt breast  . Stroke (cerebrum) ([place]) 06/28/2020   Per patient  . Wears glasses     Family History  Problem Relation Age of Onset  . Heart disease Mother        MI  . Cancer Father        bone  . Thyroid disease Neg Hx   . Hypercalcemia Neg Hx   . Colon cancer Neg Hx   . Stomach cancer Neg Hx   . Rectal cancer Neg Hx   . Esophageal cancer Neg Hx     Past Surgical History:  Procedure Laterality Date  . ABDOMINAL  HYSTERECTOMY  1982   BSO  . BREAST LUMPECTOMY Right 2007  . BREAST LUMPECTOMY WITH SENTINEL LYMPH NODE BIOPSY  2005   right  . BREAST SURGERY  2004   rt br mass  . CESAREAN SECTION    . COLONOSCOPY    . ESOPHAGOGASTRODUODENOSCOPY  2001   H pylori gastritis  . EYE SURGERY     both cataracts  .  PAROTIDECTOMY Right 09/05/2013   Procedure: RIGHT SUPERFICIAL PAROTIDECTOMY WITH FACIAL NERVE DISECTION;  Surgeon: [name], MD;  Location: [facility];  Service: ENT;  Laterality: Right;  All documentation done by R.Ward after [phone] --done under G. Garzon's password  . RE-EXCISION OF BREAST LUMPECTOMY  2005   right   Social History   Occupational History  . Occupation: Middle school Scientist, research (physical sciences): [employer]  Tobacco Use  . Smoking status: Never Smoker  . Smokeless tobacco: Former Systems developer    Types: Secondary school teacher  . Vaping Use: Never used  Substance and Sexual Activity  . Alcohol use: Yes    Alcohol/week: 0.0 standard drinks    Comment: occ  . Drug use: No  . Sexual activity: Not Currently

## 2020-07-23 NOTE — Telephone Encounter (Signed)
Prior auth submitted through cover my meds for Hydrocodone 5/325. Will hold this message pending approval.

## 2020-07-24 NOTE — Telephone Encounter (Signed)
Authorization approved today for Hydrocodone.

## 2020-07-24 NOTE — Addendum Note (Signed)
Addended by: Suzan Slick on: 07/24/2020 08:21 AM   Modules accepted: Orders

## 2020-07-26 ENCOUNTER — Telehealth: Payer: Self-pay | Admitting: Family

## 2020-07-26 NOTE — Telephone Encounter (Signed)
Patient called.   Said no relief came from her last appointment. Requesting a call back to discuss what her next course of action should be.   Call back: [phone]

## 2020-07-26 NOTE — Telephone Encounter (Signed)
I called pt and she wanted to advise that she was not able to pick up her pain medication. I advised that this required prior auth and had been obtained the next day so she should be able to go and pick that up. Aslo the referral for Dr. Ernestina Patches is being processed and Josem Kaufmann is being obtained from [employer] and once this has been done they will call to offer a day and time. Pt voiced understanding and will call with any other questions.

## 2020-07-30 ENCOUNTER — Other Ambulatory Visit: Payer: Self-pay | Admitting: Physical Medicine and Rehabilitation

## 2020-07-30 DIAGNOSIS — F411 Generalized anxiety disorder: Secondary | ICD-10-CM

## 2020-07-30 MED ORDER — DIAZEPAM 5 MG PO TABS: ORAL_TABLET | ORAL | 0 refills | Status: DC

## 2020-07-30 NOTE — Progress Notes (Signed)
Pre-procedure diazepam ordered for pre-operative anxiety.  

## 2020-08-01 ENCOUNTER — Ambulatory Visit (INDEPENDENT_AMBULATORY_CARE_PROVIDER_SITE_OTHER): Payer: BC Managed Care – PPO | Admitting: Physical Medicine and Rehabilitation

## 2020-08-01 ENCOUNTER — Encounter: Payer: Self-pay | Admitting: Neurology

## 2020-08-01 ENCOUNTER — Other Ambulatory Visit: Payer: Self-pay

## 2020-08-01 ENCOUNTER — Ambulatory Visit: Payer: Self-pay

## 2020-08-01 ENCOUNTER — Encounter: Payer: Self-pay | Admitting: Physical Medicine and Rehabilitation

## 2020-08-01 VITALS — BP 161/90 | HR 58

## 2020-08-01 DIAGNOSIS — R202 Paresthesia of skin: Secondary | ICD-10-CM

## 2020-08-01 DIAGNOSIS — M5416 Radiculopathy, lumbar region: Secondary | ICD-10-CM | POA: Diagnosis not present

## 2020-08-01 DIAGNOSIS — M25551 Pain in right hip: Secondary | ICD-10-CM

## 2020-08-01 DIAGNOSIS — M4316 Spondylolisthesis, lumbar region: Secondary | ICD-10-CM

## 2020-08-01 MED ORDER — PREGABALIN 75 MG PO CAPS: ORAL_CAPSULE | ORAL | 0 refills | Status: DC

## 2020-08-01 MED ORDER — METHYLPREDNISOLONE ACETATE 80 MG/ML IJ SUSP
80.0000 mg | Freq: Once | INTRAMUSCULAR | Status: AC
  Administered 2020-08-01: 80 mg

## 2020-08-01 NOTE — Progress Notes (Signed)
Pt state right hip pain that travels to her groin back around to her back right leg. Pt state takes tylenol 600mg  every three to four hours. Pt state standing and walking makes the pain worse.  Numeric Pain Rating Scale and Functional Assessment Average Pain 8   In the last MONTH (on 0-10 scale) has pain interfered with the following?  1. General activity like being  able to carry out your everyday physical activities such as walking, climbing stairs, carrying groceries, or moving a chair?  Rating(4)   +Driver, -BT, -Dye Allergies.

## 2020-08-01 NOTE — Progress Notes (Signed)
Erin Cunningham - 79 y.o. female MRN [id number]  Date of birth: 20-Feb-1941  Office Visit Note: Visit Date: 08/01/2020 PCP: Lauree Chandler, NP Referred by: Lauree Chandler, NP  Subjective: Chief Complaint  Patient presents with  . Right Hip - Pain  . Right Leg - Pain   HPI: Erin Cunningham is a 79 y.o. female who comes in today At the request of Dondra Prader, [place] for diagnostic and hopefully therapeutic epidural injection.  The patient's history is somewhat confusing and interesting.  She reports that in June she was having symptoms of ataxia but was also having some hip and leg pain at the time as well.  She was seen in the emergency department and they diagnosed a stroke in the putamen and all these notes were reviewed today.  She followed up with her primary care provider Sherrie Mustache, FNP.  She was referred to a neurologist at that point but has not seen the neurologist.  She was also referred to orthopedics for diagnosis of right hip pain and trochanteric bursitis.  She has not had MRI of the lumbar spine or hip.  She has had x-rays.  Dondra Prader, FN-P saw her and felt like this was more related to a radicular pain in the hip pain.  She suggested diagnostic and therapeutic injection do to the severity of the symptoms but without red flag complaints of focal weakness or bowel bladder changes etc.  No specific trauma.  Today the patient is very animated particularly when I first enter the room very anxiety prone about the pain itself and potential for injection.  We had actually sent in Valium preprocedure but she reports not taking it.  Her symptom complaints are right hip pain which is posterior lateral buttock and wraparound actually into the groin and front of the leg but mostly into the back of the leg and she touches her skin and has almost this allodynia or hyper pain complaint with even just light stroking of the skin.  This is more of an L4 distribution.  It goes medially past the  knee and into the medial calf.  She does endorse some dysesthesia.  No left-sided complaints.  She rates her pain as an 8 out of 10 and again with no focal weakness.  She reports that taking 600 mg of Tylenol every 3-4 hours helps greatly.  She is present with her husband but he is not really provide much in the way of history.  She does report worsening with standing and walking.  Rotating the hip in flexion does not seem to increase her pain however.  X-rays did reveal a small listhesis of L4 on L5.  I did discuss with her the use of injections with spine model we went over the risk the benefits.  She did want to proceed with the injection do to the pain that she was having.  It seems to be more of an L4 distribution I would start with an L4 transforaminal epidural steroid injection.  She is not currently on anticoagulation.  I also suggest that she should try to follow-up with a neurologist concerning the stroke.  I do know that people can get central pain syndromes with strokes particularly in the basal ganglia putamen more in the thalamic region.  This just seems to be not what she typically see with stroke central pain but we cannot rule that out at this point.  ROS Otherwise per HPI.  Assessment & Plan: Visit Diagnoses:  1. Lumbar radiculopathy   2. Spondylolisthesis of lumbar region   3. Paresthesia of skin   4. Pain in right hip     Plan: Findings:  Please see the report for details of the injection itself.  Pain response to delivery of medication was very atypical.  It could be that the nerve root is just very excited at this point and then she has got some overarching central pain syndrome.  The injection part itself that she was anxious about went extremely well.  I did prescribe Lyrica because of the nerve pain and this is typically prescribed even if there is a central pain syndrome.  She will follow up with her primary care physician and Dondra Prader.  Have also encouraged her to  follow-up with her neurologist for evaluation of this history of stroke.  Would suggest MRI of the lumbar spine at this point obviously.    Meds & Orders:  Meds ordered this encounter  Medications  . methylPREDNISolone acetate (DEPO-MEDROL) injection 80 mg  . pregabalin (LYRICA) 75 MG capsule    Sig: Take 1 capsule (75 mg total) by mouth daily for 7 days, THEN 1 capsule (75 mg total) 2 (two) times daily for 23 days.    Dispense:  53 capsule    Refill:  0    Orders Placed This Encounter  Procedures  . XR C-ARM NO REPORT  . Epidural Steroid injection    Follow-up: Return in 2 weeks (on 08/15/2020) for Ginny Forth, FN-P.   Procedures: No procedures performed  Lumbosacral Transforaminal Epidural Steroid Injection - Sub-Pedicular Approach with Fluoroscopic Guidance  Patient: Erin Cunningham      Date of Birth: January 03, 1941 MRN: [id number] PCP: Lauree Chandler, NP      Visit Date: 08/01/2020   Universal Protocol:    Date/Time: 08/01/2020  Consent Given By: the patient  Position: PRONE  Additional Comments: Vital signs were monitored before and after the procedure. Patient was prepped and draped in the usual sterile fashion. The correct patient, procedure, and site was verified.   Injection Procedure Details:  Procedure Site One Meds Administered:  Meds ordered this encounter  Medications  . methylPREDNISolone acetate (DEPO-MEDROL) injection 80 mg    Laterality: Right  Location/Site:  L4-L5  Needle size: 22 G  Needle type: Spinal  Needle Placement: Transforaminal  Findings:    -Comments: Excellent flow of contrast along the nerve, nerve root and into the epidural space. Very exaggerated pain response with medication delivery. Needle movemnent into place without any pain.  I was actually surprised given the amount of anxiety the patient was having that the actual process of needle location was without difficulty at all.  Once the medication was being delivered  along the nerve root however again very exaggerated response.  Very tearful the delivery of the medication lasted about 5 seconds.  We did deliver about 2 mL of fluid at that point.  Procedure Details: After squaring off the end-plates to get a true AP view, the C-arm was positioned so that an oblique view of the foramen as noted above was visualized. The target area is just inferior to the "nose of the scotty dog" or sub pedicular. The soft tissues overlying this structure were infiltrated with 2-3 ml. of 1% Lidocaine without Epinephrine.  The spinal needle was inserted toward the target using a "trajectory" view along the fluoroscope beam.  Under AP and lateral visualization, the needle was advanced so it did not puncture dura and was  located close the 6 O'Clock position of the pedical in AP tracterory. Biplanar projections were used to confirm position. Aspiration was confirmed to be negative for CSF and/or blood. A 1-2 ml. volume of Isovue-250 was injected and flow of contrast was noted at each level. Radiographs were obtained for documentation purposes.   After attaining the desired flow of contrast documented above, a 0.5 to 1.0 ml test dose of 0.25% Marcaine was injected into each respective transforaminal space.  The patient was observed for 90 seconds post injection.  After no sensory deficits were reported, and normal lower extremity motor function was noted,   the above injectate was administered so that equal amounts of the injectate were placed at each foramen (level) into the transforaminal epidural space.   Additional Comments:  No complications occurred Dressing: 2 x 2 sterile gauze and Band-Aid    Post-procedure details: Patient was observed during the procedure. Post-procedure instructions were reviewed.  Patient left the clinic in stable condition.      Clinical History: No specialty comments available.   She reports that she has never smoked. She quit smokeless tobacco  use about 2 years ago.  Her smokeless tobacco use included chew.  Recent Labs    01/15/20 1032 05/29/20 0543  HGBA1C 5.5 5.6    Objective:  VS:  HT:    WT:   BMI:     BP:(!) 161/90  HR:(!) 58bpm  TEMP: ( )  RESP:  Physical Exam Constitutional:      General: She is not in acute distress.    Appearance: Normal appearance. She is not ill-appearing.  HENT:     Head: Normocephalic and atraumatic.     Right Ear: External ear normal.     Left Ear: External ear normal.  Eyes:     Extraocular Movements: Extraocular movements intact.  Cardiovascular:     Rate and Rhythm: Normal rate.     Pulses: Normal pulses.  Musculoskeletal:     Right lower leg: No edema.     Left lower leg: No edema.     Comments: Patient has good distal strength.  No clonus or focal weakness.  She does not have any spasticity.  She is exquisitely tender to any type of palpation even just light stroking over the skin and more of an L4 dermatome.  Skin:    Findings: No erythema, lesion or rash.  Neurological:     General: No focal deficit present.     Mental Status: She is alert and oriented to person, place, and time.     Sensory: No sensory deficit.     Motor: No weakness or abnormal muscle tone.     Coordination: Coordination normal.  Psychiatric:        Mood and Affect: Mood normal.        Behavior: Behavior normal.     Ortho Exam  Imaging: XR C-ARM NO REPORT  Result Date: 08/01/2020 Please see Notes tab for imaging impression.   Past Medical/Family/Surgical/Social History: Medications & Allergies reviewed per EMR, new medications updated. Patient Active Problem List   Diagnosis Date Noted  . Infarction of left basal ganglia ([place]) 05/29/2020  . Hypertrophic cardiomyopathy ([place]) 05/29/2020  . Anxiety and depression 05/29/2020  . Hypertensive emergency 05/28/2020  . Weakness of right lower extremity 05/28/2020  . Allergic rhinitis 06/16/2018  . Chewing tobacco nicotine dependence with  nicotine-induced disorder 06/16/2018  . History of adenomatous polyp of colon 09/23/2017  . Hypercalcemia 04/20/2016  . Neck nodule  04/20/2016  . Essential hypertension, benign 07/21/2015  . Hyperlipidemia LDL goal <130 07/21/2015  . Abnormal fasting glucose 07/21/2015  . History of breast cancer in female 07/21/2015  . Major depressive disorder, recurrent, severe without psychotic features ([place]) 06/24/2015  . MDD (major depressive disorder), recurrent episode, severe ([place]) 06/24/2015  . Onychomycosis 06/12/2014  . Pain in lower limb 06/12/2014   Past Medical History:  Diagnosis Date  . Breast CA ([place]) 2006   right [facility])  . Cancer ([place])   . Helicobacter pylori gastritis 2001  . High blood pressure   . History of adenomatous polyp of colon 09/23/2017   [place] 09/25/2017 and apparently polyps in the past as well though recalled age  . History of shingles   . Major depressive disorder 05/28/2020  . Major depressive disorder, recurrent, severe without psychotic features ([place]) 06/24/2015  . Parotid mass 2014   right benign cyst  . Personal history of radiation therapy 2006   Rt breast  . Stroke (cerebrum) ([place]) 06/28/2020   Per patient  . Wears glasses    Family History  Problem Relation Age of Onset  . Heart disease Mother        MI  . Cancer Father        bone  . Thyroid disease Neg Hx   . Hypercalcemia Neg Hx   . Colon cancer Neg Hx   . Stomach cancer Neg Hx   . Rectal cancer Neg Hx   . Esophageal cancer Neg Hx    Past Surgical History:  Procedure Laterality Date  . ABDOMINAL HYSTERECTOMY  1982   BSO  . BREAST LUMPECTOMY Right 2007  . BREAST LUMPECTOMY WITH SENTINEL LYMPH NODE BIOPSY  2005   right  . BREAST SURGERY  2004   rt br mass  . CESAREAN SECTION    . COLONOSCOPY    . ESOPHAGOGASTRODUODENOSCOPY  2001   H pylori gastritis  . EYE SURGERY     both cataracts  . PAROTIDECTOMY Right 09/05/2013   Procedure: RIGHT SUPERFICIAL PAROTIDECTOMY WITH FACIAL NERVE  DISECTION;  Surgeon: [name], MD;  Location: [place] [facility];  Service: ENT;  Laterality: Right;  All documentation done by R.Ward after [phone] --done under G. Garzon's password  . RE-EXCISION OF BREAST LUMPECTOMY  2005   right   Social History   Occupational History  . Occupation: Middle school Scientist, research (physical sciences): [employer]  Tobacco Use  . Smoking status: Never Smoker  . Smokeless tobacco: Former Systems developer    Types: Secondary school teacher  . Vaping Use: Never used  Substance and Sexual Activity  . Alcohol use: Yes    Alcohol/week: 0.0 standard drinks    Comment: occ  . Drug use: No  . Sexual activity: Not Currently

## 2020-08-01 NOTE — Procedures (Signed)
Lumbosacral Transforaminal Epidural Steroid Injection - Sub-Pedicular Approach with Fluoroscopic Guidance  Patient: Erin Cunningham      Date of Birth: 06/28/1941 MRN: [id number] PCP: Lauree Chandler, NP      Visit Date: 08/01/2020   Universal Protocol:    Date/Time: 08/01/2020  Consent Given By: the patient  Position: PRONE  Additional Comments: Vital signs were monitored before and after the procedure. Patient was prepped and draped in the usual sterile fashion. The correct patient, procedure, and site was verified.   Injection Procedure Details:  Procedure Site One Meds Administered:  Meds ordered this encounter  Medications  . methylPREDNISolone acetate (DEPO-MEDROL) injection 80 mg    Laterality: Right  Location/Site:  L4-L5  Needle size: 22 G  Needle type: Spinal  Needle Placement: Transforaminal  Findings:    -Comments: Excellent flow of contrast along the nerve, nerve root and into the epidural space. Very exaggerated pain response with medication delivery. Needle movemnent into place without any pain.  I was actually surprised given the amount of anxiety the patient was having that the actual process of needle location was without difficulty at all.  Once the medication was being delivered along the nerve root however again very exaggerated response.  Very tearful the delivery of the medication lasted about 5 seconds.  We did deliver about 2 mL of fluid at that point.  Procedure Details: After squaring off the end-plates to get a true AP view, the C-arm was positioned so that an oblique view of the foramen as noted above was visualized. The target area is just inferior to the "nose of the scotty dog" or sub pedicular. The soft tissues overlying this structure were infiltrated with 2-3 ml. of 1% Lidocaine without Epinephrine.  The spinal needle was inserted toward the target using a "trajectory" view along the fluoroscope beam.  Under AP and lateral  visualization, the needle was advanced so it did not puncture dura and was located close the 6 O'Clock position of the pedical in AP tracterory. Biplanar projections were used to confirm position. Aspiration was confirmed to be negative for CSF and/or blood. A 1-2 ml. volume of Isovue-250 was injected and flow of contrast was noted at each level. Radiographs were obtained for documentation purposes.   After attaining the desired flow of contrast documented above, a 0.5 to 1.0 ml test dose of 0.25% Marcaine was injected into each respective transforaminal space.  The patient was observed for 90 seconds post injection.  After no sensory deficits were reported, and normal lower extremity motor function was noted,   the above injectate was administered so that equal amounts of the injectate were placed at each foramen (level) into the transforaminal epidural space.   Additional Comments:  No complications occurred Dressing: 2 x 2 sterile gauze and Band-Aid    Post-procedure details: Patient was observed during the procedure. Post-procedure instructions were reviewed.  Patient left the clinic in stable condition.

## 2020-08-02 ENCOUNTER — Telehealth: Payer: Self-pay | Admitting: Physical Medicine and Rehabilitation

## 2020-08-02 NOTE — Telephone Encounter (Signed)
Awesome, hope she continues to improve

## 2020-08-02 NOTE — Telephone Encounter (Signed)
Called to see how patient is doing Her husband states that she is doing much better. He states that once she started moving around her leg pain improved.

## 2020-08-07 ENCOUNTER — Telehealth: Payer: Self-pay | Admitting: Physical Medicine and Rehabilitation

## 2020-08-07 DIAGNOSIS — M5441 Lumbago with sciatica, right side: Secondary | ICD-10-CM

## 2020-08-07 DIAGNOSIS — M5416 Radiculopathy, lumbar region: Secondary | ICD-10-CM

## 2020-08-07 NOTE — Telephone Encounter (Signed)
Ok to order lumbar MRI?

## 2020-08-07 NOTE — Telephone Encounter (Signed)
Yes ok 

## 2020-08-07 NOTE — Telephone Encounter (Signed)
Patient called.   She said the pain is persisting in one area still so she would like to proceed with having the MRI done.   Call back: [phone]

## 2020-08-08 NOTE — Addendum Note (Signed)
Addended by: Sherre Scarlet B on: 08/08/2020 08:02 AM   Modules accepted: Orders

## 2020-08-08 NOTE — Telephone Encounter (Signed)
Order placed for MRI lumbar. Left message to notify patient.

## 2020-08-22 ENCOUNTER — Telehealth: Payer: Self-pay | Admitting: Physical Medicine and Rehabilitation

## 2020-08-22 NOTE — Telephone Encounter (Signed)
Called patient to advise that MRI was already ordered on 9/2. It looks like [facility] as tried to call her twice to schedule, but they have been unable to reach her or leave messages. I gave her the number to [facility] to call and schedule.

## 2020-08-22 NOTE — Telephone Encounter (Signed)
Patient called. She would like a MRI. Says her pain is a 7. Her call back number is [phone]

## 2020-08-23 ENCOUNTER — Telehealth: Payer: Self-pay

## 2020-08-23 ENCOUNTER — Ambulatory Visit: Payer: Self-pay | Admitting: Nurse Practitioner

## 2020-08-23 ENCOUNTER — Telehealth: Payer: Self-pay | Admitting: Nurse Practitioner

## 2020-08-23 NOTE — Telephone Encounter (Signed)
Patient was no show/no call. Called patient and left vm for the patient to call back to reschedule.
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
## 2020-08-23 NOTE — Telephone Encounter (Signed)
Message left on voicemail by patient requesting a return call.  Patient did not leave any details with the reason for her return call request  Left message on voicemail for patient to return call when available. I informed patient if she would like a quicker response best practice would be to speak with medical assistant in clinical intake

## 2020-08-23 NOTE — Telephone Encounter (Signed)
Spoke with patient, patient was leaving a meeting with her supervisor and she has decided to wait another week to see if things improve and if not she will call back for an appointment to get a letter    Appointment canceled for 3:45 pm

## 2020-08-23 NOTE — Telephone Encounter (Signed)
I have a verbal conversation with Erin Cunningham and she agreed that patient should be seen to further address concern and letter request. Per Lauree Chandler, NP visit can be in person, video, or telephone if patient does not have access to a smart phone, computer, or Internet.   Left message on voicemail for patient to return call when available. Reason for call to inform patient of Jessica's reply and ask her preference for visit scheduled today at 3:45 pm

## 2020-08-24 ENCOUNTER — Emergency Department (HOSPITAL_BASED_OUTPATIENT_CLINIC_OR_DEPARTMENT_OTHER): Payer: BC Managed Care – PPO

## 2020-08-24 ENCOUNTER — Emergency Department (HOSPITAL_BASED_OUTPATIENT_CLINIC_OR_DEPARTMENT_OTHER)
Admission: EM | Admit: 2020-08-24 | Discharge: 2020-08-24 | Disposition: A | Discharge status: Home / Self Care | Payer: No Typology Code available for payment source | Attending: Emergency Medicine | Admitting: Emergency Medicine

## 2020-08-24 ENCOUNTER — Encounter (HOSPITAL_BASED_OUTPATIENT_CLINIC_OR_DEPARTMENT_OTHER): Payer: Self-pay

## 2020-08-24 ENCOUNTER — Other Ambulatory Visit: Payer: Self-pay

## 2020-08-24 DIAGNOSIS — S63657A Sprain of metacarpophalangeal joint of left little finger, initial encounter: Secondary | ICD-10-CM

## 2020-08-24 DIAGNOSIS — Z79899 Other long term (current) drug therapy: Secondary | ICD-10-CM | POA: Insufficient documentation

## 2020-08-24 DIAGNOSIS — W228XXA Striking against or struck by other objects, initial encounter: Secondary | ICD-10-CM | POA: Insufficient documentation

## 2020-08-24 DIAGNOSIS — Z7982 Long term (current) use of aspirin: Secondary | ICD-10-CM | POA: Diagnosis not present

## 2020-08-24 DIAGNOSIS — Z853 Personal history of malignant neoplasm of breast: Secondary | ICD-10-CM | POA: Insufficient documentation

## 2020-08-24 DIAGNOSIS — I10 Essential (primary) hypertension: Secondary | ICD-10-CM | POA: Insufficient documentation

## 2020-08-24 DIAGNOSIS — S63655A Sprain of metacarpophalangeal joint of left ring finger, initial encounter: Secondary | ICD-10-CM | POA: Diagnosis not present

## 2020-08-24 DIAGNOSIS — S6992XA Unspecified injury of left wrist, hand and finger(s), initial encounter: Secondary | ICD-10-CM | POA: Diagnosis present

## 2020-08-24 MED ORDER — ACETAMINOPHEN 500 MG PO TABS: 1000.0000 mg | ORAL_TABLET | Freq: Four times a day (QID) | ORAL | 0 refills | Status: DC | PRN
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
## 2020-08-24 NOTE — Discharge Instructions (Addendum)
1.  Wear splint and buddy tape as applied in the emergency department.  You may remove and replace again to shower if needed.  You may also choose to cover your hand with a bag and leave your splint in place. 2.  Elevate the hand as much as possible and apply well wrapped ice pack for the next 2 days.  Apply for about 20 minutes every 2 hours. 3.  Make a follow-up appointment with your family doctor for recheck.  If you are having any worsening symptoms or other concerning symptoms you may need referral to a hand surgeon. 4.  Return to the emergency department if you have significantly increasing pain, swelling, redness or other concerning findings.

## 2020-08-24 NOTE — ED Triage Notes (Signed)
Pt arrives with c/o pain to left pinky finger

## 2020-08-28 ENCOUNTER — Telehealth (INDEPENDENT_AMBULATORY_CARE_PROVIDER_SITE_OTHER): Payer: BC Managed Care – PPO | Admitting: Nurse Practitioner

## 2020-08-28 ENCOUNTER — Other Ambulatory Visit: Payer: Self-pay

## 2020-08-28 DIAGNOSIS — S63657D Sprain of metacarpophalangeal joint of left little finger, subsequent encounter: Secondary | ICD-10-CM

## 2020-08-28 DIAGNOSIS — M79604 Pain in right leg: Secondary | ICD-10-CM | POA: Diagnosis not present

## 2020-08-28 NOTE — Progress Notes (Signed)
   This service is provided via telemedicine  No vital signs collected/recorded due to the encounter was a telemedicine visit.   Location of patient (ex: home, work):  Work, school   Patient consents to a telephone visit:  Yes, see telephone encounter dated 07/16/2020 with annual consent   Location of the provider (ex: office, home):  [facility], Office   Name of any referring provider:  n/a  Names of all persons participating in the telemedicine service and their role in the encounter:  S.Chrae B/CMA, Sherrie Mustache, NP, and Patient   Time spent on call:  7 min with medical assistant
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
## 2020-09-11 ENCOUNTER — Other Ambulatory Visit: Payer: Medicare Other

## 2020-09-14 ENCOUNTER — Ambulatory Visit
Admission: RE | Admit: 2020-09-14 | Discharge: 2020-09-14 | Disposition: A | Discharge status: Home / Self Care | Payer: BC Managed Care – PPO | Source: Ambulatory Visit | Attending: Physical Medicine and Rehabilitation | Admitting: Physical Medicine and Rehabilitation

## 2020-09-14 DIAGNOSIS — M5441 Lumbago with sciatica, right side: Secondary | ICD-10-CM

## 2020-09-14 DIAGNOSIS — M5416 Radiculopathy, lumbar region: Secondary | ICD-10-CM

## 2020-09-16 ENCOUNTER — Ambulatory Visit: Payer: Medicare Other | Admitting: Nurse Practitioner

## 2020-09-25 ENCOUNTER — Ambulatory Visit (INDEPENDENT_AMBULATORY_CARE_PROVIDER_SITE_OTHER): Payer: BC Managed Care – PPO | Admitting: Physical Medicine and Rehabilitation

## 2020-09-25 ENCOUNTER — Other Ambulatory Visit: Payer: Self-pay

## 2020-09-25 ENCOUNTER — Encounter: Payer: Self-pay | Admitting: Physical Medicine and Rehabilitation

## 2020-09-25 VITALS — BP 169/77 | HR 55

## 2020-09-25 DIAGNOSIS — M5416 Radiculopathy, lumbar region: Secondary | ICD-10-CM

## 2020-09-25 DIAGNOSIS — M4316 Spondylolisthesis, lumbar region: Secondary | ICD-10-CM | POA: Diagnosis not present

## 2020-09-25 DIAGNOSIS — M48062 Spinal stenosis, lumbar region with neurogenic claudication: Secondary | ICD-10-CM

## 2020-09-25 MED ORDER — PREGABALIN 75 MG PO CAPS
ORAL_CAPSULE | ORAL | 2 refills | Status: DC
Start: 2020-09-25 — End: 2020-12-09

## 2020-09-25 NOTE — Progress Notes (Signed)
Here for MRI review. Had about 85% relief from last injection. Still has some pain in right lateral thigh. Numeric Pain Rating Scale and Functional Assessment Average Pain 7   In the last MONTH (on 0-10 scale) has pain interfered with the following?  1. General activity like being  able to carry out your everyday physical activities such as walking, climbing stairs, carrying groceries, or moving a chair?  Rating(6)

## 2020-10-01 ENCOUNTER — Other Ambulatory Visit: Payer: Self-pay

## 2020-10-01 ENCOUNTER — Ambulatory Visit (INDEPENDENT_AMBULATORY_CARE_PROVIDER_SITE_OTHER): Payer: BC Managed Care – PPO | Admitting: Family

## 2020-10-01 ENCOUNTER — Encounter: Payer: Self-pay | Admitting: Family

## 2020-10-01 VITALS — BP 190/80 | HR 81 | Temp 98.2°F | Resp 16 | Ht 63.0 in | Wt 164.6 lb

## 2020-10-01 DIAGNOSIS — I10 Essential (primary) hypertension: Secondary | ICD-10-CM | POA: Diagnosis not present

## 2020-10-01 DIAGNOSIS — B359 Dermatophytosis, unspecified: Secondary | ICD-10-CM | POA: Diagnosis not present

## 2020-10-01 MED ORDER — TERBINAFINE HCL 1 % EX CREA: 1.0000 "application " | TOPICAL_CREAM | Freq: Two times a day (BID) | CUTANEOUS | 0 refills | Status: DC

## 2020-10-01 MED ORDER — FLUCONAZOLE 150 MG PO TABS: ORAL_TABLET | ORAL | 0 refills | Status: DC

## 2020-10-01 MED ORDER — FLUCONAZOLE 150 MG PO TABS: 150.0000 mg | ORAL_TABLET | Freq: Once | ORAL | 0 refills | Status: DC

## 2020-10-01 NOTE — Patient Instructions (Signed)
-   Check blood pressure once daily and record bring log to visit.Notify provider if B/p 140/90

## 2020-10-01 NOTE — Progress Notes (Signed)
Provider: Safiatou Islam FNP-C  Lauree Chandler, NP  Patient Care Team: Lauree Chandler, NP as PCP - General (Geriatric Medicine) [name], MD as Consulting Physician (Otolaryngology)  Extended Emergency Contact Information Primary Emergency Contact: Lauf,[name] Address: [address]          [place], [place] [address] [name] of [place] Phone: [phone] Mobile Phone: [phone] Relation: Spouse  Code Status:  Full code  Goals of care: Advanced Directive information Advanced Directives 10/01/2020  Does Patient Have a Medical Advance Directive? No  Would patient like information on creating a medical advance directive? No - Patient declined  Some encounter information is confidential and restricted. Go to Review Flowsheets activity to see all data.     Chief Complaint  Patient presents with   Acute Visit    Rash on Left Lower Leg/ Left Thigh, and Right Leg    HPI:  Pt is a 79 y.o. female seen today for an acute visit for evaluation of rash on left lower leg ,thigh and right leg.Has been working with kids and yard too.seh changed her body spray.No fever,chills or acute illness.Recently had pregabalin prescribed.Rash is dry and itching. Blood pressure is high today.States has not been taking her Hydrochlorothiazide,losartan and carvedilol. States forgot about them but takes the rest of her medication.  She denies any fever,chills,cough or acute illnesses    Past Medical History:  Diagnosis Date   Breast CA ([facility] [place]) 2006   right ([facility])   Cancer ([place])    Helicobacter pylori gastritis 2001   High blood pressure    History of adenomatous polyp of colon 09/23/2017   Oma 09/25/2017 and apparently polyps in the past as well though recalled age   History of shingles    Major depressive disorder 05/28/2020   Major depressive disorder, recurrent, severe without psychotic features ([place]) 06/24/2015   Parotid mass 2014   right benign  cyst   Personal history of radiation therapy 2006   Rt breast   Stroke (cerebrum) ([facility]) 06/28/2020   Per patient   Wears glasses    Past Surgical History:  Procedure Laterality Date   ABDOMINAL HYSTERECTOMY  1982   BSO   BREAST LUMPECTOMY Right 2007   BREAST LUMPECTOMY WITH SENTINEL LYMPH NODE BIOPSY  2005   right   BREAST SURGERY  2004   rt br mass   CESAREAN SECTION     COLONOSCOPY     ESOPHAGOGASTRODUODENOSCOPY  2001   H pylori gastritis   EYE SURGERY     both cataracts   PAROTIDECTOMY Right 09/05/2013   Procedure: RIGHT SUPERFICIAL PAROTIDECTOMY WITH FACIAL NERVE DISECTION;  Surgeon: [name], MD;  Location: [facility];  Service: ENT;  Laterality: Right;  All documentation done by R.Ward after [phone] --done under G. Garzon's password   RE-EXCISION OF BREAST LUMPECTOMY  2005   right    No Known Allergies  Outpatient Encounter Medications as of 10/01/2020  Medication Sig   acetaminophen (TYLENOL) 500 MG tablet Take 2 tablets (1,000 mg total) by mouth every 6 (six) hours as needed.   aspirin EC 81 MG EC tablet Take 1 tablet (81 mg total) by mouth daily. Swallow whole.   atorvastatin (LIPITOR) 80 MG tablet Take 1 tablet (80 mg total) by mouth daily.   carvedilol (COREG) 6.25 MG tablet Take 6.25 mg by mouth daily.   Cholecalciferol (VITAMIN D) 50 MCG (2000 UT) tablet Take 2,000 Units by mouth daily.   HYDROcodone-acetaminophen (NORCO/VICODIN) 5-325  MG tablet Take 1 tablet by mouth every 6 (six) hours as needed for moderate pain.   pregabalin (LYRICA) 75 MG capsule Take 1 capsule (75 mg total) by mouth daily for 7 days, THEN 1 capsule (75 mg total) 2 (two) times daily for 23 days.   sertraline (ZOLOFT) 50 MG tablet Take 50 mg by mouth daily.   traZODone (DESYREL) 50 MG tablet Take 1 tablet (50 mg total) by mouth at bedtime as needed for sleep.   hydrochlorothiazide (HYDRODIURIL) 12.5 MG tablet Take 1 tablet (12.5 mg total) by mouth  daily.   losartan (COZAAR) 100 MG tablet Take 1 tablet (100 mg total) by mouth daily.   meloxicam (MOBIC) 15 MG tablet Take 1 tablet (15 mg total) by mouth daily.   [DISCONTINUED] carvedilol (COREG) 6.25 MG tablet Take 1 tablet (6.25 mg total) by mouth 2 (two) times daily with a meal.   [DISCONTINUED] diazepam (VALIUM) 5 MG tablet Take 1 by mouth 1 hour  pre-procedure with very light food. May bring 2nd tablet to appointment.   No facility-administered encounter medications on file as of 10/01/2020.    Review of Systems  Constitutional: Negative for appetite change, fatigue and fever.  HENT: Negative for congestion, rhinorrhea, sinus pressure, sinus pain, sneezing and sore throat.   Eyes: Negative for discharge, redness and itching.  Respiratory: Negative for cough, chest tightness, shortness of breath and wheezing.   Cardiovascular: Negative for chest pain, palpitations and leg swelling.  Gastrointestinal: Negative for abdominal distention, abdominal pain, constipation, diarrhea, nausea and vomiting.  Genitourinary: Negative for difficulty urinating, dysuria, flank pain, frequency, hematuria and urgency.  Musculoskeletal: Negative for arthralgias, gait problem, joint swelling and myalgias.  Skin: Positive for rash. Negative for color change, pallor and wound.  Neurological: Negative for dizziness, speech difficulty, weakness, light-headedness, numbness and headaches.  Psychiatric/Behavioral: Negative for agitation, behavioral problems, hallucinations and sleep disturbance. The patient is not nervous/anxious.        Forgetful     Immunization History  Administered Date(s) Administered   Fluad Quad(high Dose 65+) 01/15/2020   Influenza, High Dose Seasonal PF 09/14/2018   Influenza,inj,Quad PF,6+ Mos 11/06/2015   PFIZER SARS-COV-2 Vaccination 02/03/2020, 02/24/2020   PPD Test 12/10/2015   Pneumococcal Conjugate-13 01/16/2015   Pneumococcal Polysaccharide-23 05/12/2017    Tdap 06/15/2018   Pertinent  Health Maintenance Due  Topic Date Due   INFLUENZA VACCINE  07/07/2020   DEXA SCAN  Completed   Fall Risk  10/01/2020 07/16/2020 06/28/2020 01/15/2020 07/10/2019  Falls in the past year? 0 0 0 0 1  Number falls in past yr: 0 0 0 0 0  Injury with Fall? 0 0 0 0 1   Functional Status Survey:    Vitals:   10/01/20 1456  BP: (!) 190/80  Pulse: 81  Resp: 16  Temp: 98.2 F (36.8 C)  SpO2: 98%  Weight: 164 lb 9.6 oz (74.7 kg)  Height: 5\' 3"  (1.6 m)   Body mass index is 29.16 kg/m. Physical Exam Constitutional:      General: She is not in acute distress.    Appearance: She is overweight. She is not ill-appearing.  Eyes:     General: No scleral icterus.       Right eye: No discharge.        Left eye: No discharge.     Conjunctiva/sclera: Conjunctivae normal.     Pupils: Pupils are equal, round, and reactive to light.  Cardiovascular:     Rate and Rhythm: Normal  rate and regular rhythm.     Pulses: Normal pulses.     Heart sounds: Normal heart sounds. No murmur heard.  No friction rub. No gallop.   Pulmonary:     Effort: Pulmonary effort is normal. No respiratory distress.     Breath sounds: Normal breath sounds. No wheezing, rhonchi or rales.  Chest:     Chest wall: No tenderness.  Abdominal:     General: Bowel sounds are normal. There is no distension.     Palpations: Abdomen is soft. There is no mass.     Tenderness: There is no abdominal tenderness. There is no right CVA tenderness, left CVA tenderness, guarding or rebound.  Skin:    General: Skin is warm and dry.     Coloration: Skin is not pale.     Findings: No erythema or lesion.     Comments: Circular dry rash without any erythema noted on anterior and posterior right thigh,left lateral leg X 2 areas.both  feet with dry itching scaly peeling skin   Neurological:     Mental Status: She is alert and oriented to person, place, and time.     Cranial Nerves: No cranial nerve deficit.      Sensory: No sensory deficit.     Motor: No weakness.     Gait: Gait normal.  Psychiatric:        Mood and Affect: Mood normal.        Behavior: Behavior normal.        Thought Content: Thought content normal.        Judgment: Judgment normal.    Labs reviewed: Recent Labs    05/28/20 1201 06/28/20 1331 07/10/20 1019  NA 139 139 141  K 4.0 4.0 4.1  CL 105 108 107  CO2 26 26 28   GLUCOSE 102* 116* 98  BUN 15 20 13   CREATININE 0.86 1.05* 0.89  CALCIUM 10.2 10.8* 10.8*   Recent Labs    01/15/20 1034 07/10/20 1019  AST 13 15  ALT 7 10  BILITOT 0.5 0.4  PROT 6.5 6.7   Recent Labs    01/15/20 1034 05/28/20 1201 07/10/20 1019  WBC 5.7 5.9 6.8  NEUTROABS 2,879 3.0 3,556  HGB 13.0 14.4 13.2  HCT 39.0 45.3 40.8  MCV 86.3 90.1 88.5  PLT 311 322 289   Lab Results  Component Value Date   TSH 1.09 06/13/2018   Lab Results  Component Value Date   HGBA1C 5.6 05/29/2020   Lab Results  Component Value Date   CHOL 119 07/10/2020   HDL 43 (L) 07/10/2020   LDLCALC 58 07/10/2020   TRIG 95 07/10/2020   CHOLHDL 2.8 07/10/2020    Significant Diagnostic Results in last 30 days:  MR LUMBAR SPINE WO CONTRAST  Result Date: 09/15/2020 CLINICAL DATA:  Low back pain radiating down the right leg. EXAM: MRI LUMBAR SPINE WITHOUT CONTRAST TECHNIQUE: Multiplanar, multisequence MR imaging of the lumbar spine was performed. No intravenous contrast was administered. COMPARISON:  Radiography 07/23/2020 FINDINGS: Segmentation:  5 lumbar type vertebral bodies. Alignment:  7 mm anterolisthesis L4-5. 2 mm anterolisthesis L3-4. Vertebrae:  No fracture or primary bone lesion. Conus medullaris and cauda equina: Conus extends to the L1-2 level. Conus and cauda equina appear normal. Paraspinal and other soft tissues: Negative Disc levels: No significant finding at L2-3 or above. Minimal age related desiccation of the discs. No stenosis. L3-4: Mild bulging of the disc. Facet degeneration and  hypertrophy. 2 mm of degenerative  anterolisthesis. Mild canal narrowing but without visible neural compression. L4-5: Facet arthropathy with 7 mm of anterolisthesis. Bulging of the disc. Severe stenosis at this level that could cause neural compression on either or both sides. Bilateral foraminal narrowing as well. L5-S1: Mild bulging of the disc. Mild facet and ligamentous hypertrophy. Mild narrowing of the subarticular lateral recesses but without visible neural compression. IMPRESSION: L4-5: Severe multifactorial spinal stenosis. Advanced facet arthropathy with facet and ligamentous hypertrophy allowing anterolisthesis of 7 mm. Bulging of the disc. Bilateral foraminal narrowing. Neural compression could occur on either or both sides. L3-4: Facet degeneration and hypertrophy. 2 mm of anterolisthesis. Bulging of the disc. Mild canal narrowing without visible neural compression. L5-S1: Mild disc bulge and facet hypertrophy. No compressive stenosis. Electronically Signed   By: Nelson Chimes M.D.   On: 09/15/2020 05:42    Assessment/Plan 1. Tinea Afebrile.Both feet with itching,peeling scaly dry skin consistent with tinea.ongoing for several months.no erythema noted.wearing shoes without any socks.I've discussed with her to wear soaks if possible cotton with wool for sweat absorption. - Advised to cleanse all her shoes.  - consider use of antifungal powder to shoes if symptoms persist on feet. Advised to keep feet dry.  Apply terbinafine cream twice daily until peeling resolve.  -will also treat with diflucan as below due to extensive spread. - terbinafine (LAMISIL) 1 % cream; Apply 1 application topically 2 (two) times daily. To affected areas  Dispense: 30 g; Refill: 0 - fluconazole (DIFLUCAN) 150 MG tablet; Take one tablet by mouth once weekly x 4 weeks.  Dispense: 4 tablet; Refill: 0  2. Ringworm Several rash areas noted on right thigh anterior and posterior  and left lateral leg x 2  - advised to  prevent scratching patch site to prevent further transmission under her long nails.  -  fluconazole (DIFLUCAN) 150 MG tablet; Take one tablet by mouth once weekly x 4 weeks.  Dispense: 4 tablet; Refill: 0 - will refer to dermatologist if symptoms worsen.  3. Uncontrolled hypertension B/p elevated this visit though states has not been taking medication as directed.Only took lisinopril .Has medication but states forgot to take the rest of the medication.  Advised to resume her blood pressure medication.has written on the AVS which medication are for high blood pressure. -B/p log given and advised to check B/p daily and record then bring log to next visit.   Family/ staff Communication: Reviewed plan of care with patient  Labs/tests ordered: 4 weeks for blood and rash evaluation.   Next Appointment: 4 weeks follow up for rash and blood pressure.   Sandrea Hughs, NP

## 2020-10-21 NOTE — Progress Notes (Addendum)
NEUROLOGY CONSULTATION NOTE  Erin Cunningham MRN: [id number] DOB: 06-21-1941  Referring provider: Sherrie Mustache, NP Primary care provider: Sherrie Mustache, NP  Reason for consult:  stroke   Subjective:  Erin Cunningham is a 79 year old right-handed female with HTN and history of breast cancer who presents for stroke.  History supplemented by hospital records and referring provider's notes.  CT head, CTA of head ane neck and MRI of brain personally reviewed.  She was admitted to [facility] on 05/28/2020 for one week history of right leg weakness.  On day of admission, she was at work and didn't feel well.  When she stood up, she started falling over and couldn't walk.  Blood pressure on arrival was 226/95.  CT head was negative for acute abnormality but MRI of brain showed a left basal ganglia infarct.  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  CTA of head and neck showed aortic atherosclerosis but no large vessel occlusion or high-grade stenosis.  2D echo showed EF 65-70% with no source of embolus.  LDL was 145 and Hgb A1c was 5.6.  She was not on any antithrombotics prior to admission and was discharged on ASA 81mg  and Plavix 75mg  daily for 3 weeks followed by ASA 81mg  daily alone.  She was not on statin prior the admission and was discharged on Lipitor 80mg  daily.  Repeat LDL from 07/10/2020 was 58.  Blood pressure has remained elevated.  She reports that she has only been taking one of her three antihypertensive medications because she thought 3 medications were too much.       PAST MEDICAL HISTORY: Past Medical History:  Diagnosis Date  . Breast CA ([place] [facility]) 2006   right [facility])  . Cancer ([facility])   . Helicobacter pylori gastritis 2001  . High blood pressure   . History of adenomatous polyp of colon 09/23/2017   Oma 09/25/2017 and apparently polyps in the past as well though recalled age  . History of shingles   . Major depressive disorder 05/28/2020  . Major  depressive disorder, recurrent, severe without psychotic features ([place]) 06/24/2015  . Parotid mass 2014   right benign cyst  . Personal history of radiation therapy 2006   Rt breast  . Stroke (cerebrum) ([facility]) 06/28/2020   Per patient  . Wears glasses     PAST SURGICAL HISTORY: Past Surgical History:  Procedure Laterality Date  . ABDOMINAL HYSTERECTOMY  1982   BSO  . BREAST LUMPECTOMY Right 2007  . BREAST LUMPECTOMY WITH SENTINEL LYMPH NODE BIOPSY  2005   right  . BREAST SURGERY  2004   rt br mass  . CESAREAN SECTION    . COLONOSCOPY    . ESOPHAGOGASTRODUODENOSCOPY  2001   H pylori gastritis  . EYE SURGERY     both cataracts  . PAROTIDECTOMY Right 09/05/2013   Procedure: RIGHT SUPERFICIAL PAROTIDECTOMY WITH FACIAL NERVE DISECTION;  Surgeon: [name], MD;  Location: [facility];  Service: ENT;  Laterality: Right;  All documentation done by R.Ward after [phone] --done under G. Garzon's password  . RE-EXCISION OF BREAST LUMPECTOMY  2005   right    MEDICATIONS: Current Outpatient Medications on File Prior to Visit  Medication Sig Dispense Refill  . acetaminophen (TYLENOL) 500 MG tablet Take 2 tablets (1,000 mg total) by mouth every 6 (six) hours as needed. 30 tablet 0  . aspirin EC 81 MG EC tablet Take 1 tablet (81 mg total) by  mouth daily. Swallow whole. 90 tablet 1  . atorvastatin (LIPITOR) 80 MG tablet Take 1 tablet (80 mg total) by mouth daily. 90 tablet 1  . carvedilol (COREG) 6.25 MG tablet Take 6.25 mg by mouth daily.    . Cholecalciferol (VITAMIN D) 50 MCG (2000 UT) tablet Take 2,000 Units by mouth daily.    . fluconazole (DIFLUCAN) 150 MG tablet Take one tablet by mouth once weekly x 4 weeks. 4 tablet 0  . hydrochlorothiazide (HYDRODIURIL) 12.5 MG tablet Take 1 tablet (12.5 mg total) by mouth daily. 90 tablet 1  . HYDROcodone-acetaminophen (NORCO/VICODIN) 5-325 MG tablet Take 1 tablet by mouth every 6 (six) hours as needed for moderate pain. 30  tablet 0  . losartan (COZAAR) 100 MG tablet Take 1 tablet (100 mg total) by mouth daily. 90 tablet 3  . meloxicam (MOBIC) 15 MG tablet Take 1 tablet (15 mg total) by mouth daily. 30 tablet 0  . pregabalin (LYRICA) 75 MG capsule Take 1 capsule (75 mg total) by mouth daily for 7 days, THEN 1 capsule (75 mg total) 2 (two) times daily for 23 days. 53 capsule 2  . sertraline (ZOLOFT) 50 MG tablet Take 50 mg by mouth daily.    [place] Kitchen terbinafine (LAMISIL) 1 % cream Apply 1 application topically 2 (two) times daily. To affected areas 30 g 0  . traZODone (DESYREL) 50 MG tablet Take 1 tablet (50 mg total) by mouth at bedtime as needed for sleep. 20 tablet 0   No current facility-administered medications on file prior to visit.    ALLERGIES: No Known Allergies  FAMILY HISTORY: Family History  Problem Relation Age of Onset  . Heart disease Mother        MI  . Cancer Father        bone  . Thyroid disease Neg Hx   . Hypercalcemia Neg Hx   . Colon cancer Neg Hx   . Stomach cancer Neg Hx   . Rectal cancer Neg Hx   . Esophageal cancer Neg Hx     SOCIAL HISTORY: Social History   Socioeconomic History  . Marital status: Married    Spouse name: Not on file  . Number of children: 3  . Years of education: At least some college  . Highest education level: Not on file  Occupational History  . Occupation: Middle school Scientist, research (physical sciences): [employer]  Tobacco Use  . Smoking status: Never Smoker  . Smokeless tobacco: Former Systems developer    Types: Secondary school teacher  . Vaping Use: Never used  Substance and Sexual Activity  . Alcohol use: Not Currently    Alcohol/week: 0.0 standard drinks    Comment: occ  . Drug use: No  . Sexual activity: Not Currently  Other Topics Concern  . 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 Difficulty of Paying Living Expenses: Not on file  Food Insecurity:   . Worried About Charity fundraiser in the Last Year: Not on file  . Ran Out of Food in the Last Year: Not on file  Transportation  Needs:   . Lack of Transportation (Medical): Not on file  . Lack of Transportation (Non-Medical): Not on file  Physical Activity:   . Days of Exercise per Week: Not on file  . Minutes of Exercise per Session: Not on file  Stress:   . Feeling of Stress : Not on file  Social Connections:   . Frequency of Communication with Friends and Family: Not on file  . Frequency of Social Gatherings with Friends and Family: Not on file  . Attends Religious Services: Not on file  . Active Member of Clubs or Organizations: Not on file  . Attends Archivist Meetings: Not on file  . Marital Status: Not on file  Intimate Partner Violence:   . Fear of Current or Ex-Partner: Not on file  . Emotionally Abused: Not on file  . Physically Abused: Not on file  . Sexually Abused: Not on file    Objective:  Blood pressure (!) 176/82, pulse 87, height 5' (1.524 m), weight 162 lb 3.2 oz (73.6 kg), SpO2 99 %. General: No acute distress.  Patient appears well-groomed.   Head:  Normocephalic/atraumatic Eyes:  fundi examined but not visualized Neck: supple, no paraspinal tenderness, full range of motion Back: No paraspinal tenderness Heart: regular rate and rhythm Lungs: Clear to auscultation bilaterally. Vascular: No carotid bruits. Neurological Exam: Mental status: alert and oriented to person, place, and time, recent and remote memory intact, fund of knowledge intact, attention and concentration intact, speech fluent and not dysarthric, language intact. Cranial nerves: CN I: not tested CN II: pupils equal, round and reactive to  light, visual fields intact CN III, IV, VI:  full range of motion, no nystagmus, bilateral ptosis CN V: facial sensation intact. CN VII: upper and lower face symmetric CN VIII: hearing intact CN IX, X: gag intact, uvula midline CN XI: sternocleidomastoid and trapezius muscles intact CN XII: tongue midline Bulk & Tone: normal, no fasciculations. Motor:  muscle strength 5-/5 right hip flexion limited by pain; otherwise 5/5 throughout Sensation:  Pinprick, temperature and vibratory sensation intact. Deep Tendon Reflexes:  2+ throughout,  toes downgoing.   Finger to nose testing:  Without dysmetria.   Heel to shin:  Without dysmetria.   Gait:  Normal station and stride.  Able to turn and tandem walk.  Romberg negative.  Assessment/Plan:   1.  Left basal ganglia infarct secondary to small vessel disease 2.  Hypertension, uncontrolled 3.  Hyperlipidemia  1.  Secondary stroke prevention as managed by PCP: -  ASA 81mg  da ily -  Lipitor 80mg  daily.  LDL goal less than 70 -  Blood pressure control.  I told her that she must take all of her blood pressure medications as prescribed because her blood pressure is too elevated which will increase her risk for a recurrent stroke. -  Glycemic control Hgb A1c goal less than 7 -  Mediterranean diet -  Routine exercise 2.  Follow up in 6 months.    Thank you for allowing me to take part in the care of this patient.  Metta Clines, DO  CC: Sherrie Mustache, NP

## 2020-10-22 ENCOUNTER — Other Ambulatory Visit: Payer: Self-pay

## 2020-10-22 ENCOUNTER — Ambulatory Visit (INDEPENDENT_AMBULATORY_CARE_PROVIDER_SITE_OTHER): Payer: BC Managed Care – PPO | Admitting: Neurology

## 2020-10-22 ENCOUNTER — Encounter: Payer: Self-pay | Admitting: Neurology

## 2020-10-22 VITALS — BP 170/83 | HR 87 | Ht 60.0 in | Wt 162.2 lb

## 2020-10-22 DIAGNOSIS — I10 Essential (primary) hypertension: Secondary | ICD-10-CM

## 2020-10-22 DIAGNOSIS — E785 Hyperlipidemia, unspecified: Secondary | ICD-10-CM | POA: Diagnosis not present

## 2020-10-22 DIAGNOSIS — I639 Cerebral infarction, unspecified: Secondary | ICD-10-CM | POA: Diagnosis not present

## 2020-10-22 NOTE — Patient Instructions (Signed)
1.  Take aspirin 81mg  daily 2.  Take atorvastatin 80mg  daily 3.  You must take all three blood pressure medications as prescribed by your provider. 4.  Follow a Mediterranean diet (see below) 5.  Follow up in 6 months   Mediterranean Diet A Mediterranean diet refers to food and lifestyle choices that are based on the traditions of countries located on the [employer]. This way of eating has been shown to help prevent certain conditions and improve outcomes for people who have chronic diseases, like kidney disease and heart disease. What are tips for following this plan? Lifestyle  Cook and eat meals together with your family, when possible.  Drink enough fluid to keep your urine clear or pale yellow.  Be physically active every day. This includes: ? Aerobic exercise like running or swimming. ? Leisure activities like gardening, walking, or housework.  Get 7-8 hours of sleep each night.  If recommended by your health care provider, drink red wine in moderation. This means 1 glass a day for nonpregnant women and 2 glasses a day for men. A glass of wine equals 5 oz (150 mL). Reading food labels   Check the serving size of packaged foods. For foods such as rice and pasta, the serving size refers to the amount of cooked product, not dry.  Check the total fat in packaged foods. Avoid foods that have saturated fat or trans fats.  Check the ingredients list for added sugars, such as corn syrup. Shopping  At the grocery store, buy most of your food from the areas near the walls of the store. This includes: ? Fresh fruits and vegetables (produce). ? Grains, beans, nuts, and seeds. Some of these may be available in unpackaged forms or large amounts (in bulk). ? Fresh seafood. ? Poultry and eggs. ? Low-fat dairy products.  Buy whole ingredients instead of prepackaged foods.  Buy fresh fruits and vegetables in-season from local farmers markets.  Buy frozen fruits and vegetables  in resealable bags.  If you do not have access to quality fresh seafood, buy precooked frozen shrimp or canned fish, such as tuna, salmon, or sardines.  Buy small amounts of raw or cooked vegetables, salads, or olives from the deli or salad bar at your store.  Stock your pantry so you always have certain foods on hand, such as olive oil, canned tuna, canned tomatoes, rice, pasta, and beans. Cooking  Cook foods with extra-virgin olive oil instead of using butter or other vegetable oils.  Have meat as a side dish, and have vegetables or grains as your main dish. This means having meat in small portions or adding small amounts of meat to foods like pasta or stew.  Use beans or vegetables instead of meat in common dishes like chili or lasagna.  Experiment with different cooking methods. Try roasting or broiling vegetables instead of steaming or sauteing them.  Add frozen vegetables to soups, stews, pasta, or rice.  Add nuts or seeds for added healthy fat at each meal. You can add these to yogurt, salads, or vegetable dishes.  Marinate fish or vegetables using olive oil, lemon juice, garlic, and fresh herbs. Meal planning   Plan to eat 1 vegetarian meal one day each week. Try to work up to 2 vegetarian meals, if possible.  Eat seafood 2 or more times a week.  Have healthy snacks readily available, such as: ? Vegetable sticks with hummus. ? [place] yogurt. ? Fruit and nut trail mix.  Eat balanced meals  throughout the week. This includes: ? Fruit: 2-3 servings a day ? Vegetables: 4-5 servings a day ? Low-fat dairy: 2 servings a day ? Fish, poultry, or lean meat: 1 serving a day ? Beans and legumes: 2 or more servings a week ? Nuts and seeds: 1-2 servings a day ? Whole grains: 6-8 servings a day ? Extra-virgin olive oil: 3-4 servings a day  Limit red meat and sweets to only a few servings a month What are my food choices?  Mediterranean diet ? Recommended  Grains: Whole-grain  pasta. Brown rice. Bulgar wheat. Polenta. Couscous. Whole-wheat bread. [place].  Vegetables: Artichokes. Beets. Broccoli. Cabbage. Carrots. Eggplant. Green beans. Chard. Kale. Spinach. Onions. Leeks. Peas. Squash. Tomatoes. Peppers. Radishes.  Fruits: Apples. Apricots. Avocado. Berries. Bananas. Cherries. Dates. Figs. Grapes. Lemons. Melon. Oranges. Peaches. Plums. Pomegranate.  Meats and other protein foods: Beans. Almonds. Sunflower seeds. Pine nuts. Peanuts. [place]. Salmon. Scallops. Shrimp. [place]. Tilapia. Clams. Oysters. Eggs.  Dairy: Low-fat milk. Cheese. Greek yogurt.  Beverages: Water. Red wine. Herbal tea.  Fats and oils: Extra virgin olive oil. Avocado oil. Grape seed oil.  Sweets and desserts: [place] yogurt with honey. Baked apples. Poached pears. Trail mix.  Seasoning and other foods: Basil. Cilantro. Coriander. Cumin. Mint. Parsley. Sage. Rosemary. Tarragon. Garlic. Oregano. Thyme. Pepper. Balsalmic vinegar. Tahini. Hummus. Tomato sauce. Olives. Mushrooms. ? Limit these  Grains: Prepackaged pasta or rice dishes. Prepackaged cereal with added sugar.  Vegetables: Deep fried potatoes (french fries).  Fruits: Fruit canned in syrup.  Meats and other protein foods: Beef. Pork. Lamb. Poultry with skin. Hot dogs. Berniece Salines.  Dairy: Ice cream. Sour cream. Whole milk.  Beverages: Juice. Sugar-sweetened soft drinks. Beer. Liquor and spirits.  Fats and oils: Butter. Canola oil. Vegetable oil. Beef fat (tallow). Lard.  Sweets and desserts: Cookies. Cakes. Pies. Candy.  Seasoning and other foods: Mayonnaise. Premade sauces and marinades. The items listed may not be a complete list. Talk with your dietitian about what dietary choices are right for you. Summary  The Mediterranean diet includes both food and lifestyle choices.  Eat a variety of fresh fruits and vegetables, beans, nuts, seeds, and whole grains.  Limit the amount of red meat and sweets that you eat.  Talk with  your health care provider about whether it is safe for you to drink red wine in moderation. This means 1 glass a day for nonpregnant women and 2 glasses a day for men. A glass of wine equals 5 oz (150 mL). This information is not intended to replace advice given to you by your health care provider. Make sure you discuss any questions you have with your health care provider. Document Revised: 07/23/2016 Document Reviewed: 07/16/2016 Elsevier Patient Education  [place].

## 2020-10-29 ENCOUNTER — Ambulatory Visit (INDEPENDENT_AMBULATORY_CARE_PROVIDER_SITE_OTHER): Payer: BC Managed Care – PPO | Admitting: Family

## 2020-10-29 ENCOUNTER — Other Ambulatory Visit: Payer: Self-pay

## 2020-10-29 ENCOUNTER — Encounter: Payer: Self-pay | Admitting: Family

## 2020-10-29 VITALS — BP 110/60 | HR 60 | Temp 97.5°F | Resp 16 | Ht 60.0 in | Wt 160.2 lb

## 2020-10-29 DIAGNOSIS — I10 Essential (primary) hypertension: Secondary | ICD-10-CM

## 2020-10-29 DIAGNOSIS — B359 Dermatophytosis, unspecified: Secondary | ICD-10-CM

## 2020-10-29 NOTE — Patient Instructions (Signed)
-   complete Remaining Diflucan tablet. - Notify provider if rash returns. - continue to take blood pressure medication as directed.

## 2020-10-29 NOTE — Progress Notes (Signed)
Provider: Keiyon Plack FNP-C  Lauree Chandler, NP  Patient Care Team: Lauree Chandler, NP as PCP - General (Geriatric Medicine) [name], MD as Consulting Physician (Otolaryngology)  Extended Emergency Contact Information Primary Emergency Contact: Brimley,[name] Address: [address]          [place], [place] [address] [name] of [place] Phone: [phone] Mobile Phone: [phone] Relation: Spouse Secondary Emergency Contact: Bobbye Morton Mobile Phone: [phone] Relation: Son  Code Status:  DNR Goals of care: Advanced Directive information Advanced Directives 10/29/2020  Does Patient Have a Medical Advance Directive? No  Would patient like information on creating a medical advance directive? No - Patient declined  Some encounter information is confidential and restricted. Go to Review Flowsheets activity to see all data.     Chief Complaint  Patient presents with  . Medical Management of Chronic Issues    4 week follow up.    HPI:  Pt is a 79 y.o. female seen today for an acute visit for 4 weeks follow up for high blood pressure and rash.she was here 10/01/2020 treated for Ringworm on right thigh and posterior left lateral leg x 2 and tinea on feet.Fluconazole 150 mg tablet weekly x 4 weeks was ordered.Terbinafine cream 1% was also ordered to apply to affected areas on feet.she states peeling has improved and rash resolved.  She was not taking her B/p medication as directed on previous visit took only lisinopril.she has been taking her medication as directed.she forgot to bring her blood pressure log.states didn't checked. B/p today is within normal.she denies any signs of hypotension.   Past Medical History:  Diagnosis Date  . Breast CA ([place]) 2006   right [facility])  . Cancer ([place])   . Helicobacter pylori gastritis 2001  . High blood pressure   . History of adenomatous polyp of colon 09/23/2017   [place] 09/25/2017 and  apparently polyps in the past as well though recalled age  . History of shingles   . Major depressive disorder 05/28/2020  . Major depressive disorder, recurrent, severe without psychotic features ([place]) 06/24/2015  . Parotid mass 2014   right benign cyst  . Personal history of radiation therapy 2006   Rt breast  . Stroke (cerebrum) ([place]) 06/28/2020   Per patient  . Wears glasses    Past Surgical History:  Procedure Laterality Date  . ABDOMINAL HYSTERECTOMY  1982   BSO  . BREAST LUMPECTOMY Right 2007  . BREAST LUMPECTOMY WITH SENTINEL LYMPH NODE BIOPSY  2005   right  . BREAST SURGERY  2004   rt br mass  . CESAREAN SECTION    . COLONOSCOPY    . ESOPHAGOGASTRODUODENOSCOPY  2001   H pylori gastritis  . EYE SURGERY     both cataracts  . PAROTIDECTOMY Right 09/05/2013   Procedure: RIGHT SUPERFICIAL PAROTIDECTOMY WITH FACIAL NERVE DISECTION;  Surgeon: [name], MD;  Location: [facility];  Service: ENT;  Laterality: Right;  All documentation done by R.Ward after [phone] --done under G. Garzon's password  . RE-EXCISION OF BREAST LUMPECTOMY  2005   right    No Known Allergies  Outpatient Encounter Medications as of 10/29/2020  Medication Sig  . acetaminophen (TYLENOL) 500 MG tablet Take 2 tablets (1,000 mg total) by mouth every 6 (six) hours as needed.  Marland Kitchen aspirin EC 81 MG EC tablet Take 1 tablet (81 mg total) by mouth daily. Swallow whole.  Marland Kitchen atorvastatin (LIPITOR) 80 MG tablet Take 1 tablet (80  mg total) by mouth daily.  . carvedilol (COREG) 6.25 MG tablet Take 6.25 mg by mouth daily.  . Cholecalciferol (VITAMIN D) 50 MCG (2000 UT) tablet Take 2,000 Units by mouth daily.  . fluconazole (DIFLUCAN) 150 MG tablet Take one tablet by mouth once weekly x 4 weeks.  . hydrochlorothiazide (HYDRODIURIL) 12.5 MG tablet Take 1 tablet (12.5 mg total) by mouth daily.  Marland Kitchen HYDROcodone-acetaminophen (NORCO/VICODIN) 5-325 MG tablet Take 1 tablet by mouth every 6 (six) hours as  needed for moderate pain.  Marland Kitchen losartan (COZAAR) 100 MG tablet Take 1 tablet (100 mg total) by mouth daily.  . meloxicam (MOBIC) 15 MG tablet Take 1 tablet (15 mg total) by mouth daily.  . sertraline (ZOLOFT) 50 MG tablet Take 50 mg by mouth daily.  Marland Kitchen terbinafine (LAMISIL) 1 % cream Apply 1 application topically 2 (two) times daily. To affected areas  . traZODone (DESYREL) 50 MG tablet Take 1 tablet (50 mg total) by mouth at bedtime as needed for sleep.  . pregabalin (LYRICA) 75 MG capsule Take 1 capsule (75 mg total) by mouth daily for 7 days, THEN 1 capsule (75 mg total) 2 (two) times daily for 23 days.   No facility-administered encounter medications on file as of 10/29/2020.    Review of Systems  Constitutional: Negative for appetite change, chills, fatigue and fever.  HENT: Negative for congestion, rhinorrhea, sinus pressure, sinus pain, sneezing and sore throat.   Eyes: Negative for pain, discharge, redness and itching.  Respiratory: Negative for cough, chest tightness, shortness of breath and wheezing.   Cardiovascular: Negative for chest pain, palpitations and leg swelling.  Gastrointestinal: Negative for abdominal distention, abdominal pain, constipation, diarrhea and nausea.  Genitourinary: Negative for difficulty urinating, dysuria, flank pain, frequency and urgency.  Musculoskeletal: Negative for arthralgias, gait problem and joint swelling.  Skin: Negative for color change, pallor and rash.  Neurological: Negative for dizziness, speech difficulty, weakness, light-headedness, numbness and headaches.  Hematological: Does not bruise/bleed easily.  Psychiatric/Behavioral: Negative for agitation, behavioral problems and sleep disturbance. The patient is not nervous/anxious.     Immunization History  Administered Date(s) Administered  . Fluad Quad(high Dose 65+) 01/15/2020  . Influenza, High Dose Seasonal PF 09/14/2018  . Influenza,inj,Quad PF,6+ Mos 11/06/2015  . PFIZER  SARS-COV-2 Vaccination 02/03/2020, 02/24/2020  . PPD Test 12/10/2015  . Pneumococcal Conjugate-13 01/16/2015  . Pneumococcal Polysaccharide-23 05/12/2017  . Tdap 06/15/2018   Pertinent  Health Maintenance Due  Topic Date Due  . INFLUENZA VACCINE  07/07/2020  . DEXA SCAN  Completed   Fall Risk  10/29/2020 10/22/2020 10/01/2020 07/16/2020 06/28/2020  Falls in the past year? 0 0 0 0 0  Number falls in past yr: 0 0 0 0 0  Injury with Fall? 0 0 0 0 0   Functional Status Survey:    Vitals:   10/29/20 0849  BP: 110/60  Pulse: 60  Resp: 16  Temp: (!) 97.5 F (36.4 C)  SpO2: 98%  Weight: 160 lb 3.2 oz (72.7 kg)  Height: 5' (1.524 m)   Body mass index is 31.29 kg/m. Physical Exam Vitals reviewed.  Constitutional:      General: She is not in acute distress.    Appearance: She is not ill-appearing.  HENT:     Head: Normocephalic.     Mouth/Throat:     Mouth: Mucous membranes are moist.     Pharynx: Oropharynx is clear. No oropharyngeal exudate or posterior oropharyngeal erythema.  Eyes:     General:  No scleral icterus.       Right eye: No discharge.        Left eye: No discharge.     Conjunctiva/sclera: Conjunctivae normal.     Pupils: Pupils are equal, round, and reactive to light.  Neck:     Vascular: No carotid bruit.  Cardiovascular:     Rate and Rhythm: Normal rate and regular rhythm.     Pulses: Normal pulses.     Heart sounds: Normal heart sounds. No murmur heard.  No friction rub. No gallop.   Pulmonary:     Effort: Pulmonary effort is normal. No respiratory distress.     Breath sounds: Normal breath sounds. No wheezing, rhonchi or rales.  Chest:     Chest wall: No tenderness.  Abdominal:     General: Bowel sounds are normal. There is no distension.     Palpations: Abdomen is soft. There is no mass.     Tenderness: There is no abdominal tenderness. There is no right CVA tenderness, left CVA tenderness, guarding or rebound.  Musculoskeletal:        General:  No swelling or tenderness. Normal range of motion.     Cervical back: Normal range of motion. No rigidity or tenderness.     Right lower leg: No edema.     Left lower leg: No edema.  Lymphadenopathy:     Cervical: No cervical adenopathy.  Skin:    General: Skin is warm and dry.     Coloration: Skin is not pale.     Findings: No bruising, erythema or rash.     Comments: Left lateral leg previous ringworm sites now clear.bilateral feet skin peeling has cleared on left foot and improved on the right.   Neurological:     Mental Status: She is alert and oriented to person, place, and time.     Cranial Nerves: No cranial nerve deficit.     Sensory: No sensory deficit.     Motor: No weakness.     Coordination: Coordination normal.     Gait: Gait normal.     Labs reviewed: Recent Labs    05/28/20 1201 06/28/20 1331 07/10/20 1019  NA 139 139 141  K 4.0 4.0 4.1  CL 105 108 107  CO2 26 26 28   GLUCOSE 102* 116* 98  BUN 15 20 13   CREATININE 0.86 1.05* 0.89  CALCIUM 10.2 10.8* 10.8*   Recent Labs    01/15/20 1034 07/10/20 1019  AST 13 15  ALT 7 10  BILITOT 0.5 0.4  PROT 6.5 6.7   Recent Labs    01/15/20 1034 05/28/20 1201 07/10/20 1019  WBC 5.7 5.9 6.8  NEUTROABS 2,879 3.0 3,556  HGB 13.0 14.4 13.2  HCT 39.0 45.3 40.8  MCV 86.3 90.1 88.5  PLT 311 322 289   Lab Results  Component Value Date   TSH 1.09 06/13/2018   Lab Results  Component Value Date   HGBA1C 5.6 05/29/2020   Lab Results  Component Value Date   CHOL 119 07/10/2020   HDL 43 (L) 07/10/2020   LDLCALC 58 07/10/2020   TRIG 95 07/10/2020   CHOLHDL 2.8 07/10/2020    Significant Diagnostic Results in last 30 days:  No results found.  Assessment/Plan 1. Essential hypertension, benign B/p well controlled.  2. Tinea Resolved on right foot.left foot skin peeling has improved but some noted on lateral foot. - advised to complete remaining dose of diflucan and continue on Terbinafine cream 1% to  affected areas on left lateral foot. - advised to avoid walking bare feet and wear socks with her sneakers.  - Notify provider if symptoms recurs.   3. Ringworm Previous left lateral and posterior thigh area circular rash with mid clearance has resolved.  - complete remaining dose of Fluconazole as advised on previous visit.  - Notify provider if symptoms recur   Family/ staff Communication: Reviewed plan of care with patient  Labs/tests ordered: None   Next Appointment: As needed if symptoms recurs.  Sandrea Hughs, NP

## 2020-11-03 NOTE — Progress Notes (Signed)
Erin Cunningham - 79 y.o. female MRN [id number]  Date of birth: Apr 29, 1941  Office Visit Note: Visit Date: 09/25/2020 PCP: Lauree Chandler, NP Referred by: Lauree Chandler, NP  Subjective: Chief Complaint  Patient presents with  . Lower Back - Pain  . Right Thigh - Pain   HPI: Erin Cunningham is a 79 y.o. female who comes in today For evaluation and management of continued severe worsening back pain and right lateral thigh pain.  She reports her pain is a 7 out of 10 but states that she has had about 85% relief continuing from the last injection.  Last injection was a right L4 transforaminal epidural steroid injection in August of this year.  Because she was still having pain complaints and MRI it was older we did update the lumbar spine MRI.  This is reviewed below and reviewed with the patient today.  We did use images and spine model.  She has severe multifactorial stenosis at L4-5 with consistent symptoms of back pain and hip and leg pain from that stenosis.  She does endorse some paresthesia and a feeling of weakness but no specific focal weakness.  She has a history of prior stroke.  She is followed by Dr. Metta Clines.  She has had no new injury or falls or trauma since have seen her.  No new symptoms.  She has not had recent physical therapy.  She is not very interested in surgical decompression.  She is not on anticoagulation medication.  She has had Vicodin by Dondra Prader, FNP in our office.  She has had meloxicam from her primary care provider.  She does take Tylenol.  Review of Systems  Musculoskeletal: Positive for back pain and joint pain.  Neurological: Positive for tingling.  All other systems reviewed and are negative.  Otherwise per HPI.  Assessment & Plan: Visit Diagnoses:  1. Lumbar radiculopathy   2. Spondylolisthesis of lumbar region   3. Spinal stenosis of lumbar region with neurogenic claudication     Plan: Findings:  Chronic worsening severe low back pain with  right lateral thigh pain consistent with an L5 radiculopathy.  MRI findings do show severe multifactorial stenosis at L4-5 with 7 mm of grade 1 listhesis.  She got about 85% relief of her overall pain after right L4 transforaminal injection but continues to still have right lateral leg pain.  At this point we will try Lyrica with her and refer her to physical therapy for core strengthening as well as overall conditioning.  We will consider repeat injection depending on how she is feeling.  Unfortunately no other great treatment other than surgical decompression.  Likely would encounter 1 level fusion given the listhesis but that would be determined by the surgeon.  As of right now she does not want to necessarily talked to a spine surgeon but we would make appropriate referral if needed.    Meds & Orders:  Meds ordered this encounter  Medications  . pregabalin (LYRICA) 75 MG capsule    Sig: Take 1 capsule (75 mg total) by mouth daily for 7 days, THEN 1 capsule (75 mg total) 2 (two) times daily for 23 days.    Dispense:  53 capsule    Refill:  2    Orders Placed This Encounter  Procedures  . Ambulatory referral to Physical Therapy    Follow-up: Return if symptoms worsen or fail to improve.   Procedures: No procedures performed  No notes on file  Clinical History: MRI LUMBAR SPINE WITHOUT CONTRAST  TECHNIQUE: Multiplanar, multisequence MR imaging of the lumbar spine was performed. No intravenous contrast was administered.  COMPARISON:  Radiography 07/23/2020  FINDINGS: Segmentation:  5 lumbar type vertebral bodies.  Alignment:  7 mm anterolisthesis L4-5. 2 mm anterolisthesis L3-4.  Vertebrae:  No fracture or primary bone lesion.  Conus medullaris and cauda equina: Conus extends to the L1-2 level. Conus and cauda equina appear normal.  Paraspinal and other soft tissues: Negative  Disc levels:  No significant finding at L2-3 or above. Minimal age related desiccation  of the discs. No stenosis.  L3-4: Mild bulging of the disc. Facet degeneration and hypertrophy. 2 mm of degenerative anterolisthesis. Mild canal narrowing but without visible neural compression.  L4-5: Facet arthropathy with 7 mm of anterolisthesis. Bulging of the disc. Severe stenosis at this level that could cause neural compression on either or both sides. Bilateral foraminal narrowing as well.  L5-S1: Mild bulging of the disc. Mild facet and ligamentous hypertrophy. Mild narrowing of the subarticular lateral recesses but without visible neural compression.  IMPRESSION: L4-5: Severe multifactorial spinal stenosis. Advanced facet arthropathy with facet and ligamentous hypertrophy allowing anterolisthesis of 7 mm. Bulging of the disc. Bilateral foraminal narrowing. Neural compression could occur on either or both sides.  L3-4: Facet degeneration and hypertrophy. 2 mm of anterolisthesis. Bulging of the disc. Mild canal narrowing without visible neural compression.  L5-S1: Mild disc bulge and facet hypertrophy. No compressive stenosis.   Electronically Signed   By: Nelson Chimes M.D.   On: 09/15/2020 05:42   She reports that she has never smoked. She quit smokeless tobacco use about 3 years ago.  Her smokeless tobacco use included chew.  Recent Labs    01/15/20 1032 05/29/20 0543  HGBA1C 5.5 5.6    Objective:  VS:  HT:    WT:   BMI:     BP:(!) 169/77  HR:(!) 55bpm  TEMP: ( )  RESP:  Physical Exam Constitutional:      General: She is not in acute distress.    Appearance: Normal appearance. She is normal weight. She is not ill-appearing.  HENT:     Head: Normocephalic and atraumatic.     Right Ear: External ear normal.     Left Ear: External ear normal.  Eyes:     Extraocular Movements: Extraocular movements intact.  Cardiovascular:     Rate and Rhythm: Normal rate.     Pulses: Normal pulses.  Musculoskeletal:        General: Tenderness present.      Right lower leg: No edema.     Left lower leg: No edema.     Comments: Patient has good distal strength with no pain over the greater trochanters.  No clonus or focal weakness.  She has concordant low back pain with facet loading and extension.  Skin:    Findings: No erythema, lesion or rash.  Neurological:     General: No focal deficit present.     Mental Status: She is alert and oriented to person, place, and time.     Sensory: No sensory deficit.     Motor: No weakness or abnormal muscle tone.     Coordination: Coordination normal.     Gait: Gait abnormal.  Psychiatric:        Mood and Affect: Mood normal.        Behavior: Behavior normal.     Ortho Exam  Imaging: No results found.  Past Medical/Family/Surgical/Social  History: Medications & Allergies reviewed per EMR, new medications updated. Patient Active Problem List   Diagnosis Date Noted  . Infarction of left basal ganglia ([place]) 05/29/2020  . Hypertrophic cardiomyopathy ([place]) 05/29/2020  . Anxiety and depression 05/29/2020  . Hypertensive emergency 05/28/2020  . Weakness of right lower extremity 05/28/2020  . Allergic rhinitis 06/16/2018  . Chewing tobacco nicotine dependence with nicotine-induced disorder 06/16/2018  . History of adenomatous polyp of colon 09/23/2017  . Hypercalcemia 04/20/2016  . Neck nodule 04/20/2016  . Essential hypertension, benign 07/21/2015  . Hyperlipidemia LDL goal <130 07/21/2015  . Abnormal fasting glucose 07/21/2015  . History of breast cancer in female 07/21/2015  . Major depressive disorder, recurrent, severe without psychotic features ([place]) 06/24/2015  . MDD (major depressive disorder), recurrent episode, severe ([place]) 06/24/2015  . Onychomycosis 06/12/2014  . Pain in lower limb 06/12/2014   Past Medical History:  Diagnosis Date  . Breast CA ([place]) 2006   right [facility])  . Cancer ([place])   . Helicobacter pylori gastritis 2001  . High blood pressure   . History of adenomatous  polyp of colon 09/23/2017   Oma 09/25/2017 and apparently polyps in the past as well though recalled age  . History of shingles   . Major depressive disorder 05/28/2020  . Major depressive disorder, recurrent, severe without psychotic features ([place]) 06/24/2015  . Parotid mass 2014   right benign cyst  . Personal history of radiation therapy 2006   Rt breast  . Stroke (cerebrum) ([place]) 06/28/2020   Per patient  . Wears glasses    Family History  Problem Relation Age of Onset  . Heart disease Mother        MI  . Cancer Father        bone  . Thyroid disease Neg Hx   . Hypercalcemia Neg Hx   . Colon cancer Neg Hx   . Stomach cancer Neg Hx   . Rectal cancer Neg Hx   . Esophageal cancer Neg Hx    Past Surgical History:  Procedure Laterality Date  . ABDOMINAL HYSTERECTOMY  1982   BSO  . BREAST LUMPECTOMY Right 2007  . BREAST LUMPECTOMY WITH SENTINEL LYMPH NODE BIOPSY  2005   right  . BREAST SURGERY  2004   rt br mass  . CESAREAN SECTION    . COLONOSCOPY    . ESOPHAGOGASTRODUODENOSCOPY  2001   H pylori gastritis  . EYE SURGERY     both cataracts  . PAROTIDECTOMY Right 09/05/2013   Procedure: RIGHT SUPERFICIAL PAROTIDECTOMY WITH FACIAL NERVE DISECTION;  Surgeon: [name], MD;  Location: [place];  Service: ENT;  Laterality: Right;  All documentation done by R.Ward after [phone] --done under G. Garzon's password  . RE-EXCISION OF BREAST LUMPECTOMY  2005   right   Social History   Occupational History  . Occupation: Middle school Scientist, research (physical sciences): [employer]  Tobacco Use  . Smoking status: Never Smoker  . Smokeless tobacco: Former Systems developer    Types: Secondary school teacher  . Vaping Use: Never used  Substance and Sexual Activity  . Alcohol use: Not Currently    Alcohol/week: 0.0 standard drinks    Comment: occ  . Drug use: No  . Sexual activity: Not Currently

## 2020-11-18 ENCOUNTER — Other Ambulatory Visit: Payer: Self-pay | Admitting: *Deleted

## 2020-11-18 DIAGNOSIS — I10 Essential (primary) hypertension: Secondary | ICD-10-CM

## 2020-11-18 MED ORDER — LOSARTAN POTASSIUM 100 MG PO TABS: 100.0000 mg | ORAL_TABLET | Freq: Every day | ORAL | 1 refills | Status: DC

## 2020-11-18 NOTE — Telephone Encounter (Signed)
Patient requested refill

## 2020-11-25 ENCOUNTER — Ambulatory Visit: Payer: Medicare Other | Admitting: Family

## 2020-12-09 ENCOUNTER — Telehealth: Payer: Self-pay

## 2020-12-09 ENCOUNTER — Other Ambulatory Visit: Payer: Self-pay

## 2020-12-09 ENCOUNTER — Telehealth (INDEPENDENT_AMBULATORY_CARE_PROVIDER_SITE_OTHER): Payer: BC Managed Care – PPO | Admitting: Nurse Practitioner

## 2020-12-09 ENCOUNTER — Encounter: Payer: Self-pay | Admitting: Nurse Practitioner

## 2020-12-09 ENCOUNTER — Telehealth: Payer: Self-pay | Admitting: *Deleted

## 2020-12-09 DIAGNOSIS — J069 Acute upper respiratory infection, unspecified: Secondary | ICD-10-CM

## 2020-12-09 NOTE — Telephone Encounter (Signed)
Patient called stating that she has been feeling bad for 4 days, weakness, sore throat and coughing up phlegm. Patient was given a virtual visit with Jacqlyn Krauss, NP.

## 2020-12-09 NOTE — Progress Notes (Signed)
This service is provided via telemedicine  No vital signs collected/recorded due to the encounter was a telemedicine visit.   Location of patient (ex: home, work):  Home  Patient consents to a telephone visit: Yes, see telephone visit dated 07/16/2020  Location of the provider (ex: office, home):  [facility], Office   Name of any referring provider:  N/A  Names of all persons participating in the telemedicine service and their role in the encounter:  S.Chrae B/CMA, Abbey Chatters, NP, and Patient   Time spent on call:  9 min with medical assistant      Careteam: Patient Care Team: Sharon Seller, NP as PCP - General (Geriatric Medicine) Drema Halon, MD as Consulting Physician (Otolaryngology)  Advanced Directive information    No Known Allergies  Chief Complaint  Patient presents with  . Acute Visit    Telephone visit to discuss weakness, sore throat, and productive cough x 4 days. Video visit.      HPI: Patient is a 80 y.o. female due to not feeling well for several days 5 days ago was having ringing and scratching in her right ear.  Then she started having cold chills and cough with thick green sputum and sore throat Went to store got some theraflu which helped.  Went to church yesterday but still felt very weak.  No appetite. Eating less due to no appetite. Loss of taste No loss of smell. Unsure if she has had a temperature because she does not have a thermometer  Had a sore throat but that has resolved.  Had sinus congestion but that has resolved.  Increase fatigue Shortness of breath with exertion  Reports pain in left side of back when she has increase in cough Otherwise no chest pains, no LE edema   Review of Systems:  Review of Systems  Constitutional: Positive for malaise/fatigue. Negative for chills, fever and weight loss.  HENT: Negative for congestion, ear pain, hearing loss, sore throat and tinnitus.    Respiratory: Positive for cough and shortness of breath (on exertion). Negative for sputum production.   Cardiovascular: Negative for chest pain, palpitations and leg swelling.  Gastrointestinal: Negative for abdominal pain, constipation, diarrhea and heartburn.  Genitourinary: Negative for dysuria, frequency and urgency.  Musculoskeletal: Negative for back pain, falls, joint pain and myalgias.  Skin: Negative.   Neurological: Negative for dizziness and headaches.    Past Medical History:  Diagnosis Date  . Breast CA ([facility]) 2006   right [facility])  . Cancer ([facility])   . Helicobacter pylori gastritis 2001  . High blood pressure   . History of adenomatous polyp of colon 09/23/2017   Oma 09/25/2017 and apparently polyps in the past as well though recalled age  . History of shingles   . Major depressive disorder 05/28/2020  . Major depressive disorder, recurrent, severe without psychotic features ([facility]) 06/24/2015  . Parotid mass 2014   right benign cyst  . Personal history of radiation therapy 2006   Rt breast  . Stroke (cerebrum) ([facility]) 06/28/2020   Per patient  . Wears glasses    Past Surgical History:  Procedure Laterality Date  . ABDOMINAL HYSTERECTOMY  1982   BSO  . BREAST LUMPECTOMY Right 2007  . BREAST LUMPECTOMY WITH SENTINEL LYMPH NODE BIOPSY  2005   right  . BREAST SURGERY  2004   rt br mass  . CESAREAN SECTION    . COLONOSCOPY    . ESOPHAGOGASTRODUODENOSCOPY  2001  H pylori gastritis  . EYE SURGERY     both cataracts  . PAROTIDECTOMY Right 09/05/2013   Procedure: RIGHT SUPERFICIAL PAROTIDECTOMY WITH FACIAL NERVE DISECTION;  Surgeon: [name], MD;  Location: [place];  Service: ENT;  Laterality: Right;  All documentation done by R.Ward after [phone] --done under G. Garzon's password  . RE-EXCISION OF BREAST LUMPECTOMY  2005   right   Social History:   reports that she has never smoked. She quit smokeless tobacco use about 3 years ago.  Her  smokeless tobacco use included chew. She reports previous alcohol use. She reports that she does not use drugs.  Family History  Problem Relation Age of Onset  . Heart disease Mother        MI  . Cancer Father        bone  . Thyroid disease Neg Hx   . Hypercalcemia Neg Hx   . Colon cancer Neg Hx   . Stomach cancer Neg Hx   . Rectal cancer Neg Hx   . Esophageal cancer Neg Hx     Medications: Patient's Medications  New Prescriptions   No medications on file  Previous Medications   ACETAMINOPHEN (TYLENOL) 500 MG TABLET    Take 2 tablets (1,000 mg total) by mouth every 6 (six) hours as needed.   ASPIRIN EC 81 MG EC TABLET    Take 1 tablet (81 mg total) by mouth daily. Swallow whole.   ATORVASTATIN (LIPITOR) 80 MG TABLET    Take 1 tablet (80 mg total) by mouth daily.   CARVEDILOL (COREG) 6.25 MG TABLET    Take 6.25 mg by mouth daily.   CHOLECALCIFEROL (VITAMIN D) 50 MCG (2000 UT) TABLET    Take 2,000 Units by mouth daily.   HYDROCHLOROTHIAZIDE (HYDRODIURIL) 12.5 MG TABLET    Take 1 tablet (12.5 mg total) by mouth daily.   HYDROCODONE-ACETAMINOPHEN (NORCO/VICODIN) 5-325 MG TABLET    Take 1 tablet by mouth every 6 (six) hours as needed for moderate pain.   LOSARTAN (COZAAR) 100 MG TABLET    Take 1 tablet (100 mg total) by mouth daily.   MELOXICAM (MOBIC) 15 MG TABLET    Take 1 tablet (15 mg total) by mouth daily.   SERTRALINE (ZOLOFT) 50 MG TABLET    Take 50 mg by mouth daily.   TRAZODONE (DESYREL) 50 MG TABLET    Take 1 tablet (50 mg total) by mouth at bedtime as needed for sleep.  Modified Medications   No medications on file  Discontinued Medications   FLUCONAZOLE (DIFLUCAN) 150 MG TABLET    Take one tablet by mouth once weekly x 4 weeks.   PREGABALIN (LYRICA) 75 MG CAPSULE    Take 1 capsule (75 mg total) by mouth daily for 7 days, THEN 1 capsule (75 mg total) 2 (two) times daily for 23 days.   TERBINAFINE (LAMISIL) 1 % CREAM    Apply 1 application topically 2 (two) times daily. To  affected areas    Physical Exam:  There were no vitals filed for this visit. There is no height or weight on file to calculate BMI. Wt Readings from Last 3 Encounters:  10/29/20 160 lb 3.2 oz (72.7 kg)  10/22/20 162 lb 3.2 oz (73.6 kg)  10/01/20 164 lb 9.6 oz (74.7 kg)      Labs reviewed: Basic Metabolic Panel: Recent Labs    05/28/20 1201 06/28/20 1331 07/10/20 1019  NA 139 139 141  K 4.0 4.0  4.1  CL 105 108 107  CO2 26 26 28   GLUCOSE 102* 116* 98  BUN 15 20 13   CREATININE 0.86 1.05* 0.89  CALCIUM 10.2 10.8* 10.8*   Liver Function Tests: Recent Labs    01/15/20 1034 07/10/20 1019  AST 13 15  ALT 7 10  BILITOT 0.5 0.4  PROT 6.5 6.7   No results for input(s): LIPASE, AMYLASE in the last 8760 hours. No results for input(s): AMMONIA in the last 8760 hours. CBC: Recent Labs    01/15/20 1034 05/28/20 1201 07/10/20 1019  WBC 5.7 5.9 6.8  NEUTROABS 2,879 3.0 3,556  HGB 13.0 14.4 13.2  HCT 39.0 45.3 40.8  MCV 86.3 90.1 88.5  PLT 311 322 289   Lipid Panel: Recent Labs    01/15/20 1034 05/29/20 0543 07/10/20 1019  CHOL 204* 211* 119  HDL 46* 44 43*  LDLCALC 138* 145* 58  TRIG 97 111 95  CHOLHDL 4.4 4.8 2.8   TSH: No results for input(s): TSH in the last 8760 hours. A1C: Lab Results  Component Value Date   HGBA1C 5.6 05/29/2020     Assessment/Plan 1. URI with cough and congestion Will need to rule out COVID.  --educated to go online to schedule appt for COVID testing through [email or web address], [facility] or [facility].  -make sure you are staying well hydrated -recommended to take Vit C 1000 mg twice daily, Vit D 5000 units, zinc 50 mg daily for 14 days -to do deep breathing exercises every hour -mucinex twice daily  for chest congestion -do not sit in bed all day, sit up in chair and walk around as tolerated -leg exercises to prevent blood clots in leg -continue ASA EC 81 mg daily  -education provided on when to follow up and when to seek  immediate medication attention through the ED.  - DG Chest 2 View; Future to rule out pneumonia -encouraged to get a pulse ox at pharmacy, to monitor O2, goal greater than 90, and to get thermometer to monitor temperature.   Next appt: 12/20/2020 Erin Cunningham. Harle Battiest  [facility] [phone]    Virtual Visit via Deloris Ping  I connected with patient on 12/09/20 at  2:15 PM EST by video and verified that I am speaking with the correct person using two identifiers.  Location: Patient: home Provider: [place]    I discussed the limitations, risks, security and privacy concerns of performing an evaluation and management service by telephone and the availability of in person appointments. I also discussed with the patient that there may be a patient responsible charge related to this service. The patient expressed understanding and agreed to proceed.   I discussed the assessment and treatment plan with the patient. The patient was provided an opportunity to ask questions and all were answered. The patient agreed with the plan and demonstrated an understanding of the instructions.   The patient was advised to call back or seek an in-person evaluation if the symptoms worsen or if the condition fails to improve as anticipated.  I provided 20 minutes of non-face-to-face time during this encounter.  Erin Cunningham. Harle Battiest Avs printed and mailed

## 2020-12-09 NOTE — Telephone Encounter (Signed)
Patient notified and agreed.  

## 2020-12-09 NOTE — Patient Instructions (Addendum)
To get pulse Ox at pharmacy- O2 should be greater than 90 %  -to get COVID testing through pharmacy or [facility] -make sure you are staying well hydrated -recommended to take Vit C 1000 mg twice daily, Vit D 5000 units, zinc 50 mg daily for 14 days -to do deep breathing exercises every hour -mucinex twice daily with full glass of water for chest congestion -do not sit in bed all day, sit up in chair and walk around as tolerated -leg exercises to prevent blood clots in leg  -education provided on when to follow up and when to seek immediate medication attention through the ED.    COVID-19 COVID-19 is a respiratory infection that is caused by a virus called severe acute respiratory syndrome coronavirus 2 (SARS-CoV-2). The disease is also known as coronavirus disease or novel coronavirus. In some people, the virus may not cause any symptoms. In others, it may cause a serious infection. The infection can get worse quickly and can lead to complications, such as:  Pneumonia, or infection of the lungs.  Acute respiratory distress syndrome or ARDS. This is a condition in which fluid build-up in the lungs prevents the lungs from filling with air and passing oxygen into the blood.  Acute respiratory failure. This is a condition in which there is not enough oxygen passing from the lungs to the body or when carbon dioxide is not passing from the lungs out of the body.  Sepsis or septic shock. This is a serious bodily reaction to an infection.  Blood clotting problems.  Secondary infections due to bacteria or fungus.  Organ failure. This is when your body's organs stop working. The virus that causes COVID-19 is contagious. This means that it can spread from person to person through droplets from coughs and sneezes (respiratory secretions). What are the causes? This illness is caused by a virus. You may catch the virus by:  Breathing in droplets from an infected person. Droplets can be spread by a  person breathing, speaking, singing, coughing, or sneezing.  Touching something, like a table or a doorknob, that was exposed to the virus (contaminated) and then touching your mouth, nose, or eyes. What increases the risk? Risk for infection You are more likely to be infected with this virus if you:  Are within 6 feet (2 meters) of a person with COVID-19.  Provide care for or live with a person who is infected with COVID-19.  Spend time in crowded indoor spaces or live in shared housing. Risk for serious illness You are more likely to become seriously ill from the virus if you:  Are 79 years of age or older. The higher your age, the more you are at risk for serious illness.  Live in a nursing home or long-term care facility.  Have cancer.  Have a long-term (chronic) disease such as: ? Chronic lung disease, including chronic obstructive pulmonary disease or asthma. ? A long-term disease that lowers your body's ability to fight infection (immunocompromised). ? Heart disease, including heart failure, a condition in which the arteries that lead to the heart become narrow or blocked (coronary artery disease), a disease which makes the heart muscle thick, weak, or stiff (cardiomyopathy). ? Diabetes. ? Chronic kidney disease. ? Sickle cell disease, a condition in which red blood cells have an abnormal "sickle" shape. ? Liver disease.  Are obese. What are the signs or symptoms? Symptoms of this condition can range from mild to severe. Symptoms may appear any time from 2  to 14 days after being exposed to the virus. They include:  A fever or chills.  A cough.  Difficulty breathing.  Headaches, body aches, or muscle aches.  Runny or stuffy (congested) nose.  A sore throat.  New loss of taste or smell. Some people may also have stomach problems, such as nausea, vomiting, or diarrhea. Other people may not have any symptoms of COVID-19. How is this diagnosed? This condition may  be diagnosed based on:  Your signs and symptoms, especially if: ? You live in an area with a COVID-19 outbreak. ? You recently traveled to or from an area where the virus is common. ? You provide care for or live with a person who was diagnosed with COVID-19. ? You were exposed to a person who was diagnosed with COVID-19.  A physical exam.  Lab tests, which may include: ? Taking a sample of fluid from the back of your nose and throat (nasopharyngeal fluid), your nose, or your throat using a swab. ? A sample of mucus from your lungs (sputum). ? Blood tests.  Imaging tests, which may include, X-rays, CT scan, or ultrasound. How is this treated? At present, there is no medicine to treat COVID-19. Medicines that treat other diseases are being used on a trial basis to see if they are effective against COVID-19. Your health care provider will talk with you about ways to treat your symptoms. For most people, the infection is mild and can be managed at home with rest, fluids, and over-the-counter medicines. Treatment for a serious infection usually takes places in a hospital intensive care unit (ICU). It may include one or more of the following treatments. These treatments are given until your symptoms improve.  Receiving fluids and medicines through an IV.  Supplemental oxygen. Extra oxygen is given through a tube in the nose, a face mask, or a hood.  Positioning you to lie on your stomach (prone position). This makes it easier for oxygen to get into the lungs.  Continuous positive airway pressure (CPAP) or bi-level positive airway pressure (BPAP) machine. This treatment uses mild air pressure to keep the airways open. A tube that is connected to a motor delivers oxygen to the body.  Ventilator. This treatment moves air into and out of the lungs by using a tube that is placed in your windpipe.  Tracheostomy. This is a procedure to create a hole in the neck so that a breathing tube can be  inserted.  Extracorporeal membrane oxygenation (ECMO). This procedure gives the lungs a chance to recover by taking over the functions of the heart and lungs. It supplies oxygen to the body and removes carbon dioxide. Follow these instructions at home: Lifestyle  If you are sick, stay home except to get medical care. Your health care provider will tell you how long to stay home. Call your health care provider before you go for medical care.  Rest at home as told by your health care provider.  Do not use any products that contain nicotine or tobacco, such as cigarettes, e-cigarettes, and chewing tobacco. If you need help quitting, ask your health care provider.  Return to your normal activities as told by your health care provider. Ask your health care provider what activities are safe for you. General instructions  Take over-the-counter and prescription medicines only as told by your health care provider.  Drink enough fluid to keep your urine pale yellow.  Keep all follow-up visits as told by your health care provider. This  is important. How is this prevented?  There is no vaccine to help prevent COVID-19 infection. However, there are steps you can take to protect yourself and others from this virus. To protect yourself:   Do not travel to areas where COVID-19 is a risk. The areas where COVID-19 is reported change often. To identify high-risk areas and travel restrictions, check the CDC travel website: FatFares.com.br  If you live in, or must travel to, an area where COVID-19 is a risk, take precautions to avoid infection. ? Stay away from people who are sick. ? Wash your hands often with soap and water for 20 seconds. If soap and water are not available, use an alcohol-based hand sanitizer. ? Avoid touching your mouth, face, eyes, or nose. ? Avoid going out in public, follow guidance from your state and local health authorities. ? If you must go out in public, wear a  cloth face covering or face mask. Make sure your mask covers your nose and mouth. ? Avoid crowded indoor spaces. Stay at least 6 feet (2 meters) away from others. ? Disinfect objects and surfaces that are frequently touched every day. This may include:  Counters and tables.  Doorknobs and light switches.  Sinks and faucets.  Electronics, such as phones, remote controls, keyboards, computers, and tablets. To protect others: If you have symptoms of COVID-19, take steps to prevent the virus from spreading to others.  If you think you have a COVID-19 infection, contact your health care provider right away. Tell your health care team that you think you may have a COVID-19 infection.  Stay home. Leave your house only to seek medical care. Do not use public transport.  Do not travel while you are sick.  Wash your hands often with soap and water for 20 seconds. If soap and water are not available, use alcohol-based hand sanitizer.  Stay away from other members of your household. Let healthy household members care for children and pets, if possible. If you have to care for children or pets, wash your hands often and wear a mask. If possible, stay in your own room, separate from others. Use a different bathroom.  Make sure that all people in your household wash their hands well and often.  Cough or sneeze into a tissue or your sleeve or elbow. Do not cough or sneeze into your hand or into the air.  Wear a cloth face covering or face mask. Make sure your mask covers your nose and mouth. Where to find more information  Centers for Disease Control and Prevention: PurpleGadgets.be  World Health Organization: https://www.castaneda.info/ Contact a health care provider if:  You live in or have traveled to an area where COVID-19 is a risk and you have symptoms of the infection.  You have had contact with someone who has COVID-19 and you have symptoms of the  infection. Get help right away if:  You have trouble breathing.  You have pain or pressure in your chest.  You have confusion.  You have bluish lips and fingernails.  You have difficulty waking from sleep.  You have symptoms that get worse. These symptoms may represent a serious problem that is an emergency. Do not wait to see if the symptoms will go away. Get medical help right away. Call your local emergency services (911 in the [place] Do not drive yourself to the hospital. Let the emergency medical personnel know if you think you have COVID-19. Summary  COVID-19 is a respiratory infection that is caused  by a virus. It is also known as coronavirus disease or novel coronavirus. It can cause serious infections, such as pneumonia, acute respiratory distress syndrome, acute respiratory failure, or sepsis.  The virus that causes COVID-19 is contagious. This means that it can spread from person to person through droplets from breathing, speaking, singing, coughing, or sneezing.  You are more likely to develop a serious illness if you are 58 years of age or older, have a weak immune system, live in a nursing home, or have chronic disease.  There is no medicine to treat COVID-19. Your health care provider will talk with you about ways to treat your symptoms.  Take steps to protect yourself and others from infection. Wash your hands often and disinfect objects and surfaces that are frequently touched every day. Stay away from people who are sick and wear a mask if you are sick. This information is not intended to replace advice given to you by your health care provider. Make sure you discuss any questions you have with your health care provider. Document Revised: 09/22/2019 Document Reviewed: 12/29/2018 Elsevier Patient Education  2020 [employer].

## 2020-12-09 NOTE — Telephone Encounter (Signed)
[facility] is close for holiday but will be open tomorrow (I called clarify) Yes she needs appt to get COVID test, if [facility] wants to [facility] this is okay as well.

## 2020-12-09 NOTE — Telephone Encounter (Signed)
Patient called and stated that you told her to go get a Covid test and X-Ray done.   Stated that she went to the pharmacy to get covid tested but they require appointments, so she is going to go to [facility], patient stated they are doing it there.   Also she went to get her X-Ray at [facility] and they are closed.   Patient is wanting to know what you want her to do.  Please Advise.

## 2020-12-10 ENCOUNTER — Other Ambulatory Visit: Payer: Self-pay

## 2020-12-10 ENCOUNTER — Ambulatory Visit (INDEPENDENT_AMBULATORY_CARE_PROVIDER_SITE_OTHER): Payer: BC Managed Care – PPO | Admitting: Family

## 2020-12-10 ENCOUNTER — Encounter: Payer: Self-pay | Admitting: Family

## 2020-12-10 VITALS — BP 134/70 | HR 78 | Temp 96.6°F | Resp 16 | Ht 60.0 in

## 2020-12-10 DIAGNOSIS — J069 Acute upper respiratory infection, unspecified: Secondary | ICD-10-CM

## 2020-12-10 LAB — POCT INFLUENZA A/B
Influenza A, POC: NEGATIVE
Influenza B, POC: NEGATIVE

## 2020-12-10 MED ORDER — ZINC GLUCONATE 50 MG PO TABS: 50.0000 mg | ORAL_TABLET | Freq: Every day | ORAL | 0 refills | Status: AC

## 2020-12-10 MED ORDER — VITAMIN C-ROSE HIPS 500 MG PO TABS: 500.0000 mg | ORAL_TABLET | Freq: Every day | ORAL | 0 refills | Status: AC

## 2020-12-10 NOTE — Telephone Encounter (Signed)
Patient is calling VERY ANGRY and screaming in the phone stating that she is getting the run around and she is sick.  Stated that she wants to speak with Shanda Bumps today!  Stated that she went for her X-Ray and they will NOT do it without her having her COVID test done first. (still at [facility]) Patient stated that she cannot find anywhere to do her testing.  I asked her if she made an appointment with the pharmacy to have the testing done like Shanda Bumps had discussed at her appointment and she stated that No such thing was discussed and she is getting very frustrated with the run around.   Patient is awaiting a call from [place]. Stated that Shanda Bumps needs to make it ASAP.   Please call #[phone]

## 2020-12-10 NOTE — Telephone Encounter (Signed)
Attempted to call [name]. Someone picked up but then hung-up. Erin Cunningham called pt to offer appt in office since we now have supplies to test in office and she has been scheduled.

## 2020-12-10 NOTE — Patient Instructions (Addendum)
-   take Vitamin C 500 mg tablet one by mouth twice daily  - Take Zinc 50 mg tablet one by mouth daily x 14 days  - Vitamin D 2000 units one by mouth daily x 14 days  - Increase your water/Fluid intake to at least 6-8 glasses daily  - Mucinex 10 ml by mouth every 8 hrs as needed for cough  - Notify provider or go to ED if symptoms worsen or fail to improve or develops any shortness of breath,chest tightness or chest pain

## 2020-12-10 NOTE — Progress Notes (Signed)
Provider: Ashleen Demma FNP-C  Lauree Chandler, NP  Patient Care Team: Lauree Chandler, NP as PCP - General (Geriatric Medicine) [name], MD as Consulting Physician (Otolaryngology)  Extended Emergency Contact Information Primary Emergency Contact: Raczka,[name] Address: [address]          [place], [place] [address] [name] of [place] Phone: [phone] Mobile Phone: [phone] Relation: Spouse Secondary Emergency Contact: Bobbye Morton Mobile Phone: [phone] Relation: Son  Code Status:  DNR Goals of care: Advanced Directive information Advanced Directives 12/10/2020  Does Patient Have a Medical Advance Directive? No  Would patient like information on creating a medical advance directive? No - Patient declined  Some encounter information is confidential and restricted. Go to Review Flowsheets activity to see all data.     Chief Complaint  Patient presents with  . Acute Visit    Complains of fatigue, coughing, elevated bp, wants to be tested for covid-19.    HPI:  Pt is a 80 y.o. female seen today for an acute visit for evaluation of cough,fatigue since 12/05/2020.could not get out of bed on Friday 12/06/2020 had fatigue and chills.saturday couldn't eat .Appetite is still not great Has been coughing up white yellow mucuos.Has used Theraflu.feels so drained when she cough.Has used chicken noodle soup.she was advised to taken vit C,vit D and zinc by PCP yesterday but has not bought them.  She denies any fever,chills,nausea,vomiting,diarrhea,loss of taste/smell,chest tightness,chest pain,palpitation or shortness of breath. States was in contact with a lady who came to their house later learned that she tested positive for COVID-19.she wpould like to be screening for COVID-19.states has had her [place] except booster vaccine.    Past Medical History:  Diagnosis Date  . Breast CA ([place]) 2006   right [facility])  . Cancer  ([place])   . Helicobacter pylori gastritis 2001  . High blood pressure   . History of adenomatous polyp of colon 09/23/2017   [place] 09/25/2017 and apparently polyps in the past as well though recalled age  . History of shingles   . Major depressive disorder 05/28/2020  . Major depressive disorder, recurrent, severe without psychotic features ([place]) 06/24/2015  . Parotid mass 2014   right benign cyst  . Personal history of radiation therapy 2006   Rt breast  . Stroke (cerebrum) ([place]) 06/28/2020   Per patient  . Wears glasses    Past Surgical History:  Procedure Laterality Date  . ABDOMINAL HYSTERECTOMY  1982   BSO  . BREAST LUMPECTOMY Right 2007  . BREAST LUMPECTOMY WITH SENTINEL LYMPH NODE BIOPSY  2005   right  . BREAST SURGERY  2004   rt br mass  . CESAREAN SECTION    . COLONOSCOPY    . ESOPHAGOGASTRODUODENOSCOPY  2001   H pylori gastritis  . EYE SURGERY     both cataracts  . PAROTIDECTOMY Right 09/05/2013   Procedure: RIGHT SUPERFICIAL PAROTIDECTOMY WITH FACIAL NERVE DISECTION;  Surgeon: [name], MD;  Location: [facility];  Service: ENT;  Laterality: Right;  All documentation done by R.Ward after [phone] --done under G. Garzon's password  . RE-EXCISION OF BREAST LUMPECTOMY  2005   right    No Known Allergies  Outpatient Encounter Medications as of 12/10/2020  Medication Sig  . acetaminophen (TYLENOL) 500 MG tablet Take 2 tablets (1,000 mg total) by mouth every 6 (six) hours as needed.  Marland Kitchen aspirin EC 81 MG EC tablet Take 1 tablet (81 mg total) by mouth  daily. Swallow whole.  Marland Kitchen atorvastatin (LIPITOR) 80 MG tablet Take 1 tablet (80 mg total) by mouth daily.  . carvedilol (COREG) 6.25 MG tablet Take 6.25 mg by mouth daily.  . Cholecalciferol (VITAMIN D) 50 MCG (2000 UT) tablet Take 2,000 Units by mouth daily.  . hydrochlorothiazide (HYDRODIURIL) 12.5 MG tablet Take 1 tablet (12.5 mg total) by mouth daily.  Marland Kitchen HYDROcodone-acetaminophen (NORCO/VICODIN) 5-325 MG  tablet Take 1 tablet by mouth every 6 (six) hours as needed for moderate pain.  Marland Kitchen losartan (COZAAR) 100 MG tablet Take 1 tablet (100 mg total) by mouth daily.  . meloxicam (MOBIC) 15 MG tablet Take 1 tablet (15 mg total) by mouth daily.  . sertraline (ZOLOFT) 50 MG tablet Take 50 mg by mouth daily.  . traZODone (DESYREL) 50 MG tablet Take 1 tablet (50 mg total) by mouth at bedtime as needed for sleep.   No facility-administered encounter medications on file as of 12/10/2020.    Review of Systems  Constitutional: Positive for fatigue. Negative for appetite change, chills and fever.  HENT: Negative for congestion, rhinorrhea, sinus pressure, sinus pain, sneezing and sore throat.   Eyes: Negative for pain, discharge, redness and itching.  Respiratory: Positive for cough. Negative for chest tightness, shortness of breath and wheezing.   Cardiovascular: Negative for chest pain, palpitations and leg swelling.  Neurological: Negative for dizziness, light-headedness and headaches.    Immunization History  Administered Date(s) Administered  . Fluad Quad(high Dose 65+) 01/15/2020  . Influenza, High Dose Seasonal PF 09/14/2018  . Influenza,inj,Quad PF,6+ Mos 11/06/2015  . PFIZER SARS-COV-2 Vaccination 02/03/2020, 02/24/2020  . PPD Test 12/10/2015  . Pneumococcal Conjugate-13 01/16/2015  . Pneumococcal Polysaccharide-23 05/12/2017  . Tdap 06/15/2018   Pertinent  Health Maintenance Due  Topic Date Due  . INFLUENZA VACCINE  07/07/2020  . DEXA SCAN  Completed   Fall Risk  12/10/2020 10/29/2020 10/22/2020 10/01/2020 07/16/2020  Falls in the past year? 0 0 0 0 0  Number falls in past yr: 0 0 0 0 0  Injury with Fall? 0 0 0 0 0   Functional Status Survey:    Vitals:   12/10/20 1443  BP: 134/70  Pulse: 78  Resp: 16  Temp: (!) 96.6 F (35.9 C)  SpO2: 99%  Height: 5' (1.524 m)   Body mass index is 31.29 kg/m. Physical Exam Vitals reviewed.  Constitutional:      General: She is not in  acute distress.    Appearance: She is not ill-appearing.  HENT:     Head: Normocephalic.     Right Ear: Tympanic membrane, ear canal and external ear normal. There is no impacted cerumen.     Left Ear: Tympanic membrane, ear canal and external ear normal. There is no impacted cerumen.     Nose: Nose normal. No congestion or rhinorrhea.     Mouth/Throat:     Mouth: Mucous membranes are moist.     Pharynx: Oropharynx is clear. No oropharyngeal exudate or posterior oropharyngeal erythema.  Eyes:     General: No scleral icterus.       Right eye: No discharge.        Left eye: No discharge.     Conjunctiva/sclera: Conjunctivae normal.     Pupils: Pupils are equal, round, and reactive to light.     Comments: Corrective lens in place   Cardiovascular:     Rate and Rhythm: Normal rate and regular rhythm.     Pulses: Normal pulses.  Heart sounds: Normal heart sounds. No murmur heard. No friction rub. No gallop.   Pulmonary:     Effort: Pulmonary effort is normal. No respiratory distress.     Breath sounds: Normal breath sounds. No wheezing, rhonchi or rales.  Chest:     Chest wall: No tenderness.  Abdominal:     General: Bowel sounds are normal. There is no distension.     Palpations: Abdomen is soft. There is no mass.     Tenderness: There is no abdominal tenderness. There is no right CVA tenderness, left CVA tenderness, guarding or rebound.  Musculoskeletal:        General: No swelling or tenderness. Normal range of motion.     Right lower leg: No edema.     Left lower leg: No edema.  Skin:    General: Skin is warm and dry.     Coloration: Skin is not pale.     Findings: No bruising, erythema or rash.  Neurological:     Mental Status: She is oriented to person, place, and time.     Cranial Nerves: No cranial nerve deficit.     Sensory: No sensory deficit.     Motor: No weakness.     Coordination: Coordination normal.     Gait: Gait normal.  Psychiatric:        Mood and  Affect: Mood normal.        Speech: Speech normal.        Behavior: Behavior normal.        Thought Content: Thought content normal.        Judgment: Judgment normal.    Labs reviewed: Recent Labs    05/28/20 1201 06/28/20 1331 07/10/20 1019  NA 139 139 141  K 4.0 4.0 4.1  CL 105 108 107  CO2 26 26 28   GLUCOSE 102* 116* 98  BUN 15 20 13   CREATININE 0.86 1.05* 0.89  CALCIUM 10.2 10.8* 10.8*   Recent Labs    01/15/20 1034 07/10/20 1019  AST 13 15  ALT 7 10  BILITOT 0.5 0.4  PROT 6.5 6.7   Recent Labs    01/15/20 1034 05/28/20 1201 07/10/20 1019  WBC 5.7 5.9 6.8  NEUTROABS 2,879 3.0 3,556  HGB 13.0 14.4 13.2  HCT 39.0 45.3 40.8  MCV 86.3 90.1 88.5  PLT 311 322 289   Lab Results  Component Value Date   TSH 1.09 06/13/2018   Lab Results  Component Value Date   HGBA1C 5.6 05/29/2020   Lab Results  Component Value Date   CHOL 119 07/10/2020   HDL 43 (L) 07/10/2020   LDLCALC 58 07/10/2020   TRIG 95 07/10/2020   CHOLHDL 2.8 07/10/2020    Significant Diagnostic Results in last 30 days:  No results found.  Assessment/Plan   Upper respiratory tract infection, unspecified type Afebrile.bilateral lungs clear to Auscultation. - POC Influenza A/B are negative - SARS-COV-2 RNA,(COVID-19) QUAL NAAT - Advised to take Vitamin C 500 mg tablet one by mouth twice daily  - Take Zinc 50 mg tablet one by mouth daily x 14 days  - Vitamin D 2000 units one by mouth daily x 14 days  - Increase your water/Fluid intake to at least 6-8 glasses daily  - Mucinex 10 ml by mouth every 8 hrs as needed for cough  - Notify provider or go to ED if symptoms worsen or fail to improve or develops any shortness of breath,chest tightness or chest pain  Family/ staff  Communication: Reviewed plan of care with patient verbalized understanding.   Labs/tests ordered: None   Next Appointment: As needed if symptoms worsen or fail to improve.   Sandrea Hughs, NP

## 2020-12-12 LAB — SARS-COV-2 RNA,(COVID-19) QUALITATIVE NAAT: SARS CoV2 RNA: DETECTED — AB

## 2020-12-16 ENCOUNTER — Telehealth: Payer: Self-pay

## 2020-12-16 NOTE — Telephone Encounter (Signed)
Apparently she still has a cough, she does not need to go back to work until she is symptom free

## 2020-12-16 NOTE — Addendum Note (Signed)
Addended by: Logan Bores on: 12/16/2020 04:19 PM   Modules accepted: Orders

## 2020-12-16 NOTE — Telephone Encounter (Signed)
Yes I had ordered a chest xray and I believe Dinah also wanted her to get one, we can order it at [facility] if needed because of her COVID diagnosis there was some issues with her going to [facility].

## 2020-12-16 NOTE — Telephone Encounter (Signed)
Spoke with patient and she is aware that she can walk in at [facility] Main Entrance A, and go to the radiology department to get chest xray.  Patient also states she spoke with [facility] today and would like for her and Dinah to have her work note available tomorrow that indicates she can return to work, state she gave all the details to [place].    Message will be forwarded to [facility] to complete documentation of conversation

## 2020-12-16 NOTE — Telephone Encounter (Signed)
Patient called and states that she needs a note to return back to work. I told her that I would send message to PCP Lauree Chandler, NP . Please Advise.

## 2020-12-16 NOTE — Telephone Encounter (Signed)
Patient was transferred to my voicemail this morning versus to clinical intake where she would have received faster service.  Patient states she is suppose to return to work this week after a covid 19 diagnosis on 12/10/2020. Patient spoke with someone from the health department today and they recommended she call her PCP to discuss getting a chest xray due to ongoing productive cough. Patient states phlegm is clear but thick and she would like to know what to do   Side note: It appears a chest xray order in in the system ordered by Lauree Chandler, NP on 12/09/2020  Please advise

## 2020-12-17 ENCOUNTER — Ambulatory Visit
Admission: RE | Admit: 2020-12-17 | Discharge: 2020-12-17 | Disposition: A | Discharge status: Home / Self Care | Payer: Self-pay | Source: Ambulatory Visit | Attending: Nurse Practitioner | Admitting: Nurse Practitioner

## 2020-12-17 DIAGNOSIS — J069 Acute upper respiratory infection, unspecified: Secondary | ICD-10-CM

## 2020-12-17 NOTE — Telephone Encounter (Signed)
Please refer to phone encounter from yesterday.

## 2020-12-18 NOTE — Telephone Encounter (Signed)
Patient letter documented in chart. Completed encounter.

## 2020-12-20 ENCOUNTER — Other Ambulatory Visit: Payer: Self-pay

## 2020-12-20 ENCOUNTER — Ambulatory Visit: Payer: Medicare Other | Admitting: Nurse Practitioner

## 2020-12-20 ENCOUNTER — Encounter: Payer: Self-pay | Admitting: Nurse Practitioner

## 2020-12-20 ENCOUNTER — Ambulatory Visit (INDEPENDENT_AMBULATORY_CARE_PROVIDER_SITE_OTHER): Payer: BC Managed Care – PPO | Admitting: Nurse Practitioner

## 2020-12-20 VITALS — BP 150/100 | HR 54 | Temp 97.0°F | Ht 60.0 in | Wt 160.8 lb

## 2020-12-20 DIAGNOSIS — I10 Essential (primary) hypertension: Secondary | ICD-10-CM | POA: Diagnosis not present

## 2020-12-20 DIAGNOSIS — Z8673 Personal history of transient ischemic attack (TIA), and cerebral infarction without residual deficits: Secondary | ICD-10-CM | POA: Diagnosis not present

## 2020-12-20 DIAGNOSIS — Z1159 Encounter for screening for other viral diseases: Secondary | ICD-10-CM | POA: Diagnosis not present

## 2020-12-20 DIAGNOSIS — I700 Atherosclerosis of aorta: Secondary | ICD-10-CM

## 2020-12-20 DIAGNOSIS — E785 Hyperlipidemia, unspecified: Secondary | ICD-10-CM | POA: Diagnosis not present

## 2020-12-20 DIAGNOSIS — F325 Major depressive disorder, single episode, in full remission: Secondary | ICD-10-CM

## 2020-12-20 DIAGNOSIS — Z23 Encounter for immunization: Secondary | ICD-10-CM | POA: Diagnosis not present

## 2020-12-20 DIAGNOSIS — F419 Anxiety disorder, unspecified: Secondary | ICD-10-CM

## 2020-12-20 NOTE — Progress Notes (Signed)
Careteam: Patient Care Team: Lauree Chandler, NP as PCP - General (Geriatric Medicine) [name], MD as Consulting Physician (Otolaryngology)  PLACE OF SERVICE:  [place] Directive information Does Patient Have a Medical Advance Directive?: No, Would patient like information on creating a medical advance directive?: No - Patient declined  No Known Allergies  Chief Complaint  Patient presents with  . Medical Management of Chronic Issues    2 month follow up visit.Patient not having any more symptoms associated with COVID. Patient still having phlegm in throat. Patient received Flu vaccine 01/15/2020.  Marland Kitchen Best Practice Recommendations    Hepatitis C screening,COVID booster.     HPI: Patient is a 80 y.o. female for routine follow up.   covid symptoms have resolved. Taste back. Energy back.  Hyperlipidemia- LDL goal <70, at goal in august. Continues on lipitor  Hypertension- did not take medication today.  Takes at home and reports good control. Reports she is generally complaint with medication.   Anxiety/depression- controlled on zoloft. Anxiety and depression in remission at this time.   CVA without late effect- continues on asa and plavix  Does not take mobic- no aches or pains.   No trouble sleeping, has not used trazodone.   Review of Systems:  Review of Systems  Constitutional: Negative for chills, fever and weight loss.  HENT: Negative for tinnitus.   Respiratory: Negative for cough, sputum production and shortness of breath.   Cardiovascular: Negative for chest pain, palpitations and leg swelling.  Gastrointestinal: Negative for abdominal pain, constipation, diarrhea and heartburn.  Genitourinary: Negative for dysuria, frequency and urgency.  Musculoskeletal: Negative for back pain, falls, joint pain and myalgias.  Skin: Negative.   Neurological: Negative for dizziness and headaches.  Psychiatric/Behavioral: Negative for depression  and memory loss. The patient does not have insomnia.     Past Medical History:  Diagnosis Date  . Breast CA ([place]) 2006   right [facility])  . Cancer ([place])   . Helicobacter pylori gastritis 2001  . High blood pressure   . History of adenomatous polyp of colon 09/23/2017   Oma 09/25/2017 and apparently polyps in the past as well though recalled age  . History of shingles   . Major depressive disorder 05/28/2020  . Major depressive disorder, recurrent, severe without psychotic features ([place]) 06/24/2015  . Parotid mass 2014   right benign cyst  . Personal history of radiation therapy 2006   Rt breast  . Stroke (cerebrum) ([place]) 06/28/2020   Per patient  . Wears glasses    Past Surgical History:  Procedure Laterality Date  . ABDOMINAL HYSTERECTOMY  1982   BSO  . BREAST LUMPECTOMY Right 2007  . BREAST LUMPECTOMY WITH SENTINEL LYMPH NODE BIOPSY  2005   right  . BREAST SURGERY  2004   rt br mass  . CESAREAN SECTION    . COLONOSCOPY    . ESOPHAGOGASTRODUODENOSCOPY  2001   H pylori gastritis  . EYE SURGERY     both cataracts  . PAROTIDECTOMY Right 09/05/2013   Procedure: RIGHT SUPERFICIAL PAROTIDECTOMY WITH FACIAL NERVE DISECTION;  Surgeon: [name], MD;  Location: [place];  Service: ENT;  Laterality: Right;  All documentation done by R.Ward after [phone] --done under G. Garzon's password  . RE-EXCISION OF BREAST LUMPECTOMY  2005   right   Social History:   reports that she has never smoked. She quit smokeless tobacco use about 3 years ago.  Her smokeless  tobacco use included chew. She reports previous alcohol use. She reports that she does not use drugs.  Family History  Problem Relation Age of Onset  . Heart disease Mother        MI  . Cancer Father        bone  . Thyroid disease Neg Hx   . Hypercalcemia Neg Hx   . Colon cancer Neg Hx   . Stomach cancer Neg Hx   . Rectal cancer Neg Hx   . Esophageal cancer Neg Hx     Medications: Patient's  Medications  New Prescriptions   No medications on file  Previous Medications   ACETAMINOPHEN (TYLENOL) 500 MG TABLET    Take 2 tablets (1,000 mg total) by mouth every 6 (six) hours as needed.   ASCORBIC ACID (VITAMIN C WITH ROSE HIPS) 500 MG TABLET    Take 1 tablet (500 mg total) by mouth daily for 14 days.   ASPIRIN EC 81 MG EC TABLET    Take 1 tablet (81 mg total) by mouth daily. Swallow whole.   ATORVASTATIN (LIPITOR) 80 MG TABLET    Take 1 tablet (80 mg total) by mouth daily.   CARVEDILOL (COREG) 6.25 MG TABLET    Take 6.25 mg by mouth daily.   HYDROCHLOROTHIAZIDE (HYDRODIURIL) 12.5 MG TABLET    Take 1 tablet (12.5 mg total) by mouth daily.   LOSARTAN (COZAAR) 100 MG TABLET    Take 1 tablet (100 mg total) by mouth daily.   SERTRALINE (ZOLOFT) 50 MG TABLET    Take 50 mg by mouth daily.   ZINC GLUCONATE 50 MG TABLET    Take 1 tablet (50 mg total) by mouth daily for 14 days.  Modified Medications   No medications on file  Discontinued Medications   CHOLECALCIFEROL (VITAMIN D) 50 MCG (2000 UT) TABLET    Take 2,000 Units by mouth daily.   HYDROCODONE-ACETAMINOPHEN (NORCO/VICODIN) 5-325 MG TABLET    Take 1 tablet by mouth every 6 (six) hours as needed for moderate pain.   MELOXICAM (MOBIC) 15 MG TABLET    Take 1 tablet (15 mg total) by mouth daily.   TRAZODONE (DESYREL) 50 MG TABLET    Take 1 tablet (50 mg total) by mouth at bedtime as needed for sleep.    Physical Exam:  Vitals:   12/20/20 1049  BP: (!) 150/100  Pulse: (!) 54  Temp: (!) 97 F (36.1 C)  TempSrc: Temporal  SpO2: 98%  Weight: 160 lb 12.8 oz (72.9 kg)  Height: 5' (1.524 m)   Body mass index is 31.4 kg/m. Wt Readings from Last 3 Encounters:  12/20/20 160 lb 12.8 oz (72.9 kg)  10/29/20 160 lb 3.2 oz (72.7 kg)  10/22/20 162 lb 3.2 oz (73.6 kg)    Physical Exam Constitutional:      General: She is not in acute distress.    Appearance: She is well-developed and well-nourished. She is not diaphoretic.  HENT:      Head: Normocephalic and atraumatic.     Mouth/Throat:     Mouth: Oropharynx is clear and moist.     Pharynx: No oropharyngeal exudate.  Eyes:     Conjunctiva/sclera: Conjunctivae normal.     Pupils: Pupils are equal, round, and reactive to light.  Cardiovascular:     Rate and Rhythm: Normal rate and regular rhythm.     Heart sounds: Normal heart sounds.  Pulmonary:     Effort: Pulmonary effort is normal.  Breath sounds: Normal breath sounds.  Abdominal:     General: Bowel sounds are normal.     Palpations: Abdomen is soft.  Musculoskeletal:        General: No tenderness or edema.     Cervical back: Normal range of motion and neck supple.  Skin:    General: Skin is warm and dry.  Neurological:     Mental Status: She is alert and oriented to person, place, and time.  Psychiatric:        Mood and Affect: Mood and affect and mood normal.        Behavior: Behavior normal.     Labs reviewed: Basic Metabolic Panel: Recent Labs    05/28/20 1201 06/28/20 1331 07/10/20 1019  NA 139 139 141  K 4.0 4.0 4.1  CL 105 108 107  CO2 26 26 28   GLUCOSE 102* 116* 98  BUN 15 20 13   CREATININE 0.86 1.05* 0.89  CALCIUM 10.2 10.8* 10.8*   Liver Function Tests: Recent Labs    01/15/20 1034 07/10/20 1019  AST 13 15  ALT 7 10  BILITOT 0.5 0.4  PROT 6.5 6.7   No results for input(s): LIPASE, AMYLASE in the last 8760 hours. No results for input(s): AMMONIA in the last 8760 hours. CBC: Recent Labs    01/15/20 1034 05/28/20 1201 07/10/20 1019  WBC 5.7 5.9 6.8  NEUTROABS 2,879 3.0 3,556  HGB 13.0 14.4 13.2  HCT 39.0 45.3 40.8  MCV 86.3 90.1 88.5  PLT 311 322 289   Lipid Panel: Recent Labs    01/15/20 1034 05/29/20 0543 07/10/20 1019  CHOL 204* 211* 119  HDL 46* 44 43*  LDLCALC 138* 145* 58  TRIG 97 111 95  CHOLHDL 4.4 4.8 2.8   TSH: No results for input(s): TSH in the last 8760 hours. A1C: Lab Results  Component Value Date   HGBA1C 5.6 05/29/2020      Assessment/Plan 1. Essential hypertension, benign Elevated today, she has not taken her medication but reports when she takes at home and monitors bp at goal <140/90. Encouraged medication compliance with dietary modifications.  - COMPLETE METABOLIC PANEL WITH GFR - CBC with Differential/Platelet  2. Hyperlipidemia LDL goal <70 LDL at goal in aug. Continues on statin with dietary modifications encouraged.  - COMPLETE METABOLIC PANEL WITH GFR  3. Need for hepatitis C screening test - Hepatitis C antibody  4. Old cerebrovascular accident (CVA) without late effect Doing well without adverse effects from CVA. Continues on asa and plavix. Stressed importance of proper control of blood pressure and lipids and taking medication as prescribed.  - CBC with Differential/Platelet  5. Depression, major, in remission [facility]) Doing well on zoloft,in remission.  6. Anxiety Stable on zoloft.  7. Aortic atherosclerosis ([place]) -noted on imaging, continues on asa and statin  8. Need for influenza vaccination - Flu Vaccine QUAD High Dose(Fluad)  Next appt: 63months.  Carlos American. [place], [facility] [phone]

## 2020-12-20 NOTE — Patient Instructions (Addendum)
To get COVID booster in 6 weeks due to recent infection  Make sure to take blood pressure medication prior to appt.

## 2020-12-24 LAB — COMPLETE METABOLIC PANEL WITH GFR
AG Ratio: 1.3 (calc) (ref 1.0–2.5)
ALT: 8 U/L (ref 6–29)
AST: 14 U/L (ref 10–35)
Albumin: 3.6 g/dL (ref 3.6–5.1)
Alkaline phosphatase (APISO): 85 U/L (ref 37–153)
BUN: 18 mg/dL (ref 7–25)
CO2: 22 mmol/L (ref 20–32)
Calcium: 10.6 mg/dL — ABNORMAL HIGH (ref 8.6–10.4)
Chloride: 106 mmol/L (ref 98–110)
Creat: 0.92 mg/dL (ref 0.60–0.93)
GFR, Est African American: 69 mL/min/{1.73_m2} (ref >=60)
GFR, Est Non African American: 59 mL/min/{1.73_m2} — ABNORMAL LOW (ref >=60)
Globulin: 2.8 g/dL (calc) (ref 1.9–3.7)
Glucose, Bld: 81 mg/dL (ref 65–99)
Potassium: 4.5 mmol/L (ref 3.5–5.3)
Sodium: 137 mmol/L (ref 135–146)
Total Bilirubin: 0.5 mg/dL (ref 0.2–1.2)
Total Protein: 6.4 g/dL (ref 6.1–8.1)

## 2020-12-24 LAB — CBC WITH DIFFERENTIAL/PLATELET
Absolute Monocytes: 475 cells/uL (ref 200–950)
Basophils Absolute: 78 cells/uL (ref 0–200)
Basophils Relative: 1.6 %
Eosinophils Absolute: 127 cells/uL (ref 15–500)
Eosinophils Relative: 2.6 %
HCT: 39 % (ref 35.0–45.0)
Hemoglobin: 12.9 g/dL (ref 11.7–15.5)
Lymphs Abs: 2038 cells/uL (ref 850–3900)
MCH: 28.9 pg (ref 27.0–33.0)
MCHC: 33.1 g/dL (ref 32.0–36.0)
MCV: 87.4 fL (ref 80.0–100.0)
MPV: 12 fL (ref 7.5–12.5)
Monocytes Relative: 9.7 %
Neutro Abs: 2181 cells/uL (ref 1500–7800)
Neutrophils Relative %: 44.5 %
Platelets: 362 10*3/uL (ref 140–400)
RBC: 4.46 10*6/uL (ref 3.80–5.10)
RDW: 12.3 % (ref 11.0–15.0)
Total Lymphocyte: 41.6 %
WBC: 4.9 10*3/uL (ref 3.8–10.8)

## 2020-12-24 LAB — HEPATITIS C ANTIBODY
Hepatitis C Ab: NONREACTIVE
SIGNAL TO CUT-OFF: 0.01 (ref <1.00)

## 2021-02-26 ENCOUNTER — Telehealth: Payer: Self-pay

## 2021-02-26 NOTE — Telephone Encounter (Signed)
Please have her make appt- can be virtual visit

## 2021-02-26 NOTE — Telephone Encounter (Signed)
Patient would like to discuss with you only about a sexual concern. She would prefer you call her personally to [phone]

## 2021-02-27 ENCOUNTER — Telehealth: Payer: Self-pay

## 2021-02-27 NOTE — Telephone Encounter (Signed)
Confirmed apnt has been made.

## 2021-02-27 NOTE — Telephone Encounter (Signed)
Erin Chandler, NP to Erin Cunningham, [place]    02/26/21 2:15 PM Note Please have her make appt- can be virtual visit           02/26/21 1:33 PM Erin Cunningham, CMA routed this conversation to Erin Chandler, NP  Erin Cunningham, [place]   02/26/21 1:32 PM Note Patient would like to discuss with you only about a sexual concern. She would prefer you call her personally to [phone]       Patient stated that she is getting the run around, stated that she has called to speak with specific people and they won't speak with her.  Patient stated that she does NOT want a Virtual Visit because it is hard to get our of class at school.  Scheduled an appointment with Dinah for 3/29

## 2021-02-27 NOTE — Telephone Encounter (Signed)
-----   Message from Erin Cunningham sent at 02/27/2021  9:52 AM EDT ----- Regarding: Call Request Ms. Leisure would like for you to give her a call. She said she called earlier in the week wanting to speak with Janett Billow and she has not heard anything back from her, so now she would like you to call her. Idk what for.  [employer]

## 2021-02-27 NOTE — Telephone Encounter (Signed)
Messages such as this should ALWAYS be sent to clinical intake

## 2021-03-04 ENCOUNTER — Other Ambulatory Visit: Payer: Self-pay

## 2021-03-04 ENCOUNTER — Ambulatory Visit (INDEPENDENT_AMBULATORY_CARE_PROVIDER_SITE_OTHER): Payer: BC Managed Care – PPO | Admitting: Family

## 2021-03-04 ENCOUNTER — Encounter: Payer: Self-pay | Admitting: Family

## 2021-03-04 VITALS — BP 120/70 | HR 71 | Temp 97.1°F | Resp 16 | Ht 60.0 in | Wt 161.0 lb

## 2021-03-04 DIAGNOSIS — I10 Essential (primary) hypertension: Secondary | ICD-10-CM | POA: Diagnosis not present

## 2021-03-04 DIAGNOSIS — N951 Menopausal and female climacteric states: Secondary | ICD-10-CM

## 2021-03-04 MED ORDER — K-Y JELLY EX GEL: 1.0000 "application " | Freq: Every day | CUTANEOUS | 5 refills | Status: DC | PRN

## 2021-03-04 MED ORDER — CARVEDILOL 6.25 MG PO TABS: 6.2500 mg | ORAL_TABLET | Freq: Every day | ORAL | 1 refills | Status: DC

## 2021-03-04 NOTE — Patient Instructions (Signed)
Atrophic Vaginitis  Atrophic vaginitis is when the lining of the vagina becomes dry and thin. This is most common in women who have stopped having their periods (are in menopause). It usually starts when a woman is 45 to 79 years old. What are the causes? This condition is caused by a drop in a female hormone (estrogen). What increases the risk? You are more likely to develop this condition if:  You take certain medicines.  You have had your ovaries taken out.  You are being treated for cancer.  You have given birth or are breastfeeding.  You are more than 79 years old.  You smoke. What are the signs or symptoms?  Pain during sex.  A feeling of pressure during sex.  Bleeding during sex.  Burning or itching in the vagina.  Burning pain when you pee (urinate).  Fluid coming from your vagina. Some people do not have symptoms. How is this treated?  Using a lubricant before sex.  Using a moisturizer in the vagina.  Using estrogen in the vagina. In some cases, you may not need treatment. Follow these instructions at home: Medicines  Take all medicines only as told by your doctor. This includes medicines for dryness.  Do not use herbal medicines unless your doctor says it is okay. General instructions  Talk with your doctor about treatment.  Do not douche.  Do not use scented: ? Sprays. ? Tampons. ? Soaps.  If sex hurts, try using lubricants right before you have sex. Contact a doctor if:  You have fluid coming from the vagina that is not like normal.  You have a bad smell coming from your vagina.  You have new symptoms.  Your symptoms do not get better when treated.  Your symptoms get worse. Summary  This condition happens when the lining of the vagina becomes dry and thin.  It is most common in women who no longer have periods.  Treatment may include using medicines for dryness.  Call a doctor if your symptoms do not get better. This  information is not intended to replace advice given to you by your health care provider. Make sure you discuss any questions you have with your health care provider. Document Revised: 05/23/2020 Document Reviewed: 05/23/2020 Elsevier Patient Education  2021 Elsevier Inc.   

## 2021-03-04 NOTE — Progress Notes (Signed)
Provider: Loghan Kurtzman FNP-C  Lauree Chandler, NP  Patient Care Team: Lauree Chandler, NP as PCP - General (Geriatric Medicine) [name], MD as Consulting Physician (Otolaryngology)  Extended Emergency Contact Information Primary Emergency Contact: Heatwole,[name] Address: [address]          [place], [place] [address] [name] of [place] Phone: [phone] Mobile Phone: [phone] Relation: Spouse Secondary Emergency Contact: [place] Phone: [phone] Mobile Phone: [phone] Relation: Son  Code Status:  Full code  Goals of care: Advanced Directive information Advanced Directives 03/04/2021  Does Patient Have a Medical Advance Directive? No  Would patient like information on creating a medical advance directive? No - Patient declined  Some encounter information is confidential and restricted. Go to Review Flowsheets activity to see all data.     Chief Complaint  Patient presents with  . Acute Visit    Patient wants to discuss personal concerns.     HPI:  Pt is a 80 y.o. female seen today for an acute visit to discuss sexual concern.States feels embarrassed to talk about being sexually active at her age.Patient reassured okay to talking about being sexually active with husband no age limit.she complains of vaginal dryness and pain during sexual activity.she denies any bleeding.states was prescribed vaginal estrogen Cream vaginal application in the past but did not use it after she read side effects and warning not to use if person with breast cancer.she denies any fever,chills ,abdominal pai or symptoms of urinary tract infections. She request coreg to be refilled.   Past Medical History:  Diagnosis Date  . Breast CA ([place]) 2006   right [facility])  . Cancer ([place])   . Helicobacter pylori gastritis 2001  . High blood pressure   . History of adenomatous polyp of colon 09/23/2017   Oma 09/25/2017 and apparently  polyps in the past as well though recalled age  . History of shingles   . Major depressive disorder 05/28/2020  . Major depressive disorder, recurrent, severe without psychotic features ([place]) 06/24/2015  . Parotid mass 2014   right benign cyst  . Personal history of radiation therapy 2006   Rt breast  . Stroke (cerebrum) ([place]) 06/28/2020   Per patient  . Wears glasses    Past Surgical History:  Procedure Laterality Date  . ABDOMINAL HYSTERECTOMY  1982   BSO  . BREAST LUMPECTOMY Right 2007  . BREAST LUMPECTOMY WITH SENTINEL LYMPH NODE BIOPSY  2005   right  . BREAST SURGERY  2004   rt br mass  . CESAREAN SECTION    . COLONOSCOPY    . ESOPHAGOGASTRODUODENOSCOPY  2001   H pylori gastritis  . EYE SURGERY     both cataracts  . PAROTIDECTOMY Right 09/05/2013   Procedure: RIGHT SUPERFICIAL PAROTIDECTOMY WITH FACIAL NERVE DISECTION;  Surgeon: [name], MD;  Location: [place];  Service: ENT;  Laterality: Right;  All documentation done by R.Ward after [phone] --done under G. Garzon's password  . RE-EXCISION OF BREAST LUMPECTOMY  2005   right    No Known Allergies  Outpatient Encounter Medications as of 03/04/2021  Medication Sig  . acetaminophen (TYLENOL) 500 MG tablet Take 2 tablets (1,000 mg total) by mouth every 6 (six) hours as needed.  Marland Kitchen aspirin EC 81 MG EC tablet Take 1 tablet (81 mg total) by mouth daily. Swallow whole.  Marland Kitchen atorvastatin (LIPITOR) 80 MG tablet Take 1 tablet (80 mg total) by mouth daily.  . hydrochlorothiazide (  HYDRODIURIL) 12.5 MG tablet Take 1 tablet (12.5 mg total) by mouth daily.  Marland Kitchen losartan (COZAAR) 100 MG tablet Take 1 tablet (100 mg total) by mouth daily.  . sertraline (ZOLOFT) 50 MG tablet Take 50 mg by mouth daily.  . [DISCONTINUED] carvedilol (COREG) 6.25 MG tablet Take 6.25 mg by mouth daily.  . carvedilol (COREG) 6.25 MG tablet Take 1 tablet (6.25 mg total) by mouth daily.   No facility-administered encounter medications on  file as of 03/04/2021.    Review of Systems  Constitutional: Negative for appetite change, chills, fatigue and fever.  Respiratory: Negative for cough, chest tightness, shortness of breath and wheezing.   Cardiovascular: Negative for chest pain, palpitations and leg swelling.  Gastrointestinal: Negative for abdominal distention, abdominal pain, constipation, diarrhea, nausea and vomiting.  Genitourinary: Negative for difficulty urinating, dysuria, flank pain, frequency and urgency.    Immunization History  Administered Date(s) Administered  . Fluad Quad(high Dose 65+) 01/15/2020, 12/20/2020  . Influenza, High Dose Seasonal PF 09/14/2018  . Influenza,inj,Quad PF,6+ Mos 11/06/2015  . PFIZER(Purple Top)SARS-COV-2 Vaccination 02/03/2020, 02/24/2020  . PPD Test 12/10/2015  . Pneumococcal Conjugate-13 01/16/2015  . Pneumococcal Polysaccharide-23 05/12/2017  . Tdap 06/15/2018   Pertinent  Health Maintenance Due  Topic Date Due  . INFLUENZA VACCINE  Completed  . DEXA SCAN  Completed   Fall Risk  03/04/2021 12/10/2020 10/29/2020 10/22/2020 10/01/2020  Falls in the past year? 0 0 0 0 0  Number falls in past yr: 0 0 0 0 0  Injury with Fall? 0 0 0 0 0   Functional Status Survey:    Vitals:   03/04/21 1134  BP: 120/70  Pulse: 71  Resp: 16  Temp: (!) 97.1 F (36.2 C)  SpO2: 98%  Weight: 161 lb (73 kg)  Height: 5' (1.524 m)   Body mass index is 31.44 kg/m. Physical Exam Vitals reviewed.  Constitutional:      General: She is not in acute distress.    Appearance: She is not ill-appearing.  Cardiovascular:     Rate and Rhythm: Normal rate and regular rhythm.     Pulses: Normal pulses.     Heart sounds: Normal heart sounds. No murmur heard. No friction rub. No gallop.   Pulmonary:     Effort: Pulmonary effort is normal. No respiratory distress.     Breath sounds: Normal breath sounds. No wheezing, rhonchi or rales.  Chest:     Chest wall: No tenderness.  Abdominal:      General: Bowel sounds are normal. There is no distension.     Palpations: Abdomen is soft. There is no mass.     Tenderness: There is no abdominal tenderness. There is no right CVA tenderness, left CVA tenderness, guarding or rebound.  Genitourinary:    Comments: Pelvic exam deferred Musculoskeletal:        General: No swelling or tenderness. Normal range of motion.     Right lower leg: No edema.     Left lower leg: No edema.  Neurological:     Mental Status: She is alert.     Labs reviewed: Recent Labs    06/28/20 1331 07/10/20 1019 12/20/20 0000  NA 139 141 137  K 4.0 4.1 4.5  CL 108 107 106  CO2 26 28 22   GLUCOSE 116* 98 81  BUN 20 13 18   CREATININE 1.05* 0.89 0.92  CALCIUM 10.8* 10.8* 10.6*   Recent Labs    07/10/20 1019 12/20/20 0000  AST 15 14  ALT 10 8  BILITOT 0.4 0.5  PROT 6.7 6.4   Recent Labs    05/28/20 1201 07/10/20 1019 12/20/20 0000  WBC 5.9 6.8 4.9  NEUTROABS 3.0 3,556 2,181  HGB 14.4 13.2 12.9  HCT 45.3 40.8 39.0  MCV 90.1 88.5 87.4  PLT 322 289 362   Lab Results  Component Value Date   TSH 1.09 06/13/2018   Lab Results  Component Value Date   HGBA1C 5.6 05/29/2020   Lab Results  Component Value Date   CHOL 119 07/10/2020   HDL 43 (L) 07/10/2020   LDLCALC 58 07/10/2020   TRIG 95 07/10/2020   CHOLHDL 2.8 07/10/2020    Significant Diagnostic Results in last 30 days:  No results found.  Assessment/Plan  1. Essential hypertension, benign B/p well controlled. Continue current medication.  - carvedilol (COREG) 6.25 MG tablet; Take 1 tablet (6.25 mg total) by mouth daily.  Dispense: 90 tablet; Refill: 1  2. Vaginal dryness, menopausal Reports vaginal dryness and pain with sexual activity.Has hx of breast cancer unable to use previous prescribed estrogen gel  By PCP. Advised to use K-Y jelly as needed during sexual activity.  - Lubricants (K-Y JELLY) GEL; Apply 1 application topically daily as needed. To vaginal area  Dispense:  113 g; Refill: 5  Family/ staff Communication: Reviewed plan of care with patient verbalized understanding  Labs/tests ordered: None   Next Appointment: As needed if symptoms worsen or fail to improve.   Sandrea Hughs, NP

## 2021-03-11 ENCOUNTER — Other Ambulatory Visit (HOSPITAL_COMMUNITY): Payer: Self-pay | Admitting: Psychiatry

## 2021-03-27 ENCOUNTER — Telehealth (INDEPENDENT_AMBULATORY_CARE_PROVIDER_SITE_OTHER): Payer: BC Managed Care – PPO | Admitting: Psychiatry

## 2021-03-27 ENCOUNTER — Encounter (HOSPITAL_COMMUNITY): Payer: Self-pay | Admitting: Psychiatry

## 2021-03-27 ENCOUNTER — Other Ambulatory Visit: Payer: Self-pay

## 2021-03-27 VITALS — Wt 161.0 lb

## 2021-03-27 DIAGNOSIS — F419 Anxiety disorder, unspecified: Secondary | ICD-10-CM

## 2021-03-27 DIAGNOSIS — F332 Major depressive disorder, recurrent severe without psychotic features: Secondary | ICD-10-CM

## 2021-03-27 MED ORDER — SERTRALINE HCL 50 MG PO TABS: 75.0000 mg | ORAL_TABLET | Freq: Every day | ORAL | 0 refills | Status: DC

## 2021-03-27 NOTE — Progress Notes (Signed)
Virtual Visit via Telephone Note  I connected with Erin Cunningham on 03/27/21 at 11:00 AM EDT by telephone and verified that I am speaking with the correct person using two identifiers.  Location: Patient: In Car Provider: Home Office   I discussed the limitations, risks, security and privacy concerns of performing an evaluation and management service by telephone and the availability of in person appointments. I also discussed with the patient that there may be a patient responsible charge related to this service. The patient expressed understanding and agreed to proceed.   History of Present Illness: Patient is evaluated by phone session.  She was last evaluated more than a year ago.  Patient admitted that she was doing fine and started to cut down the medication on her own.  She was taking Zoloft 2-3 times a week until she completely ran out a few weeks ago.  She realized that she was doing mistake as she noticed increase depression, anxiety, crying spells and poor sleep.  She is frustrated because she is dealing with her 65 year old husband who has a stroke and sometime she feels he is a child.  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  She feels that if she do not work then she will not able to pay bills.  She had a husband who lives in [place] who does come and visit her.  She reported her energy level is low and she like to go back on Zoloft.  She does not want trazodone because she felt the Zoloft alone was helping her sleep, anxiety and depression.  She denies any paranoia, hallucination, suicidal thoughts.  She endorsed lately feels that she is trapped because she cannot enjoy herself and feel very isolated.  We have recommended therapy in the past but at that time patient did not want.  Now she is considering but like to have restart medicine first.  Patient denies any mania, aggression or violence.     Past Psychiatric History:Reviewed H/Odepression and anxiety. One inpatient [facility] forsuicidal thoughts. H/O brief stay in OBS unit in 2016. No h/omania, psychosis, hallucination or suicidal attempt.  No results found for this or any previous visit (from the past 2160 hour(s)).  Psychiatric Specialty Exam: Physical Exam  Review of Systems  Weight 161 lb (73 kg).There is no height or weight on file to calculate BMI.  General Appearance: NA  Eye Contact:  NA  Speech:  Slow  Volume:  Decreased  Mood:  Anxious, Depressed and Irritable  Affect:  NA  Thought Process:  Descriptions of Associations: Intact  Orientation:  Full (Time, Place, and Person)  Thought Content:  Rumination  Suicidal Thoughts:  No  Homicidal Thoughts:  No  Memory:  Immediate;   Good Recent;   Fair Remote;   Fair  Judgement:  Fair  Insight:  Shallow  Psychomotor Activity:  NA  Concentration:  Concentration: Fair and Attention Span: Fair  Recall:  [employer] of Knowledge:  Good  Language:  Good  Akathisia:  No  Handed:  Right  AIMS (if indicated):     Assets:  Communication Skills Desire for Improvement Housing Resilience Transportation  ADL's:  Intact  Cognition:  WNL  Sleep:   fair      Assessment and Plan: Major depressive disorder, recurrent.  Anxiety.  Patient was doing well on Zoloft 75 mg until she felt that she is doing  better and started to cut down and taking 2-3 times a week until she completely ran out.  We discussed risk of relapse without medication.  She acknowledged and agreed to go back on Zoloft 75 mg daily.  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  I recommend she should consider therapy the patient like to restart medicine first and like to follow-up in 4 weeks if needed therapy.  Discussed medication side effects and benefits.  Discussed safety concerns and anytime having active suicidal thoughts or homicidal  thoughts then she should call us immediately.  Follow-up in 4 weeks.  Follow Up Instructions:    I discussed the assessment and treatment plan with the patient. The patient was provided an opportunity to ask questions and all were answered. The patient agreed with the plan and demonstrated an understanding of the instructions.   The patient was advised to call back or seek an in-person evaluation if the symptoms worsen or if the condition fails to improve as anticipated.  I provided 27 minutes of non-face-to-face time during this encounter.   [name], MD

## 2021-04-21 ENCOUNTER — Ambulatory Visit (INDEPENDENT_AMBULATORY_CARE_PROVIDER_SITE_OTHER): Payer: BC Managed Care – PPO | Admitting: Nurse Practitioner

## 2021-04-21 ENCOUNTER — Other Ambulatory Visit: Payer: Self-pay

## 2021-04-21 ENCOUNTER — Encounter: Payer: Self-pay | Admitting: Nurse Practitioner

## 2021-04-21 VITALS — BP 142/80 | HR 58 | Temp 96.9°F | Ht 60.0 in | Wt 160.8 lb

## 2021-04-21 DIAGNOSIS — E785 Hyperlipidemia, unspecified: Secondary | ICD-10-CM | POA: Diagnosis not present

## 2021-04-21 DIAGNOSIS — E6609 Other obesity due to excess calories: Secondary | ICD-10-CM

## 2021-04-21 DIAGNOSIS — I700 Atherosclerosis of aorta: Secondary | ICD-10-CM

## 2021-04-21 DIAGNOSIS — N951 Menopausal and female climacteric states: Secondary | ICD-10-CM

## 2021-04-21 DIAGNOSIS — Z6831 Body mass index (BMI) 31.0-31.9, adult: Secondary | ICD-10-CM

## 2021-04-21 DIAGNOSIS — F325 Major depressive disorder, single episode, in full remission: Secondary | ICD-10-CM

## 2021-04-21 DIAGNOSIS — Z8673 Personal history of transient ischemic attack (TIA), and cerebral infarction without residual deficits: Secondary | ICD-10-CM

## 2021-04-21 DIAGNOSIS — F419 Anxiety disorder, unspecified: Secondary | ICD-10-CM

## 2021-04-21 DIAGNOSIS — I10 Essential (primary) hypertension: Secondary | ICD-10-CM

## 2021-04-21 MED ORDER — CARVEDILOL 6.25 MG PO TABS: 6.2500 mg | ORAL_TABLET | Freq: Two times a day (BID) | ORAL | 1 refills | Status: DC

## 2021-04-21 NOTE — Patient Instructions (Addendum)
Restart Lipitor  Increase carvedilol to twice daily   Take blood pressure 1 hour after medication- make sure you have been sitting for at least 2 minutes before you check   [email or web address]">  DASH Eating Plan DASH stands for Dietary Approaches to Stop Hypertension. The DASH eating plan is a healthy eating plan that has been shown to:  Reduce high blood pressure (hypertension).  Reduce your risk for type 2 diabetes, heart disease, and stroke.  Help with weight loss. What are tips for following this plan? Reading food labels  Check food labels for the amount of salt (sodium) per serving. Choose foods with less than 5 percent of the Daily Value of sodium. Generally, foods with less than 300 milligrams (mg) of sodium per serving fit into this eating plan.  To find whole grains, look for the word "whole" as the first word in the ingredient list. Shopping  Buy products labeled as "low-sodium" or "no salt added."  Buy fresh foods. Avoid canned foods and pre-made or frozen meals. Cooking  Avoid adding salt when cooking. Use salt-free seasonings or herbs instead of table salt or sea salt. Check with your health care provider or pharmacist before using salt substitutes.  Do not fry foods. Cook foods using healthy methods such as baking, boiling, grilling, roasting, and broiling instead.  Cook with heart-healthy oils, such as olive, canola, avocado, soybean, or sunflower oil. Meal planning  Eat a balanced diet that includes: ? 4 or more servings of fruits and 4 or more servings of vegetables each day. Try to fill one-half of your plate with fruits and vegetables. ? 6-8 servings of whole grains each day. ? Less than 6 oz (170 g) of lean meat, poultry, or fish each day. A 3-oz (85-g) serving of meat is about the same size as a deck of cards. One egg equals 1 oz (28 g). ? 2-3 servings of low-fat dairy each day. One serving is 1 cup (237  mL). ? 1 serving of nuts, seeds, or beans 5 times each week. ? 2-3 servings of heart-healthy fats. Healthy fats called omega-3 fatty acids are found in foods such as walnuts, flaxseeds, fortified milks, and eggs. These fats are also found in cold-water fish, such as sardines, salmon, and mackerel.  Limit how much you eat of: ? Canned or prepackaged foods. ? Food that is high in trans fat, such as some fried foods. ? Food that is high in saturated fat, such as fatty meat. ? Desserts and other sweets, sugary drinks, and other foods with added sugar. ? Full-fat dairy products.  Do not salt foods before eating.  Do not eat more than 4 egg yolks a week.  Try to eat at least 2 vegetarian meals a week.  Eat more home-cooked food and less restaurant, buffet, and fast food.   Lifestyle  When eating at a restaurant, ask that your food be prepared with less salt or no salt, if possible.  If you drink alcohol: ? Limit how much you use to:  0-1 drink a day for women who are not pregnant.  0-2 drinks a day for men. ? Be aware of how much alcohol is in your drink. In the [place] one drink equals one 12 oz bottle of beer (355 mL), one 5 oz glass of wine (148 mL), or one 1 oz glass of hard liquor (44 mL). General information  Avoid eating more than 2,300 mg of salt a day. If you have hypertension, you may need to  reduce your sodium intake to 1,500 mg a day.  Work with your health care provider to maintain a healthy body weight or to lose weight. Ask what an ideal weight is for you.  Get at least 30 minutes of exercise that causes your heart to beat faster (aerobic exercise) most days of the week. Activities may include walking, swimming, or biking.  Work with your health care provider or dietitian to adjust your eating plan to your individual calorie needs. What foods should I eat? Fruits All fresh, dried, or frozen fruit. Canned fruit in natural juice (without added sugar). Vegetables Fresh  or frozen vegetables (raw, steamed, roasted, or grilled). Low-sodium or reduced-sodium tomato and vegetable juice. Low-sodium or reduced-sodium tomato sauce and tomato paste. Low-sodium or reduced-sodium canned vegetables. Grains Whole-grain or whole-wheat bread. Whole-grain or whole-wheat pasta. Brown rice. [place] Morrow. Bulgur. Whole-grain and low-sodium cereals. Pita bread. Low-fat, low-sodium crackers. Whole-wheat flour tortillas. Meats and other proteins Skinless chicken or [place]. Ground chicken or [place]. Pork with fat trimmed off. Fish and seafood. Egg whites. Dried beans, peas, or lentils. Unsalted nuts, nut butters, and seeds. Unsalted canned beans. Lean cuts of beef with fat trimmed off. Low-sodium, lean precooked or cured meat, such as sausages or meat loaves. Dairy Low-fat (1%) or fat-free (skim) milk. Reduced-fat, low-fat, or fat-free cheeses. Nonfat, low-sodium ricotta or cottage cheese. Low-fat or nonfat yogurt. Low-fat, low-sodium cheese. Fats and oils Soft margarine without trans fats. Vegetable oil. Reduced-fat, low-fat, or light mayonnaise and salad dressings (reduced-sodium). Canola, safflower, olive, avocado, soybean, and sunflower oils. Avocado. Seasonings and condiments Herbs. Spices. Seasoning mixes without salt. Other foods Unsalted popcorn and pretzels. Fat-free sweets. The items listed above may not be a complete list of foods and beverages you can eat. Contact a dietitian for more information. What foods should I avoid? Fruits Canned fruit in a light or heavy syrup. Fried fruit. Fruit in cream or butter sauce. Vegetables Creamed or fried vegetables. Vegetables in a cheese sauce. Regular canned vegetables (not low-sodium or reduced-sodium). Regular canned tomato sauce and paste (not low-sodium or reduced-sodium). Regular tomato and vegetable juice (not low-sodium or reduced-sodium). Angie Fava. Olives. Grains Baked goods made with fat, such as croissants, muffins, or  some breads. Dry pasta or rice meal packs. Meats and other proteins Fatty cuts of meat. Ribs. Fried meat. Berniece Salines. Bologna, salami, and other precooked or cured meats, such as sausages or meat loaves. Fat from the back of a pig (fatback). Bratwurst. Salted nuts and seeds. Canned beans with added salt. Canned or smoked fish. Whole eggs or egg yolks. Chicken or [place] with skin. Dairy Whole or 2% milk, cream, and half-and-half. Whole or full-fat cream cheese. Whole-fat or sweetened yogurt. Full-fat cheese. Nondairy creamers. Whipped toppings. Processed cheese and cheese spreads. Fats and oils Butter. Stick margarine. Lard. Shortening. Ghee. Bacon fat. Tropical oils, such as coconut, palm kernel, or palm oil. Seasonings and condiments Onion salt, garlic salt, seasoned salt, table salt, and sea salt. Worcestershire sauce. Tartar sauce. Barbecue sauce. Teriyaki sauce. Soy sauce, including reduced-sodium. Steak sauce. Canned and packaged gravies. Fish sauce. Oyster sauce. Cocktail sauce. Store-bought horseradish. Ketchup. Mustard. Meat flavorings and tenderizers. Bouillon cubes. Hot sauces. Pre-made or packaged marinades. Pre-made or packaged taco seasonings. Relishes. Regular salad dressings. Other foods Salted popcorn and pretzels. The items listed above may not be a complete list of foods and beverages you should avoid. Contact a dietitian for more information. Where to find more information  National Heart, Lung, and Blood Institute: https://wilson-eaton.com/  American Heart Association: www.heart.org  Academy of Nutrition and Dietetics: www.eatright.Revloc: www.kidney.org Summary  The DASH eating plan is a healthy eating plan that has been shown to reduce high blood pressure (hypertension). It may also reduce your risk for type 2 diabetes, heart disease, and stroke.  When on the DASH eating plan, aim to eat more fresh fruits and vegetables, whole grains, lean proteins, low-fat  dairy, and heart-healthy fats.  With the DASH eating plan, you should limit salt (sodium) intake to 2,300 mg a day. If you have hypertension, you may need to reduce your sodium intake to 1,500 mg a day.  Work with your health care provider or dietitian to adjust your eating plan to your individual calorie needs. This information is not intended to replace advice given to you by your health care provider. Make sure you discuss any questions you have with your health care provider. Document Revised: 10/27/2019 Document Reviewed: 10/27/2019 Elsevier Patient Education  2021 [employer].

## 2021-04-21 NOTE — Progress Notes (Signed)
Careteam: Patient Care Team: Lauree Chandler, NP as PCP - General (Geriatric Medicine) [name], MD as Consulting Physician (Otolaryngology)  PLACE OF SERVICE:  [place] Directive information Does Patient Have a Medical Advance Directive?: No, Would patient like information on creating a medical advance directive?: No - Patient declined  No Known Allergies  Chief Complaint  Patient presents with  . Medical Management of Chronic Issues    4 month follow-up      HPI: Patient is a 80 y.o. female for routine follow up.   Had COVID December 22- has not gotten her booster- but thinking about it  Anxiety and depression- she ran out of zoloft and just stopped. She restarted and her whole body got better.   Hyperlipidemia- not taking lipitor. She has had a hx of stroke.   Hx of CVA- not taking lipitor, taking ASA  Vaginal dryness KY jelly called in but [facility] never called her to tell her it was ready  Hypertension- elevated at home but took when she took her medications. Only taking coreg daily.  Review of Systems:  Review of Systems  Constitutional: Negative for chills, fever and weight loss.  HENT: Negative for tinnitus.   Respiratory: Negative for cough, sputum production and shortness of breath.   Cardiovascular: Negative for chest pain, palpitations and leg swelling.  Gastrointestinal: Negative for abdominal pain, constipation, diarrhea and heartburn.  Genitourinary: Negative for dysuria, frequency and urgency.  Musculoskeletal: Negative for back pain, falls, joint pain and myalgias.  Skin: Negative.   Neurological: Negative for dizziness and headaches.  Psychiatric/Behavioral: Negative for depression and memory loss. The patient does not have insomnia.     Past Medical History:  Diagnosis Date  . Breast CA ([place]) 2006   right [facility])  . Cancer ([place])   . Helicobacter pylori gastritis 2001  . High blood pressure   . History of  adenomatous polyp of colon 09/23/2017   [place] 09/25/2017 and apparently polyps in the past as well though recalled age  . History of shingles   . Major depressive disorder 05/28/2020  . Major depressive disorder, recurrent, severe without psychotic features ([place]) 06/24/2015  . Parotid mass 2014   right benign cyst  . Personal history of radiation therapy 2006   Rt breast  . Stroke (cerebrum) ([place]) 06/28/2020   Per patient  . Wears glasses    Past Surgical History:  Procedure Laterality Date  . ABDOMINAL HYSTERECTOMY  1982   BSO  . BREAST LUMPECTOMY Right 2007  . BREAST LUMPECTOMY WITH SENTINEL LYMPH NODE BIOPSY  2005   right  . BREAST SURGERY  2004   rt br mass  . CESAREAN SECTION    . COLONOSCOPY    . ESOPHAGOGASTRODUODENOSCOPY  2001   H pylori gastritis  . EYE SURGERY     both cataracts  . PAROTIDECTOMY Right 09/05/2013   Procedure: RIGHT SUPERFICIAL PAROTIDECTOMY WITH FACIAL NERVE DISECTION;  Surgeon: [name], MD;  Location: [place];  Service: ENT;  Laterality: Right;  All documentation done by R.Ward after [phone] --done under G. Garzon's password  . RE-EXCISION OF BREAST LUMPECTOMY  2005   right   Social History:   reports that she has never smoked. She quit smokeless tobacco use about 3 years ago.  Her smokeless tobacco use included chew. She reports previous alcohol use. She reports that she does not use drugs.  Family History  Problem Relation Age of Onset  .  Heart disease Mother        MI  . Cancer Father        bone  . Thyroid disease Neg Hx   . Hypercalcemia Neg Hx   . Colon cancer Neg Hx   . Stomach cancer Neg Hx   . Rectal cancer Neg Hx   . Esophageal cancer Neg Hx     Medications: Patient's Medications  New Prescriptions   No medications on file  Previous Medications   ACETAMINOPHEN (TYLENOL) 500 MG TABLET    Take 2 tablets (1,000 mg total) by mouth every 6 (six) hours as needed.   ASPIRIN EC 81 MG EC TABLET    Take 1 tablet  (81 mg total) by mouth daily. Swallow whole.   ATORVASTATIN (LIPITOR) 80 MG TABLET    Take 1 tablet (80 mg total) by mouth daily.   CARVEDILOL (COREG) 6.25 MG TABLET    Take 1 tablet (6.25 mg total) by mouth daily.   HYDROCHLOROTHIAZIDE (HYDRODIURIL) 12.5 MG TABLET    Take 1 tablet (12.5 mg total) by mouth daily.   LOSARTAN (COZAAR) 100 MG TABLET    Take 1 tablet (100 mg total) by mouth daily.   LUBRICANTS (K-Y JELLY) GEL    Apply 1 application topically daily as needed. To vaginal area   SERTRALINE (ZOLOFT) 50 MG TABLET    Take 1.5 tablets (75 mg total) by mouth daily.  Modified Medications   No medications on file  Discontinued Medications   No medications on file    Physical Exam:  Vitals:   04/21/21 1113  BP: (!) 142/80  Pulse: (!) 58  Temp: (!) 96.9 F (36.1 C)  TempSrc: Temporal  SpO2: 98%  Weight: 160 lb 12.8 oz (72.9 kg)  Height: 5' (1.524 m)   Body mass index is 31.4 kg/m. Wt Readings from Last 3 Encounters:  04/21/21 160 lb 12.8 oz (72.9 kg)  03/04/21 161 lb (73 kg)  12/20/20 160 lb 12.8 oz (72.9 kg)    Physical Exam Constitutional:      General: She is not in acute distress.    Appearance: She is well-developed. She is not diaphoretic.  HENT:     Head: Normocephalic and atraumatic.     Nose: No congestion.     Mouth/Throat:     Mouth: Mucous membranes are moist.     Pharynx: No oropharyngeal exudate.  Eyes:     Conjunctiva/sclera: Conjunctivae normal.     Pupils: Pupils are equal, round, and reactive to light.  Cardiovascular:     Rate and Rhythm: Normal rate and regular rhythm.     Heart sounds: Normal heart sounds.  Pulmonary:     Effort: Pulmonary effort is normal.     Breath sounds: Normal breath sounds.  Abdominal:     General: Bowel sounds are normal.     Palpations: Abdomen is soft.  Musculoskeletal:        General: No tenderness.     Cervical back: Normal range of motion and neck supple.  Skin:    General: Skin is warm and dry.   Neurological:     Mental Status: She is alert and oriented to person, place, and time. Mental status is at baseline.     Labs reviewed: Basic Metabolic Panel: Recent Labs    06/28/20 1331 07/10/20 1019 12/20/20 0000  NA 139 141 137  K 4.0 4.1 4.5  CL 108 107 106  CO2 26 28 22   GLUCOSE 116* 98 81  BUN 20 13 18   CREATININE 1.05* 0.89 0.92  CALCIUM 10.8* 10.8* 10.6*   Liver Function Tests: Recent Labs    07/10/20 1019 12/20/20 0000  AST 15 14  ALT 10 8  BILITOT 0.4 0.5  PROT 6.7 6.4   No results for input(s): LIPASE, AMYLASE in the last 8760 hours. No results for input(s): AMMONIA in the last 8760 hours. CBC: Recent Labs    05/28/20 1201 07/10/20 1019 12/20/20 0000  WBC 5.9 6.8 4.9  NEUTROABS 3.0 3,556 2,181  HGB 14.4 13.2 12.9  HCT 45.3 40.8 39.0  MCV 90.1 88.5 87.4  PLT 322 289 362   Lipid Panel: Recent Labs    05/29/20 0543 07/10/20 1019  CHOL 211* 119  HDL 44 43*  LDLCALC 145* 58  TRIG 111 95  CHOLHDL 4.8 2.8   TSH: No results for input(s): TSH in the last 8760 hours. A1C: Lab Results  Component Value Date   HGBA1C 5.6 05/29/2020     Assessment/Plan 1. Essential hypertension, benign -not at goal. She should be taking coreg twice daily, may need to make other adjustment in medication. Dash diet given. To follow up in 3 weeks with blood pressure log. To take bp after medication and make sure she has been sitting for 2 mins.  - carvedilol (COREG) 6.25 MG tablet; Take 1 tablet (6.25 mg total) by mouth 2 (two) times daily with a meal.  Dispense: 180 tablet; Refill: 1  2. Hyperlipidemia LDL goal <70 -stressed importance of proper blood pressure and cholesterol control to decrease risk of another CVA. To restart statin at this time and continue with dietary modifications.  - Lipid panel - COMPLETE METABOLIC PANEL WITH GFR  3. Vaginal dryness, menopausal -will try OTC ky jelly for symptom management.   4. Old cerebrovascular accident (CVA)  without late effect Educated on importance of maintaining proper control of blood pressure and cholesterol.   5. Aortic atherosclerosis (HCC) Continues on ASA, educated need to restart statin  6. Depression, major, in remission ([place]) Stable on zoloft.  7. Anxiety Controled on zoloft.   8. Class 1 obesity due to excess calories with serious comorbidity and body mass index (BMI) of 31.0 to 31.9 in adult -discussed dietary modifications and increase in physical activity.   Next appt: 3 weeks for blood pressure follow up. [employer]. [place], [facility] [phone]

## 2021-04-22 LAB — COMPLETE METABOLIC PANEL WITH GFR
AG Ratio: 1.5 (calc) (ref 1.0–2.5)
ALT: 9 U/L (ref 6–29)
AST: 14 U/L (ref 10–35)
Albumin: 3.8 g/dL (ref 3.6–5.1)
Alkaline phosphatase (APISO): 89 U/L (ref 37–153)
BUN: 17 mg/dL (ref 7–25)
CO2: 23 mmol/L (ref 20–32)
Calcium: 10.7 mg/dL — ABNORMAL HIGH (ref 8.6–10.4)
Chloride: 107 mmol/L (ref 98–110)
Creat: 0.92 mg/dL (ref 0.60–0.93)
GFR, Est African American: 69 mL/min/{1.73_m2} (ref >=60)
GFR, Est Non African American: 59 mL/min/{1.73_m2} — ABNORMAL LOW (ref >=60)
Globulin: 2.6 g/dL (calc) (ref 1.9–3.7)
Glucose, Bld: 89 mg/dL (ref 65–99)
Potassium: 4 mmol/L (ref 3.5–5.3)
Sodium: 138 mmol/L (ref 135–146)
Total Bilirubin: 0.5 mg/dL (ref 0.2–1.2)
Total Protein: 6.4 g/dL (ref 6.1–8.1)

## 2021-04-22 LAB — LIPID PANEL
Cholesterol: 189 mg/dL (ref <200)
HDL: 51 mg/dL (ref >=50)
LDL Cholesterol (Calc): 115 mg/dL (calc) — ABNORMAL HIGH
Non-HDL Cholesterol (Calc): 138 mg/dL (calc) — ABNORMAL HIGH (ref <130)
Total CHOL/HDL Ratio: 3.7 (calc) (ref <5.0)
Triglycerides: 122 mg/dL (ref <150)

## 2021-04-22 NOTE — Progress Notes (Deleted)
NEUROLOGY FOLLOW UP OFFICE NOTE  OMAYA NIELAND [id number]  Assessment/Plan:   1.  Left basal ganglia infarct secondary to small vessel disease 2. Hypertension 3.  Hyperlipidemia  1.  Secondary stroke prevention as managed by PCP: -  ASA 81mg  da ily -  Lipitor 80mg  daily.  LDL goal less than 70 -  Blood pressure control.  I told her that she must take all of her blood pressure medications as prescribed because her blood pressure is too elevated which will increase her risk for a recurrent stroke. -  Glycemic control Hgb A1c goal less than 7 2.  Mediterranean diet 3.  Routine exercise 4.  Follow up ***  Subjective:  Erin Cunningham is a 80 year old right-handed female with HTN and history of breast cancer who follows up for stroke.  UPDATE: Current medications:  ASA 81mg , Lipitor 80mg , ***  ***  HISTORY: She was admitted to [facility] on 05/28/2020 for one week history of right leg weakness.  On day of admission, she was at work and didn't feel well.  When she stood up, she started falling over and couldn't walk.  Blood pressure on arrival was 226/95.  CT head was negative for acute abnormality but MRI of brain showed a left basal ganglia infarct.  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  CTA of head and neck showed aortic atherosclerosis but no large vessel occlusion or high-grade stenosis.  2D echo showed EF 65-70% with no source of embolus.  LDL was 145 and Hgb A1c was 5.6.  She was not on any antithrombotics prior to admission and was discharged on ASA 81mg  and Plavix 75mg  daily for 3 weeks followed by ASA 81mg  daily alone.  She was not on statin prior the admission and was discharged on Lipitor 80mg  daily.  Repeat LDL from 07/10/2020 was 58.  Blood pressure has remained elevated.  She reports that she has only been taking one of her three antihypertensive medications because she thought 3 medications were too much.    PAST MEDICAL HISTORY: Past  Medical History:  Diagnosis Date  . Breast CA ([place]) 2006   right [facility])  . Cancer ([place])   . Helicobacter pylori gastritis 2001  . High blood pressure   . History of adenomatous polyp of colon 09/23/2017   Oma 09/25/2017 and apparently polyps in the past as well though recalled age  . History of shingles   . Major depressive disorder 05/28/2020  . Major depressive disorder, recurrent, severe without psychotic features ([place]) 06/24/2015  . Parotid mass 2014   right benign cyst  . Personal history of radiation therapy 2006   Rt breast  . Stroke (cerebrum) ([place]) 06/28/2020   Per patient  . Wears glasses     MEDICATIONS: Current Outpatient Medications on File Prior to Visit  Medication Sig Dispense Refill  . acetaminophen (TYLENOL) 500 MG tablet Take 2 tablets (1,000 mg total) by mouth every 6 (six) hours as needed. 30 tablet 0  . aspirin EC 81 MG EC tablet Take 1 tablet (81 mg total) by mouth daily. Swallow whole. 90 tablet 1  . atorvastatin (LIPITOR) 80 MG tablet Take 1 tablet (80 mg total) by mouth daily. (Patient not taking: Reported on 04/21/2021) 90 tablet 1  . carvedilol (COREG) 6.25 MG tablet Take 1 tablet (6.25 mg total) by mouth 2 (two) times daily with a meal. 180 tablet 1  . hydrochlorothiazide (HYDRODIURIL) 12.5 MG tablet Take 1 tablet (  12.5 mg total) by mouth daily. 90 tablet 1  . losartan (COZAAR) 100 MG tablet Take 1 tablet (100 mg total) by mouth daily. 90 tablet 1  . Lubricants (K-Y JELLY) GEL Apply 1 application topically daily as needed. To vaginal area (Patient not taking: Reported on 04/21/2021) 113 g 5  . sertraline (ZOLOFT) 50 MG tablet Take 1.5 tablets (75 mg total) by mouth daily. 30 tablet 0   No current facility-administered medications on file prior to visit.    ALLERGIES: No Known Allergies  FAMILY HISTORY: Family History  Problem Relation Age of Onset  . Heart disease Mother        MI  . Cancer Father        bone  . Thyroid disease Neg Hx   .  Hypercalcemia Neg Hx   . Colon cancer Neg Hx   . Stomach cancer Neg Hx   . Rectal cancer Neg Hx   . Esophageal cancer Neg Hx       Objective:  *** General: No acute distress.  Patient appears ***-groomed.   Head:  Normocephalic/atraumatic Eyes:  Fundi examined but not visualized Neck: supple, no paraspinal tenderness, full range of motion Heart:  Regular rate and rhythm Lungs:  Clear to auscultation bilaterally Back: No paraspinal tenderness Neurological Exam: alert and oriented to person, place, and time. Attention span and concentration intact, recent and remote memory intact, fund of knowledge intact.  Speech fluent and not dysarthric, language intact.  CN II-XII intact. Bulk and tone normal, muscle strength 5/5 throughout.  Sensation to light touch, temperature and vibration intact.  Deep tendon reflexes 2+ throughout, toes downgoing.  Finger to nose and heel to shin testing intact.  Gait normal, Romberg negative.     Metta Clines, DO  CC: Sherrie Mustache, NP

## 2021-04-24 ENCOUNTER — Ambulatory Visit: Payer: BC Managed Care – PPO | Admitting: Neurology

## 2021-04-28 ENCOUNTER — Other Ambulatory Visit: Payer: Self-pay

## 2021-04-28 ENCOUNTER — Encounter (HOSPITAL_COMMUNITY): Payer: Self-pay | Admitting: Psychiatry

## 2021-04-28 ENCOUNTER — Telehealth (INDEPENDENT_AMBULATORY_CARE_PROVIDER_SITE_OTHER): Payer: BC Managed Care – PPO | Admitting: Psychiatry

## 2021-04-28 DIAGNOSIS — F332 Major depressive disorder, recurrent severe without psychotic features: Secondary | ICD-10-CM | POA: Diagnosis not present

## 2021-04-28 DIAGNOSIS — F419 Anxiety disorder, unspecified: Secondary | ICD-10-CM

## 2021-04-28 MED ORDER — SERTRALINE HCL 50 MG PO TABS: 75.0000 mg | ORAL_TABLET | Freq: Every day | ORAL | 0 refills | Status: DC

## 2021-04-28 NOTE — Progress Notes (Signed)
Virtual Visit via Telephone Note  I connected with Erin Cunningham on 04/28/21 at  2:40 PM EDT by telephone and verified that I am speaking with the correct person using two identifiers.  Location: Patient: Work Provider: Office   I discussed the limitations, risks, security and privacy concerns of performing an evaluation and management service by telephone and the availability of in person appointments. I also discussed with the patient that there may be a patient responsible charge related to this service. The patient expressed understanding and agreed to proceed.   History of Present Illness: Patient is evaluated by phone session.  She is at work.  She was last evaluated 4 weeks ago after she had been noncompliant with Zoloft for a while.  She wanted to go back to Zoloft because she felt having increased anxiety, anhedonia, isolated and withdrawn.  She is taking Zoloft 75 mg and noticed much improvement in her mood, sleep and her crying spells.  She is handling the stress much better.  She understand that her husband will not change but she feels the medicine helping her cope better.  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  She tried to keep herself busy.  Recently she had a blood work and she was told everything is normal.  She does not feel trapped in her life and she enjoys talking to her friends through church.  She is getting along with her son and his wife.  Her daughter lives in [place] and she talks to her regularly.  Patient reported no side effects from the medication.  She has no tremors, shakes or any EPS.  She like to keep the Zoloft to take every day because she noticed it is helping her mood.  Her energy level is improved.  Her appetite is okay.   Past Psychiatric History:Reviewed H/Odepression and anxiety. [facility]. [facility]O [facility]stay [facility] in 2016. No h/omania, psychosis, hallucination or suicidal  attempt.  Recent Results (from the past 2160 hour(s))  Lipid panel     Status: Abnormal   Collection Time: 04/21/21 11:37 AM  Result Value Ref Range   Cholesterol 189 <200 mg/dL   HDL 51 > OR = 50 mg/dL   Triglycerides 122 <150 mg/dL   LDL Cholesterol (Calc) 115 (H) mg/dL (calc)    Comment: Reference range: <100 . Desirable range <100 mg/dL for primary prevention;   <70 mg/dL for patients with CHD or diabetic patients  with > or = 2 CHD risk factors. Marland Kitchen LDL-C is now calculated using the Martin-Hopkins  calculation, which is a validated novel method providing  better accuracy than the Friedewald equation in the  estimation of LDL-C.  Cresenciano Genre et al. Annamaria Helling. 0998;338(25): 2061-2068  (http://education.[employer].com/faq/FAQ164)    Total CHOL/HDL Ratio 3.7 <5.0 (calc)   Non-HDL Cholesterol (Calc) 138 (H) <130 mg/dL (calc)    Comment: For patients with diabetes plus 1 major ASCVD risk  factor, treating to a non-HDL-C goal of <100 mg/dL  (LDL-C of <70 mg/dL) is considered a therapeutic  option.   COMPLETE METABOLIC PANEL WITH GFR     Status: Abnormal   Collection Time: 04/21/21 11:37 AM  Result Value Ref Range   Glucose, Bld 89 65 - 99 mg/dL    Comment: .            Fasting reference interval .    BUN 17 7 - 25 mg/dL   Creat 0.92 0.60 - 0.93 mg/dL  Comment: For patients >108 years of age, the reference limit for Creatinine is approximately 13% higher for people identified as African-American. .    GFR, Est Non African American 59 (L) > OR = 60 mL/min/1.30m2   GFR, Est African American 69 > OR = 60 mL/min/1.10m2   BUN/Creatinine Ratio NOT APPLICABLE 6 - 22 (calc)   Sodium 138 135 - 146 mmol/L   Potassium 4.0 3.5 - 5.3 mmol/L   Chloride 107 98 - 110 mmol/L   CO2 23 20 - 32 mmol/L   Calcium 10.7 (H) 8.6 - 10.4 mg/dL   Total Protein 6.4 6.1 - 8.1 g/dL   Albumin 3.8 3.6 - 5.1 g/dL   Globulin 2.6 1.9 - 3.7 g/dL (calc)   AG Ratio 1.5 1.0 - 2.5 (calc)   Total Bilirubin  0.5 0.2 - 1.2 mg/dL   Alkaline phosphatase (APISO) 89 37 - 153 U/L   AST 14 10 - 35 U/L   ALT 9 6 - 29 U/L     Psychiatric Specialty Exam: Physical Exam  Review of Systems  Weight 160 lb (72.6 kg).Body mass index is 31.25 kg/m.  General Appearance: NA  Eye Contact:  NA  Speech:  Clear and Coherent  Volume:  Normal  Mood:  Euthymic  Affect:  NA  Thought Process:  Goal Directed  Orientation:  Full (Time, Place, and Person)  Thought Content:  Logical  Suicidal Thoughts:  No  Homicidal Thoughts:  No  Memory:  Immediate;   Good Recent;   Good Remote;   Good  Judgement:  Intact  Insight:  Present  Psychomotor Activity:  NA  Concentration:  Concentration: Good and Attention Span: Good  Recall:  Good  Fund of Knowledge:  Good  Language:  Good  Akathisia:  No  Handed:  Right  AIMS (if indicated):     Assets:  Communication Skills Desire for Improvement Housing Resilience  ADL's:  Intact  Cognition:  WNL  Sleep:   better      Assessment and Plan: Major depressive disorder, recurrent.  Anxiety.  I reviewed blood work results.  Patient doing much better since started taking Zoloft regularly in past 4 weeks.  She has no side effects.  She does not feel she needs therapist because she had a good support work through [employer] and friends.  She like to keep the Zoloft 75 mg daily.  Discussed medication side effects and benefits.  Recommended to call us back if she has any question or any concern.  We will call the prescription 75 mg Zoloft with 2 refills.  Follow-up in 3 months.  Follow Up Instructions:    I discussed the assessment and treatment plan with the patient. The patient was provided an opportunity to ask questions and all were answered. The patient agreed with the plan and demonstrated an understanding of the instructions.   The patient was advised to call back or seek an in-person evaluation if the symptoms worsen or if the condition fails to improve as  anticipated.  I provided 18 minutes of non-face-to-face time during this encounter.   [name], MD

## 2021-04-30 ENCOUNTER — Other Ambulatory Visit: Payer: Self-pay

## 2021-04-30 DIAGNOSIS — E785 Hyperlipidemia, unspecified: Secondary | ICD-10-CM

## 2021-04-30 MED ORDER — ATORVASTATIN CALCIUM 80 MG PO TABS: 80.0000 mg | ORAL_TABLET | Freq: Every day | ORAL | 1 refills | Status: DC

## 2021-05-14 ENCOUNTER — Ambulatory Visit: Payer: Medicare Other | Admitting: Nurse Practitioner

## 2021-05-21 ENCOUNTER — Ambulatory Visit: Payer: Medicare Other | Admitting: Nurse Practitioner

## 2021-06-02 ENCOUNTER — Ambulatory Visit: Payer: Medicare Other | Admitting: Nurse Practitioner

## 2021-07-16 ENCOUNTER — Other Ambulatory Visit: Payer: Self-pay

## 2021-07-16 ENCOUNTER — Ambulatory Visit (INDEPENDENT_AMBULATORY_CARE_PROVIDER_SITE_OTHER): Payer: BC Managed Care – PPO | Admitting: Family

## 2021-07-16 ENCOUNTER — Encounter: Payer: Self-pay | Admitting: Family

## 2021-07-16 VITALS — BP 140/70 | HR 65 | Temp 97.3°F | Resp 16 | Ht 60.0 in | Wt 159.0 lb

## 2021-07-16 DIAGNOSIS — I10 Essential (primary) hypertension: Secondary | ICD-10-CM

## 2021-07-16 DIAGNOSIS — F419 Anxiety disorder, unspecified: Secondary | ICD-10-CM

## 2021-07-16 DIAGNOSIS — F332 Major depressive disorder, recurrent severe without psychotic features: Secondary | ICD-10-CM | POA: Diagnosis not present

## 2021-07-16 MED ORDER — HYDROCHLOROTHIAZIDE 25 MG PO TABS: 25.0000 mg | ORAL_TABLET | Freq: Every day | ORAL | 1 refills | Status: DC

## 2021-07-16 MED ORDER — CLONIDINE HCL 0.1 MG PO TABS
0.1000 mg | ORAL_TABLET | Freq: Once | ORAL | Status: AC
  Administered 2021-07-16: 0.1 mg via ORAL

## 2021-07-16 MED ORDER — SERTRALINE HCL 50 MG PO TABS: 75.0000 mg | ORAL_TABLET | Freq: Every day | ORAL | 1 refills | Status: DC

## 2021-07-16 NOTE — Progress Notes (Signed)
Provider: Jesiah Yerby FNP-C  Lauree Chandler, NP  Patient Care Team: Lauree Chandler, NP as PCP - General (Geriatric Medicine) [name], MD as Consulting Physician (Otolaryngology)  Extended Emergency Contact Information Primary Emergency Contact: Boerner,[name] Address: [address]          [place], [place] [address] [name] of [place] Phone: [phone] Mobile Phone: [phone] Relation: Spouse Secondary Emergency Contact: [place] Phone: [phone] Mobile Phone: [phone] Relation: Son  Code Status:  Full Code  Goals of care: Advanced Directive information Advanced Directives 07/16/2021  Does Patient Have a Medical Advance Directive? No  Would patient like information on creating a medical advance directive? No - Patient declined  Some encounter information is confidential and restricted. Go to Review Flowsheets activity to see all data.     Chief Complaint  Patient presents with   Acute Visit    Patient complains of BP running high.    HPI:  Pt is a 80 y.o. female seen today for an acute visit for evaluation of high blood pressure.On Losartan 100 mg daily,Hydrochlorothiazide 12.5 mg daily and Carvedilol 6.25 mg twice daily but states forgets to take her evening carvedilol.    Home B/p log readings in the 140's/80's - 180's/100's.states getting stress out caring for the Husband who is sick but demands a lot from her even stuff he can do by himself.  Her Granddaughter died last 2 weeks ago with COVID-19 which has worsen her stress level.Has many friends who have been bring food to her.also has sisters that she  talks with them.does not think she needs any counseling but sure misses her Granddaughter.thinks she is coping well.   Has had some dizziness off and on for two weeks. denies any headache,vision changes,fatigue,chest tightness,palpitation,chest pain or shortness of breath.       Past Medical  History:  Diagnosis Date   Breast CA ([place]) 2006   right ([place])   Cancer ([place])    Helicobacter pylori gastritis 2001   High blood pressure    History of adenomatous polyp of colon 09/23/2017   Oma 09/25/2017 and apparently polyps in the past as well though recalled age   History of shingles    Major depressive disorder 05/28/2020   Major depressive disorder, recurrent, severe without psychotic features ([place]) 06/24/2015   Parotid mass 2014   right benign cyst   Personal history of radiation therapy 2006   Rt breast   Stroke (cerebrum) ([place]) 06/28/2020   Per patient   Wears glasses    Past Surgical History:  Procedure Laterality Date   ABDOMINAL HYSTERECTOMY  1982   BSO   BREAST LUMPECTOMY Right 2007   BREAST LUMPECTOMY WITH SENTINEL LYMPH NODE BIOPSY  2005   right   BREAST SURGERY  2004   rt br mass   CESAREAN SECTION     COLONOSCOPY     ESOPHAGOGASTRODUODENOSCOPY  2001   H pylori gastritis   EYE SURGERY     both cataracts   PAROTIDECTOMY Right 09/05/2013   Procedure: RIGHT SUPERFICIAL PAROTIDECTOMY WITH FACIAL NERVE DISECTION;  Surgeon: [name], MD;  Location: [place];  Service: ENT;  Laterality: Right;  All documentation done by R.Ward after [phone] --done under G. Garzon's password   RE-EXCISION OF BREAST LUMPECTOMY  2005   right    No Known Allergies  Outpatient Encounter Medications as of 07/16/2021  Medication Sig   acetaminophen (TYLENOL) 500 MG tablet Take 2 tablets (  1,000 mg total) by mouth every 6 (six) hours as needed.   aspirin EC 81 MG EC tablet Take 1 tablet (81 mg total) by mouth daily. Swallow whole.   atorvastatin (LIPITOR) 80 MG tablet Take 1 tablet (80 mg total) by mouth daily.   carvedilol (COREG) 6.25 MG tablet Take 1 tablet (6.25 mg total) by mouth 2 (two) times daily with a meal.   hydrochlorothiazide (HYDRODIURIL) 12.5 MG tablet Take 1 tablet (12.5 mg total) by mouth daily.   losartan (COZAAR) 100 MG tablet Take 1  tablet (100 mg total) by mouth daily.   sertraline (ZOLOFT) 50 MG tablet Take 1.5 tablets (75 mg total) by mouth daily.   [DISCONTINUED] Lubricants (K-Y JELLY) GEL Apply 1 application topically daily as needed. To vaginal area (Patient not taking: Reported on 04/21/2021)   Facility-Administered Encounter Medications as of 07/16/2021  Medication   cloNIDine (CATAPRES) tablet 0.1 mg    Review of Systems  Constitutional:  Negative for appetite change, chills, fatigue, fever and unexpected weight change.  HENT:  Negative for congestion, dental problem, ear discharge, ear pain, facial swelling, hearing loss, nosebleeds, postnasal drip, rhinorrhea, sinus pressure, sinus pain, sneezing, sore throat, tinnitus and trouble swallowing.   Eyes:  Negative for pain, discharge, redness, itching and visual disturbance.  Respiratory:  Negative for cough, chest tightness, shortness of breath and wheezing.   Cardiovascular:  Negative for chest pain, palpitations and leg swelling.  Gastrointestinal:  Negative for abdominal distention, abdominal pain, blood in stool, constipation, diarrhea, nausea and vomiting.  Genitourinary:  Negative for difficulty urinating, dysuria, flank pain, frequency and urgency.  Musculoskeletal:  Negative for arthralgias, back pain, gait problem, joint swelling and myalgias.  Skin:  Negative for color change, pallor, rash and wound.  Neurological:  Negative for syncope, speech difficulty, weakness, light-headedness, numbness and headaches.       Had dizziness on and off but none today   Hematological:  Does not bruise/bleed easily.  Psychiatric/Behavioral:  Negative for agitation, behavioral problems, confusion, hallucinations, self-injury, sleep disturbance and suicidal ideas. The patient is not nervous/anxious.        Increased stress level with loss of granddaughter and being a care giver to husband    Immunization History  Administered Date(s) Administered   Fluad Quad(high Dose  65+) 01/15/2020, 12/20/2020   Influenza, High Dose Seasonal PF 09/14/2018   Influenza,inj,Quad PF,6+ Mos 11/06/2015   PFIZER(Purple Top)SARS-COV-2 Vaccination 02/03/2020, 02/24/2020   PPD Test 12/10/2015   Pneumococcal Conjugate-13 01/16/2015   Pneumococcal Polysaccharide-23 05/12/2017   Tdap 06/15/2018   Zoster, Unspecified 12/05/2014   Pertinent  Health Maintenance Due  Topic Date Due   INFLUENZA VACCINE  07/07/2021   DEXA SCAN  Completed   Fall Risk  07/16/2021 04/21/2021 03/04/2021 12/10/2020 10/29/2020  Falls in the past year? 0 0 0 0 0  Number falls in past yr: 0 0 0 0 0  Injury with Fall? 0 0 0 0 0  Risk for fall due to : No Fall Risks - - - -  Follow up Falls evaluation completed - - - -   Functional Status Survey:    Vitals:   07/16/21 0956 07/16/21 1024  BP: (!) 170/80 140/70  Pulse: 65   Resp: 16   Temp: (!) 97.3 F (36.3 C)   SpO2: 98%   Weight: 159 lb (72.1 kg)   Height: 5' (1.524 m)    Body mass index is 31.05 kg/m. Physical Exam Vitals reviewed.  Constitutional:  General: She is not in acute distress.    Appearance: Normal appearance. She is obese. She is not ill-appearing or diaphoretic.  HENT:     Head: Normocephalic.     Nose: Nose normal. No congestion or rhinorrhea.     Mouth/Throat:     Mouth: Mucous membranes are moist.     Pharynx: Oropharynx is clear. No oropharyngeal exudate or posterior oropharyngeal erythema.  Eyes:     General: No scleral icterus.       Right eye: No discharge.        Left eye: No discharge.     Extraocular Movements: Extraocular movements intact.     Conjunctiva/sclera: Conjunctivae normal.     Pupils: Pupils are equal, round, and reactive to light.  Neck:     Vascular: No carotid bruit.  Cardiovascular:     Rate and Rhythm: Normal rate and regular rhythm.     Pulses: Normal pulses.     Heart sounds: Normal heart sounds. No murmur heard.   No friction rub. No gallop.  Pulmonary:     Effort: Pulmonary effort  is normal. No respiratory distress.     Breath sounds: Normal breath sounds. No wheezing, rhonchi or rales.  Chest:     Chest wall: No tenderness.  Abdominal:     General: Bowel sounds are normal. There is no distension.     Palpations: Abdomen is soft. There is no mass.     Tenderness: There is no abdominal tenderness. There is no right CVA tenderness, left CVA tenderness, guarding or rebound.  Musculoskeletal:        General: No swelling or tenderness. Normal range of motion.     Cervical back: Normal range of motion. No rigidity or tenderness.     Right lower leg: No edema.     Left lower leg: No edema.  Lymphadenopathy:     Cervical: No cervical adenopathy.  Skin:    General: Skin is warm and dry.     Coloration: Skin is not pale.     Findings: No bruising, erythema or rash.  Neurological:     Mental Status: She is alert and oriented to person, place, and time.     Cranial Nerves: No cranial nerve deficit.     Sensory: No sensory deficit.     Motor: No weakness.     Coordination: Coordination normal.     Gait: Gait normal.  Psychiatric:        Mood and Affect: Mood normal.        Speech: Speech normal.        Behavior: Behavior normal.        Thought Content: Thought content normal.        Judgment: Judgment normal.    Labs reviewed: Recent Labs    12/20/20 0000 04/21/21 1137  NA 137 138  K 4.5 4.0  CL 106 107  CO2 22 23  GLUCOSE 81 89  BUN 18 17  CREATININE 0.92 0.92  CALCIUM 10.6* 10.7*   Recent Labs    12/20/20 0000 04/21/21 1137  AST 14 14  ALT 8 9  BILITOT 0.5 0.5  PROT 6.4 6.4   Recent Labs    12/20/20 0000  WBC 4.9  NEUTROABS 2,181  HGB 12.9  HCT 39.0  MCV 87.4  PLT 362   Lab Results  Component Value Date   TSH 1.09 06/13/2018   Lab Results  Component Value Date   HGBA1C 5.6 05/29/2020   Lab  Results  Component Value Date   CHOL 189 04/21/2021   HDL 51 04/21/2021   LDLCALC 115 (H) 04/21/2021   TRIG 122 04/21/2021   CHOLHDL 3.7  04/21/2021    Significant Diagnostic Results in last 30 days:  No results found.  Assessment/Plan  1. Major depressive disorder, recurrent severe without psychotic features ([place]) Mood stable  - continue to grief loss of granddaughter.Has good support system.  Refill sertraline.  - sertraline (ZOLOFT) 50 MG tablet; Take 1.5 tablets (75 mg total) by mouth daily.  Dispense: 90 tablet; Refill: 1  2. Anxiety Symptoms controlled on sertraline.  - sertraline (ZOLOFT) 50 MG tablet; Take 1.5 tablets (75 mg total) by mouth daily.  Dispense: 90 tablet; Refill: 1  3. Essential hypertension, benign B/p readings at home and here at the office are elevated. Clonidine given with much improvement  - forgetting to take Carvedilol evening dose.Advised to take with her evening meal or set an alarm to remind her to take it.  - Increase Hydrochlorothiazide from 12.5 mg to 25 mg tablet daily.  - cloNIDine (CATAPRES) tablet 0.1 mg - hydrochlorothiazide (HYDRODIURIL) 25 MG tablet; Take 1 tablet (25 mg total) by mouth daily.  Dispense: 90 tablet; Refill: 1 - Advised to check Blood pressure at home and record on log provided and notify provider if B/p > 140/90  - follow up in 2 weeks to recheck B/p   Family/ staff Communication: Reviewed plan of care with patient verbalized understanding.   Labs/tests ordered: None   Next Appointment: 2 weeks to recheck Blood pressure   Nelda Bucks Nailani Full, NP

## 2021-07-16 NOTE — Patient Instructions (Signed)
-   Increase Hydrochlorothiazide from 12.5 mg to 25 mg tablet one by mouth daily   - cut down on salt intake in food  - increase water intake to 6-8 glasses

## 2021-07-29 ENCOUNTER — Ambulatory Visit (INDEPENDENT_AMBULATORY_CARE_PROVIDER_SITE_OTHER): Payer: BC Managed Care – PPO | Admitting: Family

## 2021-07-29 ENCOUNTER — Other Ambulatory Visit: Payer: Self-pay

## 2021-07-29 ENCOUNTER — Encounter: Payer: Self-pay | Admitting: Family

## 2021-07-29 VITALS — BP 138/86 | HR 84 | Temp 97.7°F | Resp 16 | Ht 60.0 in | Wt 161.0 lb

## 2021-07-29 DIAGNOSIS — R300 Dysuria: Secondary | ICD-10-CM | POA: Diagnosis not present

## 2021-07-29 DIAGNOSIS — R059 Cough, unspecified: Secondary | ICD-10-CM

## 2021-07-29 DIAGNOSIS — Z20822 Contact with and (suspected) exposure to covid-19: Secondary | ICD-10-CM | POA: Diagnosis not present

## 2021-07-29 DIAGNOSIS — I10 Essential (primary) hypertension: Secondary | ICD-10-CM | POA: Diagnosis not present

## 2021-07-29 LAB — POCT URINALYSIS DIPSTICK
Bilirubin, UA: NEGATIVE
Blood, UA: NEGATIVE
Glucose, UA: NEGATIVE
Ketones, UA: NEGATIVE
Nitrite, UA: POSITIVE
Protein, UA: POSITIVE — AB
Spec Grav, UA: 1.015 (ref 1.010–1.025)
Urobilinogen, UA: 0.2 E.U./dL
pH, UA: 5 (ref 5.0–8.0)

## 2021-07-29 MED ORDER — CIPROFLOXACIN HCL 500 MG PO TABS: 500.0000 mg | ORAL_TABLET | Freq: Two times a day (BID) | ORAL | 0 refills | Status: AC

## 2021-07-29 MED ORDER — SACCHAROMYCES BOULARDII 250 MG PO CAPS: 250.0000 mg | ORAL_CAPSULE | Freq: Two times a day (BID) | ORAL | 0 refills | Status: DC

## 2021-07-29 NOTE — Patient Instructions (Signed)
-   increase your water intake to 6-8 glasses daily

## 2021-07-29 NOTE — Progress Notes (Signed)
Provider: Brylie Sneath FNP-C  Lauree Chandler, NP  Patient Care Team: Lauree Chandler, NP as PCP - General (Geriatric Medicine) [name], MD as Consulting Physician (Otolaryngology)  Extended Emergency Contact Information Primary Emergency Contact: Bobo,[name] Address: [address]          [place], [place] [address] [name] of [place] Phone: [phone] Mobile Phone: [phone] Relation: Spouse Secondary Emergency Contact: [place] Phone: [phone] Mobile Phone: [phone] Relation: Son  Code Status:  Full Code  Goals of care: Advanced Directive information Advanced Directives 07/29/2021  Does Patient Have a Medical Advance Directive? No  Would patient like information on creating a medical advance directive? No - Patient declined  Some encounter information is confidential and restricted. Go to Review Flowsheets activity to see all data.     Chief Complaint  Patient presents with   Acute Visit    Complains of little cough.     HPI:  Pt is a 80 y.o. female seen today for an acute visit for evaluation of little cough worst in the morning.wakes up in the morning with mucus in the throat.denies any fever,chills or shortness of breath.Has had no exposure to person sick with COVID-19. Also complains of pain with urination for several days.has to hold the pee then let it drip then pee. Not drinking enough water.denies any blood,frequency or urgency.   Also here for her 2 weeks blood pressure check.Her blood pressure at home ranging in the 140's/80's.b/p on arrival was high but recheck was normal.denies any headache,dizziness,vision changes,fatigue,chest tightness,palpitation,chest pain or shortness of breath.       Past Medical History:  Diagnosis Date   Breast CA ([place]) 2006   right ([place])   Cancer ([place])    Helicobacter pylori gastritis 2001   High blood pressure    History of adenomatous polyp of colon  09/23/2017   Oma 09/25/2017 and apparently polyps in the past as well though recalled age   History of shingles    Major depressive disorder 05/28/2020   Major depressive disorder, recurrent, severe without psychotic features ([place]) 06/24/2015   Parotid mass 2014   right benign cyst   Personal history of radiation therapy 2006   Rt breast   Stroke (cerebrum) ([place]) 06/28/2020   Per patient   Wears glasses    Past Surgical History:  Procedure Laterality Date   ABDOMINAL HYSTERECTOMY  1982   BSO   BREAST LUMPECTOMY Right 2007   BREAST LUMPECTOMY WITH SENTINEL LYMPH NODE BIOPSY  2005   right   BREAST SURGERY  2004   rt br mass   CESAREAN SECTION     COLONOSCOPY     ESOPHAGOGASTRODUODENOSCOPY  2001   H pylori gastritis   EYE SURGERY     both cataracts   PAROTIDECTOMY Right 09/05/2013   Procedure: RIGHT SUPERFICIAL PAROTIDECTOMY WITH FACIAL NERVE DISECTION;  Surgeon: [name], MD;  Location: [facility];  Service: ENT;  Laterality: Right;  All documentation done by R.Ward after [phone] --done under G. Garzon's password   RE-EXCISION OF BREAST LUMPECTOMY  2005   right    No Known Allergies  Outpatient Encounter Medications as of 07/29/2021  Medication Sig   acetaminophen (TYLENOL) 500 MG tablet Take 2 tablets (1,000 mg total) by mouth every 6 (six) hours as needed.   aspirin EC 81 MG EC tablet Take 1 tablet (81 mg total) by mouth daily. Swallow whole.   atorvastatin (LIPITOR) 80 MG tablet Take  1 tablet (80 mg total) by mouth daily.   carvedilol (COREG) 6.25 MG tablet Take 1 tablet (6.25 mg total) by mouth 2 (two) times daily with a meal.   hydrochlorothiazide (HYDRODIURIL) 25 MG tablet Take 1 tablet (25 mg total) by mouth daily.   losartan (COZAAR) 100 MG tablet Take 1 tablet (100 mg total) by mouth daily.   sertraline (ZOLOFT) 50 MG tablet Take 1.5 tablets (75 mg total) by mouth daily.   No facility-administered encounter medications on file as of 07/29/2021.     Review of Systems  Constitutional:  Negative for appetite change, chills, fatigue, fever and unexpected weight change.  HENT:  Negative for congestion, dental problem, ear discharge, ear pain, facial swelling, hearing loss, nosebleeds, postnasal drip, sinus pressure, sinus pain, sneezing, sore throat, tinnitus and trouble swallowing.        Nasal Drainage in the mornings   Eyes:  Negative for pain, discharge, redness, itching and visual disturbance.  Respiratory:  Negative for chest tightness, shortness of breath and wheezing.        Cough in the morning thinks from allergies   Cardiovascular:  Negative for chest pain, palpitations and leg swelling.  Gastrointestinal:  Negative for abdominal distention, abdominal pain, blood in stool, constipation, diarrhea, nausea and vomiting.  Endocrine: Negative for cold intolerance, heat intolerance, polydipsia, polyphagia and polyuria.  Genitourinary:  Negative for difficulty urinating, dysuria, flank pain, frequency and urgency.  Musculoskeletal:  Negative for arthralgias, back pain, gait problem, joint swelling, myalgias, neck pain and neck stiffness.  Skin:  Negative for color change, pallor, rash and wound.  Neurological:  Negative for dizziness, syncope, speech difficulty, weakness, light-headedness, numbness and headaches.  Hematological:  Does not bruise/bleed easily.  Psychiatric/Behavioral:  Negative for agitation, behavioral problems, confusion, hallucinations, self-injury, sleep disturbance and suicidal ideas. The patient is not nervous/anxious.    Immunization History  Administered Date(s) Administered   Fluad Quad(high Dose 65+) 01/15/2020, 12/20/2020   Influenza, High Dose Seasonal PF 09/14/2018   Influenza,inj,Quad PF,6+ Mos 11/06/2015   PFIZER(Purple Top)SARS-COV-2 Vaccination 02/03/2020, 02/24/2020   PPD Test 12/10/2015   Pneumococcal Conjugate-13 01/16/2015   Pneumococcal Polysaccharide-23 05/12/2017   Tdap 06/15/2018    Zoster, Unspecified 12/05/2014   Pertinent  Health Maintenance Due  Topic Date Due   INFLUENZA VACCINE  07/07/2021   DEXA SCAN  Completed   Fall Risk  07/29/2021 07/16/2021 04/21/2021 03/04/2021 12/10/2020  Falls in the past year? 0 0 0 0 0  Number falls in past yr: 0 0 0 0 0  Injury with Fall? 0 0 0 0 0  Risk for fall due to : No Fall Risks No Fall Risks - - -  Follow up Falls evaluation completed Falls evaluation completed - - -   Functional Status Survey:    Vitals:   07/29/21 1047  BP: (!) 160/98  Pulse: 84  Resp: 16  Temp: 97.7 F (36.5 C)  SpO2: 98%  Weight: 161 lb (73 kg)  Height: 5' (1.524 m)   Body mass index is 31.44 kg/m. Physical Exam Vitals reviewed.  Constitutional:      General: She is not in acute distress.    Appearance: Normal appearance. She is normal weight. She is not ill-appearing or diaphoretic.  HENT:     Nose: Nose normal. No congestion or rhinorrhea.     Mouth/Throat:     Mouth: Mucous membranes are moist.     Pharynx: Oropharynx is clear. No oropharyngeal exudate or posterior oropharyngeal erythema.  Eyes:  General: No scleral icterus.       Right eye: No discharge.        Left eye: No discharge.     Conjunctiva/sclera: Conjunctivae normal.     Pupils: Pupils are equal, round, and reactive to light.  Neck:     Vascular: No carotid bruit.  Cardiovascular:     Rate and Rhythm: Normal rate and regular rhythm.     Pulses: Normal pulses.     Heart sounds: Normal heart sounds. No murmur heard.   No friction rub. No gallop.  Pulmonary:     Effort: Pulmonary effort is normal. No respiratory distress.     Breath sounds: Normal breath sounds. No wheezing, rhonchi or rales.  Chest:     Chest wall: No tenderness.  Abdominal:     General: Bowel sounds are normal. There is no distension.     Palpations: Abdomen is soft. There is no mass.     Tenderness: There is abdominal tenderness in the suprapubic area. There is no right CVA tenderness, left  CVA tenderness, guarding or rebound.  Musculoskeletal:        General: No swelling or tenderness. Normal range of motion.     Cervical back: Normal range of motion. No rigidity or tenderness.     Right lower leg: No edema.     Left lower leg: No edema.  Lymphadenopathy:     Cervical: No cervical adenopathy.  Skin:    General: Skin is warm and dry.     Coloration: Skin is not pale.     Findings: No erythema.  Neurological:     Mental Status: She is alert and oriented to person, place, and time.     Cranial Nerves: No cranial nerve deficit.     Motor: No weakness.     Gait: Gait normal.  Psychiatric:        Mood and Affect: Mood normal.        Speech: Speech normal.        Behavior: Behavior normal.        Thought Content: Thought content normal.        Judgment: Judgment normal.    Labs reviewed: Recent Labs    12/20/20 0000 04/21/21 1137  NA 137 138  K 4.5 4.0  CL 106 107  CO2 22 23  GLUCOSE 81 89  BUN 18 17  CREATININE 0.92 0.92  CALCIUM 10.6* 10.7*   Recent Labs    12/20/20 0000 04/21/21 1137  AST 14 14  ALT 8 9  BILITOT 0.5 0.5  PROT 6.4 6.4   Recent Labs    12/20/20 0000  WBC 4.9  NEUTROABS 2,181  HGB 12.9  HCT 39.0  MCV 87.4  PLT 362   Lab Results  Component Value Date   TSH 1.09 06/13/2018   Lab Results  Component Value Date   HGBA1C 5.6 05/29/2020   Lab Results  Component Value Date   CHOL 189 04/21/2021   HDL 51 04/21/2021   LDLCALC 115 (H) 04/21/2021   TRIG 122 04/21/2021   CHOLHDL 3.7 04/21/2021    Significant Diagnostic Results in last 30 days:  No results found.  Assessment/Plan  1. Cough Afebrile. Coughs only in the mornings when she first wakes up. Bilateral lung CTA. - SARS-COV-2 RNA,(COVID-19) QUAL NAAT  2. Exposure to [id number] virus Conflicting information provided to staff and provider.reported no exposure. 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  - SARS-COV-2 RNA,(COVID-19) QUAL NAAT  3. Dysuria Supra pubic tenderness.  - POC Urinalysis Dipstick indicates positive nitrites with moderate amounts of leukocytes. Made aware will send urine for culture then will call with results in 3 days.will start on Cipro made aware that might need to change antibiotics if C& S is not covered by Cipro. SE discussed.  - advised to notify provider if symptoms worsen ,running any fever or chills - Increased water intake to 6-8 glasses daily  - ciprofloxacin (CIPRO) 500 MG tablet; Take 1 tablet (500 mg total) by mouth 2 (two) times daily for 7 days.  Dispense: 14 tablet; Refill: 0 - saccharomyces boulardii (FLORASTOR) 250 MG capsule; Take 1 capsule (250 mg total) by mouth 2 (two) times daily for 10 days.  Dispense: 20 capsule; Refill: 0 - Culture, Urine  4. Essential hypertension, benign Home b/p readings have improved. Rechecked normal. - continue on Hydrochlorothiazide, losartan and Coreg   Family/ staff Communication: Reviewed plan of care with patient verbalized understanding.   Labs/tests ordered:  - POC Urinalysis Dipstick - Culture, Urine  Next Appointment: 4 months for medical management of chronic issues.Fasting Labs prior to visit.   Time spent with patient 25 minutes >50% time spent counseling; reviewing medical record; tests; labs; and developing future plan of care     Sandrea Hughs, NP

## 2021-07-30 ENCOUNTER — Ambulatory Visit: Payer: Medicare Other | Admitting: Nurse Practitioner

## 2021-07-30 ENCOUNTER — Telehealth (HOSPITAL_COMMUNITY): Payer: BC Managed Care – PPO | Admitting: Psychiatry

## 2021-07-30 LAB — SARS-COV-2 RNA,(COVID-19) QUALITATIVE NAAT: SARS CoV2 RNA: NOT DETECTED

## 2021-07-31 ENCOUNTER — Other Ambulatory Visit: Payer: Self-pay

## 2021-07-31 ENCOUNTER — Telehealth (INDEPENDENT_AMBULATORY_CARE_PROVIDER_SITE_OTHER): Payer: BC Managed Care – PPO | Admitting: Psychiatry

## 2021-07-31 ENCOUNTER — Encounter (HOSPITAL_COMMUNITY): Payer: Self-pay | Admitting: Psychiatry

## 2021-07-31 DIAGNOSIS — F419 Anxiety disorder, unspecified: Secondary | ICD-10-CM

## 2021-07-31 DIAGNOSIS — F332 Major depressive disorder, recurrent severe without psychotic features: Secondary | ICD-10-CM

## 2021-07-31 MED ORDER — SERTRALINE HCL 50 MG PO TABS: 75.0000 mg | ORAL_TABLET | Freq: Every day | ORAL | 0 refills | Status: DC

## 2021-07-31 NOTE — Progress Notes (Signed)
Virtual Visit via Telephone Note  I connected with Erin Cunningham on 07/31/21 at  1:00 PM EDT by telephone and verified that I am speaking with the correct person using two identifiers.  Location: Patient: Work Provider: Biomedical scientist   I discussed the limitations, risks, security and privacy concerns of performing an evaluation and management service by telephone and the availability of in person appointments. I also discussed with the patient that there may be a patient responsible charge related to this service. The patient expressed understanding and agreed to proceed.   History of Present Illness: Patient is evaluated by phone session.  She reported increased anxiety lately as husband sometimes gets very argumentative and said mean things.  She also get upset about so far able to control her frustration.  Her grandkids is very helpful.  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  Now she is thinking about therapy.  When I ask about medication has recently filled by her PCP patient told she is taking 1 pill every day.  I explained she is supposed to take 75 mg not 50 mg and patient realize she was only taking 1 pill.  She is sleeping okay.  Other than anxiety she feels her depression is better.  Lives in [place] and she talks to her regularly.  She tried to go outside to spend time with her friends through the church.  She denies any side effects from the medication.   Past Psychiatric History: Reviewed H/O depression and anxiety.  One inpatient at [facility] for suicidal thoughts.  H/O brief stay in OBS unit in 2016.  No h/o mania, psychosis, hallucination or suicidal attempt.  Psychiatric Specialty Exam: Physical Exam  Review of Systems  Weight 161 lb (73 kg).There is no height or weight on file to calculate BMI.  General Appearance: NA  Eye Contact:  NA  Speech:  Normal Rate  Volume:  Normal  Mood:  Anxious  Affect:  NA  Thought  Process:  Goal Directed  Orientation:  Full (Time, Place, and Person)  Thought Content:  WDL  Suicidal Thoughts:  No  Homicidal Thoughts:  No  Memory:  Immediate;   Good Recent;   Good Remote;   Good  Judgement:  Intact  Insight:  Present  Psychomotor Activity:  NA  Concentration:  Concentration: Good and Attention Span: Good  Recall:  Good  Fund of Knowledge:  Good  Language:  Good  Akathisia:  No  Handed:  Right  AIMS (if indicated):     Assets:  Communication Skills Desire for Improvement Housing Transportation  ADL's:  Intact  Cognition:  WNL  Sleep:   ok      Assessment and Plan: Major depressive disorder, recurrent.  Anxiety.  Reinforced to take the medication 75 mg Zoloft every day.  Her PCP did not send enough pills and we will send a new prescription to her pharmacy.  Offered therapy to help her coping skills and patient will call back if she need individual counseling.  I recommend to call us back if she feels worsening of symptoms or having any question or any concern about the medication.  Follow-up in 3 months  Follow Up Instructions:    I discussed the assessment and treatment plan with the patient. The patient was provided an opportunity to ask questions and all were answered. The patient agreed with the plan and demonstrated an understanding of the instructions.  The patient was advised to call back or seek an in-person evaluation if the symptoms worsen or if the condition fails to improve as anticipated.  I provided 14 minutes of non-face-to-face time during this encounter.   [name], MD

## 2021-08-01 LAB — URINE CULTURE
MICRO NUMBER:: 12282938
SPECIMEN QUALITY:: ADEQUATE

## 2021-08-07 ENCOUNTER — Telehealth (INDEPENDENT_AMBULATORY_CARE_PROVIDER_SITE_OTHER): Payer: BC Managed Care – PPO | Admitting: Adult Health

## 2021-08-07 ENCOUNTER — Other Ambulatory Visit: Payer: Self-pay

## 2021-08-07 DIAGNOSIS — J069 Acute upper respiratory infection, unspecified: Secondary | ICD-10-CM

## 2021-08-07 MED ORDER — DOXYCYCLINE MONOHYDRATE 100 MG PO CAPS: 100.0000 mg | ORAL_CAPSULE | Freq: Two times a day (BID) | ORAL | 0 refills | Status: DC

## 2021-08-07 MED ORDER — VITAMIN C 1000 MG PO TABS: 1000.0000 mg | ORAL_TABLET | Freq: Every day | ORAL | 0 refills | Status: DC

## 2021-08-07 MED ORDER — SACCHAROMYCES BOULARDII 250 MG PO CAPS: 250.0000 mg | ORAL_CAPSULE | Freq: Two times a day (BID) | ORAL | 0 refills | Status: DC

## 2021-08-07 MED ORDER — ZINC SULFATE 220 (50 ZN) MG PO CAPS: 220.0000 mg | ORAL_CAPSULE | Freq: Every day | ORAL | 0 refills | Status: AC

## 2021-08-07 MED ORDER — GUAIFENESIN ER 600 MG PO TB12: 600.0000 mg | ORAL_TABLET | Freq: Two times a day (BID) | ORAL | 0 refills | Status: AC

## 2021-08-07 MED ORDER — DEXAMETHASONE 4 MG PO TABS: 4.0000 mg | ORAL_TABLET | Freq: Every day | ORAL | 0 refills | Status: AC

## 2021-08-07 NOTE — Progress Notes (Addendum)
DATE:  08/07/2021 MRN:  [id number]  BIRTHDAY: 1941-03-02   Contact Information     Name Relation Home Work Mobile   Swiech,[name] Spouse [phone]  [phone]   Katharina Caper & Princess Perna [id number]  [phone]        Code Status History     Date Active Date Inactive Code Status Order ID Comments User Context   05/28/2020 2146 05/30/2020 1657 Full Code [id number]  Vernelle Emerald, MD Inpatient   06/24/2015 2059 06/26/2015 1350 Full Code [id number]  Rennie Plowman Inpatient   06/24/2015 [id number] 06/24/2015 2059 Full Code [id number]  Ripley Fraise, MD ED      Questions for Most Recent Historical Code Status (Order [id number])         Chief Complaint  Patient presents with   Acute Visit    Patient cough w/ yellow/green mucus,chills, chest congestion, runny nose, sore throat since yesterday, 08/06/21. She reports drinking hot tea with honey. 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    HISTORY OF PRESENT ILLNESS: This is a 80 year old female who had a failed video visit so a telephone visit was done. She tested negative for COVID-19 on 8/23. She started having sore throat, chills, chest congestion and runny nose yesterday. She works for the school system. She was in school yesterday when she had the tickle on her throat and she started having productive cough with whitish to yellowish phlegm. She had shortness of breath and so she was seated on a chair and EMS was activated. Her husband picked her up from school yesterday. She did not check her temperature. She scheduled a phone visit today while she is at home and provider in the clinic.   PAST MEDICAL HISTORY:  Past Medical History:  Diagnosis Date   Breast CA ([place]) 2006   right ([place])   Cancer ([place])    Helicobacter pylori gastritis 2001   High blood pressure    History of adenomatous polyp of colon 09/23/2017   Oma 09/25/2017 and apparently polyps in the past as well though recalled age   History of shingles     Major depressive disorder 05/28/2020   Major depressive disorder, recurrent, severe without psychotic features ([place]) 06/24/2015   Parotid mass 2014   right benign cyst   Personal history of radiation therapy 2006   Rt breast   Stroke (cerebrum) ([place]) 06/28/2020   Per patient   Wears glasses      CURRENT MEDICATIONS: Reviewed  Patient's Medications  New Prescriptions   No medications on file  Previous Medications   ACETAMINOPHEN (TYLENOL) 500 MG TABLET    Take 2 tablets (1,000 mg total) by mouth every 6 (six) hours as needed.   ASPIRIN EC 81 MG EC TABLET    Take 1 tablet (81 mg total) by mouth daily. Swallow whole.   ATORVASTATIN (LIPITOR) 80 MG TABLET    Take 1 tablet (80 mg total) by mouth daily.   CARVEDILOL (COREG) 6.25 MG TABLET    Take 1 tablet (6.25 mg total) by mouth 2 (two) times daily with a meal.   HYDROCHLOROTHIAZIDE (HYDRODIURIL) 25 MG TABLET    Take 1 tablet (25 mg total) by mouth daily.   LOSARTAN (COZAAR) 100 MG TABLET    Take 1 tablet (100 mg total) by mouth daily.   SACCHAROMYCES BOULARDII (FLORASTOR) 250 MG CAPSULE    Take 1 capsule (250 mg total) by mouth 2 (two) times daily for 10 days.   SERTRALINE (ZOLOFT) 50 MG TABLET  Take 1.5 tablets (75 mg total) by mouth daily.  Modified Medications   No medications on file  Discontinued Medications   No medications on file     No Known Allergies   REVIEW OF SYSTEMS:  GENERAL: +chills MOUTH and THROAT: +sorethroat RESPIRATORY: +cough and SOB CARDIAC: no chest pain, edema or palpitations GI: no abdominal pain, diarrhea, constipation, heart burn, nausea or vomiting GU: Denies dysuria, frequency, hematuria, incontinence, or discharge NEUROLOGICAL: Denies dizziness, syncope, numbness, or headache PSYCHIATRIC: Denies feeling of depression or anxiety. No report of hallucinations, insomnia, paranoia, or agitation    LABS/RADIOLOGY: Labs reviewed: Basic Metabolic Panel: Recent Labs    12/20/20 0000  04/21/21 1137  NA 137 138  K 4.5 4.0  CL 106 107  CO2 22 23  GLUCOSE 81 89  BUN 18 17  CREATININE 0.92 0.92  CALCIUM 10.6* 10.7*   Liver Function Tests: Recent Labs    12/20/20 0000 04/21/21 1137  AST 14 14  ALT 8 9  BILITOT 0.5 0.5  PROT 6.4 6.4   CBC: Recent Labs    12/20/20 0000  WBC 4.9  NEUTROABS 2,181  HGB 12.9  HCT 39.0  MCV 87.4  PLT 362   A1C: Invalid input(s): A1C Lipid Panel: Recent Labs    04/21/21 1137  HDL 51      ASSESSMENT/PLAN:  1. URI with cough and congestion  - SARS-COV-2 RNA,(COVID-19) QUAL NAAT; Future - doxycycline (MONODOX) 100 MG capsule; Take 1 capsule (100 mg total) by mouth 2 (two) times daily for 7 days.  Dispense: 14 capsule; Refill: 0 - dexamethasone (DECADRON) 4 MG tablet; Take 1 tablet (4 mg total) by mouth daily for 7 days.  Dispense: 7 tablet; Refill: 0 - zinc sulfate 220 (50 Zn) MG capsule; Take 1 capsule (220 mg total) by mouth daily for 7 days.  Dispense: 7 capsule; Refill: 0 - Ascorbic Acid (VITAMIN C) 1000 MG tablet; Take 1 tablet (1,000 mg total) by mouth daily.  Dispense: 7 tablet; Refill: 0 - guaiFENesin (MUCINEX) 600 MG 12 hr tablet; Take 1 tablet (600 mg total) by mouth 2 (two) times daily for 7 days.  Dispense: 14 tablet; Refill: 0 - saccharomyces boulardii (FLORASTOR) 250 MG capsule; Take 1 capsule (250 mg total) by mouth 2 (two) times daily for 10 days.  Dispense: 20 capsule; Refill: 0 -  encouraged to drink more fluids for hydration and healthy meals   The patient is at home and the provider is at [facility] clinic.   Time spent on non face to face visit:  9 minutes  The patient gave consent to this telephone visit. Explained to the patient the risk and privacy issue that was involved with this telephone call.   The patient was advised to call back and ask for an in-person evaluation if the symptoms worsen or if the condition fails to improve.   Durenda Age, NP [employer] [phone]

## 2021-08-07 NOTE — Patient Instructions (Signed)
Upper Respiratory Infection, Adult  An upper respiratory infection (URI) affects the nose, throat, and upper air passages. URIs are caused by germs (viruses). The most common type of URI is often called "the common cold."  Medicines cannot cure URIs, but you can do things at home to relieve your symptoms. URIs usually get better within 7-10 days.  Follow these instructions at home:  Activity  Rest as needed.  If you have a fever, stay home from work or school until your fever is gone, or until your doctor says you may return to work or school.  You should stay home until you cannot spread the infection anymore (you are not contagious).  Your doctor may have you wear a face mask so you have less risk of spreading the infection.  Relieving symptoms  Gargle with a salt-water mixture 3-4 times a day or as needed. To make a salt-water mixture, completely dissolve -1 tsp of salt in 1 cup of warm water.  Use a cool-mist humidifier to add moisture to the air. This can help you breathe more easily.  Eating and drinking    Drink enough fluid to keep your pee (urine) pale yellow.  Eat soups and other clear broths.  General instructions    Take over-the-counter and prescription medicines only as told by your doctor. These include cold medicines, fever reducers, and cough suppressants.  Do not use any products that contain nicotine or tobacco. These include cigarettes and e-cigarettes. If you need help quitting, ask your doctor.  Avoid being where people are smoking (avoid secondhand smoke).  Make sure you get regular shots and get the flu shot every year.  Keep all follow-up visits as told by your doctor. This is important.  How to avoid spreading infection to others    Wash your hands often with soap and water. If you do not have soap and water, use hand sanitizer.  Avoid touching your mouth, face, eyes, or nose.  Cough or sneeze into a tissue or your sleeve or elbow. Do not cough or sneeze into your hand or into the  air.  Contact a doctor if:  You are getting worse, not better.  You have any of these:  A fever.  Chills.  Brown or red mucus in your nose.  Yellow or brown fluid (discharge)coming from your nose.  Pain in your face, especially when you bend forward.  Swollen neck glands.  Pain with swallowing.  White areas in the back of your throat.  Get help right away if:  You have shortness of breath that gets worse.  You have very bad or constant:  Headache.  Ear pain.  Pain in your forehead, behind your eyes, and over your cheekbones (sinus pain).  Chest pain.  You have long-lasting (chronic) lung disease along with any of these:  Wheezing.  Long-lasting cough.  Coughing up blood.  A change in your usual mucus.  You have a stiff neck.  You have changes in your:  Vision.  Hearing.  Thinking.  Mood.  Summary  An upper respiratory infection (URI) is caused by a germ called a virus. The most common type of URI is often called "the common cold."  URIs usually get better within 7-10 days.  Take over-the-counter and prescription medicines only as told by your doctor.  This information is not intended to replace advice given to you by your health care provider. Make sure you discuss any questions you have with your health care provider.  Document 

## 2021-08-14 ENCOUNTER — Other Ambulatory Visit: Payer: Self-pay | Admitting: Adult Health

## 2021-08-14 ENCOUNTER — Telehealth: Payer: Self-pay

## 2021-08-14 DIAGNOSIS — J069 Acute upper respiratory infection, unspecified: Secondary | ICD-10-CM

## 2021-08-14 MED ORDER — SACCHAROMYCES BOULARDII 250 MG PO CAPS: 250.0000 mg | ORAL_CAPSULE | Freq: Two times a day (BID) | ORAL | 0 refills | Status: AC

## 2021-08-14 MED ORDER — DOXYCYCLINE MONOHYDRATE 100 MG PO CAPS: 100.0000 mg | ORAL_CAPSULE | Freq: Two times a day (BID) | ORAL | 0 refills | Status: AC

## 2021-08-14 NOTE — Telephone Encounter (Signed)
Incoming call received from patient stating she took a home covid test last Thursday and it was negative, patient had a video visit with [place].  Patient states she completed the antibiotic and is still with productive cough, altered taste buds, and overall doesn't feel right. Patient states she does not know if she needs an xray or another round of antibiotics.   Please advise

## 2021-08-14 NOTE — Progress Notes (Signed)
Sent e-prescription for another 7 days of Doxycycline and Florastor. She needs to get tested for COVID-19 again.

## 2021-08-14 NOTE — Telephone Encounter (Signed)
Medina-Vargas, Monina C, NP  [facility] Clinical Pool 5 minutes ago (4:15 PM)    Sent e-prescription for another 7 days of Doxycycline and Florastor. She needs to get tested for COVID-19 again.     LMOM to return call.

## 2021-08-15 NOTE — Telephone Encounter (Signed)
Patient notified and agreed and will get a Home test to retest.

## 2021-09-26 ENCOUNTER — Ambulatory Visit (INDEPENDENT_AMBULATORY_CARE_PROVIDER_SITE_OTHER): Payer: BC Managed Care – PPO | Admitting: Family

## 2021-09-26 ENCOUNTER — Encounter: Payer: Self-pay | Admitting: Family

## 2021-09-26 ENCOUNTER — Other Ambulatory Visit: Payer: Self-pay

## 2021-09-26 VITALS — BP 160/90 | HR 83 | Temp 97.1°F | Resp 16 | Ht 60.0 in | Wt 161.0 lb

## 2021-09-26 DIAGNOSIS — M542 Cervicalgia: Secondary | ICD-10-CM | POA: Diagnosis not present

## 2021-09-26 DIAGNOSIS — M79604 Pain in right leg: Secondary | ICD-10-CM

## 2021-09-26 MED ORDER — BIOFREEZE 4 % EX GEL: 3.0000 [oz_av] | Freq: Three times a day (TID) | CUTANEOUS | 3 refills | Status: DC | PRN

## 2021-09-26 NOTE — Progress Notes (Signed)
Provider: Zaliyah Meikle FNP-C  Lauree Chandler, NP  Patient Care Team: Lauree Chandler, NP as PCP - General (Geriatric Medicine) [name], MD as Consulting Physician (Otolaryngology)  Extended Emergency Contact Information Primary Emergency Contact: Prestwood,[name] Address: [address]          [place], [place] [address] [name] of [place] Phone: [phone] Mobile Phone: [phone] Relation: Spouse Secondary Emergency Contact: [place] Phone: [phone] Mobile Phone: [phone] Relation: Son  Code Status:  Full Code  Goals of care: Advanced Directive information Advanced Directives 09/26/2021  Does Patient Have a Medical Advance Directive? No  Would patient like information on creating a medical advance directive? No - Patient declined  Some encounter information is confidential and restricted. Go to Review Flowsheets activity to see all data.     Chief Complaint  Patient presents with   Acute Visit    Patient complains of pain in neck.    HPI:  Pt is a 80 y.o. female seen today for an acute visit for evaluation of pain on the neck. X 1 week. pain worst with turning the head.Has had some popping with turning " crack sound " . Thinks her mattress and Pillow" also causing her to have pain.Denies any stiffness,numbness,tingling or weakness on extremities. No injuries to neck.   Also has lower back pain.did not have any pain today. Can move leg much better today.Has not been exercising lately.used exercise three times per week.  States " there's nothing wrong with me just want to come and say Hi to everyone".    Past Medical History:  Diagnosis Date   Breast CA ([facility]) 2006   right ([place])   Cancer ([place])    Helicobacter pylori gastritis 2001   High blood pressure    History of adenomatous polyp of colon 09/23/2017   Oma 09/25/2017 and apparently polyps in the past as well though recalled age   History of  shingles    Major depressive disorder 05/28/2020   Major depressive disorder, recurrent, severe without psychotic features ([place]) 06/24/2015   Parotid mass 2014   right benign cyst   Personal history of radiation therapy 2006   Rt breast   Stroke (cerebrum) ([place]) 06/28/2020   Per patient   Wears glasses    Past Surgical History:  Procedure Laterality Date   ABDOMINAL HYSTERECTOMY  1982   BSO   BREAST LUMPECTOMY Right 2007   BREAST LUMPECTOMY WITH SENTINEL LYMPH NODE BIOPSY  2005   right   BREAST SURGERY  2004   rt br mass   CESAREAN SECTION     COLONOSCOPY     ESOPHAGOGASTRODUODENOSCOPY  2001   H pylori gastritis   EYE SURGERY     both cataracts   PAROTIDECTOMY Right 09/05/2013   Procedure: RIGHT SUPERFICIAL PAROTIDECTOMY WITH FACIAL NERVE DISECTION;  Surgeon: [name], MD;  Location: [facility];  Service: ENT;  Laterality: Right;  All documentation done by R.Ward after [phone] --done under G. Garzon's password   RE-EXCISION OF BREAST LUMPECTOMY  2005   right    No Known Allergies  Outpatient Encounter Medications as of 09/26/2021  Medication Sig   acetaminophen (TYLENOL) 500 MG tablet Take 2 tablets (1,000 mg total) by mouth every 6 (six) hours as needed.   Ascorbic Acid (VITAMIN C) 1000 MG tablet Take 1 tablet (1,000 mg total) by mouth daily.   aspirin EC 81 MG EC tablet Take 1 tablet (81 mg total) by  mouth daily. Swallow whole.   atorvastatin (LIPITOR) 80 MG tablet Take 1 tablet (80 mg total) by mouth daily.   carvedilol (COREG) 6.25 MG tablet Take 1 tablet (6.25 mg total) by mouth 2 (two) times daily with a meal.   hydrochlorothiazide (HYDRODIURIL) 25 MG tablet Take 1 tablet (25 mg total) by mouth daily.   losartan (COZAAR) 100 MG tablet Take 1 tablet (100 mg total) by mouth daily.   sertraline (ZOLOFT) 50 MG tablet Take 1.5 tablets (75 mg total) by mouth daily.   No facility-administered encounter medications on file as of 09/26/2021.    Review  of Systems  Constitutional:  Negative for appetite change, chills, fatigue and fever.  HENT:  Negative for congestion, rhinorrhea, sneezing and sore throat.   Eyes:  Negative for pain, discharge, redness and itching.  Respiratory:  Negative for cough, chest tightness, shortness of breath and wheezing.   Cardiovascular:  Negative for chest pain, palpitations and leg swelling.  Gastrointestinal:  Negative for nausea and vomiting.  Genitourinary:  Negative for difficulty urinating, dysuria, flank pain, frequency and urgency.  Musculoskeletal:  Positive for arthralgias, back pain and neck pain. Negative for gait problem, joint swelling and neck stiffness.  Neurological:  Negative for dizziness, syncope, speech difficulty, weakness, light-headedness, numbness and headaches.   Immunization History  Administered Date(s) Administered   Fluad Quad(high Dose 65+) 01/15/2020, 12/20/2020   Influenza, High Dose Seasonal PF 09/14/2018   Influenza,inj,Quad PF,6+ Mos 11/06/2015   PFIZER(Purple Top)SARS-COV-2 Vaccination 02/03/2020, 02/24/2020   PPD Test 12/10/2015   Pneumococcal Conjugate-13 01/16/2015   Pneumococcal Polysaccharide-23 05/12/2017   Tdap 06/15/2018   Zoster, Unspecified 12/05/2014   Pertinent  Health Maintenance Due  Topic Date Due   INFLUENZA VACCINE  07/07/2021   DEXA SCAN  Completed   Fall Risk  09/26/2021 07/29/2021 07/16/2021 04/21/2021 03/04/2021  Falls in the past year? 0 0 0 0 0  Number falls in past yr: 0 0 0 0 0  Injury with Fall? 0 0 0 0 0  Risk for fall due to : No Fall Risks No Fall Risks No Fall Risks - -  Follow up Falls evaluation completed Falls evaluation completed Falls evaluation completed - -   Functional Status Survey:    Vitals:   09/26/21 1514  BP: (!) 160/90  Pulse: 83  Resp: 16  Temp: (!) 97.1 F (36.2 C)  SpO2: 97%  Weight: 161 lb (73 kg)  Height: 5' (1.524 m)   Body mass index is 31.44 kg/m. Physical Exam Vitals reviewed.  Constitutional:       General: She is not in acute distress.    Appearance: Normal appearance. She is obese. She is not ill-appearing or diaphoretic.  HENT:     Head: Normocephalic.     Right Ear: Tympanic membrane, ear canal and external ear normal. There is no impacted cerumen.     Left Ear: Tympanic membrane, ear canal and external ear normal. There is no impacted cerumen.     Nose: Nose normal. No congestion or rhinorrhea.     Mouth/Throat:     Mouth: Mucous membranes are moist.     Pharynx: Oropharynx is clear. No oropharyngeal exudate or posterior oropharyngeal erythema.  Eyes:     General: No scleral icterus.       Right eye: No discharge.        Left eye: No discharge.     Conjunctiva/sclera: Conjunctivae normal.     Pupils: Pupils are equal, round, and reactive to  light.  Neck:     Vascular: No carotid bruit.  Cardiovascular:     Rate and Rhythm: Normal rate and regular rhythm.     Pulses: Normal pulses.     Heart sounds: Normal heart sounds. No murmur heard.   No friction rub. No gallop.  Pulmonary:     Effort: Pulmonary effort is normal. No respiratory distress.     Breath sounds: Normal breath sounds. No wheezing, rhonchi or rales.  Chest:     Chest wall: No tenderness.  Abdominal:     General: Bowel sounds are normal. There is no distension.     Palpations: Abdomen is soft. There is no mass.     Tenderness: There is no abdominal tenderness. There is no right CVA tenderness, left CVA tenderness, guarding or rebound.  Musculoskeletal:        General: No swelling or tenderness. Normal range of motion.     Cervical back: Normal range of motion. No rigidity or tenderness.     Right lower leg: No edema.     Left lower leg: No edema.  Lymphadenopathy:     Cervical: No cervical adenopathy.  Skin:    General: Skin is warm and dry.     Coloration: Skin is not pale.     Findings: No erythema or rash.  Neurological:     Mental Status: She is alert. Mental status is at baseline.      Cranial Nerves: No cranial nerve deficit.     Sensory: No sensory deficit.     Motor: No weakness.     Coordination: Coordination normal.     Gait: Gait normal.  Psychiatric:        Mood and Affect: Mood normal.        Speech: Speech normal.        Behavior: Behavior normal.        Thought Content: Thought content normal.        Judgment: Judgment normal.    Labs reviewed: Recent Labs    12/20/20 0000 04/21/21 1137  NA 137 138  K 4.5 4.0  CL 106 107  CO2 22 23  GLUCOSE 81 89  BUN 18 17  CREATININE 0.92 0.92  CALCIUM 10.6* 10.7*   Recent Labs    12/20/20 0000 04/21/21 1137  AST 14 14  ALT 8 9  BILITOT 0.5 0.5  PROT 6.4 6.4   Recent Labs    12/20/20 0000  WBC 4.9  NEUTROABS 2,181  HGB 12.9  HCT 39.0  MCV 87.4  PLT 362   Lab Results  Component Value Date   TSH 1.09 06/13/2018   Lab Results  Component Value Date   HGBA1C 5.6 05/29/2020   Lab Results  Component Value Date   CHOL 189 04/21/2021   HDL 51 04/21/2021   LDLCALC 115 (H) 04/21/2021   TRIG 122 04/21/2021   CHOLHDL 3.7 04/21/2021    Significant Diagnostic Results in last 30 days:  No results found.  Assessment/Plan  1. Neck pain, musculoskeletal Negative exam findings. - continue on OTC Tylenol as needed for pain  - Notify provider if symptoms worsen  - advised to apply warm compressor to affected areas on the neck  - Menthol, Topical Analgesic, (BIOFREEZE) 4 % GEL; Apply 3 oz topically 3 (three) times daily as needed.  Dispense: 89 mL; Refill: 3  2. Pain of right lower extremity Negative exam  - Notify provider if symptoms worsen  - Menthol, Topical Analgesic, (BIOFREEZE) 4 %  GEL; Apply 3 oz topically 3 (three) times daily as needed.  Dispense: 89 mL; Refill: 3  Family/ staff Communication: Reviewed plan of care with patient verbalized understanding   Labs/tests ordered: None   Next Appointment: As needed if symptoms worsen or fail to improve    Sandrea Hughs, NP

## 2021-10-16 ENCOUNTER — Telehealth: Payer: Self-pay | Admitting: Nurse Practitioner

## 2021-11-03 ENCOUNTER — Other Ambulatory Visit: Payer: Self-pay

## 2021-11-03 ENCOUNTER — Telehealth (HOSPITAL_BASED_OUTPATIENT_CLINIC_OR_DEPARTMENT_OTHER): Payer: BC Managed Care – PPO | Admitting: Psychiatry

## 2021-11-03 ENCOUNTER — Encounter (HOSPITAL_COMMUNITY): Payer: Self-pay | Admitting: Psychiatry

## 2021-11-03 DIAGNOSIS — F419 Anxiety disorder, unspecified: Secondary | ICD-10-CM

## 2021-11-03 DIAGNOSIS — F332 Major depressive disorder, recurrent severe without psychotic features: Secondary | ICD-10-CM

## 2021-11-03 MED ORDER — SERTRALINE HCL 50 MG PO TABS
75.0000 mg | ORAL_TABLET | Freq: Every day | ORAL | 0 refills | Status: DC
Start: 2021-11-03 — End: 2022-02-02

## 2021-11-03 NOTE — Progress Notes (Signed)
Virtual Visit via Telephone Note  I connected with Erin Cunningham on 11/03/21 at  2:00 PM EST by telephone and verified that I am speaking with the correct person using two identifiers.  Location: Patient: Home Provider: Home Office   I discussed the limitations, risks, security and privacy concerns of performing an evaluation and management service by telephone and the availability of in person appointments. I also discussed with the patient that there may be a patient responsible charge related to this service. The patient expressed understanding and agreed to proceed.   History of Present Illness: Patient is evaluated by phone session.  She recently had COVID symptoms but getting better.  She had a good Thanksgiving as able to see her son's family who live close by.  Her daughter lives in [place] and she has another son who lives in [place].  She is feeling better since we have increased Zoloft and she is taking 75 mg daily.  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  She still have some time issues with her husband but she started to learn that he will not change so she like to focus on herself.  She is sleeping good.  She denies any crying spells or any panic attack.  Her appetite is okay.  She sleeps good.  She is hoping to see her children on Christmas.  She does not want to change the medication.  Past Psychiatric History: Reviewed H/O depression and anxiety.  One inpatient at [facility] for suicidal thoughts.  H/O brief stay in OBS unit in 2016.  No h/o mania, psychosis, hallucination or suicidal attempt.  Psychiatric Specialty Exam: Physical Exam  Review of Systems  Weight 161 lb (73 kg).There is no height or weight on file to calculate BMI.  General Appearance: NA  Eye Contact:  NA  Speech:  Normal Rate  Volume:  Normal  Mood:  Euthymic  Affect:  NA  Thought Process:  Goal Directed  Orientation:  Full  (Time, Place, and Person)  Thought Content:  Logical  Suicidal Thoughts:  No  Homicidal Thoughts:  No  Memory:  Immediate;   Good Recent;   Good Remote;   Good  Judgement:  Intact  Insight:  Present  Psychomotor Activity:  NA  Concentration:  Concentration: Good and Attention Span: Good  Recall:  Good  Fund of Knowledge:  Good  Language:  Good  Akathisia:  No  Handed:  Right  AIMS (if indicated):     Assets:  Communication Skills Desire for Improvement Housing Resilience Transportation  ADL's:  Intact  Cognition:  WNL  Sleep:   ok      Assessment and Plan: Major depressive disorder, recurrent.  Anxiety.  Continue Zoloft 75 mg daily.  She has no side effects from the medication.  Discussed medication side effects and benefits.  Recommended to call us back if she has any question or any concern.  Follow-up in 3 months.  Follow Up Instructions:    I discussed the assessment and treatment plan with the patient. The patient was provided an opportunity to ask questions and all were answered. The patient agreed with the plan and demonstrated an understanding of the instructions.   The patient was advised to call back or seek an in-person evaluation if the symptoms worsen or if the condition fails to improve as anticipated.  I provided 15 minutes of non-face-to-face time during this encounter.  [name], MD

## 2021-11-04 ENCOUNTER — Other Ambulatory Visit: Payer: Self-pay

## 2021-11-04 ENCOUNTER — Ambulatory Visit (INDEPENDENT_AMBULATORY_CARE_PROVIDER_SITE_OTHER): Payer: BC Managed Care – PPO | Admitting: Adult Health

## 2021-11-04 ENCOUNTER — Encounter: Payer: Self-pay | Admitting: Adult Health

## 2021-11-04 VITALS — BP 118/70 | HR 96 | Temp 97.5°F | Resp 16 | Ht 60.0 in

## 2021-11-04 DIAGNOSIS — R0989 Other specified symptoms and signs involving the circulatory and respiratory systems: Secondary | ICD-10-CM

## 2021-11-04 DIAGNOSIS — J029 Acute pharyngitis, unspecified: Secondary | ICD-10-CM

## 2021-11-04 DIAGNOSIS — R509 Fever, unspecified: Secondary | ICD-10-CM | POA: Diagnosis not present

## 2021-11-04 DIAGNOSIS — J069 Acute upper respiratory infection, unspecified: Secondary | ICD-10-CM

## 2021-11-04 DIAGNOSIS — R5383 Other fatigue: Secondary | ICD-10-CM | POA: Diagnosis not present

## 2021-11-04 DIAGNOSIS — R438 Other disturbances of smell and taste: Secondary | ICD-10-CM | POA: Diagnosis not present

## 2021-11-04 LAB — POCT INFLUENZA A/B
Influenza A, POC: NEGATIVE
Influenza B, POC: NEGATIVE

## 2021-11-04 LAB — POCT RAPID STREP A (OFFICE): Rapid Strep A Screen: NEGATIVE

## 2021-11-04 MED ORDER — DOXYCYCLINE HYCLATE 100 MG PO TABS: 100.0000 mg | ORAL_TABLET | Freq: Two times a day (BID) | ORAL | 0 refills | Status: AC

## 2021-11-04 MED ORDER — ZINC SULFATE 220 (50 ZN) MG PO TABS: 220.0000 mg | ORAL_TABLET | Freq: Every day | ORAL | 0 refills | Status: DC

## 2021-11-04 MED ORDER — VITAMIN D3 50 MCG (2000 UT) PO CAPS: 2000.0000 [IU] | ORAL_CAPSULE | Freq: Every day | ORAL | 0 refills | Status: AC

## 2021-11-04 MED ORDER — SACCHAROMYCES BOULARDII 250 MG PO CAPS: 250.0000 mg | ORAL_CAPSULE | Freq: Two times a day (BID) | ORAL | 0 refills | Status: AC

## 2021-11-04 NOTE — Patient Instructions (Addendum)

## 2021-11-04 NOTE — Progress Notes (Signed)
[facility] clinic  Provider:  Durenda Age  DNP  Code Status:   Full Code  Goals of Care:  Advanced Directives 11/04/2021  Does Patient Have a Medical Advance Directive? No  Would patient like information on creating a medical advance directive? No - Patient declined  Some encounter information is confidential and restricted. Go to Review Flowsheets activity to see all data.     Chief Complaint  Patient presents with   Acute Visit    Patient complains of sore throat, fever, fatigue, runny nose, and bitter taste in mouth.    HPI: Patient is a 80 y.o. female seen today for an acute visit for sore throat, fatigue and chills. She stated that she started feeling weak/low energy, runny nose, sore throat and chills 4 days ago. She has no thermometer at home so she was not able to check her temperature but "feels like has fever". Her symptoms continued for 3 days. She stated that she noticed that she has poor appetite but able to taste and smell food. She has minimal dry cough but denies body aches.  She has 2 COVID-19 vaccines. She has taken Nyquil daytime and Robitussin.     Past Medical History:  Diagnosis Date   Breast CA ([place]) 2006   right ([place])   Cancer ([place])    Helicobacter pylori gastritis 2001   High blood pressure    History of adenomatous polyp of colon 09/23/2017   Oma 09/25/2017 and apparently polyps in the past as well though recalled age   History of shingles    Major depressive disorder 05/28/2020   Major depressive disorder, recurrent, severe without psychotic features ([place]) 06/24/2015   Parotid mass 2014   right benign cyst   Personal history of radiation therapy 2006   Rt breast   Stroke (cerebrum) ([place]) 06/28/2020   Per patient   Wears glasses     Past Surgical History:  Procedure Laterality Date   ABDOMINAL HYSTERECTOMY  1982   BSO   BREAST LUMPECTOMY Right 2007   BREAST LUMPECTOMY WITH SENTINEL LYMPH NODE BIOPSY  2005   right   BREAST SURGERY   2004   rt br mass   CESAREAN SECTION     COLONOSCOPY     ESOPHAGOGASTRODUODENOSCOPY  2001   H pylori gastritis   EYE SURGERY     both cataracts   PAROTIDECTOMY Right 09/05/2013   Procedure: RIGHT SUPERFICIAL PAROTIDECTOMY WITH FACIAL NERVE DISECTION;  Surgeon: [name], MD;  Location: [facility];  Service: ENT;  Laterality: Right;  All documentation done by R.Ward after [phone] --done under G. Garzon's password   RE-EXCISION OF BREAST LUMPECTOMY  2005   right    No Known Allergies  Outpatient Encounter Medications as of 11/04/2021  Medication Sig   acetaminophen (TYLENOL) 500 MG tablet Take 2 tablets (1,000 mg total) by mouth every 6 (six) hours as needed.   Ascorbic Acid (VITAMIN C) 1000 MG tablet Take 1 tablet (1,000 mg total) by mouth daily.   aspirin EC 81 MG EC tablet Take 1 tablet (81 mg total) by mouth daily. Swallow whole.   atorvastatin (LIPITOR) 80 MG tablet Take 1 tablet (80 mg total) by mouth daily.   carvedilol (COREG) 6.25 MG tablet Take 1 tablet (6.25 mg total) by mouth 2 (two) times daily with a meal.   Cholecalciferol (VITAMIN D3) 50 MCG (2000 UT) capsule Take 1 capsule (2,000 Units total) by mouth daily for 10 days.   doxycycline (VIBRA-TABS)  100 MG tablet Take 1 tablet (100 mg total) by mouth 2 (two) times daily for 7 days.   hydrochlorothiazide (HYDRODIURIL) 25 MG tablet Take 1 tablet (25 mg total) by mouth daily.   losartan (COZAAR) 100 MG tablet Take 1 tablet (100 mg total) by mouth daily.   Menthol, Topical Analgesic, (BIOFREEZE) 4 % GEL Apply 3 oz topically 3 (three) times daily as needed.   saccharomyces boulardii (FLORASTOR) 250 MG capsule Take 1 capsule (250 mg total) by mouth 2 (two) times daily for 10 days.   sertraline (ZOLOFT) 50 MG tablet Take 1.5 tablets (75 mg total) by mouth daily.   Zinc Sulfate 220 (50 Zn) MG TABS Take 1 tablet (220 mg total) by mouth daily.   No facility-administered encounter medications on file as of  11/04/2021.    Review of Systems:  Review of Systems  Constitutional:  Positive for appetite change, chills and fever.       Has poor appetite and bitter taste in her mouth.  HENT:  Positive for sore throat. Negative for congestion, sinus pressure and voice change.   Eyes: Negative.   Respiratory:  Positive for cough. Negative for shortness of breath.   Cardiovascular: Negative.  Negative for leg swelling.  Gastrointestinal: Negative.  Negative for abdominal pain, diarrhea and vomiting.  Genitourinary: Negative.  Negative for difficulty urinating and dysuria.  Neurological:  Positive for weakness. Negative for dizziness and headaches.  Psychiatric/Behavioral: Negative.    All other systems reviewed and are negative.  Health Maintenance  Topic Date Due   Zoster Vaccines- Shingrix (1 of 2) 12/21/1991   COVID-19 Vaccine (3 - Booster for Pfizer series) 04/20/2020   INFLUENZA VACCINE  07/07/2021   TETANUS/TDAP  06/15/2028   Pneumonia Vaccine 51+ Years old  Completed   DEXA SCAN  Completed   Hepatitis C Screening  Completed   HPV VACCINES  Aged Out    Physical Exam: Vitals:   11/04/21 1550  BP: 118/70  Pulse: 96  Resp: 16  Temp: (!) 97.5 F (36.4 C)  SpO2: 97%  Height: 5' (1.524 m)   Body mass index is 31.44 kg/m. Physical Exam Constitutional:      Appearance: Normal appearance. She is normal weight.  HENT:     Head: Normocephalic and atraumatic.     Mouth/Throat:     Mouth: Mucous membranes are moist.     Pharynx: Oropharynx is clear.  Eyes:     Conjunctiva/sclera: Conjunctivae normal.  Cardiovascular:     Rate and Rhythm: Normal rate and regular rhythm.     Pulses: Normal pulses.     Heart sounds: Normal heart sounds.  Pulmonary:     Effort: Pulmonary effort is normal.     Breath sounds: Normal breath sounds.  Abdominal:     General: Bowel sounds are normal.  Musculoskeletal:     Cervical back: Normal range of motion and neck supple.     Right lower leg: No  edema.     Left lower leg: No edema.  Skin:    General: Skin is warm and dry.  Neurological:     General: No focal deficit present.     Mental Status: She is alert and oriented to person, place, and time.  Psychiatric:        Mood and Affect: Mood normal.        Behavior: Behavior normal.        Thought Content: Thought content normal.        Judgment:  Judgment normal.    Labs reviewed: Basic Metabolic Panel: Recent Labs    12/20/20 0000 04/21/21 1137  NA 137 138  K 4.5 4.0  CL 106 107  CO2 22 23  GLUCOSE 81 89  BUN 18 17  CREATININE 0.92 0.92  CALCIUM 10.6* 10.7*   Liver Function Tests: Recent Labs    12/20/20 0000 04/21/21 1137  AST 14 14  ALT 8 9  BILITOT 0.5 0.5  PROT 6.4 6.4    CBC: Recent Labs    12/20/20 0000  WBC 4.9  NEUTROABS 2,181  HGB 12.9  HCT 39.0  MCV 87.4  PLT 362   Lipid Panel: Recent Labs    04/21/21 1137  CHOL 189  HDL 51  LDLCALC 115*  TRIG 122  CHOLHDL 3.7   Lab Results  Component Value Date   HGBA1C 5.6 05/29/2020    Procedures since last visit: No results found.  Assessment/Plan  1. Fever, unspecified fever cause - POC Influenza A/B were negative - POC Rapid Strep A was negative - SARS-COV-2 RNA,(COVID-19) QUAL NAAT  2. Fatigue, unspecified type - POC Influenza A/B - POC Rapid Strep A - SARS-COV-2 RNA,(COVID-19) QUAL NAAT  3. Runny nose - POC Influenza A/B - POC Rapid Strep A - SARS-COV-2 RNA,(COVID-19) QUAL NAAT  4. Bitter taste - POC Influenza A/B - POC Rapid Strep A - SARS-COV-2 RNA,(COVID-19) QUAL NAAT  5. Sore throat -  advised to gargle with warm salty water - POC Influenza A/B - POC Rapid Strep A - SARS-COV-2 RNA,(COVID-19) QUAL NAAT  6. URI with cough and congestion - doxycycline (VIBRA-TABS) 100 MG tablet; Take 1 tablet (100 mg total) by mouth 2 (two) times daily for 7 days.  Dispense: 14 tablet; Refill: 0 - Zinc Sulfate 220 (50 Zn) MG TABS; Take 1 tablet (220 mg total) by mouth daily.   Dispense: 10 tablet; Refill: 0 - Cholecalciferol (VITAMIN D3) 50 MCG (2000 UT) capsule; Take 1 capsule (2,000 Units total) by mouth daily for 10 days.  Dispense: 10 capsule; Refill: 0 - saccharomyces boulardii (FLORASTOR) 250 MG capsule; Take 1 capsule (250 mg total) by mouth 2 (two) times daily for 10 days.  Dispense: 20 capsule; Refill: 0   Labs/tests ordered:    - POC Influenza A/B - POC Rapid Strep A - SARS-COV-2 RNA,(COVID-19) QUAL NAAT   Next appt:  11/28/2021

## 2021-11-05 LAB — SARS-COV-2 RNA,(COVID-19) QUALITATIVE NAAT: SARS CoV2 RNA: NOT DETECTED

## 2021-11-05 NOTE — Progress Notes (Signed)
COVID -19 test is negative

## 2021-11-07 NOTE — Telephone Encounter (Signed)
error 

## 2021-11-10 ENCOUNTER — Telehealth: Payer: Self-pay | Admitting: *Deleted

## 2021-11-10 NOTE — Telephone Encounter (Signed)
Patient notified and agreed.  

## 2021-11-10 NOTE — Telephone Encounter (Signed)
Patient called and stated that she was seen 11/04/2021 for URI. Stated that she is feeling much better but when she blows her nose she is blowing blood and she wants to know if this is ok. No other symptoms noted.   Please Advise.

## 2021-11-10 NOTE — Telephone Encounter (Signed)
LMOM to return call.

## 2021-11-10 NOTE — Telephone Encounter (Signed)
Would recommend that she not forcefully blow her nose, can use OTC saline to help moisturize the membranes in her nose.

## 2021-11-13 ENCOUNTER — Ambulatory Visit (INDEPENDENT_AMBULATORY_CARE_PROVIDER_SITE_OTHER): Payer: BC Managed Care – PPO | Admitting: Orthopedic Surgery

## 2021-11-13 ENCOUNTER — Other Ambulatory Visit: Payer: Self-pay

## 2021-11-13 ENCOUNTER — Encounter: Payer: Self-pay | Admitting: Orthopedic Surgery

## 2021-11-13 VITALS — BP 132/86 | HR 108 | Temp 96.9°F | Ht 60.0 in | Wt 164.8 lb

## 2021-11-13 DIAGNOSIS — I4891 Unspecified atrial fibrillation: Secondary | ICD-10-CM

## 2021-11-13 DIAGNOSIS — R42 Dizziness and giddiness: Secondary | ICD-10-CM

## 2021-11-13 DIAGNOSIS — R0602 Shortness of breath: Secondary | ICD-10-CM | POA: Diagnosis not present

## 2021-11-13 NOTE — Patient Instructions (Addendum)
Please take aspirin 81 mg daily  Continue taking carvedilol as prescribed  If shortness of breath or dizziness becomes worse- please report to ED

## 2021-11-13 NOTE — Progress Notes (Signed)
Careteam: Patient Care Team: Lauree Chandler, NP as PCP - General (Geriatric Medicine) [name], MD as Consulting Physician (Otolaryngology)  Seen by: Windell Moulding, AGNP-C  PLACE OF SERVICE:  [place] Directive information    No Known Allergies  Chief Complaint  Patient presents with   Acute Visit    Patient presents today for dizziness,SOB and pain while walking. She reports all symptoms started today,11/13/21.   Quality Metric Gaps    Zoster, Flu, COVID booster     HPI: Patient is a 80 y.o. female seen today for acute visit due to shortness of breath.   This morning she woke up and felt short of breath. Shortness of breath with exertion and movement. O2 sat 97 % on room air. She denies chest pain, arm pain, leg pain, and nausea. She is currently taking carvedilol and losartan for hypertension. She also takes aspirin 81 mg daily.   She also reports pain when walking. History of neck and back pain in the past. Denies recent fall or injury. She has not taken anything for pain this morning.   Dizziness when standing up this morning. Symptoms have resolved at this time.   EKG done in office- atrial fibrillation confirmed.         Review of Systems:  Review of Systems  Constitutional:  Negative for chills, fever, malaise/fatigue and weight loss.  HENT:  Negative for congestion and sore throat.   Eyes:  Negative for blurred vision and double vision.  Respiratory:  Positive for shortness of breath. Negative for cough and wheezing.   Cardiovascular:  Positive for palpitations. Negative for chest pain and leg swelling.  Gastrointestinal:  Negative for heartburn, nausea and vomiting.  Genitourinary:  Positive for dysuria. Negative for hematuria.  Musculoskeletal:  Positive for back pain and joint pain. Negative for falls and myalgias.  Neurological:  Positive for dizziness. Negative for weakness and headaches.  Psychiatric/Behavioral:  Positive  for depression. The patient is nervous/anxious.    Past Medical History:  Diagnosis Date   Breast CA ([place]) 2006   right ([place])   Cancer ([place])    Helicobacter pylori gastritis 2001   High blood pressure    History of adenomatous polyp of colon 09/23/2017   Oma 09/25/2017 and apparently polyps in the past as well though recalled age   History of shingles    Major depressive disorder 05/28/2020   Major depressive disorder, recurrent, severe without psychotic features ([place]) 06/24/2015   Parotid mass 2014   right benign cyst   Personal history of radiation therapy 2006   Rt breast   Stroke (cerebrum) ([place]) 06/28/2020   Per patient   Wears glasses    Past Surgical History:  Procedure Laterality Date   ABDOMINAL HYSTERECTOMY  1982   BSO   BREAST LUMPECTOMY Right 2007   BREAST LUMPECTOMY WITH SENTINEL LYMPH NODE BIOPSY  2005   right   BREAST SURGERY  2004   rt br mass   CESAREAN SECTION     COLONOSCOPY     ESOPHAGOGASTRODUODENOSCOPY  2001   H pylori gastritis   EYE SURGERY     both cataracts   PAROTIDECTOMY Right 09/05/2013   Procedure: RIGHT SUPERFICIAL PAROTIDECTOMY WITH FACIAL NERVE DISECTION;  Surgeon: [name], MD;  Location: [facility];  Service: ENT;  Laterality: Right;  All documentation done by R.Ward after [phone] --done under G. Garzon's password   RE-EXCISION OF BREAST LUMPECTOMY  2005  right   Social History:   reports that she has never smoked. She quit smokeless tobacco use about 4 years ago.  Her smokeless tobacco use included chew. She reports that she does not currently use alcohol. She reports that she does not use drugs.  Family History  Problem Relation Age of Onset   Heart disease Mother        MI   Cancer Father        bone   Thyroid disease Neg Hx    Hypercalcemia Neg Hx    Colon cancer Neg Hx    Stomach cancer Neg Hx    Rectal cancer Neg Hx    Esophageal cancer Neg Hx     Medications: Patient's Medications  New  Prescriptions   No medications on file  Previous Medications   ACETAMINOPHEN (TYLENOL) 500 MG TABLET    Take 2 tablets (1,000 mg total) by mouth every 6 (six) hours as needed.   ASCORBIC ACID (VITAMIN C) 1000 MG TABLET    Take 1 tablet (1,000 mg total) by mouth daily.   ASPIRIN EC 81 MG EC TABLET    Take 1 tablet (81 mg total) by mouth daily. Swallow whole.   ATORVASTATIN (LIPITOR) 80 MG TABLET    Take 1 tablet (80 mg total) by mouth daily.   CARVEDILOL (COREG) 6.25 MG TABLET    Take 1 tablet (6.25 mg total) by mouth 2 (two) times daily with a meal.   CHOLECALCIFEROL (VITAMIN D3) 50 MCG (2000 UT) CAPSULE    Take 1 capsule (2,000 Units total) by mouth daily for 10 days.   HYDROCHLOROTHIAZIDE (HYDRODIURIL) 25 MG TABLET    Take 1 tablet (25 mg total) by mouth daily.   LOSARTAN (COZAAR) 100 MG TABLET    Take 1 tablet (100 mg total) by mouth daily.   MENTHOL, TOPICAL ANALGESIC, (BIOFREEZE) 4 % GEL    Apply 3 oz topically 3 (three) times daily as needed.   SACCHAROMYCES BOULARDII (FLORASTOR) 250 MG CAPSULE    Take 1 capsule (250 mg total) by mouth 2 (two) times daily for 10 days.   SERTRALINE (ZOLOFT) 50 MG TABLET    Take 1.5 tablets (75 mg total) by mouth daily.   ZINC SULFATE 220 (50 ZN) MG TABS    Take 1 tablet (220 mg total) by mouth daily.  Modified Medications   No medications on file  Discontinued Medications   No medications on file    Physical Exam:  Vitals:   11/13/21 0944  BP: 132/86  Pulse: (!) 108  Temp: (!) 96.9 F (36.1 C)  SpO2: 97%  Weight: 164 lb 12.8 oz (74.8 kg)  Height: 5' (1.524 m)   Body mass index is 32.19 kg/m. Wt Readings from Last 3 Encounters:  11/13/21 164 lb 12.8 oz (74.8 kg)  09/26/21 161 lb (73 kg)  07/29/21 161 lb (73 kg)    Physical Exam Vitals reviewed.  Constitutional:      General: She is not in acute distress. HENT:     Head: Normocephalic.  Eyes:     General:        Right eye: No discharge.        Left eye: No discharge.  Neck:      Vascular: No carotid bruit.  Cardiovascular:     Rate and Rhythm: Normal rate. Rhythm irregular.     Pulses: Normal pulses.     Heart sounds: Normal heart sounds. No murmur heard. Pulmonary:  Effort: No respiratory distress.     Breath sounds: Normal breath sounds. No wheezing.  Abdominal:     General: Bowel sounds are normal. There is no distension.     Palpations: Abdomen is soft.     Tenderness: There is no abdominal tenderness.  Musculoskeletal:     Cervical back: Normal range of motion.     Right lower leg: No edema.     Left lower leg: No edema.     Comments: BLE cool to touch, no swelling  Lymphadenopathy:     Cervical: No cervical adenopathy.  Skin:    General: Skin is warm and dry.     Capillary Refill: Capillary refill takes less than 2 seconds.  Neurological:     General: No focal deficit present.     Mental Status: She is alert and oriented to person, place, and time.  Psychiatric:        Mood and Affect: Mood is anxious.        Behavior: Behavior normal.    Labs reviewed: Basic Metabolic Panel: Recent Labs    12/20/20 0000 04/21/21 1137  NA 137 138  K 4.5 4.0  CL 106 107  CO2 22 23  GLUCOSE 81 89  BUN 18 17  CREATININE 0.92 0.92  CALCIUM 10.6* 10.7*   Liver Function Tests: Recent Labs    12/20/20 0000 04/21/21 1137  AST 14 14  ALT 8 9  BILITOT 0.5 0.5  PROT 6.4 6.4   No results for input(s): LIPASE, AMYLASE in the last 8760 hours. No results for input(s): AMMONIA in the last 8760 hours. CBC: Recent Labs    12/20/20 0000  WBC 4.9  NEUTROABS 2,181  HGB 12.9  HCT 39.0  MCV 87.4  PLT 362   Lipid Panel: Recent Labs    04/21/21 1137  CHOL 189  HDL 51  LDLCALC 115*  TRIG 122  CHOLHDL 3.7   TSH: No results for input(s): TSH in the last 8760 hours. A1C: Lab Results  Component Value Date   HGBA1C 5.6 05/29/2020     Assessment/Plan 1. Dizziness - resolved at this time - EKG 12-Lead  2. SOB (shortness of breath) - with  exertion - sats 97% on room air - EKG 12-Lead  3. Atrial fibrillation, unspecified type ([place]) - confirmed by EKG today, rate 104 - continue aspirin 81 md po daily - cont coreg 6.25 mg po bid - Ambulatory referral to Cardiology- urgent - advised to reports to ED if shortness of breath increases  Total time: 33 minutes. Greater than 50 % of total time spent doing patient education regarding atrial fibrillation, risk factors and when to report to ED.   Next appt: Visit date not found  [place] [phone]

## 2021-11-17 ENCOUNTER — Ambulatory Visit (INDEPENDENT_AMBULATORY_CARE_PROVIDER_SITE_OTHER): Payer: BC Managed Care – PPO | Admitting: Cardiology

## 2021-11-17 ENCOUNTER — Other Ambulatory Visit: Payer: Self-pay

## 2021-11-17 ENCOUNTER — Encounter (HOSPITAL_BASED_OUTPATIENT_CLINIC_OR_DEPARTMENT_OTHER): Payer: Self-pay | Admitting: Cardiology

## 2021-11-17 VITALS — BP 160/80 | HR 51 | Ht 63.0 in | Wt 162.5 lb

## 2021-11-17 DIAGNOSIS — I480 Paroxysmal atrial fibrillation: Secondary | ICD-10-CM

## 2021-11-17 DIAGNOSIS — Z7189 Other specified counseling: Secondary | ICD-10-CM

## 2021-11-17 DIAGNOSIS — I10 Essential (primary) hypertension: Secondary | ICD-10-CM | POA: Diagnosis not present

## 2021-11-17 DIAGNOSIS — Z79899 Other long term (current) drug therapy: Secondary | ICD-10-CM

## 2021-11-17 DIAGNOSIS — I517 Cardiomegaly: Secondary | ICD-10-CM | POA: Diagnosis not present

## 2021-11-17 DIAGNOSIS — Z8673 Personal history of transient ischemic attack (TIA), and cerebral infarction without residual deficits: Secondary | ICD-10-CM | POA: Diagnosis not present

## 2021-11-17 LAB — CBC
Hematocrit: 39.9 % (ref 34.0–46.6)
Hemoglobin: 13.2 g/dL (ref 11.1–15.9)
MCH: 28.9 pg (ref 26.6–33.0)
MCHC: 33.1 g/dL (ref 31.5–35.7)
MCV: 87 fL (ref 79–97)
Platelets: 444 10*3/uL (ref 150–450)
RBC: 4.57 x10E6/uL (ref 3.77–5.28)
RDW: 12.5 % (ref 11.7–15.4)
WBC: 7.3 10*3/uL (ref 3.4–10.8)

## 2021-11-17 LAB — BASIC METABOLIC PANEL
BUN/Creatinine Ratio: 15 (ref 12–28)
BUN: 15 mg/dL (ref 8–27)
CO2: 25 mmol/L (ref 20–29)
Calcium: 11.2 mg/dL — ABNORMAL HIGH (ref 8.7–10.3)
Chloride: 104 mmol/L (ref 96–106)
Creatinine, Ser: 1.02 mg/dL — ABNORMAL HIGH (ref 0.57–1.00)
Glucose: 96 mg/dL (ref 70–99)
Potassium: 5.2 mmol/L (ref 3.5–5.2)
Sodium: 141 mmol/L (ref 134–144)
eGFR: 56 mL/min/{1.73_m2} — ABNORMAL LOW (ref >59)

## 2021-11-17 MED ORDER — RIVAROXABAN 20 MG PO TABS: 20.0000 mg | ORAL_TABLET | Freq: Every day | ORAL | 11 refills | Status: DC

## 2021-11-17 NOTE — Patient Instructions (Signed)
Medication Instructions:  STOP: Aspirin 81 mg daily START: Xarelto 20 mg daily  *If you need a refill on your cardiac medications before your next appointment, please call your pharmacy*   Lab Work: Your provider has recommended lab work today (CBC, BMP). Please have this collected at [facility] at [place]. The lab is open 8:00 am - 4:30 pm. Please avoid 12:00p - 1:00p for lunch hour. You do not need an appointment. Please go to [address] [place], [place] [address]. This is in the Primary Care office on the 3rd floor, let them know you are there for blood work and they will direct you to the lab.  If you have labs (blood work) drawn today and your tests are completely normal, you will receive your results only by: [place] (if you have MyChart) OR A paper copy in the mail If you have any lab test that is abnormal or we need to change your treatment, we will call you to review the results.   Testing/Procedures: None ordered today   Follow-Up: At [facility], you and your health needs are our priority.  As part of our continuing mission to provide you with exceptional heart care, we have created designated Provider Care Teams.  These Care Teams include your primary Cardiologist (physician) and Advanced Practice Providers (APPs -  Physician Assistants and Nurse Practitioners) who all work together to provide you with the care you need, when you need it.  We recommend signing up for the patient portal called "MyChart".  Sign up information is provided on this After Visit Summary.  MyChart is used to connect with patients for Virtual Visits (Telemedicine).  Patients are able to view lab/test results, encounter notes, upcoming appointments, etc.  Non-urgent messages can be sent to your provider as well.   To learn more about what you can do with MyChart, go to [email or web address].    Your next appointment:   6 month(s)  The format for your next  appointment:   In Person  Provider:   [name], MD

## 2021-11-17 NOTE — Progress Notes (Signed)
Cardiology Office Note:    Date:  11/17/2021   ID:  Erin Cunningham, DOB 04/26/1941, MRN [id number]  PCP:  Lauree Chandler, NP  Cardiologist:  [name], MD  Referring MD: Yvonna Alanis, NP   CC: new patient evaluation for atrial fibrillation   History of Present Illness:    Erin Cunningham is a 80 y.o. female with a hx of hypertension, prior CVA who is seen as a new consult at the request of [place], [place], NP for the evaluation and management of atrial fibrillation.  Note reviewed from Windell Moulding, NP dated 11/13/21. Noted to have abrupt onset of shortness of breath and lightheadedness, seen for urgent visit. Found to have new atrial fibrillation on ECG. Referred to cardiology for further evaluation. ECG reviewed, afib at 104 bpm. No new medications started.  Today: Has never been told she has issues with her heart. Had a stroke in the past. Never told that she has rhythm issues before.   On the day of her symptoms, felt weak in the morning, went to work. Felt dizzy/lightheaded, had trouble carry her bag into the office. Felt clammy, had a fan on her. Felt better briefly, but knew something wasn't right. Drove to the doctor's office, told she had afib. No change in medications. Symptoms lasted the entire day. Went home, laid down, started feeling better.   Drinks caffeine, large travel mug of coffee every day plus soda. Has a lot of stress that she has trouble dealing with, has been on zoloft for a while.   Denies chest pain, shortness of breath at rest or with normal exertion. No PND, orthopnea, LE edema or unexpected weight gain. No syncope or palpitations.   Past Medical History:  Diagnosis Date   Breast CA ([place]) 2006   right ([place])   Cancer ([place])    Helicobacter pylori gastritis 2001   High blood pressure    History of adenomatous polyp of colon 09/23/2017   Oma 09/25/2017 and apparently polyps in the past as well though recalled age   History of shingles     Major depressive disorder 05/28/2020   Major depressive disorder, recurrent, severe without psychotic features ([place]) 06/24/2015   Parotid mass 2014   right benign cyst   Personal history of radiation therapy 2006   Rt breast   Stroke (cerebrum) ([place]) 06/28/2020   Per patient   Wears glasses     Past Surgical History:  Procedure Laterality Date   ABDOMINAL HYSTERECTOMY  1982   BSO   BREAST LUMPECTOMY Right 2007   BREAST LUMPECTOMY WITH SENTINEL LYMPH NODE BIOPSY  2005   right   BREAST SURGERY  2004   rt br mass   CESAREAN SECTION     COLONOSCOPY     ESOPHAGOGASTRODUODENOSCOPY  2001   H pylori gastritis   EYE SURGERY     both cataracts   PAROTIDECTOMY Right 09/05/2013   Procedure: RIGHT SUPERFICIAL PAROTIDECTOMY WITH FACIAL NERVE DISECTION;  Surgeon: [name], MD;  Location: [facility];  Service: ENT;  Laterality: Right;  All documentation done by R.Ward after [phone] --done under G. Garzon's password   RE-EXCISION OF BREAST LUMPECTOMY  2005   right    Current Medications: Current Outpatient Medications on File Prior to Visit  Medication Sig   acetaminophen (TYLENOL) 500 MG tablet Take 2 tablets (1,000 mg total) by mouth every 6 (six) hours as needed.   Ascorbic Acid (VITAMIN C) 1000 MG tablet Take  1 tablet (1,000 mg total) by mouth daily.   aspirin EC 81 MG EC tablet Take 1 tablet (81 mg total) by mouth daily. Swallow whole.   atorvastatin (LIPITOR) 80 MG tablet Take 1 tablet (80 mg total) by mouth daily.   carvedilol (COREG) 6.25 MG tablet Take 1 tablet (6.25 mg total) by mouth 2 (two) times daily with a meal.   losartan (COZAAR) 100 MG tablet Take 1 tablet (100 mg total) by mouth daily.   Menthol, Topical Analgesic, (BIOFREEZE) 4 % GEL Apply 3 oz topically 3 (three) times daily as needed.   sertraline (ZOLOFT) 50 MG tablet Take 1.5 tablets (75 mg total) by mouth daily.   Zinc Sulfate 220 (50 Zn) MG TABS Take 1 tablet (220 mg total) by mouth daily.    hydrochlorothiazide (HYDRODIURIL) 25 MG tablet Take 1 tablet (25 mg total) by mouth daily. (Patient not taking: Reported on 11/17/2021)   No current facility-administered medications on file prior to visit.     Allergies:   Patient has no known allergies.   Social History   Tobacco Use   Smoking status: Never   Smokeless tobacco: Former    Types: Chew    Quit date: 08/18/2017  Vaping Use   Vaping Use: Never used  Substance Use Topics   Alcohol use: Not Currently    Alcohol/week: 0.0 standard drinks    Comment: occ   Drug use: No    Family History: family history includes Cancer in her father; Heart disease in her mother. There is no history of Thyroid disease, Hypercalcemia, Colon cancer, Stomach cancer, Rectal cancer, or Esophageal cancer.  ROS:   Please see the history of present illness.  Additional pertinent ROS: Constitutional: Negative for chills, fever, night sweats, unintentional weight loss  HENT: Negative for ear pain and hearing loss.   Eyes: Negative for loss of vision and eye pain.  Respiratory: Negative for cough, sputum, wheezing.   Cardiovascular: See HPI. Gastrointestinal: Negative for abdominal pain, melena, and hematochezia.  Genitourinary: Negative for dysuria and hematuria.  Musculoskeletal: Negative for falls and myalgias.  Skin: Negative for itching and rash.  Neurological: Negative for focal weakness, focal sensory changes and loss of consciousness.  Endo/Heme/Allergies: Does not bruise/bleed easily.     EKGs/Labs/Other Studies Reviewed:    The following studies were reviewed today: Echo 05/29/2020 1. There is a dynamic LVOT obstruction. Peak gradient of 17 mmHg. The  gradient increases to 35 mmHg with Valsalva.      . Left ventricular ejection fraction, by estimation, is 65 to 70%. The  left ventricle has normal function. The left ventricle has no regional  wall motion abnormalities. There is moderate concentric left ventricular  hypertrophy.  Left ventricular  diastolic parameters are consistent with Grade I diastolic dysfunction  (impaired relaxation).   2. Right ventricular systolic function is normal. The right ventricular  size is normal. There is normal pulmonary artery systolic pressure.   3. The mitral valve is grossly normal. No evidence of mitral valve  regurgitation. No evidence of mitral stenosis.   4. Tricuspid valve regurgitation is mild to moderate.   5. The aortic valve is tricuspid. Aortic valve regurgitation is not  visualized. No aortic stenosis is present.   EKG:  EKG is personally reviewed.   11/17/21: sinus bradycardia at 51 bpm  Recent Labs: 12/20/2020: Hemoglobin 12.9; Platelets 362 04/21/2021: ALT 9; BUN 17; Creat 0.92; Potassium 4.0; Sodium 138  Recent Lipid Panel    Component Value Date/Time  CHOL 189 04/21/2021 1137   CHOL 204 (H) 02/12/2015 0905   TRIG 122 04/21/2021 1137   HDL 51 04/21/2021 1137   HDL 51 02/12/2015 0905   CHOLHDL 3.7 04/21/2021 1137   VLDL 22 05/29/2020 0543   LDLCALC 115 (H) 04/21/2021 1137    Physical Exam:    VS:  BP (!) 160/80   Pulse (!) 51   Ht 5\' 3"  (1.6 m)   Wt 162 lb 8 oz (73.7 kg)   SpO2 98%   BMI 28.79 kg/m     Wt Readings from Last 3 Encounters:  11/17/21 162 lb 8 oz (73.7 kg)  11/13/21 164 lb 12.8 oz (74.8 kg)  09/26/21 161 lb (73 kg)    GEN: Well nourished, well developed in no acute distress HEENT: Normal, moist mucous membranes NECK: No JVD CARDIAC: regular rhythm, normal S1 and S2, no rubs or gallops. No murmur. VASCULAR: Radial and DP pulses 2+ bilaterally. No carotid bruits RESPIRATORY:  Clear to auscultation without rales, wheezing or rhonchi  ABDOMEN: Soft, non-tender, non-distended MUSCULOSKELETAL:  Ambulates independently SKIN: Warm and dry, no edema NEUROLOGIC:  Alert and oriented x 3. No focal neuro deficits noted. PSYCHIATRIC:  Normal affect    ASSESSMENT:    1. Paroxysmal atrial fibrillation (HCC)   2. Essential hypertension    3. History of CVA (cerebrovascular accident)   4. LVH (left ventricular hypertrophy)   5. Cardiac risk counseling   6. Counseling on health promotion and disease prevention   7. Medication management    PLAN:    Atrial fibrillation CHA2DS2/VAS Stroke Risk Points=6     Points Metrics  0 Has Congestive Heart Failure:  No   0 Has Vascular Disease:  No   1 Has Hypertension:  Yes   2 Age:  41   0 Has Diabetes:  No   2 Had Stroke:  No  Had TIA:  No  Had Thromboembolism:  No   1 Female:  Yes    -given elevated chadsvasc score and history of stroke, recommend anticoagulation. Stop aspirin. Will start rivaroxaban given once daily dosing.  History of CVA -currently on aspirin 81 mg, atorvastatin 80 mg daily -stop aspirin with start of DOAC  Moderate LVH with dynamic LVOT gradient -peak gradient with valsalva 35 mmHg on echo -on carvedilol for beta blocker  Hypertension: elevated today, but last two BP readings in office visits have been 118/70, 132/86 -continue to monitor -continue carvedilol, losartan, HCTZ  Cardiac risk counseling and prevention recommendations: -recommend heart healthy/Mediterranean diet, with whole grains, fruits, vegetable, fish, lean meats, nuts, and olive oil. Limit salt. -recommend moderate walking, 3-5 times/week for 30-50 minutes each session. Aim for at least 150 minutes.week. Goal should be pace of 3 miles/hours, or walking 1.5 miles in 30 minutes -recommend avoidance of tobacco products. Avoid excess alcohol. -ASCVD risk score: The 10-year ASCVD risk score (Arnett DK, et al., 2019) is: 21.3%   Values used to calculate the score:     Age: 24 years     Sex: Female     Is Non-Hispanic African American: Yes     Diabetic: No     Tobacco smoker: No     Systolic Blood Pressure: 998 mmHg     Is BP treated: Yes     HDL Cholesterol: 51 mg/dL     Total Cholesterol: 189 mg/dL    Plan for follow up: 6 mos or sooner as needed  [name], MD,  PhD, [place]  [facility]    Medication Adjustments/Labs and Tests Ordered: Current medicines are reviewed at length with the patient today.  Concerns regarding medicines are outlined above.  Orders Placed This Encounter  Procedures   EKG 12-Lead   Meds ordered this encounter  Medications   DISCONTD: rivaroxaban (XARELTO) 20 MG TABS tablet    Sig: Take 1 tablet (20 mg total) by mouth daily with supper.    Dispense:  30 tablet    Refill:  11    Patient will call when she needs this filled.   Patient Instructions  Medication Instructions:  STOP: Aspirin 81 mg daily START: Xarelto 20 mg daily  *If you need a refill on your cardiac medications before your next appointment, please call your pharmacy*   Lab Work: Your provider has recommended lab work today (CBC, BMP). Please have this collected at [facility] at [place]. The lab is open 8:00 am - 4:30 pm. Please avoid 12:00p - 1:00p for lunch hour. You do not need an appointment. Please go to [address] [place], [place] [address]. This is in the Primary Care office on the 3rd floor, let them know you are there for blood work and they will direct you to the lab.  If you have labs (blood work) drawn today and your tests are completely normal, you will receive your results only by: [place] (if you have MyChart) OR A paper copy in the mail If you have any lab test that is abnormal or we need to change your treatment, we will call you to review the results.   Testing/Procedures: None ordered today   Follow-Up: At [facility], you and your health needs are our priority.  As part of our continuing mission to provide you with exceptional heart care, we have created designated Provider Care Teams.  These Care Teams include your primary Cardiologist (physician) and Advanced Practice Providers (APPs -  Physician Assistants and Nurse Practitioners) who all work together to provide you with  the care you need, when you need it.  We recommend signing up for the patient portal called "MyChart".  Sign up information is provided on this After Visit Summary.  MyChart is used to connect with patients for Virtual Visits (Telemedicine).  Patients are able to view lab/test results, encounter notes, upcoming appointments, etc.  Non-urgent messages can be sent to your provider as well.   To learn more about what you can do with MyChart, go to [email or web address].    Your next appointment:   6 month(s)  The format for your next appointment:   In Person  Provider:   [name], MD     Signed, [name], MD PhD 11/17/2021 3:22 PM    [place]

## 2021-11-20 IMAGING — CR DG HAND COMPLETE 3+V*L*
3 series · 3 of 3 positions shown · non-contrast
Comparison: None.

CLINICAL DATA: Fifth digit pain.  Tenderness.

EXAM:
LEFT HAND - COMPLETE 3+ VIEW

[x hand pa left]
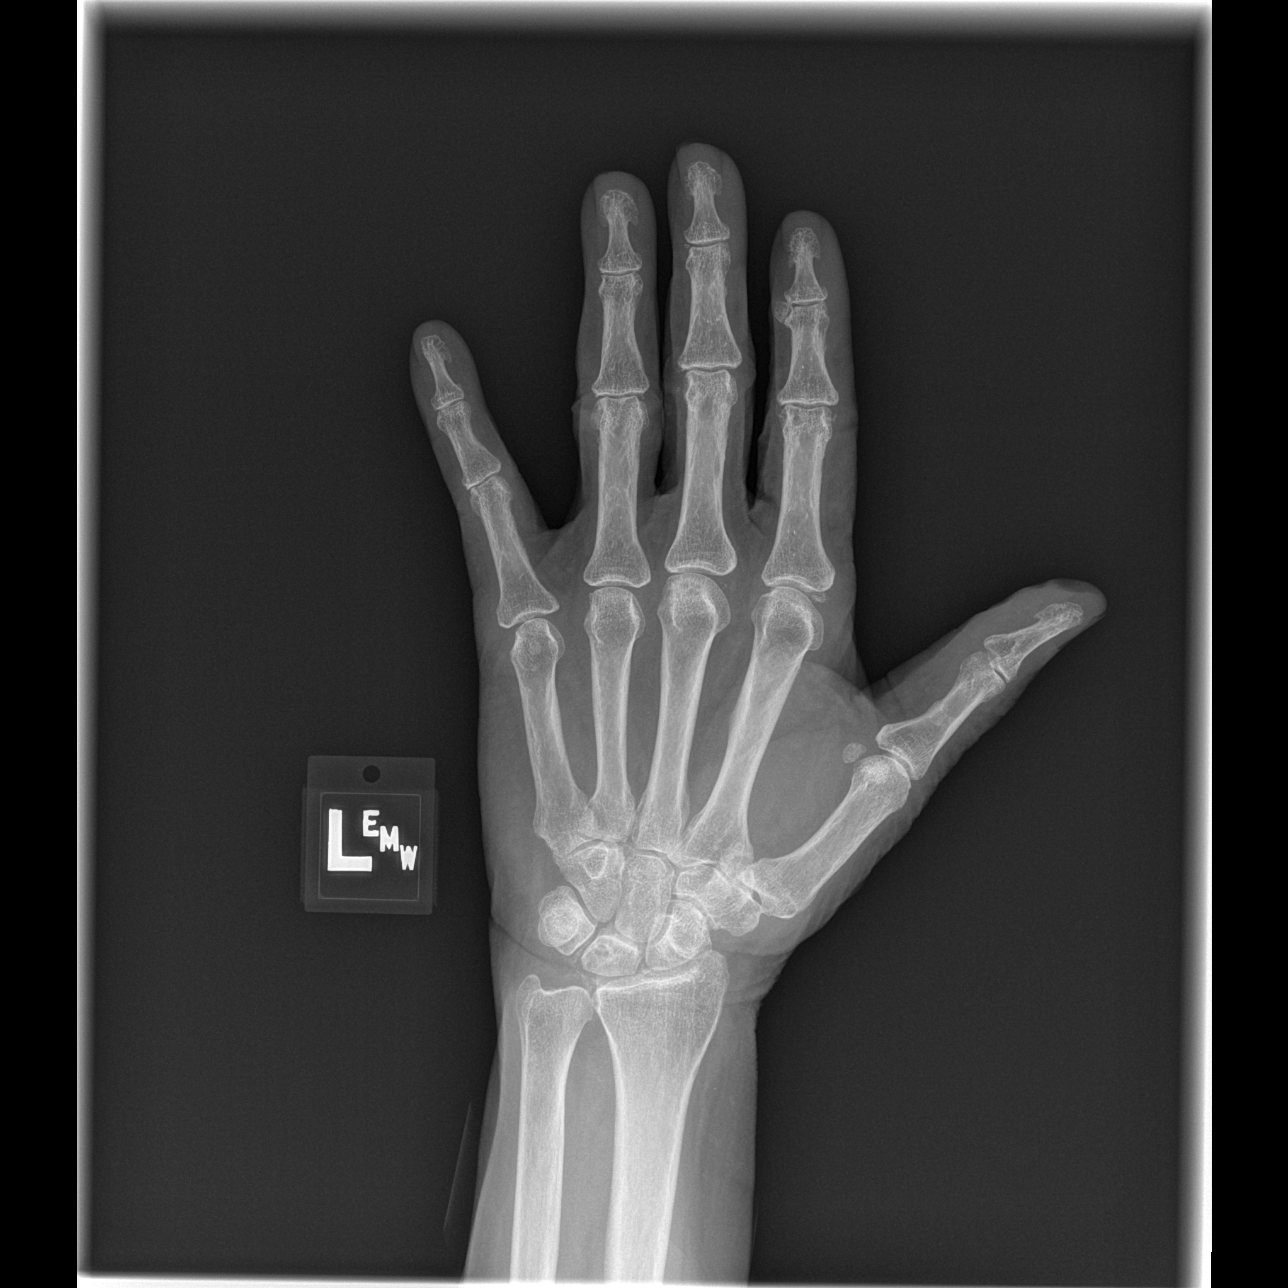

[x hand oblique left]
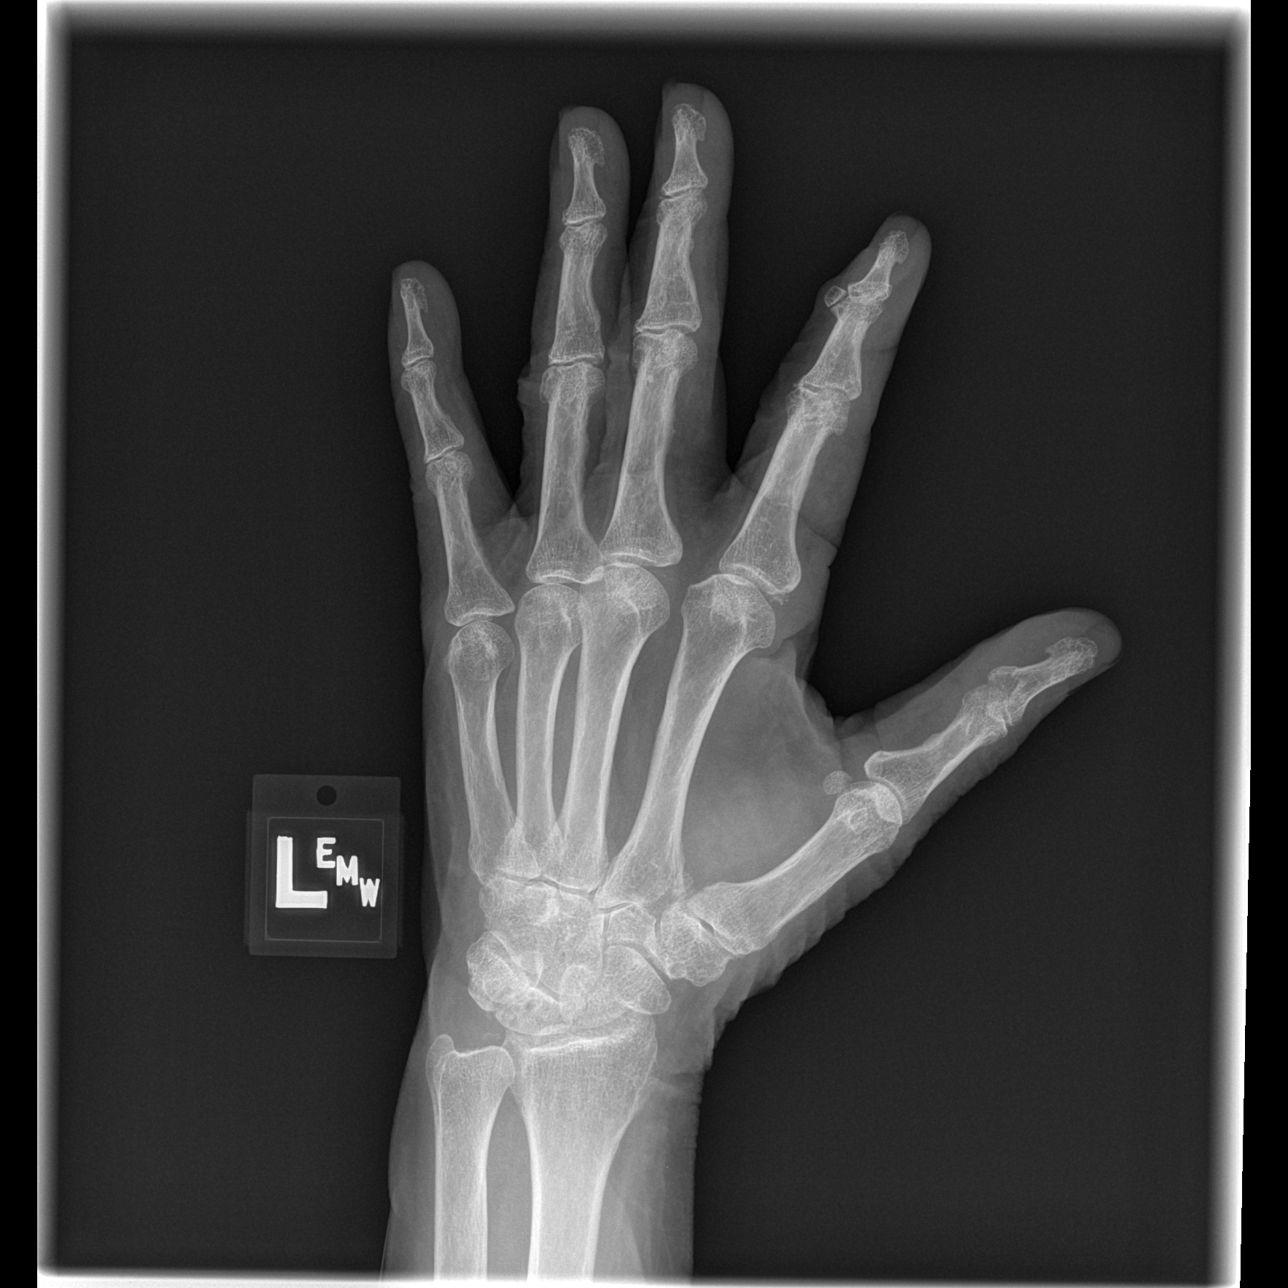

[x hand lat left]
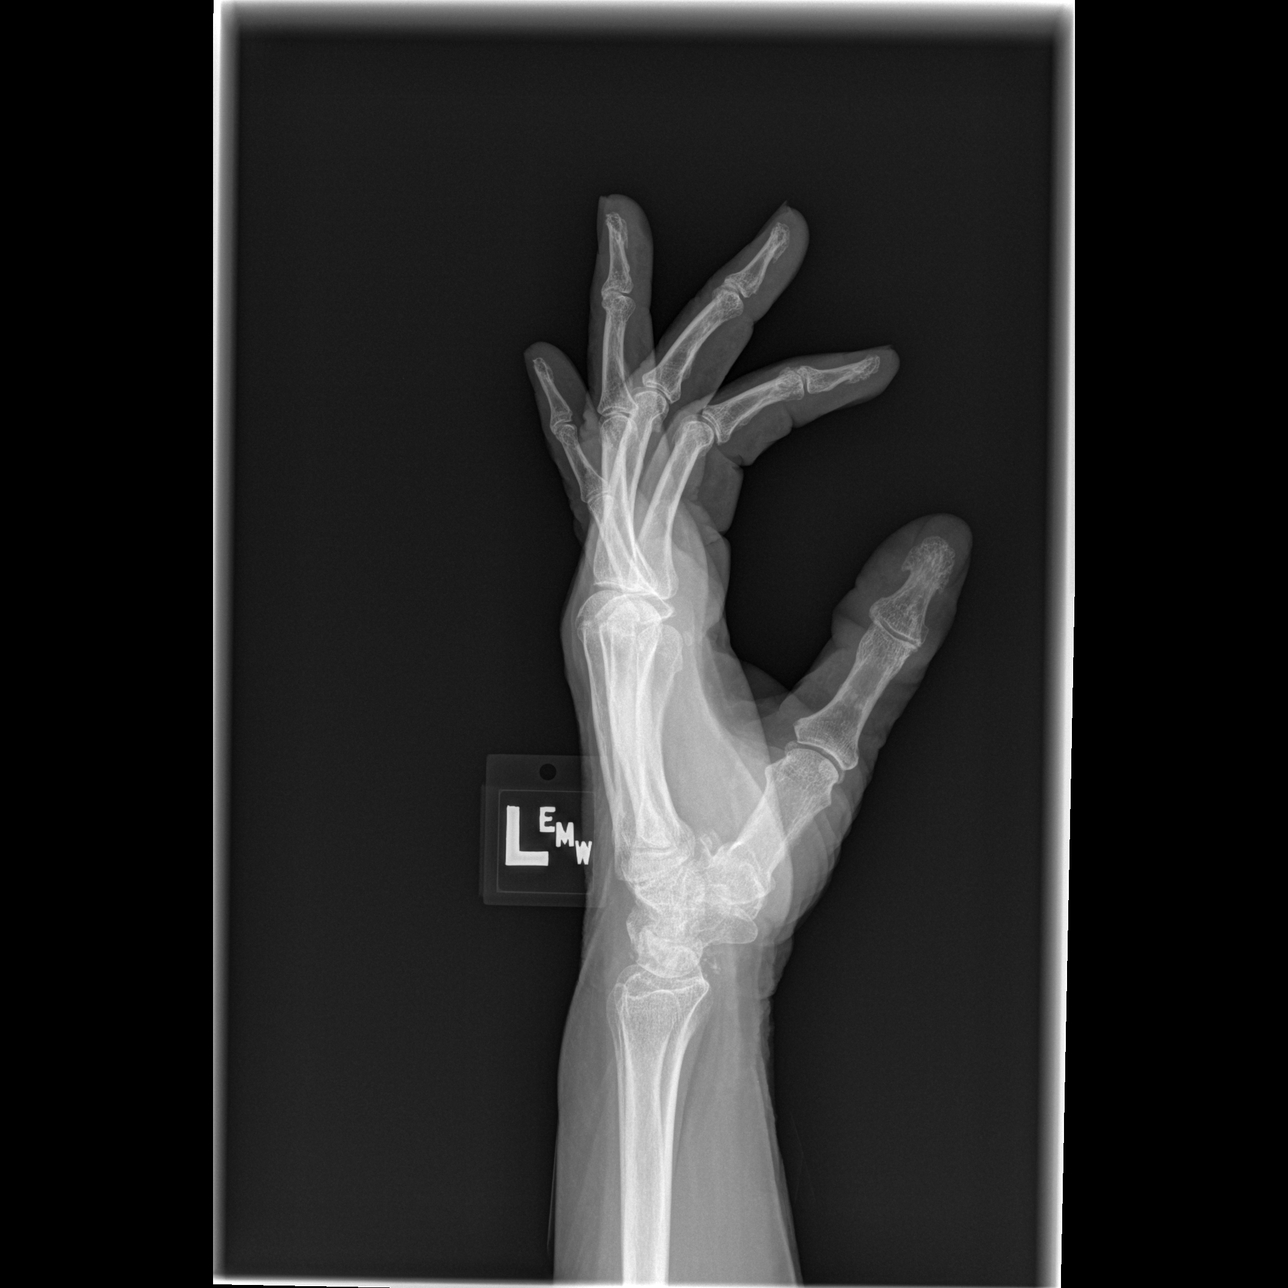

[3 of 3 positions shown; findings below may reference images not displayed]

FINDINGS: Mild diffuse soft tissue swelling. Mild degenerative changes are
noted involving the D IP joints. There is also mild basilar joint
and radiocarpal joint osteoarthritis. Chondrocalcinosis is
identified within the wrist. No acute fractures or dislocations
identified.
IMPRESSION: 1. No acute abnormality.
2. Osteoarthritis and chondrocalcinosis.

## 2021-11-28 ENCOUNTER — Ambulatory Visit: Payer: Medicare Other | Admitting: Nurse Practitioner

## 2021-12-03 ENCOUNTER — Telehealth (HOSPITAL_BASED_OUTPATIENT_CLINIC_OR_DEPARTMENT_OTHER): Payer: Self-pay | Admitting: Cardiology

## 2021-12-03 MED ORDER — RIVAROXABAN 20 MG PO TABS: 20.0000 mg | ORAL_TABLET | Freq: Every day | ORAL | 3 refills | Status: DC

## 2021-12-03 NOTE — Telephone Encounter (Signed)
Patient calling the office for samples of medication:   1.  What medication and dosage are you requesting samples for? rivaroxaban (XARELTO) 20 MG TABS tablet  2.  Are you currently out of this medication? Yes   

## 2021-12-03 NOTE — Telephone Encounter (Signed)
Patient out of Xarelto, refills have been sent to preferred pharmacy

## 2021-12-03 NOTE — Telephone Encounter (Signed)
Follow Up:     Patient said to let Erin Cunningham know that the Erin Cunningham was too expensive. She says she needs enough samples until 12-07-21 please,please today. She does not have any medicine    Patient calling the office for samples of medication:   1.  What medication and dosage are you requesting samples for? Xarelto  2.  Are you currently out of this medication? Enough until 12-07-21

## 2021-12-03 NOTE — Telephone Encounter (Signed)
Returned patient call and no answer, left voicemail stating that I would be here until 5pm if she wanted to come get samples, if not we can have her pick them up tomorrow morning. Left call back number to let me know what she chooses.    Samples pulled for patient!

## 2021-12-04 ENCOUNTER — Encounter: Payer: Medicare Other | Admitting: Nurse Practitioner

## 2021-12-04 ENCOUNTER — Telehealth: Payer: Self-pay

## 2021-12-04 ENCOUNTER — Other Ambulatory Visit: Payer: Self-pay

## 2021-12-04 NOTE — Progress Notes (Deleted)
Subjective:   Erin Cunningham is a 80 y.o. female who presents for Medicare Annual (Subsequent) preventive examination.  Review of Systems           Objective:    There were no vitals filed for this visit. There is no height or weight on file to calculate BMI.  Advanced Directives 11/04/2021 09/26/2021 07/29/2021 07/16/2021 04/21/2021 03/04/2021 12/20/2020  Does Patient Have a Medical Advance Directive? No No No No No No No  Would patient like information on creating a medical advance directive? No - Patient declined No - Patient declined No - Patient declined No - Patient declined No - Patient declined No - Patient declined No - Patient declined  Some encounter information is confidential and restricted. Go to Review Flowsheets activity to see all data.    Current Medications (verified) Outpatient Encounter Medications as of 12/04/2021  Medication Sig   acetaminophen (TYLENOL) 500 MG tablet Take 2 tablets (1,000 mg total) by mouth every 6 (six) hours as needed.   Ascorbic Acid (VITAMIN C) 1000 MG tablet Take 1 tablet (1,000 mg total) by mouth daily.   atorvastatin (LIPITOR) 80 MG tablet Take 1 tablet (80 mg total) by mouth daily.   carvedilol (COREG) 6.25 MG tablet Take 1 tablet (6.25 mg total) by mouth 2 (two) times daily with a meal.   losartan (COZAAR) 100 MG tablet Take 1 tablet (100 mg total) by mouth daily.   Menthol, Topical Analgesic, (BIOFREEZE) 4 % GEL Apply 3 oz topically 3 (three) times daily as needed.   rivaroxaban (XARELTO) 20 MG TABS tablet Take 1 tablet (20 mg total) by mouth daily with supper.   sertraline (ZOLOFT) 50 MG tablet Take 1.5 tablets (75 mg total) by mouth daily.   Zinc Sulfate 220 (50 Zn) MG TABS Take 1 tablet (220 mg total) by mouth daily.   No facility-administered encounter medications on file as of 12/04/2021.    Allergies (verified) Patient has no known allergies.   History: Past Medical History:  Diagnosis Date   Breast CA ([place]) 2006    right ([place])   Cancer ([place])    Helicobacter pylori gastritis 2001   High blood pressure    History of adenomatous polyp of colon 09/23/2017   [place]ma 09/25/2017 and apparently polyps in the past as well though recalled age   History of shingles    Major depressive disorder 05/28/2020   Major depressive disorder, recurrent, severe without psychotic features ([place]) 06/24/2015   Parotid mass 2014   right benign cyst   Personal history of radiation therapy 2006   Rt breast   Stroke (cerebrum) ([place]) 06/28/2020   Per patient   Wears glasses    Past Surgical History:  Procedure Laterality Date   ABDOMINAL HYSTERECTOMY  1982   BSO   BREAST LUMPECTOMY Right 2007   BREAST LUMPECTOMY WITH SENTINEL LYMPH NODE BIOPSY  2005   right   BREAST SURGERY  2004   rt br mass   CESAREAN SECTION     COLONOSCOPY     ESOPHAGOGASTRODUODENOSCOPY  2001   H pylori gastritis   EYE SURGERY     both cataracts   PAROTIDECTOMY Right 09/05/2013   Procedure: RIGHT SUPERFICIAL PAROTIDECTOMY WITH FACIAL NERVE DISECTION;  Surgeon: [name], MD;  Location: [facility];  Service: ENT;  Laterality: Right;  All documentation done by R.Ward after [phone] --done under G. Garzon's password   RE-EXCISION OF BREAST LUMPECTOMY  2005   right  Family History  Problem Relation Age of Onset   Heart disease Mother        MI   Cancer Father        bone   Thyroid disease Neg Hx    Hypercalcemia Neg Hx    Colon cancer Neg Hx    Stomach cancer Neg Hx    Rectal cancer Neg Hx    Esophageal cancer Neg Hx    Social History   Socioeconomic History   Marital status: Married    Spouse name: Not on file   Number of children: 3   Years of education: At least some college   Highest education level: Not on file  Occupational History   Occupation: [employer] civil engineer    Employer: [employer]  Tobacco Use   Smoking status: Never   Smokeless tobacco: Former    Types: Chew    Quit  date: 08/18/2017  Vaping Use   Vaping Use: Never used  Substance and Sexual Activity   Alcohol use: Not Currently    Alcohol/week: 0.0 standard drinks    Comment: occ   Drug use: No   Sexual activity: Not Currently  Other Topics Concern   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  Patient Care Team: Lauree Chandler, NP as PCP - General (Geriatric Medicine) [name], MD (Inactive) as Consulting Physician (Otolaryngology)  Indicate any recent Medical Services you may have received from other than [facility] providers in the past year (date may be approximate).     Assessment:   This is a routine wellness examination for [place].  Hearing/Vision screen No results found.  Dietary issues and exercise activities discussed:     Goals Addressed   None    Depression Screen PHQ 2/9 Scores 07/16/2020 01/15/2020 06/15/2018 04/12/2018 12/02/2016 02/15/2015 01/16/2015  PHQ - 2 Score 0 0 0 0 2 0 2  PHQ- 9 Score - - - - - - 2  Exception Documentation - (No Data) - - - - -    Fall Risk Fall Risk  11/13/2021 11/04/2021 09/26/2021 07/29/2021 07/16/2021  Falls in the past year? 0 0 0 0 0  Number falls in past yr: 0 0 0 0 0  Injury with Fall? 0 0 0 0 0  Risk for fall due to : No Fall Risks No Fall Risks No Fall Risks No  Fall Risks No Fall Risks  Follow up Falls evaluation completed;Education provided;Falls prevention discussed Falls evaluation completed Falls evaluation completed Falls evaluation completed Falls evaluation completed    FALL RISK PREVENTION PERTAINING TO THE HOME:  Any stairs in or around the home? {YES/NO:21197} If so, are there  any without handrails? {YES/NO:21197} Home free of loose throw rugs in walkways, pet beds, electrical cords, etc? {YES/NO:21197} Adequate lighting in your home to reduce risk of falls? {YES/NO:21197}  ASSISTIVE DEVICES UTILIZED TO PREVENT FALLS:  Life alert? {YES/NO:21197} Use of a cane, walker or w/c? {YES/NO:21197} Grab bars in the bathroom? {YES/NO:21197} Shower chair or bench in shower? {YES/NO:21197} Elevated toilet seat or a handicapped toilet? {YES/NO:21197}  TIMED UP AND GO:  Was the test performed? {YES/NO:21197}.  Length of time to ambulate 10 feet: *** sec.   {Appearance of Gait:2101803}  Cognitive Function: MMSE - Mini Mental State Exam 02/15/2015  Orientation to time 4  Orientation to Place 5  Registration 3  Attention/ Calculation 5  Recall 3  Language- name 2 objects 2  Language- repeat 1  Language- follow 3 step command 3  Language- read & follow direction 1  Write a sentence 1  Copy design 1  Total score 29     6CIT Screen 07/16/2020  What Year? 0 points  What month? 0 points  What time? 0 points  Count back from 20 0 points  Months in reverse 0 points  Repeat phrase 0 points  Total Score 0    Immunizations Immunization History  Administered Date(s) Administered   Fluad Quad(high Dose 65+) 01/15/2020, 12/20/2020   Influenza, High Dose Seasonal PF 09/14/2018   Influenza,inj,Quad PF,6+ Mos 11/06/2015   PFIZER(Purple Top)SARS-COV-2 Vaccination 02/03/2020, 02/24/2020   PPD Test 12/10/2015   Pneumococcal Conjugate-13 01/16/2015   Pneumococcal Polysaccharide-23 05/12/2017   Tdap 06/15/2018   Zoster, Unspecified  12/05/2014    {TDAP status:2101805}  {Flu Vaccine status:2101806}  {Pneumococcal vaccine status:2101807}  {Covid-19 vaccine status:2101808}  Qualifies for Shingles Vaccine? {YES/NO:21197}  Zostavax completed {YES/NO:21197}  {Shingrix Completed?:2101804}  Screening Tests Health Maintenance  Topic Date Due   Zoster Vaccines- Shingrix (1 of 2) 12/20/1959   COVID-19 Vaccine (3 - Pfizer risk series) 03/23/2020   INFLUENZA VACCINE  07/07/2021   TETANUS/TDAP  06/15/2028   Pneumonia Vaccine 47+ Years old  Completed   DEXA SCAN  Completed   Hepatitis C Screening  Completed   HPV VACCINES  Aged Out    Health Maintenance  Health Maintenance Due  Topic Date Due   Zoster Vaccines- Shingrix (1 of 2) [birth year]20/1959   COVID-19 Vaccine (3 - Pfizer risk series) 03/23/2020   INFLUENZA VACCINE  07/07/2021    {Colorectal cancer screening:2101809}  {Mammogram status:21018020}  {Bone Density status:21018021}  Lung Cancer Screening: (Low Dose CT Chest recommended if Age 38-80 years, 30 pack-year currently smoking OR have quit w/in 15years.) {DOES NOT does:27190::"does not"} qualify.   Lung Cancer Screening Referral: ***  Additional Screening:  Hepatitis C Screening: {DOES NOT does:27190::"does not"} qualify; Completed ***  Vision Screening: Recommended annual ophthalmology exams for early detection of glaucoma and other disorders of the eye. Is the patient up to date with their annual eye exam?  {YES/NO:21197} Who is the provider or what is the name of the office in which the patient attends annual eye exams? *** If pt is not established with a provider, would they like to be referred to a provider to establish care? {YES/NO:21197}.   Dental Screening: Recommended annual dental exams for proper oral hygiene  Community Resource Referral / Chronic Care Management: CRR required this visit?  {YES/NO:21197}  CCM required this visit?  {YES/NO:21197}     Plan:     I have personally  reviewed and noted the following in the patients chart:   Medical and social  history Use of alcohol, tobacco or illicit drugs  Current medications and supplements including opioid prescriptions.  Functional ability and status Nutritional status Physical activity Advanced directives List of other physicians Hospitalizations, surgeries, and ER visits in previous 12 months Vitals Screenings to include cognitive, depression, and falls Referrals and appointments  In addition, I have reviewed and discussed with patient certain preventive protocols, quality metrics, and best practice recommendations. A written personalized care plan for preventive services as well as general preventive health recommendations were provided to patient.     Lauree Chandler, NP   12/04/2021   Nurse Notes: ***

## 2021-12-04 NOTE — Telephone Encounter (Signed)
Patient is coming in to pick up the samples. Please advise

## 2021-12-04 NOTE — Telephone Encounter (Signed)
I made 3 unsuccessful attempts to reach patient to conduct AWV. Left messages with each call on the 3rd and final call patient was instructed to call office to reschedule.  1st Attempt-11:19 am 2nd attempt-11:30 am 3rd and final attempt-11:47 am

## 2021-12-04 NOTE — Telephone Encounter (Signed)
Patient was rescheduled from earlier today for AWV. I was reviewing medications and patient had a question about one of medications and I informed patient she would need to discuss that with Sherrie Mustache, NP. I was doing depression screen and patient told me that little things get her upset like the fact that I told her she would have to discuss medication with Janett Billow. She informed me that she will not do another AWV if it was going to be like this and to never schedule her for one again. She said she was ending the call and hung up.

## 2021-12-04 NOTE — Progress Notes (Signed)
This service is provided via telemedicine  No vital signs collected/recorded due to the encounter was a telemedicine visit.   Location of patient (ex: home, work):  Home  Patient consents to a telephone visit:  Yes, see encounter dated 12/04/2021  Location of the provider (ex: office, home):  Home  Name of any referring provider:  N/A  Names of all persons participating in the telemedicine service and their role in the encounter:  Sherrie Mustache, Nurse Practitioner, Carroll Kinds, CMA, and patient.   Time spent on call:  15 minutes with medical assistant

## 2021-12-04 NOTE — Telephone Encounter (Signed)
Returned patient call and got voicemail. Left instructions for patient that her samples are at the front desk.

## 2021-12-12 ENCOUNTER — Telehealth (HOSPITAL_BASED_OUTPATIENT_CLINIC_OR_DEPARTMENT_OTHER): Payer: Self-pay

## 2021-12-12 ENCOUNTER — Other Ambulatory Visit: Payer: Self-pay

## 2021-12-12 MED ORDER — RIVAROXABAN 20 MG PO TABS: 20.0000 mg | ORAL_TABLET | Freq: Every day | ORAL | 3 refills | Status: DC

## 2021-12-12 NOTE — Telephone Encounter (Signed)
Pt called stating she is unable to afford 90 day supply of xarelto. Pt requesting 30 day supply.  Will forward to pharm D for approval.

## 2021-12-12 NOTE — Telephone Encounter (Signed)
Prescription refill request for Xarelto received.  Indication:Afib Last office visit:12/22 Weight:73.7 kg Age:81 Scr:1.0 CrCl:53.07 ml/min  Prescription refilled

## 2022-01-12 ENCOUNTER — Emergency Department (HOSPITAL_COMMUNITY): Payer: BC Managed Care – PPO

## 2022-01-12 ENCOUNTER — Encounter (HOSPITAL_COMMUNITY): Payer: Self-pay | Admitting: Emergency Medicine

## 2022-01-12 ENCOUNTER — Emergency Department (HOSPITAL_COMMUNITY)
Admission: EM | Admit: 2022-01-12 | Discharge: 2022-01-12 | Disposition: A | Discharge status: Home / Self Care | Payer: BC Managed Care – PPO | Attending: Emergency Medicine | Admitting: Emergency Medicine

## 2022-01-12 ENCOUNTER — Other Ambulatory Visit: Payer: Self-pay

## 2022-01-12 DIAGNOSIS — S0990XA Unspecified injury of head, initial encounter: Secondary | ICD-10-CM | POA: Diagnosis not present

## 2022-01-12 DIAGNOSIS — M25561 Pain in right knee: Secondary | ICD-10-CM | POA: Diagnosis not present

## 2022-01-12 DIAGNOSIS — Z853 Personal history of malignant neoplasm of breast: Secondary | ICD-10-CM | POA: Insufficient documentation

## 2022-01-12 DIAGNOSIS — Z79899 Other long term (current) drug therapy: Secondary | ICD-10-CM | POA: Insufficient documentation

## 2022-01-12 DIAGNOSIS — Y92480 Sidewalk as the place of occurrence of the external cause: Secondary | ICD-10-CM | POA: Insufficient documentation

## 2022-01-12 DIAGNOSIS — R519 Headache, unspecified: Secondary | ICD-10-CM

## 2022-01-12 DIAGNOSIS — Y990 Civilian activity done for income or pay: Secondary | ICD-10-CM | POA: Insufficient documentation

## 2022-01-12 DIAGNOSIS — I10 Essential (primary) hypertension: Secondary | ICD-10-CM | POA: Diagnosis not present

## 2022-01-12 DIAGNOSIS — Z23 Encounter for immunization: Secondary | ICD-10-CM | POA: Insufficient documentation

## 2022-01-12 DIAGNOSIS — I4891 Unspecified atrial fibrillation: Secondary | ICD-10-CM | POA: Insufficient documentation

## 2022-01-12 DIAGNOSIS — S8991XA Unspecified injury of right lower leg, initial encounter: Secondary | ICD-10-CM | POA: Diagnosis present

## 2022-01-12 DIAGNOSIS — S80211A Abrasion, right knee, initial encounter: Secondary | ICD-10-CM | POA: Diagnosis not present

## 2022-01-12 DIAGNOSIS — W19XXXA Unspecified fall, initial encounter: Secondary | ICD-10-CM

## 2022-01-12 DIAGNOSIS — Z7901 Long term (current) use of anticoagulants: Secondary | ICD-10-CM | POA: Insufficient documentation

## 2022-01-12 DIAGNOSIS — W01198A Fall on same level from slipping, tripping and stumbling with subsequent striking against other object, initial encounter: Secondary | ICD-10-CM | POA: Diagnosis not present

## 2022-01-12 MED ORDER — LIDOCAINE 5 % EX PTCH: 1.0000 | MEDICATED_PATCH | Freq: Every day | CUTANEOUS | 0 refills | Status: DC | PRN

## 2022-01-12 MED ORDER — METOCLOPRAMIDE HCL 5 MG/ML IJ SOLN
5.0000 mg | Freq: Once | INTRAMUSCULAR | Status: AC
Start: 2022-01-12 — End: 2022-01-12
  Administered 2022-01-12: 5 mg via INTRAVENOUS
  Filled 2022-01-12: qty 2

## 2022-01-12 MED ORDER — TETANUS-DIPHTH-ACELL PERTUSSIS 5-2.5-18.5 LF-MCG/0.5 IM SUSY
0.5000 mL | PREFILLED_SYRINGE | Freq: Once | INTRAMUSCULAR | Status: AC
  Administered 2022-01-12: 0.5 mL via INTRAMUSCULAR
  Filled 2022-01-12: qty 0.5

## 2022-01-12 MED ORDER — BACITRACIN ZINC 500 UNIT/GM EX OINT
TOPICAL_OINTMENT | Freq: Once | CUTANEOUS | Status: DC
Start: 2022-01-12 — End: 2022-01-13
  Filled 2022-01-12: qty 0.9

## 2022-01-12 MED ORDER — LIDOCAINE 5 % EX PTCH
1.0000 | MEDICATED_PATCH | CUTANEOUS | Status: DC
  Filled 2022-01-12: qty 1

## 2022-01-12 MED ORDER — LOSARTAN POTASSIUM 25 MG PO TABS
100.0000 mg | ORAL_TABLET | Freq: Once | ORAL | Status: AC
  Administered 2022-01-12: 100 mg via ORAL
  Filled 2022-01-12: qty 4

## 2022-01-12 MED ORDER — DIPHENHYDRAMINE HCL 50 MG/ML IJ SOLN
25.0000 mg | Freq: Once | INTRAMUSCULAR | Status: AC
Start: 2022-01-12 — End: 2022-01-12
  Administered 2022-01-12: 25 mg via INTRAVENOUS
  Filled 2022-01-12: qty 1

## 2022-01-12 MED ORDER — ACETAMINOPHEN 325 MG PO TABS
650.0000 mg | ORAL_TABLET | Freq: Once | ORAL | Status: AC
  Administered 2022-01-12: 650 mg via ORAL
  Filled 2022-01-12: qty 2

## 2022-01-12 MED ORDER — CARVEDILOL 3.125 MG PO TABS
6.2500 mg | ORAL_TABLET | Freq: Once | ORAL | Status: AC
Start: 2022-01-12 — End: 2022-01-12
  Administered 2022-01-12: 6.25 mg via ORAL
  Filled 2022-01-12: qty 2

## 2022-01-12 NOTE — ED Triage Notes (Signed)
Patient presents post fall at work today. She tripped on the curb. As a result of the fall she hit her head and injured her right leg. There was no lose of consciousness. She now complains of  headache and bruising on the left side of her lip.    EMS vitals: 172/100 BP 61 HR 99% SPO2 on room air.  97 CBG

## 2022-01-12 NOTE — Discharge Instructions (Addendum)
It was a pleasure caring for you today in the emergency department.  Please return to the emergency department for any worsening or worrisome symptoms.  Please follow-up with your PCP regarding elevated blood pressure reading.  Based on the events which brought you to the ER today, it is possible that you may have a concussion. A concussion occurs when there is a blow to the head or body, with enough force to shake the brain and disrupt how the brain functions. You may experience symptoms such as headaches, sensitivity to light/noise, dizziness, cognitive slowing, difficulty concentrating / remembering, trouble sleeping and drowsiness. These symptoms may last anywhere from hours/days to potentially weeks/months. While these symptoms are very frustrating and perhaps debilitating, it is important that you remember that they will improve over time. Everyone has a different rate of recovery; it is difficult to predict when your symptoms will resolve. In order to allow for your brain to heal after the injury, we recommend that you see your primary physician or a physician knowledgeable in concussion management. We also advise you to let your body and brain rest: avoid physical activities (sports, gym, and exercise) and reduce cognitive demands (reading, texting, TV watching, computer use, video games, etc). School attendance, after-school activities and work may need to be modified to avoid increasing symptoms. We recommend against driving until until all symptoms have resolved. You should take 650mg  of Acetaminophen (Tylenol) every 4 hours as needed for pain control; however, taking anti-inflammatory medication (Motrin/Advil/Ibuprofen) is not advised. Come back to the ER right away if you are having repeated episodes of vomiting, severe/worsening headache/dizziness or any other symptom that alarms you. We recommended that someone stay with you for the next 24 hours to monitor for these worrisome symptoms.

## 2022-01-12 NOTE — ED Provider Notes (Signed)
[facility] DEPT Provider Note   CSN: [id number] Arrival date & time: 01/12/22  2202     History  Chief Complaint  Patient presents with   Fall   Headache   Leg Injury    Erin Cunningham is a 81 y.o. female.  This is a 81 y.o. female  with significant medical history as below, including HTN, afib on xarelto who presents to the ED with complaint of fall. Pt with fall 1 hour PTA. [employer] outside, tripped over curb, hit right knee and face on the ground, -LOC. She reports clear fluid draining out of her nose/left naris. No drainage from ears. -LOC, +thinners. Unsure last tetanus shot. Ambulatory since then. No numbness/tingling. No n/v, she has headache. BP elevated in triage, takes her BP meds at night time. No cp or dib.     Past Medical History: 2006: Breast CA ([place])     Comment:  right ([place]) No date: Cancer ([place]) 5427: Helicobacter pylori gastritis No date: High blood pressure 09/23/2017: History of adenomatous polyp of colon     Comment:  [place]ma 09/25/2017 and apparently polyps in the past as well              though recalled age No date: History of shingles 05/28/2020: Major depressive disorder 06/24/2015: Major depressive disorder, recurrent, severe without  psychotic features ([place]) 2014: Parotid mass     Comment:  right benign cyst 2006: Personal history of radiation therapy     Comment:  Rt breast 06/28/2020: Stroke (cerebrum) ([place])     Comment:  Per patient No date: Wears glasses  Past Surgical History: 1982: ABDOMINAL HYSTERECTOMY     Comment:  BSO 2007: BREAST LUMPECTOMY; Right 2005: BREAST LUMPECTOMY WITH SENTINEL LYMPH NODE BIOPSY     Comment:  right 2004: BREAST SURGERY     Comment:  rt br mass No date: CESAREAN SECTION No date: COLONOSCOPY 2001: ESOPHAGOGASTRODUODENOSCOPY     Comment:  H pylori gastritis No date: EYE SURGERY     Comment:  both cataracts 09/05/2013: PAROTIDECTOMY; Right     Comment:  Procedure: RIGHT  SUPERFICIAL PAROTIDECTOMY WITH FACIAL               NERVE DISECTION;  Surgeon: [name], MD;                Location: [facility];  Service: ENT;                Laterality: Right;  All documentation done by R.Ward               after [phone] --done under G. Garzon's password 2005: RE-EXCISION OF BREAST LUMPECTOMY     Comment:  right    The history is provided by the patient and the spouse. No language interpreter was used.  Fall Associated symptoms include headaches. Pertinent negatives include no chest pain, no abdominal pain and no shortness of breath.  Headache Associated symptoms: no abdominal pain, no back pain, no cough, no fever and no nausea       Home Medications Prior to Admission medications   Medication Sig Start Date End Date Taking? Authorizing Provider  acetaminophen (TYLENOL) 500 MG tablet Take 2 tablets (1,000 mg total) by mouth every 6 (six) hours as needed. 08/24/20   Charlesetta Shanks, MD  atorvastatin (LIPITOR) 80 MG tablet Take 1 tablet (80 mg total) by mouth daily. Patient not taking: Reported on 12/04/2021 04/30/21   Lauree Chandler,  NP  carvedilol (COREG) 6.25 MG tablet Take 1 tablet (6.25 mg total) by mouth 2 (two) times daily with a meal. 04/21/21   Dewaine Oats, Carlos American, NP  hydrochlorothiazide (HYDRODIURIL) 25 MG tablet Take 25 mg by mouth daily.    [provider]  losartan (COZAAR) 100 MG tablet Take 1 tablet (100 mg total) by mouth daily. 11/18/20   Lauree Chandler, NP  Menthol, Topical Analgesic, (BIOFREEZE) 4 % GEL Apply 3 oz topically 3 (three) times daily as needed. 09/26/21   Ngetich, Dinah C, NP  rivaroxaban (XARELTO) 20 MG TABS tablet Take 1 tablet (20 mg total) by mouth daily with supper. 12/12/21   [name], MD  sertraline (ZOLOFT) 50 MG tablet Take 1.5 tablets (75 mg total) by mouth daily. 11/03/21   Arfeen, Arlyce Harman, MD      Allergies    Patient has no known allergies.    Review of Systems   Review  of Systems  Constitutional:  Negative for activity change and fever.  HENT:  Negative for facial swelling and trouble swallowing.   Eyes:  Negative for discharge and redness.  Respiratory:  Negative for cough and shortness of breath.   Cardiovascular:  Negative for chest pain and palpitations.  Gastrointestinal:  Negative for abdominal pain and nausea.  Genitourinary:  Negative for dysuria and flank pain.  Musculoskeletal:  Positive for arthralgias. Negative for back pain and gait problem.  Skin:  Positive for wound. Negative for pallor and rash.  Neurological:  Positive for headaches. Negative for syncope.   Physical Exam Updated Vital Signs BP (!) 198/100    Pulse 77    Temp 98.3 F (36.8 C)    Resp 18    SpO2 99%  Physical Exam Vitals and nursing note reviewed.  Constitutional:      General: She is not in acute distress.    Appearance: Normal appearance.  HENT:     Head: Normocephalic and atraumatic. No raccoon eyes, Battle's sign, right periorbital erythema or left periorbital erythema.     Comments: No clear fluid drainage from naris or ears noted on my exam     Right Ear: External ear normal.     Left Ear: External ear normal.     Nose: Nose normal.     Mouth/Throat:     Mouth: Mucous membranes are moist.  Eyes:     General: No scleral icterus.       Right eye: No discharge.        Left eye: No discharge.     Extraocular Movements: Extraocular movements intact.     Pupils: Pupils are equal, round, and reactive to light.  Cardiovascular:     Rate and Rhythm: Normal rate and regular rhythm.     Pulses: Normal pulses.     Heart sounds: Normal heart sounds.  Pulmonary:     Effort: Pulmonary effort is normal. No respiratory distress.     Breath sounds: Normal breath sounds.  Abdominal:     General: Abdomen is flat.     Tenderness: There is no abdominal tenderness.  Musculoskeletal:        General: Normal range of motion.     Cervical back: Full passive range of motion  without pain and normal range of motion.     Right lower leg: No edema.     Left lower leg: No edema.  Skin:    General: Skin is warm and dry.     Capillary Refill: Capillary refill  takes less than 2 seconds.  Neurological:     Mental Status: She is alert and oriented to person, place, and time.     GCS: GCS eye subscore is 4. GCS verbal subscore is 5. GCS motor subscore is 6.     Cranial Nerves: Cranial nerves 2-12 are intact. No dysarthria or facial asymmetry.     Sensory: Sensation is intact.     Motor: Motor function is intact. No tremor.     Coordination: Coordination is intact.     Gait: Gait is intact.  Psychiatric:        Mood and Affect: Mood normal.        Behavior: Behavior normal.    ED Results / Procedures / Treatments   Labs (all labs ordered are listed, but only abnormal results are displayed) Labs Reviewed - No data to display  EKG None  Radiology CT HEAD WO CONTRAST (5MM)  Result Date: 01/12/2022 CLINICAL DATA:  [employer] at work today, tripped over curved striking head, headache, bruising to LEFT side of lip, no loss of consciousness, history coagulopathy, hypertension, breast cancer, stroke EXAM: CT HEAD WITHOUT CONTRAST CT MAXILLOFACIAL WITHOUT CONTRAST CT CERVICAL SPINE WITHOUT CONTRAST TECHNIQUE: Multidetector CT imaging of the head, cervical spine, and maxillofacial structures were performed using the standard protocol without intravenous contrast. Multiplanar CT image reconstructions of the cervical spine and maxillofacial structures were also generated. Right side of face marked with BB. RADIATION DOSE REDUCTION: This exam was performed according to the departmental dose-optimization program which includes automated exposure control, adjustment of the mA and/or kV according to patient size and/or use of iterative reconstruction technique. COMPARISON:  CT head 05/28/2020 FINDINGS: CT HEAD FINDINGS Brain: Mild prominence of the ventricular system, slightly larger LEFT  lateral ventricle than RIGHT, stable. No midline shift or mass effect. Small vessel chronic ischemic changes of deep cerebral white matter. Old LEFT basal ganglia lacunar infarct. No intracranial hemorrhage, mass lesion, or evidence of acute infarction. No extra-axial fluid collections. Vascular: No hyperdense vessels Skull: Intact Other: N/A CT MAXILLOFACIAL FINDINGS Osseous: Mild motion artifacts at mandible. TMJ alignment normal bilaterally. Orbits, zygomas, and sinuses intact. No facial bone fractures identified. Orbits: Bony orbits intact. Intraorbital soft tissue planes clear without fluid or pneumatosis Sinuses: Mucosal thickening throughout paranasal sinuses. Mucosal retention cyst LEFT maxillary sinus. Small amount of dependent fluid in RIGHT maxillary sinus. Visualized mastoid air cells and middle ear cavities clear. Soft tissues: Unremarkable CT CERVICAL SPINE FINDINGS Alignment: Normal Skull base and vertebrae: Osseous demineralization. Scattered mild disc space narrowing and endplate spur formation. Multilevel facet degenerative changes. Extensive calcified pannus surrounding a Don toy process. Vertebral body heights maintained. Skull base intact. No acute fracture, subluxation, or bone destruction. Soft tissues and spinal canal: Prevertebral soft tissues normal thickness. Mild atherosclerotic calcifications at the carotid bifurcations bilaterally as well as proximal great vessels. Disc levels:  No specific abnormalities Upper chest: Lung apices clear Other: N/A IMPRESSION: Atrophy with small vessel chronic ischemic changes of deep cerebral white matter. Old LEFT basal ganglia lacunar infarct. No acute intracranial abnormalities. No acute facial bone abnormalities. Scattered degenerative disc and facet disease changes of the cervical spine. No acute cervical spine abnormalities. Electronically Signed   By: Lavonia Dana M.D.   On: 01/12/2022 17:59   CT Cervical Spine Wo Contrast  Result Date:  01/12/2022 CLINICAL DATA:  [place] [address] at work today, tripped over curved striking head, headache, bruising to LEFT side of lip, no loss of consciousness, history coagulopathy, hypertension, breast  cancer, stroke EXAM: CT HEAD WITHOUT CONTRAST CT MAXILLOFACIAL WITHOUT CONTRAST CT CERVICAL SPINE WITHOUT CONTRAST TECHNIQUE: Multidetector CT imaging of the head, cervical spine, and maxillofacial structures were performed using the standard protocol without intravenous contrast. Multiplanar CT image reconstructions of the cervical spine and maxillofacial structures were also generated. Right side of face marked with BB. RADIATION DOSE REDUCTION: This exam was performed according to the departmental dose-optimization program which includes automated exposure control, adjustment of the mA and/or kV according to patient size and/or use of iterative reconstruction technique. COMPARISON:  CT head 05/28/2020 FINDINGS: CT HEAD FINDINGS Brain: Mild prominence of the ventricular system, slightly larger LEFT lateral ventricle than RIGHT, stable. No midline shift or mass effect. Small vessel chronic ischemic changes of deep cerebral white matter. Old LEFT basal ganglia lacunar infarct. No intracranial hemorrhage, mass lesion, or evidence of acute infarction. No extra-axial fluid collections. Vascular: No hyperdense vessels Skull: Intact Other: N/A CT MAXILLOFACIAL FINDINGS Osseous: Mild motion artifacts at mandible. TMJ alignment normal bilaterally. Orbits, zygomas, and sinuses intact. No facial bone fractures identified. Orbits: Bony orbits intact. Intraorbital soft tissue planes clear without fluid or pneumatosis Sinuses: Mucosal thickening throughout paranasal sinuses. Mucosal retention cyst LEFT maxillary sinus. Small amount of dependent fluid in RIGHT maxillary sinus. Visualized mastoid air cells and middle ear cavities clear. Soft tissues: Unremarkable CT CERVICAL SPINE FINDINGS Alignment: Normal Skull base and vertebrae: Osseous  demineralization. Scattered mild disc space narrowing and endplate spur formation. Multilevel facet degenerative changes. Extensive calcified pannus surrounding a Don toy process. Vertebral body heights maintained. Skull base intact. No acute fracture, subluxation, or bone destruction. Soft tissues and spinal canal: Prevertebral soft tissues normal thickness. Mild atherosclerotic calcifications at the carotid bifurcations bilaterally as well as proximal great vessels. Disc levels:  No specific abnormalities Upper chest: Lung apices clear Other: N/A IMPRESSION: Atrophy with small vessel chronic ischemic changes of deep cerebral white matter. Old LEFT basal ganglia lacunar infarct. No acute intracranial abnormalities. No acute facial bone abnormalities. Scattered degenerative disc and facet disease changes of the cervical spine. No acute cervical spine abnormalities. Electronically Signed   By: Lavonia Dana M.D.   On: 01/12/2022 17:59   DG Chest Portable 1 View  Result Date: 01/12/2022 CLINICAL DATA:  Status post fall, tripped on a curb EXAM: PORTABLE CHEST 1 VIEW COMPARISON:  12/17/2020 FINDINGS: Mild bilateral interstitial thickening. No focal consolidation. Right basilar atelectasis. Mild lingular scarring. No pleural effusion or pneumothorax. Stable cardiomegaly. Prominence of the right hilum likely reflecting prominent vasculature. No acute osseous abnormality. IMPRESSION: No active disease. Electronically Signed   By: Kathreen Devoid M.D.   On: 01/12/2022 17:40   DG Knee Complete 4 Views Right  Result Date: 01/12/2022 CLINICAL DATA:  Trauma.  Fall. EXAM: RIGHT KNEE - COMPLETE 4+ VIEW COMPARISON:  Right knee x-ray 08/30/2018. FINDINGS: No evidence of fracture, dislocation, or joint effusion. There is mild medial compartment and patellofemoral compartment joint space narrowing. There is tricompartmental osteophyte formation. There is mediolateral compartment chondrocalcinosis, likely degenerative. Soft tissues  are unremarkable. IMPRESSION: 1. No acute bony abnormality. 2. Moderate tricompartmental osteoarthrosis. Electronically Signed   By: Ronney Asters M.D.   On: 01/12/2022 17:41   CT Maxillofacial Wo Contrast  Result Date: 01/12/2022 CLINICAL DATA:  [place] at work today, tripped over curved striking head, headache, bruising to LEFT side of lip, no loss of consciousness, history coagulopathy, hypertension, breast cancer, stroke EXAM: CT HEAD WITHOUT CONTRAST CT MAXILLOFACIAL WITHOUT CONTRAST CT CERVICAL SPINE WITHOUT CONTRAST TECHNIQUE: Multidetector  CT imaging of the head, cervical spine, and maxillofacial structures were performed using the standard protocol without intravenous contrast. Multiplanar CT image reconstructions of the cervical spine and maxillofacial structures were also generated. Right side of face marked with BB. RADIATION DOSE REDUCTION: This exam was performed according to the departmental dose-optimization program which includes automated exposure control, adjustment of the mA and/or kV according to patient size and/or use of iterative reconstruction technique. COMPARISON:  CT head 05/28/2020 FINDINGS: CT HEAD FINDINGS Brain: Mild prominence of the ventricular system, slightly larger LEFT lateral ventricle than RIGHT, stable. No midline shift or mass effect. Small vessel chronic ischemic changes of deep cerebral white matter. Old LEFT basal ganglia lacunar infarct. No intracranial hemorrhage, mass lesion, or evidence of acute infarction. No extra-axial fluid collections. Vascular: No hyperdense vessels Skull: Intact Other: N/A CT MAXILLOFACIAL FINDINGS Osseous: Mild motion artifacts at mandible. TMJ alignment normal bilaterally. Orbits, zygomas, and sinuses intact. No facial bone fractures identified. Orbits: Bony orbits intact. Intraorbital soft tissue planes clear without fluid or pneumatosis Sinuses: Mucosal thickening throughout paranasal sinuses. Mucosal retention cyst LEFT maxillary sinus.  Small amount of dependent fluid in RIGHT maxillary sinus. Visualized mastoid air cells and middle ear cavities clear. Soft tissues: Unremarkable CT CERVICAL SPINE FINDINGS Alignment: Normal Skull base and vertebrae: Osseous demineralization. Scattered mild disc space narrowing and endplate spur formation. Multilevel facet degenerative changes. Extensive calcified pannus surrounding a Don toy process. Vertebral body heights maintained. Skull base intact. No acute fracture, subluxation, or bone destruction. Soft tissues and spinal canal: Prevertebral soft tissues normal thickness. Mild atherosclerotic calcifications at the carotid bifurcations bilaterally as well as proximal great vessels. Disc levels:  No specific abnormalities Upper chest: Lung apices clear Other: N/A IMPRESSION: Atrophy with small vessel chronic ischemic changes of deep cerebral white matter. Old LEFT basal ganglia lacunar infarct. No acute intracranial abnormalities. No acute facial bone abnormalities. Scattered degenerative disc and facet disease changes of the cervical spine. No acute cervical spine abnormalities. Electronically Signed   By: Lavonia Dana M.D.   On: 01/12/2022 17:59    Procedures Procedures    Medications Ordered in ED Medications  lidocaine (LIDODERM) 5 % 1 patch (1 patch Transdermal Patient Refused/Not Given 01/12/22 1856)  Tdap (BOOSTRIX) injection 0.5 mL (0.5 mLs Intramuscular Given 01/12/22 1759)  losartan (COZAAR) tablet 100 mg (100 mg Oral Given 01/12/22 1854)  carvedilol (COREG) tablet 6.25 mg (6.25 mg Oral Given 01/12/22 1854)  acetaminophen (TYLENOL) tablet 650 mg (650 mg Oral Given 01/12/22 1854)  metoCLOPramide (REGLAN) injection 5 mg (5 mg Intravenous Given 01/12/22 1905)  diphenhydrAMINE (BENADRYL) injection 25 mg (25 mg Intravenous Given 01/12/22 1906)    ED Course/ Medical Decision Making/ A&P                           Medical Decision Making Amount and/or Complexity of Data Reviewed Radiology:  ordered.  Risk OTC drugs. Prescription drug management.    CC: fall  This patient presents to the Emergency Department for the above complaint. This involves an extensive number of treatment options and is a complaint that carries with it a high risk of complications and morbidity. Vital signs were reviewed. Serious etiologies considered.  Record review:  Previous records obtained and reviewed   Additional history obtained from spouse  Medical and surgical history as noted above.   Work up as above, notable for:  Labs & imaging results that were available during my care of the  patient were reviewed by me and considered in my medical decision making.   I ordered imaging studies which included XT head/face/cspine, xr knee right and chest and I independently visualized and interpreted imaging which showed no acute fx.   Cardiac monitoring reviewed and interpreted personally which shows NSR  Social determinants of health include - N/a  Management: Migraine cocktail  Wound care  Reassessment:  Patient reports feeling significantly better.  No ongoing HA, rpt neuro exam is stable- no focal deficits. No vision changes, EOMI. no hearing changes.  She is tolerating PO.   No evidence of csf otorrhea or rhinorrhea.  Low suspicion for CSF rhinorrhea, strict return precautions were discussed   She is ambulatory, feels back to her baseline.   Patient presents with low mechanism head trauma. On initial evaluation patient appears in no acute distress, afebrile with normal vital signs. Patient has completely intact neurovascular exam, and pain improved in ED.  CT imaging pursued and found to be without acute pathology.  Tetanus updated in the ER.    Discussed possible etiology of concussion, signs and symptoms, and discharge instructions. Detailed discussions were had with the patient regarding current findings, and need for close f/u with PCP or on call doctor. The patient has been  instructed to return immediately if the symptoms worsen in any way for re-evaluation. Patient verbalized understanding and is in agreement with current care plan. All questions answered prior to discharge   This chart was dictated using voice recognition software.  Despite best efforts to proofread,  errors can occur which can change the documentation meaning.         Final Clinical Impression(s) / ED Diagnoses Final diagnoses:  Fall, initial encounter  Abrasion of right knee, initial encounter  Nonintractable headache, unspecified chronicity pattern, unspecified headache type  Closed head injury, initial encounter    Rx / DC Orders ED Discharge Orders     None         Jeanell Sparrow, DO 01/12/22 2018

## 2022-01-15 ENCOUNTER — Encounter: Payer: Self-pay | Admitting: Family

## 2022-01-15 ENCOUNTER — Ambulatory Visit (INDEPENDENT_AMBULATORY_CARE_PROVIDER_SITE_OTHER): Payer: BC Managed Care – PPO | Admitting: Family

## 2022-01-15 ENCOUNTER — Other Ambulatory Visit: Payer: Self-pay

## 2022-01-15 VITALS — BP 140/70 | HR 75 | Temp 97.7°F | Resp 16 | Ht 63.0 in | Wt 161.4 lb

## 2022-01-15 DIAGNOSIS — W19XXXD Unspecified fall, subsequent encounter: Secondary | ICD-10-CM

## 2022-01-15 DIAGNOSIS — M25561 Pain in right knee: Secondary | ICD-10-CM

## 2022-01-15 DIAGNOSIS — S80211D Abrasion, right knee, subsequent encounter: Secondary | ICD-10-CM | POA: Diagnosis not present

## 2022-01-15 NOTE — Progress Notes (Signed)
Provider: Neshia Mckenzie FNP-C  Lauree Chandler, NP  Patient Care Team: Lauree Chandler, NP as PCP - General (Geriatric Medicine) [name], MD (Inactive) as Consulting Physician (Otolaryngology)  Extended Emergency Contact Information Primary Emergency Contact: Bergren,[name] Address: [address]          [place], [place] [address] [name] of [place] Phone: [phone] Mobile Phone: [phone] Relation: Spouse Secondary Emergency Contact: [place] Phone: [phone] Mobile Phone: [phone] Relation: Son  Code Status:  Full Code  Goals of care: Advanced Directive information Advanced Directives 01/15/2022  Does Patient Have a Medical Advance Directive? No  Would patient like information on creating a medical advance directive? No - Patient declined  Some encounter information is confidential and restricted. Go to Review Flowsheets activity to see all data.     Chief Complaint  Patient presents with   Acute Visit    Patient complains of fall 01/12/2022. Scrapes on right knee.   Concern     Moderate Fall Risk.     HPI:  Pt is a 81 y.o. 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 but saw on coming traffic so she turned to another direction but fell instead onto the pavement.states landed on her right knee and face.she denies hiting her head or any LOC.she noticed clear drainage from her left nares but not ears.she was able to walk.Not taking any blood thinners.CT scan done in the ED showed no acute intracranial abnormalities or fracture.scattered degenerative disc and facet disease noted on cervical spine.Old left basal ganglia lacunar infarct noted.CT maxillofacial negative for fracture.chest X-ray negative.right knee X-ray negative for acute abnormality.Moderate tricompartmental Osteoarthrosis. She denies any acute issues  today.Has been cleaning right knee skin abrasion with Hydrogen peroxide and applying Neosporin.Has had no fever,chills or drainage.  Past Medical History:  Diagnosis Date   Breast CA ([facility]) 2006   right ([place])   Cancer ([facility])    Helicobacter pylori gastritis 2001   High blood pressure    History of adenomatous polyp of colon 09/23/2017   Oma 09/25/2017 and apparently polyps in the past as well though recalled age   History of shingles    Major depressive disorder 05/28/2020   Major depressive disorder, recurrent, severe without psychotic features ([place]) 06/24/2015   Parotid mass 2014   right benign cyst   Personal history of radiation therapy 2006   Rt breast   Stroke (cerebrum) ([facility]) 06/28/2020   Per patient   Wears glasses    Past Surgical History:  Procedure Laterality Date   ABDOMINAL HYSTERECTOMY  1982   BSO   BREAST LUMPECTOMY Right 2007   BREAST LUMPECTOMY WITH SENTINEL LYMPH NODE BIOPSY  2005   right   BREAST SURGERY  2004   rt br mass   CESAREAN SECTION     COLONOSCOPY     ESOPHAGOGASTRODUODENOSCOPY  2001   H pylori gastritis   EYE SURGERY     both cataracts   PAROTIDECTOMY Right 09/05/2013   Procedure: RIGHT SUPERFICIAL PAROTIDECTOMY WITH FACIAL NERVE DISECTION;  Surgeon: [name], MD;  Location: [facility];  Service: ENT;  Laterality: Right;  All documentation done by R.Ward after [phone] --done under G. Garzon's password   RE-EXCISION OF BREAST LUMPECTOMY  2005   right    No Known Allergies  Outpatient Encounter Medications as of 01/15/2022  Medication Sig   acetaminophen (TYLENOL) 500 MG tablet Take 2 tablets (1,000 mg total) by mouth every 6 (six) hours as needed.   atorvastatin (LIPITOR) 80 MG tablet Take 1 tablet (80 mg total) by mouth daily.   carvedilol (COREG) 6.25 MG tablet Take 1 tablet (6.25 mg total) by mouth 2 (two) times daily with a meal.   hydrochlorothiazide (HYDRODIURIL) 25 MG tablet Take 25 mg by mouth daily.    losartan (COZAAR) 100 MG tablet Take 1 tablet (100 mg total) by mouth daily.   Menthol, Topical Analgesic, (BIOFREEZE) 4 % GEL Apply 3 oz topically 3 (three) times daily as needed.   rivaroxaban (XARELTO) 20 MG TABS tablet Take 1 tablet (20 mg total) by mouth daily with supper.   sertraline (ZOLOFT) 50 MG tablet Take 1.5 tablets (75 mg total) by mouth daily.   [DISCONTINUED] lidocaine (LIDODERM) 5 % Place 1 patch onto the skin daily as needed. Remove & Discard patch within 12 hours or as directed by MD   No facility-administered encounter medications on file as of 01/15/2022.    Review of Systems  Constitutional:  Negative for appetite change, chills, fatigue, fever and unexpected weight change.  HENT:  Negative for congestion, dental problem, ear discharge, ear pain, facial swelling, hearing loss, nosebleeds, postnasal drip, rhinorrhea, sinus pressure, sinus pain, sneezing, sore throat, tinnitus and trouble swallowing.   Eyes:  Negative for pain, discharge, redness, itching and visual disturbance.  Respiratory:  Negative for cough, chest tightness, shortness of breath and wheezing.   Cardiovascular:  Negative for chest pain, palpitations and leg swelling.  Gastrointestinal:  Negative for abdominal distention, abdominal pain, blood in stool, constipation, diarrhea, nausea and vomiting.  Endocrine: Negative for cold intolerance, heat intolerance, polydipsia, polyphagia and polyuria.  Genitourinary:  Negative for difficulty urinating, dysuria, flank pain, frequency and urgency.  Musculoskeletal:  Negative for arthralgias, back pain, gait problem, joint swelling, myalgias, neck pain and neck stiffness.  Skin:  Negative for color change, pallor, rash and wound.       Right knee skin abrasion   Neurological:  Negative for dizziness, syncope, speech difficulty, weakness, light-headedness, numbness and headaches.  Hematological:  Does not bruise/bleed easily.  Psychiatric/Behavioral:  Negative for  agitation, behavioral problems, confusion, hallucinations, self-injury, sleep disturbance and suicidal ideas. The patient is not nervous/anxious.    Immunization History  Administered Date(s) Administered   Fluad Quad(high Dose 65+) 01/15/2020, 12/20/2020   Influenza, High Dose Seasonal PF 09/14/2018   Influenza,inj,Quad PF,6+ Mos 11/06/2015   PFIZER(Purple Top)SARS-COV-2 Vaccination 02/03/2020, 02/24/2020   PPD Test 12/10/2015   Pneumococcal Conjugate-13 01/16/2015   Pneumococcal Polysaccharide-23 05/12/2017   Tdap 06/15/2018, 01/12/2022   Zoster, Unspecified 12/05/2014   Pertinent  Health Maintenance Due  Topic Date Due   INFLUENZA VACCINE  07/07/2021   DEXA SCAN  Completed   Fall Risk 09/26/2021 11/04/2021 11/13/2021 01/12/2022 01/15/2022  Falls in the past year? 0 0 0 - 1  Was there an injury with Fall? 0 0 0 - 1  Fall Risk Category Calculator 0 0 0 - 2  Fall Risk Category Low Low Low - Moderate  Patient Fall Risk Level Low fall risk Low fall risk Low fall risk Low fall risk Moderate fall risk  Patient at Risk for Falls Due to No Fall Risks No Fall Risks No Fall Risks - History of fall(s)  Fall risk Follow up Falls evaluation completed Falls evaluation completed Falls evaluation completed;Education provided;Falls prevention discussed - Falls evaluation completed;Education provided;Falls prevention discussed  Functional Status Survey:    Vitals:   01/15/22 1112  BP: 140/70  Pulse: 75  Resp: 16  Temp: 97.7 F (36.5 C)  SpO2: 98%  Weight: 161 lb 6.4 oz (73.2 kg)  Height: 5\' 3"  (1.6 m)   Body mass index is 28.59 kg/m. Physical Exam Vitals reviewed.  Constitutional:      General: She is not in acute distress.    Appearance: Normal appearance. She is normal weight. She is not ill-appearing or diaphoretic.  HENT:     Head: Normocephalic.     Right Ear: Tympanic membrane, ear canal and external ear normal. There is no impacted cerumen.     Left Ear: Tympanic membrane, ear  canal and external ear normal. There is no impacted cerumen.     Nose: Nose normal. No congestion or rhinorrhea.     Mouth/Throat:     Mouth: Mucous membranes are moist.     Pharynx: Oropharynx is clear. No oropharyngeal exudate or posterior oropharyngeal erythema.  Eyes:     General: No scleral icterus.       Right eye: No discharge.        Left eye: No discharge.     Conjunctiva/sclera: Conjunctivae normal.     Pupils: Pupils are equal, round, and reactive to light.  Neck:     Vascular: No carotid bruit.  Cardiovascular:     Rate and Rhythm: Normal rate and regular rhythm.     Pulses: Normal pulses.     Heart sounds: Normal heart sounds. No murmur heard.   No friction rub. No gallop.  Pulmonary:     Effort: Pulmonary effort is normal. No respiratory distress.     Breath sounds: Normal breath sounds. No wheezing, rhonchi or rales.  Chest:     Chest wall: No tenderness.  Abdominal:     General: Bowel sounds are normal. There is no distension.     Palpations: Abdomen is soft. There is no mass.     Tenderness: There is no abdominal tenderness. There is no right CVA tenderness, left CVA tenderness, guarding or rebound.  Musculoskeletal:        General: No swelling or tenderness. Normal range of motion.     Cervical back: Normal range of motion. No rigidity or tenderness.     Right lower leg: No edema.     Left lower leg: No edema.  Lymphadenopathy:     Cervical: No cervical adenopathy.  Skin:    General: Skin is warm and dry.     Coloration: Skin is not pale.     Findings: No erythema, lesion or rash.     Comments: Right knee abrasion with multiple small scattered scab areas.abrasion cleansed with saline ,pat dry and triple antibiotic ointment applied.Tolerated procedure well.  Neurological:     Mental Status: She is alert and oriented to person, place, and time.     Cranial Nerves: No cranial nerve deficit.     Sensory: No sensory deficit.     Motor: No weakness.      Coordination: Coordination normal.     Gait: Gait normal.  Psychiatric:        Mood and Affect: Mood normal.        Speech: Speech normal.        Behavior: Behavior normal.        Thought Content: Thought content normal.        Judgment: Judgment normal.    Labs reviewed: Recent Labs  04/21/21 1137 11/17/21 1038  NA 138 141  K 4.0 5.2  CL 107 104  CO2 23 25  GLUCOSE 89 96  BUN 17 15  CREATININE 0.92 1.02*  CALCIUM 10.7* 11.2*   Recent Labs    04/21/21 1137  AST 14  ALT 9  BILITOT 0.5  PROT 6.4   Recent Labs    11/17/21 1038  WBC 7.3  HGB 13.2  HCT 39.9  MCV 87  PLT 444   Lab Results  Component Value Date   TSH 1.09 06/13/2018   Lab Results  Component Value Date   HGBA1C 5.6 05/29/2020   Lab Results  Component Value Date   CHOL 189 04/21/2021   HDL 51 04/21/2021   LDLCALC 115 (H) 04/21/2021   TRIG 122 04/21/2021   CHOLHDL 3.7 04/21/2021    Significant Diagnostic Results in last 30 days:  CT HEAD WO CONTRAST (5MM)  Result Date: 01/12/2022 CLINICAL DATA:  [place] at work today, tripped over curved striking head, headache, bruising to LEFT side of lip, no loss of consciousness, history coagulopathy, hypertension, breast cancer, stroke EXAM: CT HEAD WITHOUT CONTRAST CT MAXILLOFACIAL WITHOUT CONTRAST CT CERVICAL SPINE WITHOUT CONTRAST TECHNIQUE: Multidetector CT imaging of the head, cervical spine, and maxillofacial structures were performed using the standard protocol without intravenous contrast. Multiplanar CT image reconstructions of the cervical spine and maxillofacial structures were also generated. Right side of face marked with BB. RADIATION DOSE REDUCTION: This exam was performed according to the departmental dose-optimization program which includes automated exposure control, adjustment of the mA and/or kV according to patient size and/or use of iterative reconstruction technique. COMPARISON:  CT head 05/28/2020 FINDINGS: CT HEAD FINDINGS Brain: Mild  prominence of the ventricular system, slightly larger LEFT lateral ventricle than RIGHT, stable. No midline shift or mass effect. Small vessel chronic ischemic changes of deep cerebral white matter. Old LEFT basal ganglia lacunar infarct. No intracranial hemorrhage, mass lesion, or evidence of acute infarction. No extra-axial fluid collections. Vascular: No hyperdense vessels Skull: Intact Other: N/A CT MAXILLOFACIAL FINDINGS Osseous: Mild motion artifacts at mandible. TMJ alignment normal bilaterally. Orbits, zygomas, and sinuses intact. No facial bone fractures identified. Orbits: Bony orbits intact. Intraorbital soft tissue planes clear without fluid or pneumatosis Sinuses: Mucosal thickening throughout paranasal sinuses. Mucosal retention cyst LEFT maxillary sinus. Small amount of dependent fluid in RIGHT maxillary sinus. Visualized mastoid air cells and middle ear cavities clear. Soft tissues: Unremarkable CT CERVICAL SPINE FINDINGS Alignment: Normal Skull base and vertebrae: Osseous demineralization. Scattered mild disc space narrowing and endplate spur formation. Multilevel facet degenerative changes. Extensive calcified pannus surrounding a Don toy process. Vertebral body heights maintained. Skull base intact. No acute fracture, subluxation, or bone destruction. Soft tissues and spinal canal: Prevertebral soft tissues normal thickness. Mild atherosclerotic calcifications at the carotid bifurcations bilaterally as well as proximal great vessels. Disc levels:  No specific abnormalities Upper chest: Lung apices clear Other: N/A IMPRESSION: Atrophy with small vessel chronic ischemic changes of deep cerebral white matter. Old LEFT basal ganglia lacunar infarct. No acute intracranial abnormalities. No acute facial bone abnormalities. Scattered degenerative disc and facet disease changes of the cervical spine. No acute cervical spine abnormalities. Electronically Signed   By: Lavonia Dana M.D.   On: 01/12/2022 17:59    CT Cervical Spine Wo Contrast  Result Date: 01/12/2022 CLINICAL DATA:  [place] at work today, tripped over curved striking head, headache, bruising to LEFT side of lip, no loss of consciousness, history coagulopathy, hypertension, breast cancer,  stroke EXAM: CT HEAD WITHOUT CONTRAST CT MAXILLOFACIAL WITHOUT CONTRAST CT CERVICAL SPINE WITHOUT CONTRAST TECHNIQUE: Multidetector CT imaging of the head, cervical spine, and maxillofacial structures were performed using the standard protocol without intravenous contrast. Multiplanar CT image reconstructions of the cervical spine and maxillofacial structures were also generated. Right side of face marked with BB. RADIATION DOSE REDUCTION: This exam was performed according to the departmental dose-optimization program which includes automated exposure control, adjustment of the mA and/or kV according to patient size and/or use of iterative reconstruction technique. COMPARISON:  CT head 05/28/2020 FINDINGS: CT HEAD FINDINGS Brain: Mild prominence of the ventricular system, slightly larger LEFT lateral ventricle than RIGHT, stable. No midline shift or mass effect. Small vessel chronic ischemic changes of deep cerebral white matter. Old LEFT basal ganglia lacunar infarct. No intracranial hemorrhage, mass lesion, or evidence of acute infarction. No extra-axial fluid collections. Vascular: No hyperdense vessels Skull: Intact Other: N/A CT MAXILLOFACIAL FINDINGS Osseous: Mild motion artifacts at mandible. TMJ alignment normal bilaterally. Orbits, zygomas, and sinuses intact. No facial bone fractures identified. Orbits: Bony orbits intact. Intraorbital soft tissue planes clear without fluid or pneumatosis Sinuses: Mucosal thickening throughout paranasal sinuses. Mucosal retention cyst LEFT maxillary sinus. Small amount of dependent fluid in RIGHT maxillary sinus. Visualized mastoid air cells and middle ear cavities clear. Soft tissues: Unremarkable CT CERVICAL SPINE FINDINGS  Alignment: Normal Skull base and vertebrae: Osseous demineralization. Scattered mild disc space narrowing and endplate spur formation. Multilevel facet degenerative changes. Extensive calcified pannus surrounding a Don toy process. Vertebral body heights maintained. Skull base intact. No acute fracture, subluxation, or bone destruction. Soft tissues and spinal canal: Prevertebral soft tissues normal thickness. Mild atherosclerotic calcifications at the carotid bifurcations bilaterally as well as proximal great vessels. Disc levels:  No specific abnormalities Upper chest: Lung apices clear Other: N/A IMPRESSION: Atrophy with small vessel chronic ischemic changes of deep cerebral white matter. Old LEFT basal ganglia lacunar infarct. No acute intracranial abnormalities. No acute facial bone abnormalities. Scattered degenerative disc and facet disease changes of the cervical spine. No acute cervical spine abnormalities. Electronically Signed   By: Lavonia Dana M.D.   On: 01/12/2022 17:59   DG Chest Portable 1 View  Result Date: 01/12/2022 CLINICAL DATA:  Status post fall, tripped on a curb EXAM: PORTABLE CHEST 1 VIEW COMPARISON:  12/17/2020 FINDINGS: Mild bilateral interstitial thickening. No focal consolidation. Right basilar atelectasis. Mild lingular scarring. No pleural effusion or pneumothorax. Stable cardiomegaly. Prominence of the right hilum likely reflecting prominent vasculature. No acute osseous abnormality. IMPRESSION: No active disease. Electronically Signed   By: Kathreen Devoid M.D.   On: 01/12/2022 17:40   DG Knee Complete 4 Views Right  Result Date: 01/12/2022 CLINICAL DATA:  Trauma.  Fall. EXAM: RIGHT KNEE - COMPLETE 4+ VIEW COMPARISON:  Right knee x-ray 08/30/2018. FINDINGS: No evidence of fracture, dislocation, or joint effusion. There is mild medial compartment and patellofemoral compartment joint space narrowing. There is tricompartmental osteophyte formation. There is mediolateral compartment  chondrocalcinosis, likely degenerative. Soft tissues are unremarkable. IMPRESSION: 1. No acute bony abnormality. 2. Moderate tricompartmental osteoarthrosis. Electronically Signed   By: Ronney Asters M.D.   On: 01/12/2022 17:41   CT Maxillofacial Wo Contrast  Result Date: 01/12/2022 CLINICAL DATA:  [place] at work today, tripped over curved striking head, headache, bruising to LEFT side of lip, no loss of consciousness, history coagulopathy, hypertension, breast cancer, stroke EXAM: CT HEAD WITHOUT CONTRAST CT MAXILLOFACIAL WITHOUT CONTRAST CT CERVICAL SPINE WITHOUT CONTRAST TECHNIQUE: Multidetector CT  imaging of the head, cervical spine, and maxillofacial structures were performed using the standard protocol without intravenous contrast. Multiplanar CT image reconstructions of the cervical spine and maxillofacial structures were also generated. Right side of face marked with BB. RADIATION DOSE REDUCTION: This exam was performed according to the departmental dose-optimization program which includes automated exposure control, adjustment of the mA and/or kV according to patient size and/or use of iterative reconstruction technique. COMPARISON:  CT head 05/28/2020 FINDINGS: CT HEAD FINDINGS Brain: Mild prominence of the ventricular system, slightly larger LEFT lateral ventricle than RIGHT, stable. No midline shift or mass effect. Small vessel chronic ischemic changes of deep cerebral white matter. Old LEFT basal ganglia lacunar infarct. No intracranial hemorrhage, mass lesion, or evidence of acute infarction. No extra-axial fluid collections. Vascular: No hyperdense vessels Skull: Intact Other: N/A CT MAXILLOFACIAL FINDINGS Osseous: Mild motion artifacts at mandible. TMJ alignment normal bilaterally. Orbits, zygomas, and sinuses intact. No facial bone fractures identified. Orbits: Bony orbits intact. Intraorbital soft tissue planes clear without fluid or pneumatosis Sinuses: Mucosal thickening throughout paranasal  sinuses. Mucosal retention cyst LEFT maxillary sinus. Small amount of dependent fluid in RIGHT maxillary sinus. Visualized mastoid air cells and middle ear cavities clear. Soft tissues: Unremarkable CT CERVICAL SPINE FINDINGS Alignment: Normal Skull base and vertebrae: Osseous demineralization. Scattered mild disc space narrowing and endplate spur formation. Multilevel facet degenerative changes. Extensive calcified pannus surrounding a Don toy process. Vertebral body heights maintained. Skull base intact. No acute fracture, subluxation, or bone destruction. Soft tissues and spinal canal: Prevertebral soft tissues normal thickness. Mild atherosclerotic calcifications at the carotid bifurcations bilaterally as well as proximal great vessels. Disc levels:  No specific abnormalities Upper chest: Lung apices clear Other: N/A IMPRESSION: Atrophy with small vessel chronic ischemic changes of deep cerebral white matter. Old LEFT basal ganglia lacunar infarct. No acute intracranial abnormalities. No acute facial bone abnormalities. Scattered degenerative disc and facet disease changes of the cervical spine. No acute cervical spine abnormalities. Electronically Signed   By: Lavonia Dana M.D.   On: 01/12/2022 17:59    Assessment/Plan 1. Abrasion of right knee, subsequent encounter Afebrile  Right knee abrasion with multiple small scattered scab areas.abrasion cleansed with saline ,pat dry and triple antibiotic ointment applied.Tolerated procedure well. - advised to cleanse abrasion with antibacterial soap and apply TAO daily. Notify provider for any signs of infection   2. Fall, subsequent encounter Status post ED visit for fall. - Fall an safety precaution   3. Acute pain of right knee Continue on extra strength Tylenol for pain  X-ray negative for fracture   Family/ staff Communication: Reviewed plan of care with patient verbalized understanding.  Labs/tests ordered: None   Next Appointment:As needed if  symptoms worsen or fail to improve     Sandrea Hughs, NP

## 2022-01-15 NOTE — Patient Instructions (Signed)
Call and schedule appointment for evaluation of new eye glasses.

## 2022-02-02 ENCOUNTER — Telehealth (HOSPITAL_BASED_OUTPATIENT_CLINIC_OR_DEPARTMENT_OTHER): Payer: BC Managed Care – PPO | Admitting: Psychiatry

## 2022-02-02 ENCOUNTER — Encounter (HOSPITAL_COMMUNITY): Payer: Self-pay | Admitting: Psychiatry

## 2022-02-02 ENCOUNTER — Other Ambulatory Visit: Payer: Self-pay

## 2022-02-02 DIAGNOSIS — F332 Major depressive disorder, recurrent severe without psychotic features: Secondary | ICD-10-CM

## 2022-02-02 DIAGNOSIS — F419 Anxiety disorder, unspecified: Secondary | ICD-10-CM

## 2022-02-02 MED ORDER — SERTRALINE HCL 50 MG PO TABS: 75.0000 mg | ORAL_TABLET | Freq: Every day | ORAL | 0 refills | Status: DC

## 2022-02-02 NOTE — Progress Notes (Signed)
Virtual Visit via Telephone Note  I connected with Erin Cunningham on 02/02/22 at  2:20 PM EST by telephone and verified that I am speaking with the correct person using two identifiers.  Location: Patient: Home Provider: Home Office   I discussed the limitations, risks, security and privacy concerns of performing an evaluation and management service by telephone and the availability of in person appointments. I also discussed with the patient that there may be a patient responsible charge related to this service. The patient expressed understanding and agreed to proceed.   History of Present Illness: Patient is evaluated by phone session.  She had a fall 2 weeks ago when she tripped at work.  Likely no major injury but she has to go to the emergency room.  She is feeling better.  She started driving and back to work.  She feels anxiety and depression is a stable.  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  She is happy with her job at keep herself busy.  She was happy that both of her son came to see her on Christmas.  She has a daughter who lives in [place] but she is not in communication with her.  She reported her husband is the same but she handles and manage very well.  She denies any crying spells or any feeling of hopelessness or worthlessness.  She denies any panic attack.  Her appetite is okay.  Energy level is good.  She like to keep the current medication.   Past Psychiatric History: Reviewed H/O depression and anxiety.  One inpatient at [facility] for suicidal thoughts.  H/O brief stay in OBS unit in 2016.  No h/o mania, psychosis, hallucination or suicidal attempt.  Psychiatric Specialty Exam: Physical Exam  Review of Systems  Weight 161 lb (73 kg).There is no height or weight on file to calculate BMI.  General Appearance: NA  Eye Contact:  NA  Speech:  Normal Rate  Volume:  Normal  Mood:  Euthymic  Affect:  NA  Thought Process:  Goal Directed  Orientation:   Full (Time, Place, and Person)  Thought Content:  Logical  Suicidal Thoughts:  No  Homicidal Thoughts:  No  Memory:  Immediate;   Good Recent;   Good Remote;   Fair  Judgement:  Intact  Insight:  Present  Psychomotor Activity:  NA  Concentration:  Concentration: Good and Attention Span: Good  Recall:  Good  Fund of Knowledge:  Good  Language:  Good  Akathisia:  No  Handed:  Right  AIMS (if indicated):     Assets:  Communication Skills Desire for Improvement Housing Resilience Transportation  ADL's:  Intact  Cognition:  WNL  Sleep:   ok      Assessment and Plan: Major depressive disorder, recurrent.  Anxiety.  Continue Zoloft 75 mg daily.  Discussed medication side effects and benefits.  Recommended to call us back if she has any question or any concern.  Follow-up in 3 months.  Follow Up Instructions:    I discussed the assessment and treatment plan with the patient. The patient was provided an opportunity to ask questions and all were answered. The patient agreed with the plan and demonstrated an understanding of the instructions.   The patient was advised to call back or seek an in-person evaluation if the symptoms worsen or if the condition fails to improve as anticipated.  I provided 13 minutes of non-face-to-face time during this encounter.  [name], MD

## 2022-02-16 ENCOUNTER — Encounter: Payer: Self-pay | Admitting: Nurse Practitioner

## 2022-02-16 ENCOUNTER — Encounter: Payer: BC Managed Care – PPO | Admitting: Nurse Practitioner

## 2022-02-17 NOTE — Progress Notes (Signed)
Pt cancelled appt

## 2022-03-05 DIAGNOSIS — E785 Hyperlipidemia, unspecified: Secondary | ICD-10-CM | POA: Insufficient documentation

## 2022-03-06 ENCOUNTER — Encounter: Payer: Self-pay | Admitting: Nurse Practitioner

## 2022-03-06 ENCOUNTER — Ambulatory Visit (INDEPENDENT_AMBULATORY_CARE_PROVIDER_SITE_OTHER): Payer: BC Managed Care – PPO | Admitting: Nurse Practitioner

## 2022-03-06 VITALS — BP 150/76 | HR 66 | Temp 97.5°F | Ht 63.0 in | Wt 158.0 lb

## 2022-03-06 DIAGNOSIS — E785 Hyperlipidemia, unspecified: Secondary | ICD-10-CM

## 2022-03-06 DIAGNOSIS — I10 Essential (primary) hypertension: Secondary | ICD-10-CM | POA: Diagnosis not present

## 2022-03-06 DIAGNOSIS — I517 Cardiomegaly: Secondary | ICD-10-CM

## 2022-03-06 DIAGNOSIS — Z8673 Personal history of transient ischemic attack (TIA), and cerebral infarction without residual deficits: Secondary | ICD-10-CM

## 2022-03-06 DIAGNOSIS — F332 Major depressive disorder, recurrent severe without psychotic features: Secondary | ICD-10-CM

## 2022-03-06 DIAGNOSIS — N952 Postmenopausal atrophic vaginitis: Secondary | ICD-10-CM

## 2022-03-06 DIAGNOSIS — I480 Paroxysmal atrial fibrillation: Secondary | ICD-10-CM | POA: Diagnosis not present

## 2022-03-06 DIAGNOSIS — I700 Atherosclerosis of aorta: Secondary | ICD-10-CM

## 2022-03-06 MED ORDER — ESTRADIOL 0.1 MG/GM VA CREA: 1.0000 | TOPICAL_CREAM | VAGINAL | 1 refills | Status: DC

## 2022-03-06 NOTE — Patient Instructions (Signed)
To start zyrtec 10 mg by mouth daily  ?

## 2022-03-06 NOTE — Progress Notes (Signed)
? ? ?Careteam: ?Patient Care Team: ?Erin Chandler, NP as PCP - General (Geriatric Medicine) ?[name], MD as PCP - Cardiology (Cardiology) ?[name], MD (Inactive) as Consulting Physician (Otolaryngology) ? ?PLACE OF SERVICE:  ?[facility]  ?Advanced Directive information ?Does Patient Have a Medical Advance Directive?: No, Would patient like information on creating a medical advance directive?: Yes (MAU/Ambulatory/Procedural Areas - Information given) ? ?No Known Allergies ? ?Chief Complaint  ?Patient presents with  ? Medical Management of Chronic Issues  ?  4 month follow-up. Discuss need for shingrix and additional covid booster or post pone if patient refuses. Patient denies receiving any vaccines since last visit. [facility]IR verified. Phlegm and cough x several months. Patient questions why she has so many b/p pills.   ? ? ? ?HPI: Patient is a 81 y.o. female for 4 months follow up.  ? ?Patient would like to try something for vaginal dryness, Has tried [place] jelly and it is not helping. Denies pain, itchiness. Hx of hysterectomy .  ? ?Hypertension-  Home Bp runs about 170's/90's.  Checks Bp before taking medication.  ?Takes all blood pressure medications (carvedilol, HCTZ, and losartan) in the evening after 5pm.  ? ?Atrial Fibrillation- Does have occasional heart palpitations. Takes Xarelto 20 mg at supper. Takes it daily. No issues or concerns about medication.  ? ?Depression- Sertraline working great. No issues so far.  ? ?Hyperlipidemia - Lipitor 80 mg, no issues. Takes it daily.  ? ?Will get it Shingrix and Covid booster  when convenient .  ? ?  ?Review of Systems:  ?Review of Systems  ?Constitutional:  Negative for chills, diaphoresis, fever and malaise/fatigue.  ?Respiratory:  Negative for cough, shortness of breath and wheezing.   ?Cardiovascular:  Positive for palpitations. Negative for chest pain and leg swelling.  ?     Has A. Fibrillation , rapid HR when feeling stressed.    ?Gastrointestinal:  Negative for abdominal pain, constipation, diarrhea, heartburn, nausea and vomiting.  ?Genitourinary:  Negative for dysuria, frequency and urgency.  ?Psychiatric/Behavioral:  The patient does not have insomnia.   ? ?Past Medical History:  ?Diagnosis Date  ? Breast CA ([place]) 2006  ? right [facility])  ? Cancer [facility])   ? Helicobacter pylori gastritis 2001  ? High blood pressure   ? History of adenomatous polyp of colon 09/23/2017  ? Oma 09/25/2017 and apparently polyps in the past as well though recalled age  ? History of shingles   ? Major depressive disorder 05/28/2020  ? Major depressive disorder, recurrent, severe without psychotic features ([place]) 06/24/2015  ? Parotid mass 2014  ? right benign cyst  ? Personal history of radiation therapy 2006  ? Rt breast  ? Stroke (cerebrum) ([place]) 06/28/2020  ? Per patient  ? Wears glasses   ? ?Past Surgical History:  ?Procedure Laterality Date  ? ABDOMINAL HYSTERECTOMY  1982  ? BSO  ? BREAST LUMPECTOMY Right 2007  ? BREAST LUMPECTOMY WITH SENTINEL LYMPH NODE BIOPSY  2005  ? right  ? BREAST SURGERY  2004  ? rt br mass  ? CESAREAN SECTION    ? COLONOSCOPY    ? ESOPHAGOGASTRODUODENOSCOPY  2001  ? H pylori gastritis  ? EYE SURGERY    ? both cataracts  ? PAROTIDECTOMY Right 09/05/2013  ? Procedure: RIGHT SUPERFICIAL PAROTIDECTOMY WITH FACIAL NERVE DISECTION;  Surgeon: [name], MD;  Location: [place];  Service: ENT;  Laterality: Right;  All documentation done by R.Ward after  0851 --done under G. Garzon's password  ? RE-EXCISION OF BREAST LUMPECTOMY  2005  ? right  ? ?Social History: ?  reports that she has never smoked. Her smokeless tobacco use includes chew. She reports that she does not currently use alcohol. She reports that she does not use drugs. ? ?Family History  ?Problem Relation Age of Onset  ? Heart disease Mother   ?     MI  ? Cancer Father   ?     bone  ? Thyroid disease Neg Hx   ? Hypercalcemia Neg Hx   ? Colon cancer Neg Hx    ? Stomach cancer Neg Hx   ? Rectal cancer Neg Hx   ? Esophageal cancer Neg Hx   ? ? ?Medications: ?Patient's Medications  ?New Prescriptions  ? ESTRADIOL (ESTRACE VAGINAL) 0.1 MG/GM VAGINAL CREAM    Place 1 Applicatorful vaginally 3 (three) times a week.  ?Previous Medications  ? ACETAMINOPHEN (TYLENOL) 500 MG TABLET    Take 2 tablets (1,000 mg total) by mouth every 6 (six) hours as needed.  ? ATORVASTATIN (LIPITOR) 80 MG TABLET    Take 1 tablet (80 mg total) by mouth daily.  ? CARVEDILOL (COREG) 6.25 MG TABLET    Take 1 tablet (6.25 mg total) by mouth 2 (two) times daily with a meal.  ? HYDROCHLOROTHIAZIDE (HYDRODIURIL) 25 MG TABLET    Take 25 mg by mouth daily.  ? LOSARTAN (COZAAR) 100 MG TABLET    Take 1 tablet (100 mg total) by mouth daily.  ? RIVAROXABAN (XARELTO) 20 MG TABS TABLET    Take 1 tablet (20 mg total) by mouth daily with supper.  ? SERTRALINE (ZOLOFT) 50 MG TABLET    Take 1.5 tablets (75 mg total) by mouth daily.  ?Modified Medications  ? No medications on file  ?Discontinued Medications  ? MENTHOL, TOPICAL ANALGESIC, (BIOFREEZE) 4 % GEL    Apply 3 oz topically 3 (three) times daily as needed.  ? ? ?Physical Exam: ? ?Vitals:  ? 03/06/22 1037  ?BP: (!) 144/80  ?Pulse: 66  ?Temp: (!) 97.5 ?F (36.4 ?C)  ?TempSrc: Temporal  ?SpO2: 99%  ?Weight: 158 lb (71.7 kg)  ?Height: '5\' 3"'  (1.6 m)  ? ?Body mass index is 27.99 kg/m?. ?Wt Readings from Last 3 Encounters:  ?03/06/22 158 lb (71.7 kg)  ?01/15/22 161 lb 6.4 oz (73.2 kg)  ?11/17/21 162 lb 8 oz (73.7 kg)  ? ? ?Physical Exam ?Constitutional:   ?   Appearance: Normal appearance. She is normal weight.  ?HENT:  ?   Right Ear: Tympanic membrane, ear canal and external ear normal.  ?   Left Ear: Tympanic membrane, ear canal and external ear normal.  ?   Mouth/Throat:  ?   Mouth: Mucous membranes are moist.  ?   Pharynx: Oropharynx is clear.  ?Eyes:  ?   Conjunctiva/sclera: Conjunctivae normal.  ?Cardiovascular:  ?   Rate and Rhythm: Normal rate and regular  rhythm.  ?   Pulses: Normal pulses.  ?   Heart sounds: Normal heart sounds.  ?Pulmonary:  ?   Effort: Pulmonary effort is normal.  ?   Breath sounds: Normal breath sounds.  ?Abdominal:  ?   General: Bowel sounds are normal.  ?   Palpations: Abdomen is soft.  ?Neurological:  ?   Mental Status: She is alert and oriented to person, place, and time.  ?Psychiatric:     ?   Mood and Affect: Mood normal.     ?  Behavior: Behavior normal.     ?   Thought Content: Thought content normal.     ?   Judgment: Judgment normal.  ? ?Labs reviewed: ?Basic Metabolic Panel: ?Recent Labs  ?  04/21/21 ?1137 11/17/21 ?1038  ?NA 138 141  ?K 4.0 5.2  ?CL 107 104  ?CO2 23 25  ?GLUCOSE 89 96  ?BUN 17 15  ?CREATININE 0.92 1.02*  ?CALCIUM 10.7* 11.2*  ? ?Liver Function Tests: ?Recent Labs  ?  04/21/21 ?1137  ?AST 14  ?ALT 9  ?BILITOT 0.5  ?PROT 6.4  ? ?No results for input(s): LIPASE, AMYLASE in the last 8760 hours. ?No results for input(s): AMMONIA in the last 8760 hours. ?CBC: ?Recent Labs  ?  11/17/21 ?1038  ?WBC 7.3  ?HGB 13.2  ?HCT 39.9  ?MCV 87  ?PLT 444  ? ?Lipid Panel: ?Recent Labs  ?  04/21/21 ?1137  ?CHOL 189  ?HDL 51  ?LDLCALC 115*  ?TRIG 122  ?CHOLHDL 3.7  ? ?TSH: ?No results for input(s): TSH in the last 8760 hours. ?A1C: ?Lab Results  ?Component Value Date  ? HGBA1C 5.6 05/29/2020  ? ? ? ?Assessment/Plan ?1. Paroxysmal atrial fibrillation (HCC) ?- Takes Xarelto 20 mg QD ?-  Sometimes have rapid HR and subsides on its own.  ?-- CBC with Differential/Platelet ?- CMP with eGFR([facility]) ?- Stable condition  ? ?2. LVH (left ventricular hypertrophy) ?- Pt has been taking carvedilol once daily.  ?- Advised to take Carvedilol 6.25 mg BID  ?-Stable without worsening of symptoms at [facility] stimme ? ?3. Essential hypertension ?- Bp in office is 144/80; Repeat Bp was 150/76.  ?- Takes Losartan 100 mg QD, Coreg 6.25 mg, HCTZ 25 mg QD, she takes medication daily and has not taken today.  ?- CBC with Differential/Platelet ?- CMP with eGFR([facility]) ?-  Advised to take Carvedilol BID (QAM and QPM) ?-HCTZ (QAM), Losartan 100 mg QPM after work.  ?- Low sodium diet is encouraged.  ?- Not controlled, will continue to monitor.  ? ?4. Major depressive disorder, r

## 2022-03-07 LAB — COMPLETE METABOLIC PANEL WITH GFR
AG Ratio: 1.5 (calc) (ref 1.0–2.5)
ALT: 8 U/L (ref 6–29)
AST: 13 U/L (ref 10–35)
Albumin: 4 g/dL (ref 3.6–5.1)
Alkaline phosphatase (APISO): 99 U/L (ref 37–153)
BUN: 19 mg/dL (ref 7–25)
CO2: 27 mmol/L (ref 20–32)
Calcium: 11.4 mg/dL — ABNORMAL HIGH (ref 8.6–10.4)
Chloride: 107 mmol/L (ref 98–110)
Creat: 0.94 mg/dL (ref 0.60–0.95)
Globulin: 2.6 g/dL (calc) (ref 1.9–3.7)
Glucose, Bld: 126 mg/dL (ref 65–139)
Potassium: 4 mmol/L (ref 3.5–5.3)
Sodium: 141 mmol/L (ref 135–146)
Total Bilirubin: 0.4 mg/dL (ref 0.2–1.2)
Total Protein: 6.6 g/dL (ref 6.1–8.1)
eGFR: 61 mL/min/{1.73_m2} (ref >=60)

## 2022-03-07 LAB — LIPID PANEL
Cholesterol: 210 mg/dL — ABNORMAL HIGH (ref <200)
HDL: 49 mg/dL — ABNORMAL LOW (ref >=50)
LDL Cholesterol (Calc): 135 mg/dL (calc) — ABNORMAL HIGH
Non-HDL Cholesterol (Calc): 161 mg/dL (calc) — ABNORMAL HIGH (ref <130)
Total CHOL/HDL Ratio: 4.3 (calc) (ref <5.0)
Triglycerides: 135 mg/dL (ref <150)

## 2022-03-07 LAB — CBC WITH DIFFERENTIAL/PLATELET
Absolute Monocytes: 464 cells/uL (ref 200–950)
Basophils Absolute: 122 cells/uL (ref 0–200)
Basophils Relative: 2 %
Eosinophils Absolute: 268 cells/uL (ref 15–500)
Eosinophils Relative: 4.4 %
HCT: 40.7 % (ref 35.0–45.0)
Hemoglobin: 13.2 g/dL (ref 11.7–15.5)
Lymphs Abs: 1952 cells/uL (ref 850–3900)
MCH: 28.7 pg (ref 27.0–33.0)
MCHC: 32.4 g/dL (ref 32.0–36.0)
MCV: 88.5 fL (ref 80.0–100.0)
MPV: 11.9 fL (ref 7.5–12.5)
Monocytes Relative: 7.6 %
Neutro Abs: 3294 cells/uL (ref 1500–7800)
Neutrophils Relative %: 54 %
Platelets: 336 10*3/uL (ref 140–400)
RBC: 4.6 10*6/uL (ref 3.80–5.10)
RDW: 12.7 % (ref 11.0–15.0)
Total Lymphocyte: 32 %
WBC: 6.1 10*3/uL (ref 3.8–10.8)

## 2022-03-09 ENCOUNTER — Other Ambulatory Visit: Payer: Self-pay

## 2022-03-09 NOTE — Addendum Note (Signed)
Addended by: Lauree Chandler on: 03/09/2022 02:04 PM ? ? Modules accepted: Orders ? ?

## 2022-03-09 NOTE — Addendum Note (Signed)
Addended by: Logan Bores on: 03/09/2022 03:35 PM ? ? Modules accepted: Orders ? ?

## 2022-03-16 ENCOUNTER — Other Ambulatory Visit: Payer: Medicare Other

## 2022-03-17 ENCOUNTER — Other Ambulatory Visit: Payer: Self-pay | Admitting: Nurse Practitioner

## 2022-03-17 DIAGNOSIS — I10 Essential (primary) hypertension: Secondary | ICD-10-CM

## 2022-03-17 LAB — PTH, INTACT AND CALCIUM
Calcium: 10.2 mg/dL (ref 8.6–10.4)
PTH: 89 pg/mL — ABNORMAL HIGH (ref 16–77)

## 2022-03-17 MED ORDER — HYDRALAZINE HCL 25 MG PO TABS: 25.0000 mg | ORAL_TABLET | Freq: Three times a day (TID) | ORAL | 1 refills | Status: DC

## 2022-04-02 NOTE — Progress Notes (Signed)
? ? ?Careteam: ?Patient Care Team: ?Lauree Chandler, NP as PCP - General (Geriatric Medicine) ?[name], MD as PCP - Cardiology (Cardiology) ?[name], MD (Inactive) as Consulting Physician (Otolaryngology) ? ?PLACE OF SERVICE:  ?[facility]  ?Advanced Directive information ?Does Patient Have a Medical Advance Directive?: No, Would patient like information on creating a medical advance directive?: Yes (MAU/Ambulatory/Procedural Areas - Information given) (Provided at previous visit) ? ?No Known Allergies ? ?Chief Complaint  ?Patient presents with  ? Follow-up  ?  2 week blood pressure follow-up and lab recheck. Patient has not taken medication yet   ? ? ? ?HPI: Patient is a 81 y.o. female for follow up blood pressure ?Did not bring blood pressure readings and did not take medication this morning.  ? ?PTH mildly elevated on last labs, calcium level normal range.  ?Denies palpitations, tremors, facial twitching, muscle or bone pain ? ? ?Review of Systems:  ?Review of Systems  ?Constitutional:  Negative for chills, fever and weight loss.  ?HENT:  Negative for tinnitus.   ?Respiratory:  Negative for cough, sputum production and shortness of breath.   ?Cardiovascular:  Negative for chest pain, palpitations and leg swelling.  ?Gastrointestinal:  Negative for abdominal pain, constipation, diarrhea and heartburn.  ?Genitourinary:  Negative for dysuria, frequency and urgency.  ?Musculoskeletal:  Negative for back pain, falls, joint pain and myalgias.  ?Skin: Negative.   ?Neurological:  Negative for dizziness and headaches.  ?Psychiatric/Behavioral:  Negative for depression and memory loss. The patient does not have insomnia.   ? ?Past Medical History:  ?Diagnosis Date  ? Breast CA ([place]) 2006  ? right [facility])  ? Cancer [facility])   ? Helicobacter pylori gastritis 2001  ? High blood pressure   ? History of adenomatous polyp of colon 09/23/2017  ? Oma 09/25/2017 and apparently polyps in the past as  well though recalled age  ? History of shingles   ? Major depressive disorder 05/28/2020  ? Major depressive disorder, recurrent, severe without psychotic features ([place]) 06/24/2015  ? Parotid mass 2014  ? right benign cyst  ? Personal history of radiation therapy 2006  ? Rt breast  ? Stroke (cerebrum) ([place]) 06/28/2020  ? Per patient  ? Wears glasses   ? ?Past Surgical History:  ?Procedure Laterality Date  ? ABDOMINAL HYSTERECTOMY  1982  ? BSO  ? BREAST LUMPECTOMY Right 2007  ? BREAST LUMPECTOMY WITH SENTINEL LYMPH NODE BIOPSY  2005  ? right  ? BREAST SURGERY  2004  ? rt br mass  ? CESAREAN SECTION    ? COLONOSCOPY    ? ESOPHAGOGASTRODUODENOSCOPY  2001  ? H pylori gastritis  ? EYE SURGERY    ? both cataracts  ? PAROTIDECTOMY Right 09/05/2013  ? Procedure: RIGHT SUPERFICIAL PAROTIDECTOMY WITH FACIAL NERVE DISECTION;  Surgeon: [name], MD;  Location: [place];  Service: ENT;  Laterality: Right;  All documentation done by R.Ward after [phone] --done under G. Garzon's password  ? RE-EXCISION OF BREAST LUMPECTOMY  2005  ? right  ? ?Social History: ?  reports that she has never smoked. Her smokeless tobacco use includes chew. She reports current alcohol use. She reports that she does not use drugs. ? ?Family History  ?Problem Relation Age of Onset  ? Heart disease Mother   ?     MI  ? Cancer Father   ?     bone  ? Thyroid disease Neg Hx   ? Hypercalcemia Neg Hx   ?  Colon cancer Neg Hx   ? Stomach cancer Neg Hx   ? Rectal cancer Neg Hx   ? Esophageal cancer Neg Hx   ? ? ?Medications: ?Patient's Medications  ?New Prescriptions  ? No medications on file  ?Previous Medications  ? ACETAMINOPHEN (TYLENOL) 500 MG TABLET    Take 2 tablets (1,000 mg total) by mouth every 6 (six) hours as needed.  ? ATORVASTATIN (LIPITOR) 80 MG TABLET    Take 1 tablet (80 mg total) by mouth daily.  ? CARVEDILOL (COREG) 6.25 MG TABLET    Take 1 tablet (6.25 mg total) by mouth 2 (two) times daily with a meal.  ? ESTRADIOL  (ESTRACE VAGINAL) 0.1 MG/GM VAGINAL CREAM    Place 1 Applicatorful vaginally 3 (three) times a week.  ? HYDRALAZINE (APRESOLINE) 25 MG TABLET    Take 1 tablet (25 mg total) by mouth 3 (three) times daily.  ? LOSARTAN (COZAAR) 100 MG TABLET    Take 1 tablet (100 mg total) by mouth daily.  ? RIVAROXABAN (XARELTO) 20 MG TABS TABLET    Take 1 tablet (20 mg total) by mouth daily with supper.  ? SERTRALINE (ZOLOFT) 50 MG TABLET    Take 1.5 tablets (75 mg total) by mouth daily.  ?Modified Medications  ? No medications on file  ?Discontinued Medications  ? No medications on file  ? ? ?Physical Exam: ? ?Vitals:  ? 04/03/22 0903  ?BP: (!) 150/88  ?Pulse: 81  ?Temp: (!) 97.1 ?F (36.2 ?C)  ?TempSrc: Temporal  ?SpO2: 97%  ?Weight: 159 lb (72.1 kg)  ?Height: '5\' 3"'$  (1.6 m)  ? ?Body mass index is 28.17 kg/m?. ?Wt Readings from Last 3 Encounters:  ?04/03/22 159 lb (72.1 kg)  ?03/06/22 158 lb (71.7 kg)  ?01/15/22 161 lb 6.4 oz (73.2 kg)  ? ? ?Physical Exam ?Constitutional:   ?   General: She is not in acute distress. ?   Appearance: She is well-developed. She is not diaphoretic.  ?HENT:  ?   Head: Normocephalic and atraumatic.  ?   Mouth/Throat:  ?   Pharynx: No oropharyngeal exudate.  ?Eyes:  ?   Conjunctiva/sclera: Conjunctivae normal.  ?   Pupils: Pupils are equal, round, and reactive to light.  ?Cardiovascular:  ?   Rate and Rhythm: Normal rate and regular rhythm.  ?   Heart sounds: Normal heart sounds.  ?Pulmonary:  ?   Effort: Pulmonary effort is normal.  ?   Breath sounds: Normal breath sounds.  ?Abdominal:  ?   General: Bowel sounds are normal.  ?   Palpations: Abdomen is soft.  ?Musculoskeletal:  ?   Cervical back: Normal range of motion and neck supple.  ?   Right lower leg: No edema.  ?   Left lower leg: No edema.  ?Skin: ?   General: Skin is warm and dry.  ?Neurological:  ?   Mental Status: She is alert.  ?Psychiatric:     ?   Mood and Affect: Mood normal.  ? ? ?Labs reviewed: ?Basic Metabolic Panel: ?Recent Labs  ?   04/21/21 ?1137 11/17/21 ?1038 03/06/22 ?1500 03/16/22 ?1052  ?NA 138 141 141  --   ?K 4.0 5.2 4.0  --   ?CL 107 104 107  --   ?CO2 '23 25 27  '$ --   ?GLUCOSE 89 96 126  --   ?BUN '17 15 19  '$ --   ?CREATININE 0.92 1.02* 0.94  --   ?CALCIUM 10.7* 11.2* 11.4* 10.2  ? ?  Liver Function Tests: ?Recent Labs  ?  04/21/21 ?1137 03/06/22 ?1500  ?AST 14 13  ?ALT 9 8  ?BILITOT 0.5 0.4  ?PROT 6.4 6.6  ? ?No results for input(s): LIPASE, AMYLASE in the last 8760 hours. ?No results for input(s): AMMONIA in the last 8760 hours. ?CBC: ?Recent Labs  ?  11/17/21 ?1038 03/06/22 ?1500  ?WBC 7.3 6.1  ?NEUTROABS  --  3,294  ?HGB 13.2 13.2  ?HCT 39.9 40.7  ?MCV 87 88.5  ?PLT 444 336  ? ?Lipid Panel: ?Recent Labs  ?  04/21/21 ?1137 03/06/22 ?1500  ?CHOL 189 210*  ?HDL 51 49*  ?LDLCALC 115* 135*  ?TRIG 122 135  ?CHOLHDL 3.7 4.3  ? ?TSH: ?No results for input(s): TSH in the last 8760 hours. ?A1C: ?Lab Results  ?Component Value Date  ? HGBA1C 5.6 05/29/2020  ? ? ? ?Assessment/Plan ?1. Essential hypertension ?-has stopped HCTZ, on hydralazine but only taking twice daily ?-tolerating medication without side effects  ?-to increase to three times daily and to take blood pressure ~1 hour after medication ?-she will take medication prior to next office visit and bring readings over the next week ?- BASIC METABOLIC PANEL WITH GFR ? ?2. Hypercalcemia ?-will follow up today ?- BASIC METABOLIC PANEL WITH GFR ? ?To follow up in 1 week for BP check ?Carlos American. Dewaine Oats, AGNP ? ?[facility] ?[phone]  ?

## 2022-04-03 ENCOUNTER — Ambulatory Visit (INDEPENDENT_AMBULATORY_CARE_PROVIDER_SITE_OTHER): Payer: BC Managed Care – PPO | Admitting: Nurse Practitioner

## 2022-04-03 ENCOUNTER — Encounter: Payer: Self-pay | Admitting: Nurse Practitioner

## 2022-04-03 VITALS — BP 150/88 | HR 81 | Temp 97.1°F | Ht 63.0 in | Wt 159.0 lb

## 2022-04-03 DIAGNOSIS — I10 Essential (primary) hypertension: Secondary | ICD-10-CM

## 2022-04-03 NOTE — Patient Instructions (Signed)
To follow up in 1 week on blood pressure- make sure you take medication prior to visit so we can see if new mediation is working well.  ? ? ?

## 2022-04-04 LAB — BASIC METABOLIC PANEL WITH GFR
BUN: 15 mg/dL (ref 7–25)
CO2: 28 mmol/L (ref 20–32)
Calcium: 10.5 mg/dL — ABNORMAL HIGH (ref 8.6–10.4)
Chloride: 108 mmol/L (ref 98–110)
Creat: 0.84 mg/dL (ref 0.60–0.95)
Glucose, Bld: 78 mg/dL (ref 65–99)
Potassium: 4.4 mmol/L (ref 3.5–5.3)
Sodium: 141 mmol/L (ref 135–146)
eGFR: 70 mL/min/{1.73_m2} (ref >=60)

## 2022-04-09 NOTE — Progress Notes (Signed)
? ? ?Careteam: ?Patient Care Team: ?Lauree Chandler, NP as PCP - General (Geriatric Medicine) ?[name], MD as PCP - Cardiology (Cardiology) ?[name], MD (Inactive) as Consulting Physician (Otolaryngology) ? ?PLACE OF SERVICE:  ?[facility]  ?Advanced Directive information ?  ? ?No Known Allergies ? ?Chief Complaint  ?Patient presents with  ? Follow-up  ?  1 week follow up on blood pressure.Patient states that she has not taking her medication this morning. Patient states bp this morning was 175/103.Patient states blood pressure is high at night.  ? ? ? ?HPI: Patient is a 81 y.o. female for blood pressure follow up.  ? ?She has not taken blood pressure medication this morning.  ?She generally takes medication in the evening and only once a day. She is not taking hydralazine three times daily or coreg twice daily - which was reviewed with her at the last appt last week.  ?Reports she does not eat breakfast generally.  ? ?Feels dizzy at times.  ?No chest pains or shortness of breath ?Blood pressure runs high at home.  ? ?Not taking lipitor ? ?Does not eat routinely  ? ? ?Review of Systems:  ?Review of Systems  ?Constitutional:  Negative for chills, fever and weight loss.  ?HENT:  Negative for tinnitus.   ?Respiratory:  Negative for cough, sputum production and shortness of breath.   ?Cardiovascular:  Negative for chest pain, palpitations and leg swelling.  ?Gastrointestinal:  Negative for abdominal pain, constipation, diarrhea and heartburn.  ?Genitourinary:  Negative for dysuria, frequency and urgency.  ?Musculoskeletal:  Negative for back pain, falls, joint pain and myalgias.  ?Skin: Negative.   ?Neurological:  Negative for dizziness and headaches.  ?Psychiatric/Behavioral:  Negative for depression and memory loss. The patient does not have insomnia.   ? ?Past Medical History:  ?Diagnosis Date  ? Breast CA ([place]) 2006  ? right [facility])  ? Cancer [facility])   ? Helicobacter pylori gastritis  2001  ? High blood pressure   ? History of adenomatous polyp of colon 09/23/2017  ? Oma 09/25/2017 and apparently polyps in the past as well though recalled age  ? History of shingles   ? Major depressive disorder 05/28/2020  ? Major depressive disorder, recurrent, severe without psychotic features ([place]) 06/24/2015  ? Parotid mass 2014  ? right benign cyst  ? Personal history of radiation therapy 2006  ? Rt breast  ? Stroke (cerebrum) ([place]) 06/28/2020  ? Per patient  ? Wears glasses   ? ?Past Surgical History:  ?Procedure Laterality Date  ? ABDOMINAL HYSTERECTOMY  1982  ? BSO  ? BREAST LUMPECTOMY Right 2007  ? BREAST LUMPECTOMY WITH SENTINEL LYMPH NODE BIOPSY  2005  ? right  ? BREAST SURGERY  2004  ? rt br mass  ? CESAREAN SECTION    ? COLONOSCOPY    ? ESOPHAGOGASTRODUODENOSCOPY  2001  ? H pylori gastritis  ? EYE SURGERY    ? both cataracts  ? PAROTIDECTOMY Right 09/05/2013  ? Procedure: RIGHT SUPERFICIAL PAROTIDECTOMY WITH FACIAL NERVE DISECTION;  Surgeon: [name], MD;  Location: No Name;  Service: ENT;  Laterality: Right;  All documentation done by R.Ward after [phone] --done under G. Garzon's password  ? RE-EXCISION OF BREAST LUMPECTOMY  2005  ? right  ? ?Social History: ?  reports that she has never smoked. Her smokeless tobacco use includes chew. She reports current alcohol use. She reports that she does not use drugs. ? ?Family History  ?  Problem Relation Age of Onset  ? Heart disease Mother   ?     MI  ? Cancer Father   ?     bone  ? Thyroid disease Neg Hx   ? Hypercalcemia Neg Hx   ? Colon cancer Neg Hx   ? Stomach cancer Neg Hx   ? Rectal cancer Neg Hx   ? Esophageal cancer Neg Hx   ? ? ?Medications: ?Patient's Medications  ?New Prescriptions  ? No medications on file  ?Previous Medications  ? ACETAMINOPHEN (TYLENOL) 500 MG TABLET    Take 2 tablets (1,000 mg total) by mouth every 6 (six) hours as needed.  ? ATORVASTATIN (LIPITOR) 80 MG TABLET    Take 1 tablet (80 mg total) by mouth  daily.  ? CARVEDILOL (COREG) 6.25 MG TABLET    Take 1 tablet (6.25 mg total) by mouth 2 (two) times daily with a meal.  ? HYDRALAZINE (APRESOLINE) 25 MG TABLET    Take 1 tablet (25 mg total) by mouth 3 (three) times daily.  ? LOSARTAN (COZAAR) 100 MG TABLET    Take 1 tablet (100 mg total) by mouth daily.  ? RIVAROXABAN (XARELTO) 20 MG TABS TABLET    Take 1 tablet (20 mg total) by mouth daily with supper.  ? SERTRALINE (ZOLOFT) 50 MG TABLET    Take 1.5 tablets (75 mg total) by mouth daily.  ?Modified Medications  ? No medications on file  ?Discontinued Medications  ? ESTRADIOL (ESTRACE VAGINAL) 0.1 MG/GM VAGINAL CREAM    Place 1 Applicatorful vaginally 3 (three) times a week.  ? ? ?Physical Exam: ? ?Vitals:  ? 04/10/22 0808 04/10/22 1302  ?BP: (!) 180/93 (!) 150/88  ?Pulse: 76   ?SpO2: 98%   ?Height: '5\' 3"'$  (1.6 m)   ? ?There is no height or weight on file to calculate BMI. ?Wt Readings from Last 3 Encounters:  ?04/03/22 159 lb (72.1 kg)  ?03/06/22 158 lb (71.7 kg)  ?01/15/22 161 lb 6.4 oz (73.2 kg)  ? ? ?Physical Exam ?Constitutional:   ?   General: She is not in acute distress. ?   Appearance: She is well-developed. She is not diaphoretic.  ?HENT:  ?   Head: Normocephalic and atraumatic.  ?Eyes:  ?   Conjunctiva/sclera: Conjunctivae normal.  ?   Pupils: Pupils are equal, round, and reactive to light.  ?Cardiovascular:  ?   Rate and Rhythm: Normal rate and regular rhythm.  ?   Heart sounds: Normal heart sounds.  ?Pulmonary:  ?   Effort: Pulmonary effort is normal.  ?   Breath sounds: Normal breath sounds.  ?Musculoskeletal:  ?   Cervical back: Normal range of motion and neck supple.  ?   Right lower leg: No edema.  ?   Left lower leg: No edema.  ?Skin: ?   General: Skin is warm and dry.  ?Neurological:  ?   Mental Status: She is alert and oriented to person, place, and time.  ?Psychiatric:     ?   Mood and Affect: Mood normal.  ? ? ?Labs reviewed: ?Basic Metabolic Panel: ?Recent Labs  ?  11/17/21 ?1038 03/06/22 ?1500  03/16/22 ?1052 04/03/22 ?1020  ?NA 141 141  --  141  ?K 5.2 4.0  --  4.4  ?CL 104 107  --  108  ?CO2 25 27  --  28  ?GLUCOSE 96 126  --  78  ?BUN 15 19  --  15  ?CREATININE 1.02* 0.94  --  0.84  ?CALCIUM 11.2* 11.4* 10.2 10.5*  ? ?Liver Function Tests: ?Recent Labs  ?  04/21/21 ?1137 03/06/22 ?1500  ?AST 14 13  ?ALT 9 8  ?BILITOT 0.5 0.4  ?PROT 6.4 6.6  ? ?No results for input(s): LIPASE, AMYLASE in the last 8760 hours. ?No results for input(s): AMMONIA in the last 8760 hours. ?CBC: ?Recent Labs  ?  11/17/21 ?1038 03/06/22 ?1500  ?WBC 7.3 6.1  ?NEUTROABS  --  3,294  ?HGB 13.2 13.2  ?HCT 39.9 40.7  ?MCV 87 88.5  ?PLT 444 336  ? ?Lipid Panel: ?Recent Labs  ?  04/21/21 ?1137 03/06/22 ?1500  ?CHOL 189 210*  ?HDL 51 49*  ?LDLCALC 115* 135*  ?TRIG 122 135  ?CHOLHDL 3.7 4.3  ? ?TSH: ?No results for input(s): TSH in the last 8760 hours. ?A1C: ?Lab Results  ?Component Value Date  ? HGBA1C 5.6 05/29/2020  ? ? ? ?Assessment/Plan ?1. Essential hypertension ?-went through medication again education provided about frequency. Encouraged pill boxes to help.  ?-dietary modifications encouraged ?- hydrALAZINE (APRESOLINE) 25 MG tablet; Take 1 tablet (25 mg total) by mouth 3 (three) times daily.  Dispense: 90 tablet; Refill: 1 ? ?2. Hypercalcemia ?-ongoing, stable, will continue to monitor.  ? ?3. Major depressive disorder, recurrent severe without psychotic features ([place]) ?-followed by psych  ?- sertraline (ZOLOFT) 50 MG tablet; Take 1.5 tablets (75 mg total) by mouth daily.  Dispense: 135 tablet; Refill: 0 ? ?5. Hyperlipidemia LDL goal <70 ?Discussed importance of compliance of medication- she has not been taking. Refill provided.  ?- atorvastatin (LIPITOR) 80 MG tablet; Take 1 tablet (80 mg total) by mouth daily.  Dispense: 90 tablet; Refill: 1 ? ? ? ?7-10 days on blood pressure  ?Carlos American. Dewaine Oats, AGNP ? ?[facility] ?[phone]  ?

## 2022-04-10 ENCOUNTER — Encounter: Payer: Self-pay | Admitting: Nurse Practitioner

## 2022-04-10 ENCOUNTER — Ambulatory Visit (INDEPENDENT_AMBULATORY_CARE_PROVIDER_SITE_OTHER): Payer: BC Managed Care – PPO | Admitting: Nurse Practitioner

## 2022-04-10 VITALS — BP 150/88 | HR 76 | Ht 63.0 in

## 2022-04-10 DIAGNOSIS — I10 Essential (primary) hypertension: Secondary | ICD-10-CM

## 2022-04-10 DIAGNOSIS — E785 Hyperlipidemia, unspecified: Secondary | ICD-10-CM

## 2022-04-10 DIAGNOSIS — F332 Major depressive disorder, recurrent severe without psychotic features: Secondary | ICD-10-CM

## 2022-04-10 MED ORDER — ATORVASTATIN CALCIUM 80 MG PO TABS: 80.0000 mg | ORAL_TABLET | Freq: Every day | ORAL | 1 refills | Status: DC

## 2022-04-10 MED ORDER — HYDRALAZINE HCL 50 MG PO TABS: 50.0000 mg | ORAL_TABLET | Freq: Three times a day (TID) | ORAL | 1 refills | Status: DC

## 2022-04-10 MED ORDER — SERTRALINE HCL 50 MG PO TABS: 75.0000 mg | ORAL_TABLET | Freq: Every day | ORAL | 0 refills | Status: DC

## 2022-04-10 MED ORDER — HYDRALAZINE HCL 25 MG PO TABS: 25.0000 mg | ORAL_TABLET | Freq: Three times a day (TID) | ORAL | 1 refills | Status: DC

## 2022-04-10 NOTE — Patient Instructions (Signed)
PUT YOUR MEDICATION IN PILL BOXES- do this EVERY week.  ?2 pill boxes- 1 for the morning and 1 for the evening ? ? ? ?In the morning when you brush your teeth take ?Coreg ?Hydralazine ?Losartan  ?Xarelto  ?Zoloft ? ?At 2 pm take hydralazine ? ?In the evening when you brush your teeth take ?Lipitor  ?Coreg ?Hydralazine ? ?

## 2022-04-13 ENCOUNTER — Ambulatory Visit: Payer: Medicare Other | Admitting: Nurse Practitioner

## 2022-04-20 ENCOUNTER — Ambulatory Visit (INDEPENDENT_AMBULATORY_CARE_PROVIDER_SITE_OTHER): Payer: BC Managed Care – PPO | Admitting: Nurse Practitioner

## 2022-04-20 ENCOUNTER — Encounter: Payer: Self-pay | Admitting: Nurse Practitioner

## 2022-04-20 DIAGNOSIS — I10 Essential (primary) hypertension: Secondary | ICD-10-CM | POA: Diagnosis not present

## 2022-04-20 DIAGNOSIS — E785 Hyperlipidemia, unspecified: Secondary | ICD-10-CM

## 2022-04-20 MED ORDER — LOSARTAN POTASSIUM 100 MG PO TABS: 100.0000 mg | ORAL_TABLET | Freq: Every day | ORAL | 1 refills | Status: DC

## 2022-04-20 NOTE — Progress Notes (Signed)
? ? ?Careteam: ?Patient Care Team: ?Lauree Chandler, NP as PCP - General (Geriatric Medicine) ?[name], MD as PCP - Cardiology (Cardiology) ?[name], MD (Inactive) as Consulting Physician (Otolaryngology) ? ?PLACE OF SERVICE:  ?[facility]  ?Advanced Directive information ?Does Patient Have a Medical Advance Directive?: No, Would patient like information on creating a medical advance directive?: Yes (MAU/Ambulatory/Procedural Areas - Information given) (Provided at previous visit) ? ?No Known Allergies ? ?Chief Complaint  ?Patient presents with  ? Follow-up  ?  Blood pressure follow-up. Patient ran out of Losartan 100 mg x 2 days   ? ? ? ?HPI: Patient is a 81 y.o. female for follow up on blood pressure.  ?Reports she does not think she is taking blood pressure correctly at home ?Hurts when she takes her bp ? ?She is looking at chart now and not using pill boxes.  ?She is taking coreg twice daily and hydralazine three times daily now ? ?No side effects from medications.  ? ?Reports she is resting better now that she is taking medication correctly.   ? ? ?Review of Systems:  ?Review of Systems  ?Constitutional:  Negative for chills, fever and weight loss.  ?HENT:  Negative for tinnitus.   ?Respiratory:  Negative for cough, sputum production and shortness of breath.   ?Cardiovascular:  Negative for chest pain, palpitations and leg swelling.  ?Gastrointestinal:  Negative for abdominal pain, constipation, diarrhea and heartburn.  ?Genitourinary:  Negative for dysuria, frequency and urgency.  ?Skin: Negative.   ?Neurological:  Negative for dizziness and headaches.  ?Psychiatric/Behavioral:  Negative for depression and memory loss. The patient does not have insomnia.   ? ?Past Medical History:  ?Diagnosis Date  ? Breast CA ([place]) 2006  ? right [facility])  ? Cancer [facility])   ? Helicobacter pylori gastritis 2001  ? High blood pressure   ? History of adenomatous polyp of colon 09/23/2017  ? Oma  09/25/2017 and apparently polyps in the past as well though recalled age  ? History of shingles   ? Major depressive disorder 05/28/2020  ? Major depressive disorder, recurrent, severe without psychotic features ([place]) 06/24/2015  ? Parotid mass 2014  ? right benign cyst  ? Personal history of radiation therapy 2006  ? Rt breast  ? Stroke (cerebrum) ([place]) 06/28/2020  ? Per patient  ? Wears glasses   ? ?Past Surgical History:  ?Procedure Laterality Date  ? ABDOMINAL HYSTERECTOMY  1982  ? BSO  ? BREAST LUMPECTOMY Right 2007  ? BREAST LUMPECTOMY WITH SENTINEL LYMPH NODE BIOPSY  2005  ? right  ? BREAST SURGERY  2004  ? rt br mass  ? CESAREAN SECTION    ? COLONOSCOPY    ? ESOPHAGOGASTRODUODENOSCOPY  2001  ? H pylori gastritis  ? EYE SURGERY    ? both cataracts  ? PAROTIDECTOMY Right 09/05/2013  ? Procedure: RIGHT SUPERFICIAL PAROTIDECTOMY WITH FACIAL NERVE DISECTION;  Surgeon: [name], MD;  Location: [place];  Service: ENT;  Laterality: Right;  All documentation done by R.Ward after [phone] --done under G. Garzon's password  ? RE-EXCISION OF BREAST LUMPECTOMY  2005  ? right  ? ?Social History: ?  reports that she has never smoked. Her smokeless tobacco use includes chew. She reports current alcohol use. She reports that she does not use drugs. ? ?Family History  ?Problem Relation Age of Onset  ? Heart disease Mother   ?     MI  ? Cancer Father   ?  bone  ? Thyroid disease Neg Hx   ? Hypercalcemia Neg Hx   ? Colon cancer Neg Hx   ? Stomach cancer Neg Hx   ? Rectal cancer Neg Hx   ? Esophageal cancer Neg Hx   ? ? ?Medications: ?Patient's Medications  ?New Prescriptions  ? No medications on file  ?Previous Medications  ? ACETAMINOPHEN (TYLENOL) 500 MG TABLET    Take 2 tablets (1,000 mg total) by mouth every 6 (six) hours as needed.  ? ATORVASTATIN (LIPITOR) 80 MG TABLET    Take 1 tablet (80 mg total) by mouth daily.  ? CARVEDILOL (COREG) 6.25 MG TABLET    Take 1 tablet (6.25 mg total) by mouth 2  (two) times daily with a meal.  ? HYDRALAZINE (APRESOLINE) 25 MG TABLET    Take 1 tablet (25 mg total) by mouth 3 (three) times daily.  ? LOSARTAN (COZAAR) 100 MG TABLET    Take 1 tablet (100 mg total) by mouth daily.  ? RIVAROXABAN (XARELTO) 20 MG TABS TABLET    Take 1 tablet (20 mg total) by mouth daily with supper.  ? SERTRALINE (ZOLOFT) 50 MG TABLET    Take 1.5 tablets (75 mg total) by mouth daily.  ?Modified Medications  ? No medications on file  ?Discontinued Medications  ? No medications on file  ? ? ?Physical Exam: ? ?Vitals:  ? 04/20/22 0814  ?BP: 134/78  ?Pulse: 74  ?Temp: (!) 97.1 ?F (36.2 ?C)  ?TempSrc: Temporal  ?SpO2: 96%  ?Weight: 163 lb (73.9 kg)  ?Height: '5\' 3"'$  (1.6 m)  ? ?Body mass index is 28.87 kg/m?. ?Wt Readings from Last 3 Encounters:  ?04/20/22 163 lb (73.9 kg)  ?04/03/22 159 lb (72.1 kg)  ?03/06/22 158 lb (71.7 kg)  ? ? ?Physical Exam ?Constitutional:   ?   General: She is not in acute distress. ?   Appearance: She is well-developed. She is not diaphoretic.  ?HENT:  ?   Head: Normocephalic and atraumatic.  ?   Mouth/Throat:  ?   Pharynx: No oropharyngeal exudate.  ?Eyes:  ?   Conjunctiva/sclera: Conjunctivae normal.  ?   Pupils: Pupils are equal, round, and reactive to light.  ?Cardiovascular:  ?   Rate and Rhythm: Normal rate and regular rhythm.  ?   Heart sounds: Normal heart sounds.  ?Pulmonary:  ?   Effort: Pulmonary effort is normal.  ?   Breath sounds: Normal breath sounds.  ?Abdominal:  ?   General: Bowel sounds are normal.  ?   Palpations: Abdomen is soft.  ?Musculoskeletal:  ?   Cervical back: Normal range of motion and neck supple.  ?Skin: ?   General: Skin is warm and dry.  ?Neurological:  ?   Mental Status: She is alert.  ?Psychiatric:     ?   Mood and Affect: Mood normal.  ? ? ?Labs reviewed: ?Basic Metabolic Panel: ?Recent Labs  ?  11/17/21 ?1038 03/06/22 ?1500 03/16/22 ?1052 04/03/22 ?1020  ?NA 141 141  --  141  ?K 5.2 4.0  --  4.4  ?CL 104 107  --  108  ?CO2 25 27  --  28   ?GLUCOSE 96 126  --  78  ?BUN 15 19  --  15  ?CREATININE 1.02* 0.94  --  0.84  ?CALCIUM 11.2* 11.4* 10.2 10.5*  ? ?Liver Function Tests: ?Recent Labs  ?  04/21/21 ?1137 03/06/22 ?1500  ?AST 14 13  ?ALT 9 8  ?BILITOT 0.5 0.4  ?  PROT 6.4 6.6  ? ?No results for input(s): LIPASE, AMYLASE in the last 8760 hours. ?No results for input(s): AMMONIA in the last 8760 hours. ?CBC: ?Recent Labs  ?  11/17/21 ?1038 03/06/22 ?1500  ?WBC 7.3 6.1  ?NEUTROABS  --  3,294  ?HGB 13.2 13.2  ?HCT 39.9 40.7  ?MCV 87 88.5  ?PLT 444 336  ? ?Lipid Panel: ?Recent Labs  ?  04/21/21 ?1137 03/06/22 ?1500  ?CHOL 189 210*  ?HDL 51 49*  ?LDLCALC 115* 135*  ?TRIG 122 135  ?CHOLHDL 3.7 4.3  ? ?TSH: ?No results for input(s): TSH in the last 8760 hours. ?A1C: ?Lab Results  ?Component Value Date  ? HGBA1C 5.6 05/29/2020  ? ? ? ?Assessment/Plan ?1. Essential hypertension, benign ?Will continue current regimen, refill provided for losartan. Continue medication and dietary compliance.  ?- losartan (COZAAR) 100 MG tablet; Take 1 tablet (100 mg total) by mouth daily.  Dispense: 90 tablet; Refill: 1 ?- BASIC METABOLIC PANEL WITH GFR; Future ? ?2. Hypercalcemia ?Will follow.  ?- BASIC METABOLIC PANEL WITH GFR; Future ?- PTH, Intact and Calcium; Future ? ?3. Hyperlipidemia LDL goal <70 ?-had been out of medication, she is currently taking at this time.  ?Continue lipitor with dietary modifications  ?- Lipid panel; Future ? ? ?Return in about 3 months (around 07/21/2022) for routine follow up, labs prior to appt. Marland Kitchen ?Carlos American. Dewaine Oats, AGNP ? ?[facility] ?[phone]  ?

## 2022-04-20 NOTE — Patient Instructions (Signed)
To make nurse visit to compare blood pressure cuffs.  ?

## 2022-05-05 ENCOUNTER — Telehealth (HOSPITAL_BASED_OUTPATIENT_CLINIC_OR_DEPARTMENT_OTHER): Payer: BC Managed Care – PPO | Admitting: Psychiatry

## 2022-05-05 ENCOUNTER — Encounter (HOSPITAL_COMMUNITY): Payer: Self-pay | Admitting: Psychiatry

## 2022-05-05 VITALS — Wt 163.0 lb

## 2022-05-05 DIAGNOSIS — F332 Major depressive disorder, recurrent severe without psychotic features: Secondary | ICD-10-CM | POA: Diagnosis not present

## 2022-05-05 DIAGNOSIS — F419 Anxiety disorder, unspecified: Secondary | ICD-10-CM | POA: Diagnosis not present

## 2022-05-05 MED ORDER — SERTRALINE HCL 50 MG PO TABS: 75.0000 mg | ORAL_TABLET | Freq: Every day | ORAL | 0 refills | Status: DC

## 2022-05-05 NOTE — Progress Notes (Signed)
Virtual Visit via Telephone Note  I connected with Erin Cunningham on 05/05/22 at  2:00 PM EDT by telephone and verified that I am speaking with the correct person using two identifiers.  Location: Patient: Work Provider: Biomedical scientist   I discussed the limitations, risks, security and privacy concerns of performing an evaluation and management service by telephone and the availability of in person appointments. I also discussed with the patient that there may be a patient responsible charge related to this service. The patient expressed understanding and agreed to proceed.   History of Present Illness: Patient is evaluated by phone session.  She is at work.  She admitted feeling overwhelmed because of the work and now from her husband.  She noticed her husband started to getting more irritable and calling her names.  She has plan to discuss with her husband's physician if medicine needs to be adjusted.  She sleeps okay.  She tried to keep the distance from her husband unless it is important.  She like to keep herself busy at work.  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  Her plan is to visit [place] with her grandson.  She sleeps okay.  She does not want to increase the medication.  Recently she had blood work from her primary care physician.  She has no tremor or shakes or any EPS.  She liked the Zoloft 75 mg daily.  She denies any major panic attack.  Her appetite is okay.  Her weight is stable.  Past Psychiatric History: Reviewed H/O depression and anxiety.  One inpatient at [facility] for suicidal thoughts.  H/O brief stay in OBS unit in 2016.  No h/o mania, psychosis, hallucination or suicidal attempt.  Recent Results (from the past 2160 hour(s))  Lipid panel     Status: Abnormal   Collection Time: 03/06/22  3:00 PM  Result Value Ref Range   Cholesterol 210 (H) <200 mg/dL   HDL 49 (L) > OR = 50  mg/dL   Triglycerides 135 <150 mg/dL   LDL Cholesterol (Calc) 135 (H) mg/dL (calc)    Comment: Reference range: <100 . Desirable range <100 mg/dL for primary prevention;   <70 mg/dL for patients with CHD or diabetic patients  with > or = 2 CHD risk factors. Marland Kitchen LDL-C is now calculated using the Martin-Hopkins  calculation, which is a validated novel method providing  better accuracy than the Friedewald equation in the  estimation of LDL-C.  Cresenciano Genre et al. Annamaria Helling. 5465;681(27): 2061-2068  (http://education.[facility].com/faq/FAQ164)    Total CHOL/HDL Ratio 4.3 <5.0 (calc)   Non-HDL Cholesterol (Calc) 161 (H) <130 mg/dL (calc)    Comment: For patients with diabetes plus 1 major ASCVD risk  factor, treating to a non-HDL-C goal of <100 mg/dL  (LDL-C of <70 mg/dL) is considered a therapeutic  option.   CBC with Differential/Platelet     Status: None   Collection Time: 03/06/22  3:00 PM  Result Value Ref Range   WBC 6.1 3.8 - 10.8 Thousand/uL   RBC 4.60 3.80 - 5.10 Million/uL   Hemoglobin 13.2 11.7 - 15.5 g/dL   HCT 40.7 35.0 - 45.0 %   MCV 88.5 80.0 - 100.0 fL   MCH 28.7 27.0 - 33.0 pg   MCHC 32.4 32.0 - 36.0 g/dL   RDW 12.7 11.0 - 15.0 %   Platelets 336 140 -  400 Thousand/uL   MPV 11.9 7.5 - 12.5 fL   Neutro Abs 3,294 1,500 - 7,800 cells/uL   Lymphs Abs 1,952 850 - 3,900 cells/uL   Absolute Monocytes 464 200 - 950 cells/uL   Eosinophils Absolute 268 15 - 500 cells/uL   Basophils Absolute 122 0 - 200 cells/uL   Neutrophils Relative % 54 %   Total Lymphocyte 32.0 %   Monocytes Relative 7.6 %   Eosinophils Relative 4.4 %   Basophils Relative 2.0 %  CMP with eGFR([facility])     Status: Abnormal   Collection Time: 03/06/22  3:00 PM  Result Value Ref Range   Glucose, Bld 126 65 - 139 mg/dL    Comment: .        Non-fasting reference interval .    BUN 19 7 - 25 mg/dL   Creat 0.94 0.60 - 0.95 mg/dL   eGFR 61 > OR = 60 mL/min/1.34m    Comment: The eGFR is based on the  CKD-EPI 2021 equation. To calculate  the new eGFR from a previous Creatinine or Cystatin C result, go to https://www.kidney.org/professionals/ kdoqi/gfr%5Fcalculator    BUN/Creatinine Ratio NOT APPLICABLE 6 - 22 (calc)   Sodium 141 135 - 146 mmol/L   Potassium 4.0 3.5 - 5.3 mmol/L   Chloride 107 98 - 110 mmol/L   CO2 27 20 - 32 mmol/L   Calcium 11.4 (H) 8.6 - 10.4 mg/dL   Total Protein 6.6 6.1 - 8.1 g/dL   Albumin 4.0 3.6 - 5.1 g/dL   Globulin 2.6 1.9 - 3.7 g/dL (calc)   AG Ratio 1.5 1.0 - 2.5 (calc)   Total Bilirubin 0.4 0.2 - 1.2 mg/dL   Alkaline phosphatase (APISO) 99 37 - 153 U/L   AST 13 10 - 35 U/L   ALT 8 6 - 29 U/L  PTH, Intact and Calcium     Status: Abnormal   Collection Time: 03/16/22 10:52 AM  Result Value Ref Range   PTH 89 (H) 16 - 77 pg/mL    Comment: . Interpretive Guide    Intact PTH           Calcium ------------------    ----------           ------- Normal Parathyroid    Normal               Normal Hypoparathyroidism    Low or Low Normal    Low Hyperparathyroidism    Primary            Normal or High       High    Secondary          High                 Normal or Low    Tertiary           High                 High Non-Parathyroid    Hypercalcemia      Low or Low Normal    High .    Calcium 10.2 8.6 - 10.4 mg/dL  BASIC METABOLIC PANEL WITH GFR     Status: Abnormal   Collection Time: 04/03/22 10:20 AM  Result Value Ref Range   Glucose, Bld 78 65 - 99 mg/dL    Comment: .            Fasting reference interval .    BUN 15 7 - 25 mg/dL  Creat 0.84 0.60 - 0.95 mg/dL   eGFR 70 > OR = 60 mL/min/1.78m    Comment: The eGFR is based on the CKD-EPI 2021 equation. To calculate  the new eGFR from a previous Creatinine or Cystatin C result, go to https://www.kidney.org/professionals/ kdoqi/gfr%5Fcalculator    BUN/Creatinine Ratio NOT APPLICABLE 6 - 22 (calc)   Sodium 141 135 - 146 mmol/L   Potassium 4.4 3.5 - 5.3 mmol/L   Chloride 108 98 - 110 mmol/L    CO2 28 20 - 32 mmol/L   Calcium 10.5 (H) 8.6 - 10.4 mg/dL     Psychiatric Specialty Exam: Physical Exam  Review of Systems  Weight 163 lb (73.9 kg).Body mass index is 28.87 kg/m.  General Appearance: NA  Eye Contact:  NA  Speech:  Slow  Volume:  Normal  Mood:  Anxious and Dysphoric  Affect:  NA  Thought Process:  Goal Directed  Orientation:  Full (Time, Place, and Person)  Thought Content:  Rumination  Suicidal Thoughts:  No  Homicidal Thoughts:  No  Memory:  Immediate;   Good Recent;   Good Remote;   Fair  Judgement:  Fair  Insight:  Present  Psychomotor Activity:  NA  Concentration:  Concentration: Fair and Attention Span: Good  Recall:  Good  Fund of Knowledge:  Good  Language:  Good  Akathisia:  No  Handed:  Right  AIMS (if indicated):     Assets:  Communication Skills Desire for Improvement Housing Social Support Talents/Skills  ADL's:  Intact  Cognition:  WNL  Sleep:   ok     Assessment and Plan: Major depressive disorder, recurrent.  Anxiety.  I reviewed blood work results.  Patient wants to keep the current medication however admitted that stress related to her marital life need to be addressed.  She is hoping to talk to her husband's physician to adjust the medication because she noticed he may not be taking the current medication to control his anger.  I offered counseling but patient does not want.  [place]  Discussed medication side effects and benefits.  Continue Zoloft 75 mg daily.  I recommend to call [facility] if she feels worsening of symptoms.  Follow up in 3 months.  Follow Up Instructions:    I discussed the assessment and treatment plan with the patient. The patient was provided an opportunity to ask questions and all were answered. The patient agreed with the plan and demonstrated an understanding of the instructions.   The patient was advised to call back or seek an in-person  evaluation if the symptoms worsen or if the condition fails to improve as anticipated.  I provided 20 minutes of non-face-to-face time during this encounter.   SKathlee Nations MD

## 2022-05-20 ENCOUNTER — Ambulatory Visit (HOSPITAL_BASED_OUTPATIENT_CLINIC_OR_DEPARTMENT_OTHER): Payer: BC Managed Care – PPO | Admitting: Cardiology

## 2022-06-26 ENCOUNTER — Telehealth (HOSPITAL_BASED_OUTPATIENT_CLINIC_OR_DEPARTMENT_OTHER): Payer: Self-pay | Admitting: Cardiology

## 2022-06-26 NOTE — Telephone Encounter (Signed)
FYI...   Called to confirm appointment for Tuesday 06/30/2022 for patient. Spoke to patient and she stated she was admitted to hospital last night 06/25/2022. Said they found a blood clot in lung.

## 2022-06-30 ENCOUNTER — Ambulatory Visit (HOSPITAL_BASED_OUTPATIENT_CLINIC_OR_DEPARTMENT_OTHER): Payer: BC Managed Care – PPO | Admitting: Cardiology

## 2022-07-14 ENCOUNTER — Encounter: Payer: BC Managed Care – PPO | Admitting: Family

## 2022-07-14 ENCOUNTER — Encounter: Payer: Self-pay | Admitting: Family

## 2022-07-14 ENCOUNTER — Ambulatory Visit (INDEPENDENT_AMBULATORY_CARE_PROVIDER_SITE_OTHER): Payer: BC Managed Care – PPO | Admitting: Family

## 2022-07-14 VITALS — BP 150/80 | HR 86 | Temp 97.9°F | Resp 16 | Ht 63.0 in | Wt 159.6 lb

## 2022-07-14 DIAGNOSIS — M5431 Sciatica, right side: Secondary | ICD-10-CM | POA: Diagnosis not present

## 2022-07-14 MED ORDER — PREDNISONE 20 MG PO TABS: ORAL_TABLET | ORAL | 0 refills | Status: AC

## 2022-07-14 MED ORDER — KETOROLAC TROMETHAMINE 30 MG/ML IJ SOLN
30.0000 mg | Freq: Once | INTRAMUSCULAR | Status: AC
  Administered 2022-07-14: 30 mg via INTRAVENOUS

## 2022-07-14 NOTE — Progress Notes (Signed)
Provider: Amoy Steeves FNP-C  Lauree Chandler, NP  Patient Care Team: Lauree Chandler, NP as PCP - General (Geriatric Medicine) [name], MD (Inactive) as Consulting Physician (Otolaryngology) [name], MD as Consulting Physician (Cardiology)  Extended Emergency Contact Information Primary Emergency Contact: Rideaux,[name] Address: [address]          [place], [place] [address] [name] of [place] Phone: [phone] Mobile Phone: [phone] Relation: Spouse Secondary Emergency Contact: [place] Phone: [phone] Mobile Phone: [phone] Relation: Son  Code Status:  Full Code  Goals of care: Advanced Directive information    07/14/2022   11:10 AM  Advanced Directives  Does Patient Have a Medical Advance Directive? No  Would patient like information on creating a medical advance directive? No - Patient declined     Chief Complaint  Patient presents with   Acute Visit    Patient complains of right hip pain that's been going on for 2 weeks. Patient states pain comes when pressure is applied to the hip.    HPI:  Pt is a 81 y.o. female seen today for an acute visit for evaluation of right hip pain x 2 weeks.pain worst with trying to raise up from seated position or straightening back.pain radiates down to the leg.No numbness,tingling or weakness on the leg.     Past Medical History:  Diagnosis Date   Breast CA ([facility]) 2006   right ([facility])   Cancer ([facility])    Helicobacter pylori gastritis 2001   High blood pressure    History of adenomatous polyp of colon 09/23/2017   Oma 09/25/2017 and apparently polyps in the past as well though recalled age   History of shingles    Major depressive disorder 05/28/2020   Major depressive disorder, recurrent, severe without psychotic features ([facility]) 06/24/2015   Parotid mass 2014   right benign cyst   Personal history of radiation therapy 2006   Rt breast    Stroke (cerebrum) ([facility]) 06/28/2020   Per patient   Wears glasses    Past Surgical History:  Procedure Laterality Date   ABDOMINAL HYSTERECTOMY  1982   BSO   BREAST LUMPECTOMY Right 2007   BREAST LUMPECTOMY WITH SENTINEL LYMPH NODE BIOPSY  2005   right   BREAST SURGERY  2004   rt br mass   CESAREAN SECTION     COLONOSCOPY     ESOPHAGOGASTRODUODENOSCOPY  2001   H pylori gastritis   EYE SURGERY     both cataracts   PAROTIDECTOMY Right 09/05/2013   Procedure: RIGHT SUPERFICIAL PAROTIDECTOMY WITH FACIAL NERVE DISECTION;  Surgeon: [name], MD;  Location: [facility];  Service: ENT;  Laterality: Right;  All documentation done by R.Ward after [phone] --done under G. [facility]'s password   RE-EXCISION OF BREAST LUMPECTOMY  2005   right    No Known Allergies  Outpatient Encounter Medications as of 07/14/2022  Medication Sig   acetaminophen (TYLENOL) 500 MG tablet Take 2 tablets (1,000 mg total) by mouth every 6 (six) hours as needed.   atorvastatin (LIPITOR) 80 MG tablet Take 1 tablet (80 mg total) by mouth daily.   carvedilol (COREG) 6.25 MG tablet Take 1 tablet (6.25 mg total) by mouth 2 (two) times daily with a meal.   hydrALAZINE (APRESOLINE) 25 MG tablet Take 1 tablet (25 mg total) by mouth 3 (three) times daily.   losartan (COZAAR) 100 MG tablet Take 1 tablet (100 mg total) by mouth daily.  rivaroxaban (XARELTO) 20 MG TABS tablet Take 1 tablet (20 mg total) by mouth daily with supper.   sertraline (ZOLOFT) 50 MG tablet Take 1.5 tablets (75 mg total) by mouth daily.   No facility-administered encounter medications on file as of 07/14/2022.    Review of Systems  Constitutional:  Negative for chills, fatigue and fever.  Musculoskeletal:  Positive for arthralgias. Negative for back pain, gait problem, joint swelling, myalgias, neck pain and neck stiffness.       Right hip pain radiating down to leg   Neurological:  Negative for dizziness, speech difficulty,  weakness, light-headedness, numbness and headaches.    Immunization History  Administered Date(s) Administered   Fluad Quad(high Dose 65+) 01/15/2020, 12/20/2020   Influenza, High Dose Seasonal PF 09/14/2018   Influenza,inj,Quad PF,6+ Mos 11/06/2015   PFIZER(Purple Top)SARS-COV-2 Vaccination 02/03/2020, 02/24/2020   PPD Test 12/10/2015   Pneumococcal Conjugate-13 01/16/2015   Pneumococcal Polysaccharide-23 05/12/2017   Tdap 06/15/2018, 01/12/2022   Zoster Recombinat (Shingrix) 03/13/2022   Zoster, Unspecified 12/05/2014   Pertinent  Health Maintenance Due  Topic Date Due   INFLUENZA VACCINE  07/07/2022   DEXA SCAN  Completed      11/13/2021    9:44 AM 01/12/2022    4:55 PM 01/15/2022   10:43 AM 03/06/2022    2:12 PM 07/14/2022   11:10 AM  Fall Risk  Falls in the past year? 0  1 0 0  Was there an injury with Fall? 0  1 0 0  Fall Risk Category Calculator 0  2 0 0  Fall Risk Category Low  Moderate Low Low  Patient Fall Risk Level Low fall risk Low fall risk Moderate fall risk Low fall risk Low fall risk  Patient at Risk for Falls Due to No Fall Risks  History of fall(s) No Fall Risks No Fall Risks  Fall risk Follow up Falls evaluation completed;Education provided;Falls prevention discussed  Falls evaluation completed;Education provided;Falls prevention discussed Falls evaluation completed Falls evaluation completed   Functional Status Survey:    Vitals:   07/14/22 1108  BP: (!) 160/100  Pulse: 86  Resp: 16  Temp: 97.9 F (36.6 C)  SpO2: 96%  Weight: 159 lb 9.6 oz (72.4 kg)  Height: '5\' 3"'$  (1.6 m)   Body mass index is 28.27 kg/m. Physical Exam Vitals reviewed.  Constitutional:      General: She is not in acute distress.    Appearance: Normal appearance. She is normal weight. She is not ill-appearing or diaphoretic.  Eyes:     General: No scleral icterus.       Right eye: No discharge.        Left eye: No discharge.     Conjunctiva/sclera: Conjunctivae normal.      Pupils: Pupils are equal, round, and reactive to light.  Neck:     Vascular: No carotid bruit.  Cardiovascular:     Rate and Rhythm: Normal rate and regular rhythm.     Pulses: Normal pulses.     Heart sounds: Normal heart sounds. No murmur heard.    No friction rub. No gallop.  Pulmonary:     Effort: Pulmonary effort is normal. No respiratory distress.     Breath sounds: Normal breath sounds. No wheezing, rhonchi or rales.  Chest:     Chest wall: No tenderness.  Abdominal:     General: Bowel sounds are normal. There is no distension.     Palpations: Abdomen is soft. There is no mass.  Tenderness: There is no abdominal tenderness. There is no right CVA tenderness, left CVA tenderness, guarding or rebound.  Musculoskeletal:        General: No swelling. Normal range of motion.     Cervical back: Normal range of motion. No rigidity or tenderness.     Right hip: Tenderness present. No deformity or crepitus. Normal range of motion. Normal strength.     Left hip: Normal.     Right lower leg: No edema.     Left lower leg: No edema.  Lymphadenopathy:     Cervical: No cervical adenopathy.  Skin:    General: Skin is warm and dry.     Coloration: Skin is not pale.     Findings: No erythema or rash.  Neurological:     Mental Status: She is alert and oriented to person, place, and time.     Sensory: No sensory deficit.     Motor: No weakness.     Coordination: Coordination normal.     Gait: Gait normal.  Psychiatric:        Mood and Affect: Mood normal.        Speech: Speech normal.        Behavior: Behavior normal.     Labs reviewed: Recent Labs    11/17/21 1038 03/06/22 1500 03/16/22 1052 04/03/22 1020  NA 141 141  --  141  K 5.2 4.0  --  4.4  CL 104 107  --  108  CO2 25 27  --  28  GLUCOSE 96 126  --  78  BUN 15 19  --  15  CREATININE 1.02* 0.94  --  0.84  CALCIUM 11.2* 11.4* 10.2 10.5*   Recent Labs    03/06/22 1500  AST 13  ALT 8  BILITOT 0.4  PROT 6.6    Recent Labs    11/17/21 1038 03/06/22 1500  WBC 7.3 6.1  NEUTROABS  --  3,294  HGB 13.2 13.2  HCT 39.9 40.7  MCV 87 88.5  PLT 444 336   Lab Results  Component Value Date   TSH 1.09 06/13/2018   Lab Results  Component Value Date   HGBA1C 5.6 05/29/2020   Lab Results  Component Value Date   CHOL 210 (H) 03/06/2022   HDL 49 (L) 03/06/2022   LDLCALC 135 (H) 03/06/2022   TRIG 135 03/06/2022   CHOLHDL 4.3 03/06/2022    Significant Diagnostic Results in last 30 days:  No results found.  Assessment/Plan  Right sciatic nerve pain In pain during visit.Tordal I.M given by Eleanora Neighbor with relief.  - right hip tender on palpation. - will start on tapered prednisone as below.side effects discussed.  - ketorolac (TORADOL) 30 MG/ML injection 30 mg - predniSONE (DELTASONE) 20 MG tablet; Take 2 tablets (40 mg total) by mouth daily with breakfast for 1 day, THEN 1.5 tablets (30 mg total) daily with breakfast for 1 day, THEN 1 tablet (20 mg total) daily with breakfast for 1 day, THEN 0.5 tablets (10 mg total) daily with breakfast for 1 day.  Dispense: 5 tablet; Refill: 0  Family/ staff Communication: Reviewed plan of care with patient verbalized understanding  Labs/tests ordered: None   Next Appointment: Return if symptoms worsen or fail to improve.   Sandrea Hughs, NP

## 2022-07-15 ENCOUNTER — Telehealth: Payer: Self-pay

## 2022-07-15 NOTE — Telephone Encounter (Signed)
Patient called sating she was given a sample yesterday that she misplaced, patient called earlier to inquire about this and someone was rude to her, and she wants to get another sample of whatever Webb Silversmith was trying to give her.  I placed patient on hold, spoke with Marlowe Sax, NP and Webb Silversmith states she did not give patient a sample yesterday. I resumed called with patient and relayed Dinah's reply. Patient states she has a lot going on and somehow got confused about this.

## 2022-07-16 NOTE — Progress Notes (Signed)
This encounter was created in error - please disregard. duplicate

## 2022-07-23 ENCOUNTER — Telehealth: Payer: Self-pay

## 2022-07-23 DIAGNOSIS — M25551 Pain in right hip: Secondary | ICD-10-CM

## 2022-07-23 NOTE — Telephone Encounter (Signed)
Recommend urgent referral to Orthopedic for further evaluation. Take Extra strength Tylenol 1000 mg Tablet one by mouth twice daily.Not two and 1/2 tablets.

## 2022-07-23 NOTE — Telephone Encounter (Signed)
Patient states that she has taken all of the prednisone and she is still having the pain in her hip. She states that she has been taking tylenol extra strength 2 1/2 tablets and still not helping. She can't put foot down.  Message routed to Marlowe Sax, NP

## 2022-07-24 NOTE — Telephone Encounter (Signed)
Orthopedic referral ordered.Specialist office will call you for appointment.

## 2022-07-24 NOTE — Telephone Encounter (Signed)
Spoke with patient and she verbalized her understanding and agreed. Patient would like referral for orthopedic sent in.  Message routed to Marlowe Sax, NP

## 2022-07-24 NOTE — Telephone Encounter (Signed)
Called and left message informing patient that referral has been placed

## 2022-08-04 ENCOUNTER — Telehealth (HOSPITAL_BASED_OUTPATIENT_CLINIC_OR_DEPARTMENT_OTHER): Payer: BC Managed Care – PPO | Admitting: Psychiatry

## 2022-08-04 ENCOUNTER — Encounter (HOSPITAL_COMMUNITY): Payer: Self-pay | Admitting: Psychiatry

## 2022-08-04 VITALS — Wt 159.0 lb

## 2022-08-04 DIAGNOSIS — F332 Major depressive disorder, recurrent severe without psychotic features: Secondary | ICD-10-CM | POA: Diagnosis not present

## 2022-08-04 DIAGNOSIS — F419 Anxiety disorder, unspecified: Secondary | ICD-10-CM | POA: Diagnosis not present

## 2022-08-04 MED ORDER — SERTRALINE HCL 100 MG PO TABS: 100.0000 mg | ORAL_TABLET | Freq: Every day | ORAL | 0 refills | Status: DC

## 2022-08-04 MED ORDER — TRAZODONE HCL 50 MG PO TABS: 50.0000 mg | ORAL_TABLET | Freq: Every evening | ORAL | 0 refills | Status: DC | PRN

## 2022-08-04 NOTE — Progress Notes (Signed)
Virtual Visit via Telephone Note  I connected with Erin Cunningham on 08/04/22 at  2:20 PM EDT by telephone and verified that I am speaking with the correct person using two identifiers.  Location: Patient: Home Provider: Home Office   I discussed the limitations, risks, security and privacy concerns of performing an evaluation and management service by telephone and the availability of in person appointments. I also discussed with the patient that there may be a patient responsible charge related to this service. The patient expressed understanding and agreed to proceed.   History of Present Illness: Patient is evaluated by phone session.  She under a lot of stress because husband diagnosed with a stage IV lung cancer.  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  She has some help from her 42 year old grandson.  Patient reported crying spells, stress, anxiety, poor sleep and difficult to see her husband who is deteriorating gradually.  Patient told husband is receiving chemotherapy and not doing very well.  She is taking a leave of absence from her work.  She admitted using left about trazodone which was 80 years old for sleep.  Patient has good support from her neighbors.  She is not sure what is a life expectancy of her husband but she is hopeful about her husband.  Her appetite is okay.  Her energy level is fair.  She denies any hallucination, suicidal thoughts, feeling of hopelessness.  She has no tremor or shakes or any EPS.  She is not interested in therapy but like to have an option in the future if needed.   Past Psychiatric History: Reviewed H/O depression and anxiety.  One inpatient at [facility] for suicidal thoughts.  H/O brief stay in OBS unit in 2016.  No h/o mania, psychosis, hallucination or suicidal attempt.  Psychiatric Specialty Exam: Physical Exam  Review of Systems  Weight 159 lb (72.1 kg).There is no height or weight on file to calculate BMI.  General Appearance: NA  Eye  Contact:  NA  Speech:  Slow  Volume:  Decreased  Mood:  Anxious and Dysphoric  Affect:  NA  Thought Process:  Goal Directed  Orientation:  Full (Time, Place, and Person)  Thought Content:  Rumination  Suicidal Thoughts:  No  Homicidal Thoughts:  No  Memory:  Immediate;   Good Recent;   Good Remote;   Good  Judgement:  Intact  Insight:  Present  Psychomotor Activity:  NA  Concentration:  Concentration: Fair and Attention Span: Fair  Recall:  Good  Fund of Knowledge:  Good  Language:  Good  Akathisia:  No  Handed:  Right  AIMS (if indicated):     Assets:  Communication Skills Desire for Improvement Housing Resilience Transportation  ADL's:  Intact  Cognition:  WNL  Sleep:   not good      Assessment and Plan: Major depressive disorder, recurrent.  Anxiety.  Patient having a lot of anxiety and depression as has been diagnosed with stage IV lung cancer and not doing very well.  Husband is currently getting chemotherapy.  Recommend not to use leftover old prescription of trazodone.  We will provide a new prescription for trazodone 50 mg to take as needed for insomnia.  We discussed trying to increase dose of Zoloft as patient has having a lot of anxiety and crying spells.  She agreed to give a try.  I discussed medication side effects and benefits.  We will try Zoloft 100 mg daily.  Recommended to call  us back if she has any question or any concern.  I offer therapy but patient at this time does not feel she needed but like to have an option in the future if needed.  Follow-up in 3 months.  Follow Up Instructions:    I discussed the assessment and treatment plan with the patient. The patient was provided an opportunity to ask questions and all were answered. The patient agreed with the plan and demonstrated an understanding of the instructions.   The patient was advised to call back or seek an in-person evaluation if the symptoms worsen or if the condition fails to improve as  anticipated.  Collaboration of Care: Other provider involved in patient's care AEB notes are available in epic to review.   Patient/Guardian was advised Release of Information must be obtained prior to any record release in order to collaborate their care with an outside provider. Patient/Guardian was advised if they have not already done so to contact the registration department to sign all necessary forms in order for [place] to release information regarding their care.   Consent: Patient/Guardian gives verbal consent for treatment and assignment of benefits for services provided during this visit. Patient/Guardian expressed understanding and agreed to proceed.    I provided 19 minutes of non-face-to-face time during this encounter.   [name], MD

## 2022-08-05 ENCOUNTER — Ambulatory Visit (INDEPENDENT_AMBULATORY_CARE_PROVIDER_SITE_OTHER): Payer: BC Managed Care – PPO

## 2022-08-05 ENCOUNTER — Ambulatory Visit (INDEPENDENT_AMBULATORY_CARE_PROVIDER_SITE_OTHER): Payer: BC Managed Care – PPO | Admitting: Orthopaedic Surgery

## 2022-08-05 DIAGNOSIS — M7061 Trochanteric bursitis, right hip: Secondary | ICD-10-CM

## 2022-08-05 MED ORDER — METHYLPREDNISOLONE ACETATE 40 MG/ML IJ SUSP
40.0000 mg | INTRAMUSCULAR | Status: AC | PRN
  Administered 2022-08-05: 40 mg via INTRA_ARTICULAR

## 2022-08-05 MED ORDER — BUPIVACAINE HCL 0.25 % IJ SOLN
2.0000 mL | INTRAMUSCULAR | Status: AC | PRN
  Administered 2022-08-05: 2 mL via INTRA_ARTICULAR

## 2022-08-05 MED ORDER — LIDOCAINE HCL 1 % IJ SOLN
3.0000 mL | INTRAMUSCULAR | Status: AC | PRN
  Administered 2022-08-05: 3 mL

## 2022-08-05 NOTE — Progress Notes (Signed)
Office Visit Note   Patient: Erin Cunningham           Date of Birth: 1941/07/03           MRN: [id number] Visit Date: 08/05/2022              Requested by: Sandrea Hughs, NP [address] [place],  [place] [address] PCP: Lauree Chandler, NP   Assessment & Plan: Visit Diagnoses:  1. Trochanteric bursitis of right hip     Plan: Impression is right trochanteric hip pain with underlying spinal stenosis and right hip osteoarthritis.  I believe the majority the patient's symptoms today are actually coming from the trochanteric region so we have discussed proceeding with trochanteric bursa injection as well as IT band exercise program.  If her symptoms do not improve from the injection she will let us know we will look further into her back.  She will call with concerns or questions in the meantime.  Follow-Up Instructions: Return if symptoms worsen or fail to improve.   Orders:  Orders Placed This Encounter  Procedures   Large Joint Inj: R greater trochanter   XR HIP UNILAT W OR W/O PELVIS 2-3 VIEWS RIGHT   No orders of the defined types were placed in this encounter.     Procedures: Large Joint Inj: R greater trochanter on 08/05/2022 10:40 AM Indications: pain Details: 22 G needle, lateral approach Medications: 3 mL lidocaine 1 %; 2 mL bupivacaine 0.25 %; 40 mg methylPREDNISolone acetate 40 MG/ML      Clinical Data: No additional findings.   Subjective: Chief Complaint  Patient presents with   Right Hip - Pain    HPI patient is a very pleasant 81 year old female who comes in today with right lateral hip pain for the past few months.  The pain she has is primarily to the lateral hip but does note radiation down her entire leg and into her foot at times.  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  Her symptoms appear to be worse with walking as well as sleeping on her  right side.  She has been taking Tylenol without significant relief.  She denies any paresthesias.  She does have an underlying history of severe spinal stenosis of the lumbar spine in addition to disc bulging L3-4 and L5-S1.  She has previously undergone epidural steroid injection by Dr. Ernestina Patches which did not provide relief.  Review of Systems as detailed in HPI.  All others reviewed and are negative.   Objective: Vital Signs: There were no vitals taken for this visit.  Physical Exam well-developed well-nourished female no acute distress.  Alert and oriented x3.  Ortho Exam right hip exam reveals painless logroll although she does have limited internal rotation.  Negative FADIR.  Marked tenderness to the greater trochanter.  No pain with hip abduction or abduction.  Negative straight leg raise.  She does have increased pain with lumbar extension and mild tenderness to the entire paraspinous musculature of the lower lumbar spine.  No focal weakness.  She is neurovascular tact distally.  Specialty Comments:  No specialty comments available.  Imaging: XR HIP UNILAT W OR W/O PELVIS 2-3 VIEWS RIGHT  Result Date: 08/05/2022 X-rays demonstrate moderate degenerative changes to the right hip    PMFS History: Patient Active Problem List   Diagnosis Date Noted  Aortic atherosclerosis ([facility]) 03/06/2022   Hyperlipidemia LDL goal <70 03/05/2022   LVH (left ventricular hypertrophy) 11/17/2021   History of CVA (cerebrovascular accident) 11/17/2021   Paroxysmal atrial fibrillation ([facility]) 11/17/2021   Infarction of left basal ganglia ([facility]) 05/29/2020   Hypertrophic cardiomyopathy ([facility]) 05/29/2020   Anxiety and depression 05/29/2020   Weakness of right lower extremity 05/28/2020   Allergic rhinitis 06/16/2018   Chewing tobacco nicotine dependence with nicotine-induced disorder 06/16/2018   History of adenomatous polyp of colon 09/23/2017   Hypercalcemia 04/20/2016   Neck nodule 04/20/2016    Essential hypertension 07/21/2015   Abnormal fasting glucose 07/21/2015   History of breast cancer in female 07/21/2015   Major depressive disorder, recurrent, severe without psychotic features ([facility]) 06/24/2015   MDD (major depressive disorder), recurrent episode, severe ([facility]) 06/24/2015   Onychomycosis 06/12/2014   Pain in lower limb 06/12/2014   Past Medical History:  Diagnosis Date   Breast CA ([facility]) 2006   right ([place])   Cancer ([facility])    Helicobacter pylori gastritis 2001   High blood pressure    History of adenomatous polyp of colon 09/23/2017   Oma 09/25/2017 and apparently polyps in the past as well though recalled age   History of shingles    Major depressive disorder 05/28/2020   Major depressive disorder, recurrent, severe without psychotic features ([place]) 06/24/2015   Parotid mass 2014   right benign cyst   Personal history of radiation therapy 2006   Rt breast   Stroke (cerebrum) ([place]) 06/28/2020   Per patient   Wears glasses     Family History  Problem Relation Age of Onset   Heart disease Mother        MI   Cancer Father        bone   Thyroid disease Neg Hx    Hypercalcemia Neg Hx    Colon cancer Neg Hx    Stomach cancer Neg Hx    Rectal cancer Neg Hx    Esophageal cancer Neg Hx     Past Surgical History:  Procedure Laterality Date   ABDOMINAL HYSTERECTOMY  1982   BSO   BREAST LUMPECTOMY Right 2007   BREAST LUMPECTOMY WITH SENTINEL LYMPH NODE BIOPSY  2005   right   BREAST SURGERY  2004   rt br mass   CESAREAN SECTION     COLONOSCOPY     ESOPHAGOGASTRODUODENOSCOPY  2001   H pylori gastritis   EYE SURGERY     both cataracts   PAROTIDECTOMY Right 09/05/2013   Procedure: RIGHT SUPERFICIAL PAROTIDECTOMY WITH FACIAL NERVE DISECTION;  Surgeon: [name], MD;  Location: [facility];  Service: ENT;  Laterality: Right;  All documentation done by R.Ward after [phone] --done under G. [facility]'s password   RE-EXCISION OF BREAST LUMPECTOMY   2005   right   Social History   Occupational History   Occupation: Middle school Scientist, research (physical sciences): [employer]  Tobacco Use   Smoking status: Never   Smokeless tobacco: Current    Types: Chew   Tobacco comments:    Uses chew as needed   Vaping Use   Vaping Use: Never used  Substance and Sexual Activity   Alcohol use: Yes    Comment: Social maybe 2 x yearly   Drug use: No   Sexual activity: Not Currently

## 2022-09-07 ENCOUNTER — Ambulatory Visit: Payer: Medicare Other | Admitting: Nurse Practitioner

## 2022-09-09 ENCOUNTER — Encounter: Payer: Self-pay | Admitting: Nurse Practitioner

## 2022-09-11 ENCOUNTER — Encounter: Payer: Medicare Other | Admitting: Nurse Practitioner

## 2022-09-11 DIAGNOSIS — Z23 Encounter for immunization: Secondary | ICD-10-CM

## 2022-09-14 NOTE — Progress Notes (Signed)
This encounter was created in error - please disregard.

## 2022-09-25 ENCOUNTER — Encounter: Payer: Self-pay | Admitting: Nurse Practitioner

## 2022-09-25 NOTE — Patient Instructions (Signed)
If you plan to get shingrix (shingles vaccine) or additional covid boosters, you can schedule an appointment at your local pharmacy.   Make sure you are taking all medications as prescribe.   To take blood pressure reading at home- 1 hour after you have taken medication If staying over 140/90 to call office we will need to make medication adjustments

## 2022-09-28 ENCOUNTER — Encounter: Payer: Self-pay | Admitting: Nurse Practitioner

## 2022-09-28 ENCOUNTER — Ambulatory Visit (INDEPENDENT_AMBULATORY_CARE_PROVIDER_SITE_OTHER): Payer: BC Managed Care – PPO | Admitting: Nurse Practitioner

## 2022-09-28 VITALS — BP 156/90 | HR 100 | Temp 97.8°F | Ht 63.0 in | Wt 159.0 lb

## 2022-09-28 DIAGNOSIS — E785 Hyperlipidemia, unspecified: Secondary | ICD-10-CM | POA: Diagnosis not present

## 2022-09-28 DIAGNOSIS — I10 Essential (primary) hypertension: Secondary | ICD-10-CM | POA: Diagnosis not present

## 2022-09-28 DIAGNOSIS — Z8673 Personal history of transient ischemic attack (TIA), and cerebral infarction without residual deficits: Secondary | ICD-10-CM | POA: Diagnosis not present

## 2022-09-28 DIAGNOSIS — Z23 Encounter for immunization: Secondary | ICD-10-CM

## 2022-09-28 DIAGNOSIS — I480 Paroxysmal atrial fibrillation: Secondary | ICD-10-CM | POA: Diagnosis not present

## 2022-09-28 DIAGNOSIS — Z636 Dependent relative needing care at home: Secondary | ICD-10-CM

## 2022-09-28 NOTE — Progress Notes (Signed)
Careteam: Patient Care Team: Lauree Chandler, NP as PCP - General (Geriatric Medicine) [name], MD (Inactive) as Consulting Physician (Otolaryngology) [name], MD as Consulting Physician (Cardiology)  PLACE OF SERVICE:  [place] Directive information Does Patient Have a Medical Advance Directive?: No, Would patient like information on creating a medical advance directive?: Yes (MAU/Ambulatory/Procedural Areas - Information given)  No Known Allergies  Chief Complaint  Patient presents with   Medical Management of Chronic Issues    6 month follow-up. Discuss need for additional covid boosters and shingrix. Flu vaccine today. No ACP in Epic.     HPI: Patient is a 81 y.o. female for follow up.   She has not taken her blood pressure medication today. She does not check her blood pressure at home.   Her husband is under hospice care and there is a lot going on with him. He had to go to hospital recently. She is his primary caretaker. He is noncompliant with recommendations and out of it a lot of the time. She has a good support system. Last week she had a breakdown at work.   Insomnia-does not take medication because she is worried her husband will need her.   A fib- generally rate controlled but went into a fib last week. She had not taken her medications that day.   Review of Systems:  Review of Systems  Constitutional:  Negative for chills, fever and weight loss.  HENT:  Negative for tinnitus.   Respiratory:  Negative for cough, sputum production and shortness of breath.   Cardiovascular:  Negative for chest pain, palpitations and leg swelling.  Gastrointestinal:  Negative for abdominal pain, constipation, diarrhea and heartburn.  Genitourinary:  Negative for dysuria, frequency and urgency.  Musculoskeletal:  Negative for back pain, falls, joint pain and myalgias.  Skin: Negative.   Neurological:  Negative for dizziness and  headaches.  Psychiatric/Behavioral:  Negative for depression and memory loss. The patient has insomnia.        Increase stress at home    Past Medical History:  Diagnosis Date   Breast CA ([place]) 2006   right ([place])   Cancer ([place])    Helicobacter pylori gastritis 2001   High blood pressure    History of adenomatous polyp of colon 09/23/2017   [place] 09/25/2017 and apparently polyps in the past as well though recalled age   History of shingles    Major depressive disorder 05/28/2020   Major depressive disorder, recurrent, severe without psychotic features ([place]) 06/24/2015   Parotid mass 2014   right benign cyst   Personal history of radiation therapy 2006   Rt breast   Stroke (cerebrum) ([place]) 06/28/2020   Per patient   Wears glasses    Past Surgical History:  Procedure Laterality Date   ABDOMINAL HYSTERECTOMY  1982   BSO   BREAST LUMPECTOMY Right 2007   BREAST LUMPECTOMY WITH SENTINEL LYMPH NODE BIOPSY  2005   right   BREAST SURGERY  2004   rt br mass   CESAREAN SECTION     COLONOSCOPY     ESOPHAGOGASTRODUODENOSCOPY  2001   H pylori gastritis   EYE SURGERY     both cataracts   PAROTIDECTOMY Right 09/05/2013   Procedure: RIGHT SUPERFICIAL PAROTIDECTOMY WITH FACIAL NERVE DISECTION;  Surgeon: [name], MD;  Location: [facility];  Service: ENT;  Laterality: Right;  All documentation done by R.Ward after [phone] --done under G. Garzon's password  RE-EXCISION OF BREAST LUMPECTOMY  2005   right   Social History:   reports that she has never smoked. Her smokeless tobacco use includes chew. She reports current alcohol use. She reports that she does not use drugs.  Family History  Problem Relation Age of Onset   Heart disease Mother        MI   Cancer Father        bone   Thyroid disease Neg Hx    Hypercalcemia Neg Hx    Colon cancer Neg Hx    Stomach cancer Neg Hx    Rectal cancer Neg Hx    Esophageal cancer Neg Hx     Medications: Patient's  Medications  New Prescriptions   No medications on file  Previous Medications   ACETAMINOPHEN (TYLENOL) 500 MG TABLET    Take 2 tablets (1,000 mg total) by mouth every 6 (six) hours as needed.   ATORVASTATIN (LIPITOR) 80 MG TABLET    Take 1 tablet (80 mg total) by mouth daily.   CARVEDILOL (COREG) 6.25 MG TABLET    Take 1 tablet (6.25 mg total) by mouth 2 (two) times daily with a meal.   HYDRALAZINE (APRESOLINE) 25 MG TABLET    Take 1 tablet (25 mg total) by mouth 3 (three) times daily.   LOSARTAN (COZAAR) 100 MG TABLET    Take 1 tablet (100 mg total) by mouth daily.   RIVAROXABAN (XARELTO) 20 MG TABS TABLET    Take 1 tablet (20 mg total) by mouth daily with supper.   SERTRALINE (ZOLOFT) 100 MG TABLET    Take 1 tablet (100 mg total) by mouth daily.   TRAZODONE (DESYREL) 50 MG TABLET    Take 1 tablet (50 mg total) by mouth at bedtime as needed for sleep.  Modified Medications   No medications on file  Discontinued Medications   No medications on file    Physical Exam:  Vitals:   09/25/22 1610  BP: (!) 156/90  Pulse: 100  Temp: 97.8 F (36.6 C)  TempSrc: Temporal  SpO2: 97%  Weight: 159 lb (72.1 kg)  Height: '5\' 3"'$  (1.6 m)   Body mass index is 28.17 kg/m. Wt Readings from Last 3 Encounters:  09/25/22 159 lb (72.1 kg)  07/14/22 159 lb 9.6 oz (72.4 kg)  04/20/22 163 lb (73.9 kg)    Physical Exam Constitutional:      General: She is not in acute distress.    Appearance: She is well-developed. She is not diaphoretic.  HENT:     Head: Normocephalic and atraumatic.     Mouth/Throat:     Pharynx: No oropharyngeal exudate.  Eyes:     Conjunctiva/sclera: Conjunctivae normal.     Pupils: Pupils are equal, round, and reactive to light.  Cardiovascular:     Rate and Rhythm: Normal rate and regular rhythm.     Heart sounds: Normal heart sounds.  Pulmonary:     Effort: Pulmonary effort is normal.     Breath sounds: Normal breath sounds.  Abdominal:     General: Bowel sounds  are normal.     Palpations: Abdomen is soft.  Musculoskeletal:     Cervical back: Normal range of motion and neck supple.     Right lower leg: No edema.     Left lower leg: No edema.  Skin:    General: Skin is warm and dry.  Neurological:     Mental Status: She is alert and oriented to person, place, and  time.  Psychiatric:        Mood and Affect: Mood normal.    Labs reviewed: Basic Metabolic Panel: Recent Labs    11/17/21 1038 03/06/22 1500 03/16/22 1052 04/03/22 1020  NA 141 141  --  141  K 5.2 4.0  --  4.4  CL 104 107  --  108  CO2 25 27  --  28  GLUCOSE 96 126  --  78  BUN 15 19  --  15  CREATININE 1.02* 0.94  --  0.84  CALCIUM 11.2* 11.4* 10.2 10.5*   Liver Function Tests: Recent Labs    03/06/22 1500  AST 13  ALT 8  BILITOT 0.4  PROT 6.6   No results for input(s): "LIPASE", "AMYLASE" in the last 8760 hours. No results for input(s): "AMMONIA" in the last 8760 hours. CBC: Recent Labs    11/17/21 1038 03/06/22 1500  WBC 7.3 6.1  NEUTROABS  --  3,294  HGB 13.2 13.2  HCT 39.9 40.7  MCV 87 88.5  PLT 444 336   Lipid Panel: Recent Labs    03/06/22 1500  CHOL 210*  HDL 49*  LDLCALC 135*  TRIG 135  CHOLHDL 4.3   TSH: No results for input(s): "TSH" in the last 8760 hours. A1C: Lab Results  Component Value Date   HGBA1C 5.6 05/29/2020     Assessment/Plan 1. Essential hypertension, benign -Blood pressure elevated today but typically well controlled -home blood pressures are well controlled -No changes to medications today  -will have pt continue to monitor home bp goal <140/90, to notify if readings remain high on 3 different days  -follow metabolic panel -discussed importance of medication compliance.  - COMPLETE METABOLIC PANEL WITH GFR - CBC with Differential/Platelet  2. Hyperlipidemia LDL goal <70 -continues on lipitor with dietary modifications - Lipid panel - COMPLETE METABOLIC PANEL WITH GFR  3. Paroxysmal atrial fibrillation  (HCC) -rate controlled today, discussed importance of compliance with medication to keep HR controlled with coreg BID.   4. History of CVA (cerebrovascular accident) -continues on xarelto and statin. Discussed proper control of bp as well as LDL <70 for stroke reduction   5. Need for influenza vaccination - Flu Vaccine QUAD High Dose(Fluad)  6. Caregiver stress -ongoing due to her husband with stage 4 cancer under hospice care. She plans to seek counseling through hospice   Return in about 6 months (around 03/30/2023) for routine follow up . Carlos American. [place], [facility] [phone]

## 2022-09-29 LAB — COMPLETE METABOLIC PANEL WITH GFR
AG Ratio: 1.4 (calc) (ref 1.0–2.5)
ALT: 6 U/L (ref 6–29)
AST: 13 U/L (ref 10–35)
Albumin: 3.8 g/dL (ref 3.6–5.1)
Alkaline phosphatase (APISO): 105 U/L (ref 37–153)
BUN: 15 mg/dL (ref 7–25)
CO2: 24 mmol/L (ref 20–32)
Calcium: 10.6 mg/dL — ABNORMAL HIGH (ref 8.6–10.4)
Chloride: 109 mmol/L (ref 98–110)
Creat: 0.92 mg/dL (ref 0.60–0.95)
Globulin: 2.7 g/dL (calc) (ref 1.9–3.7)
Glucose, Bld: 93 mg/dL (ref 65–99)
Potassium: 4.4 mmol/L (ref 3.5–5.3)
Sodium: 140 mmol/L (ref 135–146)
Total Bilirubin: 0.7 mg/dL (ref 0.2–1.2)
Total Protein: 6.5 g/dL (ref 6.1–8.1)
eGFR: 63 mL/min/{1.73_m2} (ref >=60)

## 2022-09-29 LAB — CBC WITH DIFFERENTIAL/PLATELET
Absolute Monocytes: 470 cells/uL (ref 200–950)
Basophils Absolute: 119 cells/uL (ref 0–200)
Basophils Relative: 2.2 %
Eosinophils Absolute: 189 cells/uL (ref 15–500)
Eosinophils Relative: 3.5 %
HCT: 38.8 % (ref 35.0–45.0)
Hemoglobin: 12.8 g/dL (ref 11.7–15.5)
Lymphs Abs: 1636 cells/uL (ref 850–3900)
MCH: 29.5 pg (ref 27.0–33.0)
MCHC: 33 g/dL (ref 32.0–36.0)
MCV: 89.4 fL (ref 80.0–100.0)
MPV: 11.7 fL (ref 7.5–12.5)
Monocytes Relative: 8.7 %
Neutro Abs: 2986 cells/uL (ref 1500–7800)
Neutrophils Relative %: 55.3 %
Platelets: 330 10*3/uL (ref 140–400)
RBC: 4.34 10*6/uL (ref 3.80–5.10)
RDW: 12.7 % (ref 11.0–15.0)
Total Lymphocyte: 30.3 %
WBC: 5.4 10*3/uL (ref 3.8–10.8)

## 2022-09-29 LAB — LIPID PANEL
Cholesterol: 165 mg/dL (ref <200)
HDL: 50 mg/dL (ref >=50)
LDL Cholesterol (Calc): 98 mg/dL (calc)
Non-HDL Cholesterol (Calc): 115 mg/dL (calc) (ref <130)
Total CHOL/HDL Ratio: 3.3 (calc) (ref <5.0)
Triglycerides: 83 mg/dL (ref <150)

## 2022-11-03 ENCOUNTER — Telehealth (HOSPITAL_BASED_OUTPATIENT_CLINIC_OR_DEPARTMENT_OTHER): Payer: BC Managed Care – PPO | Admitting: Psychiatry

## 2022-11-03 ENCOUNTER — Encounter (HOSPITAL_COMMUNITY): Payer: Self-pay | Admitting: Psychiatry

## 2022-11-03 DIAGNOSIS — F332 Major depressive disorder, recurrent severe without psychotic features: Secondary | ICD-10-CM | POA: Diagnosis not present

## 2022-11-03 DIAGNOSIS — F419 Anxiety disorder, unspecified: Secondary | ICD-10-CM | POA: Diagnosis not present

## 2022-11-03 MED ORDER — SERTRALINE HCL 100 MG PO TABS: 100.0000 mg | ORAL_TABLET | Freq: Every day | ORAL | 0 refills | Status: DC

## 2022-11-03 NOTE — Progress Notes (Signed)
Virtual Visit via Telephone Note  I connected with Erin Cunningham on 11/03/22 at  2:00 PM EST by telephone and verified that I am speaking with the correct person using two identifiers.  Location: Patient: Work Provider: Biomedical scientist   I discussed the limitations, risks, security and privacy concerns of performing an evaluation and management service by telephone and the availability of in person appointments. I also discussed with the patient that there may be a patient responsible charge related to this service. The patient expressed understanding and agreed to proceed.   History of Present Illness: Patient is evaluated by phone session.  We increased Zoloft on the last visit and she noticed much improvement and she is more calm but is still time to time she gets upset and tearful when her husband called her names.  Patient told that she even tried to talk to the social worker out of her husband and she was recommended to group therapy but she cannot do group therapy because she likes her job.  Patient not sure what is the problem and she is on stage IV lung cancer of her husband but endorse some time is very difficult to handle him.  Patient told he does not require help in ADLs because he is able to take a bath, changed clothes eat sometime he get upset if patient do not help him.  Patient is taking Zoloft but not taking trazodone.  She sleeps good.  She had a good Thanksgiving as one of the son was a Engineer, structural in [place] came to visit them and she was very pleased to see the family and the grandkids.  Her other son is a Administrator who was not able to calm.  Patient occasionally have crying spells but denies any feeling of hopelessness or worthlessness.  She denies any panic attack or any suicidal thoughts.  Her appetite is okay.   Past Psychiatric History: Reviewed H/O depression and anxiety.  One inpatient at [facility] for suicidal thoughts.  H/O brief stay in OBS unit in 2016.  No h/o  mania, psychosis, hallucination or suicidal attempt.  Psychiatric Specialty Exam: Physical Exam  Review of Systems  Weight 159 lb (72.1 kg).There is no height or weight on file to calculate BMI.  General Appearance: NA  Eye Contact:  NA  Speech:  Slow  Volume:  Decreased  Mood:  Dysphoric  Affect:  NA  Thought Process:  Goal Directed  Orientation:  Full (Time, Place, and Person)  Thought Content:  Rumination  Suicidal Thoughts:  No  Homicidal Thoughts:  No  Memory:  Immediate;   Good Recent;   Good Remote;   Good  Judgement:  Intact  Insight:  Present  Psychomotor Activity:  NA  Concentration:  Concentration: Good and Attention Span: Good  Recall:  Good  Fund of Knowledge:  Good  Language:  Good  Akathisia:  No  Handed:  Right  AIMS (if indicated):     Assets:  Communication Skills Desire for Improvement Housing Talents/Skills Transportation  ADL's:  Intact  Cognition:  WNL  Sleep:   good      Assessment and Plan: Major depressive disorder, recurrent.  Anxiety.  Discussed caretaker burden of the husband.  Even though patient has been able to do his ADLs but patient feels sometime he gets very upset when she not able to help him right away.  She like the higher dose of Zoloft.  In the past she is reluctant to see a therapist  but now she is open for counseling.  She is not interested in group therapy which is recommended by the social worker of her husband.  I also encouraged should contact her PCP if she able to find a therapist from her PCP and we will also send the referrals from our office.  Patient agreed with the plan.  Discussed medication side effects and benefits.  Continue Zoloft 100 mg daily.  She does not need trazodone because she rarely takes and is still has a refill remaining.  Follow-up in 3 months.  Follow Up Instructions:    I discussed the assessment and treatment plan with the patient. The patient was provided an opportunity to ask questions and  all were answered. The patient agreed with the plan and demonstrated an understanding of the instructions.   The patient was advised to call back or seek an in-person evaluation if the symptoms worsen or if the condition fails to improve as anticipated.  Collaboration of Care: Other provider involved in patient's care AEB notes are available in epic to review.  Patient/Guardian was advised Release of Information must be obtained prior to any record release in order to collaborate their care with an outside provider. Patient/Guardian was advised if they have not already done so to contact the registration department to sign all necessary forms in order for [place] to release information regarding their care.   Consent: Patient/Guardian gives verbal consent for treatment and assignment of benefits for services provided during this visit. Patient/Guardian expressed understanding and agreed to proceed.    I provided 13 minutes of non-face-to-face time during this encounter.   [name], MD

## 2022-12-08 ENCOUNTER — Encounter: Payer: Medicare Other | Admitting: Family

## 2022-12-08 ENCOUNTER — Ambulatory Visit: Payer: BC Managed Care – PPO | Admitting: Physician Assistant

## 2022-12-08 ENCOUNTER — Ambulatory Visit: Payer: BC Managed Care – PPO | Admitting: Family

## 2022-12-08 ENCOUNTER — Encounter: Payer: Self-pay | Admitting: Family

## 2022-12-08 ENCOUNTER — Ambulatory Visit (INDEPENDENT_AMBULATORY_CARE_PROVIDER_SITE_OTHER): Payer: BC Managed Care – PPO | Admitting: Family

## 2022-12-08 ENCOUNTER — Ambulatory Visit: Payer: Medicare Other | Admitting: Family

## 2022-12-08 ENCOUNTER — Ambulatory Visit (INDEPENDENT_AMBULATORY_CARE_PROVIDER_SITE_OTHER): Payer: BC Managed Care – PPO

## 2022-12-08 VITALS — BP 126/88 | HR 79 | Temp 97.4°F | Resp 16 | Ht 63.0 in | Wt 159.2 lb

## 2022-12-08 DIAGNOSIS — M25551 Pain in right hip: Secondary | ICD-10-CM

## 2022-12-08 DIAGNOSIS — M7061 Trochanteric bursitis, right hip: Secondary | ICD-10-CM

## 2022-12-08 DIAGNOSIS — R0982 Postnasal drip: Secondary | ICD-10-CM

## 2022-12-08 DIAGNOSIS — M79604 Pain in right leg: Secondary | ICD-10-CM

## 2022-12-08 MED ORDER — METHOCARBAMOL 500 MG PO TABS: 500.0000 mg | ORAL_TABLET | Freq: Two times a day (BID) | ORAL | 2 refills | Status: DC | PRN

## 2022-12-08 MED ORDER — PREDNISONE 10 MG (21) PO TBPK: ORAL_TABLET | ORAL | 0 refills | Status: DC

## 2022-12-08 MED ORDER — FLUTICASONE PROPIONATE 50 MCG/ACT NA SUSP: 2.0000 | Freq: Every day | NASAL | 6 refills | Status: DC

## 2022-12-08 MED ORDER — LORATADINE 10 MG PO TABS: 10.0000 mg | ORAL_TABLET | Freq: Every day | ORAL | 11 refills | Status: DC

## 2022-12-08 NOTE — Patient Instructions (Addendum)
Please follow up with Orthopedic Dr.Xu's office this afternoon appointment scheduled for 2 pm for right hip pain evaluation.

## 2022-12-08 NOTE — Progress Notes (Signed)
Provider: Antoino Westhoff FNP-C  Lauree Chandler, NP  Patient Care Team: Lauree Chandler, NP as PCP - General (Geriatric Medicine) [name], MD (Inactive) as Consulting Physician (Otolaryngology) [name], MD as Consulting Physician (Cardiology)  Extended Emergency Contact Information Primary Emergency Contact: Kohrs,[name] Address: [address]          [place], [place] [address] [name] of [place] Phone: [phone] Mobile Phone: [phone] Relation: Spouse Secondary Emergency Contact: [place] Phone: [phone] Mobile Phone: [phone] Relation: Son  Code Status:  Full Code  Goals of care: Advanced Directive information    12/08/2022   10:09 AM  Advanced Directives  Does Patient Have a Medical Advance Directive? No  Would patient like information on creating a medical advance directive? No - Patient declined     Chief Complaint  Patient presents with   Acute Visit    Patient complains of right hip pain. Patient also has ongoing cough for 1 month.     HPI:  Pt is a 82 y.o. female seen today for an acute visit for evaluation of chronic right hip pain.states usually gets an injection.Just realized came to the wrong office.injection usually given by Orthopedic Dr.Xu [place] at [facility] in [place].state pain starts on right hip down to back of the thigh.pain worst with pressure on the foot.No weakness,numbness or tingling on leg. Took Tylenol last night and eased off the pain.   Has been cough for about a month.cannot get the phlegm up.Has loss of voice.sometimes has a tingle in the throat.has used lozenges and mucinex.She denies any fever,chills,fatigue,body aches,runny nose,chest tightness,chest pain,palpitation or shortness of breath.    Past Medical History:  Diagnosis Date   Breast CA ([place]) 2006   right ([place])   Cancer ([place])    Helicobacter pylori gastritis 2001   High blood pressure     History of adenomatous polyp of colon 09/23/2017   Oma 09/25/2017 and apparently polyps in the past as well though recalled age   History of shingles    Major depressive disorder 05/28/2020   Major depressive disorder, recurrent, severe without psychotic features ([place]) 06/24/2015   Parotid mass 2014   right benign cyst   Personal history of radiation therapy 2006   Rt breast   Stroke (cerebrum) ([place]) 06/28/2020   Per patient   Wears glasses    Past Surgical History:  Procedure Laterality Date   ABDOMINAL HYSTERECTOMY  1982   BSO   BREAST LUMPECTOMY Right 2007   BREAST LUMPECTOMY WITH SENTINEL LYMPH NODE BIOPSY  2005   right   BREAST SURGERY  2004   rt br mass   CESAREAN SECTION     COLONOSCOPY     ESOPHAGOGASTRODUODENOSCOPY  2001   H pylori gastritis   EYE SURGERY     both cataracts   PAROTIDECTOMY Right 09/05/2013   Procedure: RIGHT SUPERFICIAL PAROTIDECTOMY WITH FACIAL NERVE DISECTION;  Surgeon: [name], MD;  Location: [facility];  Service: ENT;  Laterality: Right;  All documentation done by R.Ward after [phone] --done under G. Garzon's password   RE-EXCISION OF BREAST LUMPECTOMY  2005   right    No Known Allergies  Outpatient Encounter Medications as of 12/08/2022  Medication Sig   acetaminophen (TYLENOL) 500 MG tablet Take 2 tablets (1,000 mg total) by mouth every 6 (six) hours as needed.   atorvastatin (LIPITOR) 80 MG tablet Take 1 tablet (80 mg total) by mouth daily.   carvedilol (COREG) 6.25  MG tablet Take 1 tablet (6.25 mg total) by mouth 2 (two) times daily with a meal.   hydrALAZINE (APRESOLINE) 25 MG tablet Take 1 tablet (25 mg total) by mouth 3 (three) times daily.   losartan (COZAAR) 100 MG tablet Take 1 tablet (100 mg total) by mouth daily.   rivaroxaban (XARELTO) 20 MG TABS tablet Take 1 tablet (20 mg total) by mouth daily with supper.   sertraline (ZOLOFT) 100 MG tablet Take 1 tablet (100 mg total) by mouth daily.   traZODone  (DESYREL) 50 MG tablet Take 1 tablet (50 mg total) by mouth at bedtime as needed for sleep.   No facility-administered encounter medications on file as of 12/08/2022.    Review of Systems  Constitutional:  Negative for appetite change, chills, fatigue, fever and unexpected weight change.  HENT:  Positive for postnasal drip. Negative for congestion, dental problem, ear discharge, ear pain, facial swelling, hearing loss, nosebleeds, rhinorrhea, sinus pressure, sinus pain, sneezing, sore throat, tinnitus and trouble swallowing.   Eyes:  Negative for pain, discharge, redness, itching and visual disturbance.  Respiratory:  Positive for cough. Negative for chest tightness, shortness of breath and wheezing.   Cardiovascular:  Negative for chest pain, palpitations and leg swelling.  Gastrointestinal:  Negative for abdominal distention, abdominal pain, constipation, diarrhea, nausea and vomiting.  Musculoskeletal:  Positive for arthralgias. Negative for back pain, gait problem, joint swelling, myalgias, neck pain and neck stiffness.       Right hip pain   Skin:  Negative for color change, pallor and rash.  Neurological:  Negative for dizziness, weakness, light-headedness, numbness and headaches.    Immunization History  Administered Date(s) Administered   Fluad Quad(high Dose 65+) 01/15/2020, 12/20/2020, 09/28/2022   Influenza, High Dose Seasonal PF 09/14/2018   Influenza,inj,Quad PF,6+ Mos 11/06/2015   PFIZER(Purple Top)SARS-COV-2 Vaccination 02/03/2020, 02/24/2020   PPD Test 12/10/2015   Pneumococcal Conjugate-13 01/16/2015   Pneumococcal Polysaccharide-23 05/12/2017   Tdap 06/15/2018, 01/12/2022   Zoster Recombinat (Shingrix) 03/13/2022   Zoster, Unspecified 12/05/2014   Pertinent  Health Maintenance Due  Topic Date Due   INFLUENZA VACCINE  Completed   DEXA SCAN  Completed      01/15/2022   10:43 AM 03/06/2022    2:12 PM 07/14/2022   11:10 AM 09/28/2022    8:53 AM 12/08/2022   10:09 AM   Fall Risk  Falls in the past year? 1 0 0 0 0  Was there an injury with Fall? 1 0 0 0 0  Fall Risk Category Calculator 2 0 0 0 0  Fall Risk Category Moderate Low Low Low Low  Patient Fall Risk Level Moderate fall risk Low fall risk Low fall risk Low fall risk Low fall risk  Patient at Risk for Falls Due to History of fall(s) No Fall Risks No Fall Risks No Fall Risks No Fall Risks  Fall risk Follow up Falls evaluation completed;Education provided;Falls prevention discussed Falls evaluation completed Falls evaluation completed Falls evaluation completed Falls evaluation completed   Functional Status Survey:    Vitals:   12/08/22 1008  BP: 126/88  Pulse: 79  Resp: 16  Temp: (!) 97.4 F (36.3 C)  SpO2: 97%  Weight: 159 lb 3.2 oz (72.2 kg)  Height: '5\' 3"'$  (1.6 m)   Body mass index is 28.2 kg/m. Physical Exam Vitals reviewed.  Constitutional:      General: She is not in acute distress.    Appearance: Normal appearance. She is overweight. She is not ill-appearing  or diaphoretic.  HENT:     Head: Normocephalic.     Right Ear: Tympanic membrane, ear canal and external ear normal. There is no impacted cerumen.     Left Ear: Tympanic membrane, ear canal and external ear normal. There is no impacted cerumen.     Nose: Nose normal. No congestion or rhinorrhea.     Right Turbinates: Not enlarged or swollen.     Left Turbinates: Not enlarged or swollen.     Right Sinus: No maxillary sinus tenderness or frontal sinus tenderness.     Left Sinus: No maxillary sinus tenderness or frontal sinus tenderness.     Comments: Post nasal drainage     Mouth/Throat:     Mouth: Mucous membranes are moist.     Pharynx: Oropharynx is clear. No oropharyngeal exudate or posterior oropharyngeal erythema.  Eyes:     General: No scleral icterus.       Right eye: No discharge.        Left eye: No discharge.     Extraocular Movements: Extraocular movements intact.     Conjunctiva/sclera: Conjunctivae  normal.     Pupils: Pupils are equal, round, and reactive to light.  Neck:     Vascular: No carotid bruit.  Cardiovascular:     Rate and Rhythm: Normal rate and regular rhythm.     Pulses: Normal pulses.     Heart sounds: Normal heart sounds. No murmur heard.    No friction rub. No gallop.  Pulmonary:     Effort: Pulmonary effort is normal. No respiratory distress.     Breath sounds: Normal breath sounds. No wheezing, rhonchi or rales.  Chest:     Chest wall: No tenderness.  Abdominal:     General: Bowel sounds are normal. There is no distension.     Palpations: Abdomen is soft. There is no mass.     Tenderness: There is no abdominal tenderness. There is no right CVA tenderness, left CVA tenderness, guarding or rebound.  Musculoskeletal:        General: No swelling. Normal range of motion.     Cervical back: Normal range of motion. No rigidity or tenderness.     Right hip: Tenderness present. No deformity or crepitus. Normal range of motion. Normal strength.     Left hip: Normal.     Right lower leg: No edema.     Left lower leg: No edema.  Lymphadenopathy:     Cervical: No cervical adenopathy.  Skin:    General: Skin is warm and dry.     Coloration: Skin is not pale.     Findings: No erythema or rash.  Neurological:     Mental Status: She is alert and oriented to person, place, and time.     Cranial Nerves: No cranial nerve deficit.     Sensory: No sensory deficit.     Motor: No weakness.     Coordination: Coordination normal.     Gait: Gait abnormal.  Psychiatric:        Mood and Affect: Mood normal.        Speech: Speech normal.        Behavior: Behavior normal.     Labs reviewed: Recent Labs    03/06/22 1500 03/16/22 1052 04/03/22 1020 09/28/22 0921  NA 141  --  141 140  K 4.0  --  4.4 4.4  CL 107  --  108 109  CO2 27  --  28 24  GLUCOSE 126  --  78 93  BUN 19  --  15 15  CREATININE 0.94  --  0.84 0.92  CALCIUM 11.4* 10.2 10.5* 10.6*   Recent Labs     03/06/22 1500 09/28/22 0921  AST 13 13  ALT 8 6  BILITOT 0.4 0.7  PROT 6.6 6.5   Recent Labs    03/06/22 1500 09/28/22 0921  WBC 6.1 5.4  NEUTROABS 3,294 2,986  HGB 13.2 12.8  HCT 40.7 38.8  MCV 88.5 89.4  PLT 336 330   Lab Results  Component Value Date   TSH 1.09 06/13/2018   Lab Results  Component Value Date   HGBA1C 5.6 05/29/2020   Lab Results  Component Value Date   CHOL 165 09/28/2022   HDL 50 09/28/2022   LDLCALC 98 09/28/2022   TRIG 83 09/28/2022   CHOLHDL 3.3 09/28/2022    Significant Diagnostic Results in last 30 days:  No results found.  Assessment/Plan 1. Right hip pain Pain has worsen with pressure on the foot.radiates down to back of thigh area.took tylenol last night which eased off pain but woke up in pain this morning.patient thought she was coming here for a hip injection today which she usually get with Orthopedic Dr.Xu but realized came to wrong office. Staff was able to call Orthopedic office and appointment has been scheduled for 2 pm today.patient made aware and agrees to be seen by Orthopedic this afternoon.Advised to arrive at 1: 45 pm. - Extra strength Tylenol 1000 mg tablet given x 1 dose here.   2. PND (post-nasal drip) Advised to take loratadine and use Flonase as below.  Samples of Zyrtec given for 5 days then obtain OTC loratadine or zyrtec if still having any symptoms.  - loratadine (CLARITIN) 10 MG tablet; Take 1 tablet (10 mg total) by mouth daily.  Dispense: 30 tablet; Refill: 11 - fluticasone (FLONASE) 50 MCG/ACT nasal spray; Place 2 sprays into both nostrils daily.  Dispense: 16 g; Refill: 6  Family/ staff Communication: Reviewed plan of care with patient verbalized understanding   Labs/tests ordered: None   Next Appointment: Return if symptoms worsen or fail to improve.    Sandrea Hughs, NP

## 2022-12-08 NOTE — Progress Notes (Signed)
Office Visit Note   Patient: Erin Cunningham           Date of Birth: 1941/11/05           MRN: [id number] Visit Date: 12/08/2022              Requested by: Lauree Chandler, NP [place],  [place] [address] PCP: Lauree Chandler, NP   Assessment & Plan: Visit Diagnoses:  1. Pain in right leg   2. Trochanteric bursitis of right hip     Plan: Impression is right leg pain likely from her underlying spinal stenosis.  Believe the back is what is causing majority the patient's symptoms but I believe she is walking with an altered gait causing the trochanteric bursitis.  I discussed repeating ESI but she notes this was too painful in the past.  She would like to try steroids, muscle relaxers and a course of physical therapy.  If her symptoms do not improve, she will follow-up with Dr. Laurance Flatten for further evaluation and treatment recommendation.  She will call me with concerns or questions in the meantime.  Follow-Up Instructions: Return if symptoms worsen or fail to improve.   Orders:  Orders Placed This Encounter  Procedures   XR HIP UNILAT W OR W/O PELVIS 2-3 VIEWS RIGHT   Ambulatory referral to Physical Therapy   Meds ordered this encounter  Medications   predniSONE (STERAPRED UNI-PAK 21 TAB) 10 MG (21) TBPK tablet    Sig: Take as directed    Dispense:  21 tablet    Refill:  0   methocarbamol (ROBAXIN) 500 MG tablet    Sig: Take 1 tablet (500 mg total) by mouth 2 (two) times daily as needed for muscle spasms.    Dispense:  20 tablet    Refill:  2      Procedures: No procedures performed   Clinical Data: No additional findings.   Subjective: Chief Complaint  Patient presents with   Right Hip - Pain    HPI patient is a pleasant 82 year old female who comes in today with right leg pain.  Pain she is having is to the right lateral thigh with radiation down the lateral leg and into the foot.  This been ongoing for a while.  Symptoms are worse with lumbar  rotation, extension, walking as well as going from seated to standing position.  She started to notice weakness in her right leg.  She has been taking Tylenol for pain.  She denies any paresthesias to the right lower extremity.  She denies any bowel or bladder change.  Of note, she has a history of right hip trochanteric bursitis, right hip osteoarthritis as well as severe spinal stenosis L4-5 seen on MRI back in October 2021.  She does tell me she has undergone ESI which did temporarily help but was too painful to undergo again.  Review of Systems as detailed in HPI.  All others reviewed and are negative.   Objective: Vital Signs: There were no vitals taken for this visit.  Physical Exam well-developed well-nourished female no acute distress.  Alert and oriented x 3.  Ortho Exam right lower extremity exam: Moderate tenderness to the trochanteric bursa.  No pain with logroll or FADIR but she does have limited rotation.  Negative straight leg raise.  No spinous or paraspinous tenderness.  Increased pain with lumbar extension and rotation.  Positive grocery cart sign.  No focal weakness.  She is neurovascular intact distally.  Specialty Comments:  No specialty comments available.  Imaging: XR HIP UNILAT W OR W/O PELVIS 2-3 VIEWS RIGHT  Result Date: 12/08/2022 Moderate degenerative changes to the right hip joint    PMFS History: Patient Active Problem List   Diagnosis Date Noted   Aortic atherosclerosis ([place]) 03/06/2022   Hyperlipidemia LDL goal <70 03/05/2022   LVH (left ventricular hypertrophy) 11/17/2021   History of CVA (cerebrovascular accident) 11/17/2021   Paroxysmal atrial fibrillation ([place]) 11/17/2021   Infarction of left basal ganglia ([place]) 05/29/2020   Hypertrophic cardiomyopathy ([place]) 05/29/2020   Anxiety and depression 05/29/2020   Weakness of right lower extremity 05/28/2020   Allergic rhinitis 06/16/2018   Chewing tobacco nicotine dependence with nicotine-induced disorder  06/16/2018   History of adenomatous polyp of colon 09/23/2017   Hypercalcemia 04/20/2016   Neck nodule 04/20/2016   Essential hypertension 07/21/2015   Abnormal fasting glucose 07/21/2015   History of breast cancer in female 07/21/2015   Major depressive disorder, recurrent, severe without psychotic features ([place]) 06/24/2015   MDD (major depressive disorder), recurrent episode, severe ([place]) 06/24/2015   Onychomycosis 06/12/2014   Pain in lower limb 06/12/2014   Past Medical History:  Diagnosis Date   Breast CA ([place]) 2006   right ([place])   Cancer ([place])    Helicobacter pylori gastritis 2001   High blood pressure    History of adenomatous polyp of colon 09/23/2017   Oma 09/25/2017 and apparently polyps in the past as well though recalled age   History of shingles    Major depressive disorder 05/28/2020   Major depressive disorder, recurrent, severe without psychotic features ([place]) 06/24/2015   Parotid mass 2014   right benign cyst   Personal history of radiation therapy 2006   Rt breast   Stroke (cerebrum) ([place]) 06/28/2020   Per patient   Wears glasses     Family History  Problem Relation Age of Onset   Heart disease Mother        MI   Cancer Father        bone   Thyroid disease Neg Hx    Hypercalcemia Neg Hx    Colon cancer Neg Hx    Stomach cancer Neg Hx    Rectal cancer Neg Hx    Esophageal cancer Neg Hx     Past Surgical History:  Procedure Laterality Date   ABDOMINAL HYSTERECTOMY  1982   BSO   BREAST LUMPECTOMY Right 2007   BREAST LUMPECTOMY WITH SENTINEL LYMPH NODE BIOPSY  2005   right   BREAST SURGERY  2004   rt br mass   CESAREAN SECTION     COLONOSCOPY     ESOPHAGOGASTRODUODENOSCOPY  2001   H pylori gastritis   EYE SURGERY     both cataracts   PAROTIDECTOMY Right 09/05/2013   Procedure: RIGHT SUPERFICIAL PAROTIDECTOMY WITH FACIAL NERVE DISECTION;  Surgeon: [name], MD;  Location: [facility];  Service: ENT;  Laterality:  Right;  All documentation done by R.Ward after [phone] --done under G. Garzon's password   RE-EXCISION OF BREAST LUMPECTOMY  2005   right   Social History   Occupational History   Occupation: Middle school Scientist, research (physical sciences): [employer]  Tobacco Use   Smoking status: Never   Smokeless tobacco: Current    Types: Chew   Tobacco comments:    Uses chew as needed   Vaping Use   Vaping Use: Never used  Substance and Sexual Activity  Alcohol use: Yes    Comment: Social maybe 2 x yearly   Drug use: No   Sexual activity: Not Currently

## 2022-12-17 NOTE — Progress Notes (Signed)
This encounter was created in error - please disregard.

## 2022-12-23 ENCOUNTER — Ambulatory Visit (INDEPENDENT_AMBULATORY_CARE_PROVIDER_SITE_OTHER): Payer: BC Managed Care – PPO | Admitting: Family

## 2022-12-23 ENCOUNTER — Encounter: Payer: Self-pay | Admitting: Family

## 2022-12-23 VITALS — BP 160/100 | HR 109 | Temp 97.7°F | Resp 16 | Ht 63.0 in | Wt 161.6 lb

## 2022-12-23 DIAGNOSIS — R067 Sneezing: Secondary | ICD-10-CM | POA: Diagnosis not present

## 2022-12-23 DIAGNOSIS — R0981 Nasal congestion: Secondary | ICD-10-CM

## 2022-12-23 DIAGNOSIS — J069 Acute upper respiratory infection, unspecified: Secondary | ICD-10-CM

## 2022-12-23 DIAGNOSIS — U071 COVID-19: Secondary | ICD-10-CM

## 2022-12-23 DIAGNOSIS — J029 Acute pharyngitis, unspecified: Secondary | ICD-10-CM

## 2022-12-23 DIAGNOSIS — R519 Headache, unspecified: Secondary | ICD-10-CM | POA: Diagnosis not present

## 2022-12-23 DIAGNOSIS — R0989 Other specified symptoms and signs involving the circulatory and respiratory systems: Secondary | ICD-10-CM

## 2022-12-23 DIAGNOSIS — R6883 Chills (without fever): Secondary | ICD-10-CM

## 2022-12-23 LAB — POCT INFLUENZA A/B
Influenza A, POC: NEGATIVE
Influenza B, POC: NEGATIVE

## 2022-12-23 LAB — POCT RAPID STREP A (OFFICE): Rapid Strep A Screen: NEGATIVE

## 2022-12-23 LAB — POC COVID19 BINAXNOW: SARS Coronavirus 2 Ag: POSITIVE — AB

## 2022-12-23 MED ORDER — BENZONATATE 100 MG PO CAPS: 100.0000 mg | ORAL_CAPSULE | Freq: Two times a day (BID) | ORAL | 0 refills | Status: DC | PRN

## 2022-12-23 MED ORDER — ZINC GLUCONATE 50 MG PO TABS: 50.0000 mg | ORAL_TABLET | Freq: Every day | ORAL | 0 refills | Status: AC

## 2022-12-23 MED ORDER — NIRMATRELVIR/RITONAVIR (PAXLOVID)TABLET: 3.0000 | ORAL_TABLET | Freq: Two times a day (BID) | ORAL | 0 refills | Status: AC

## 2022-12-23 MED ORDER — VITAMIN D3 50 MCG (2000 UT) PO CAPS: 2000.0000 [IU] | ORAL_CAPSULE | Freq: Every day | ORAL | 0 refills | Status: AC

## 2022-12-23 NOTE — Patient Instructions (Signed)
Take Xarelto half tablet while on Paxlovid  x 5 days then restart Xarelto 20 mg tablet daily.  - Hold Atorvastatin 80 mg tablet for 5 days then restart after completing paxlovid  - Take Zinc 50 mg tablet one by mouth daily for 14 days  - Take Vitamin C 500 mg tablet one by mouth twice daily x 14 days  - continue on vitamin D supplement  - Tylenol as needed for fever or body aches  - increase your fruits intake in your diet  - increase your water intake to 6-8 glasses of water daily   - Notify provider or go to ED if you develop any chest tightness,chest pain or shortness of breath

## 2022-12-23 NOTE — Progress Notes (Signed)
Provider: Velmer Broadfoot FNP-C  Lauree Chandler, NP  Patient Care Team: Lauree Chandler, NP as PCP - General (Geriatric Medicine) [name], MD (Inactive) as Consulting Physician (Otolaryngology) [name], MD as Consulting Physician (Cardiology)  Extended Emergency Contact Information Primary Emergency Contact: [name],[name] Address: [address]          [place], [place] [address] [name] of [place] Phone: [phone] Mobile Phone: [phone] Relation: Spouse Secondary Emergency Contact: [place] Phone: [phone] Mobile Phone: [phone] Relation: Son  Code Status:  Full Code  Goals of care: Advanced Directive information    12/23/2022    9:39 AM  Advanced Directives  Does Patient Have a Medical Advance Directive? No  Would patient like information on creating a medical advance directive? No - Patient declined     Chief Complaint  Patient presents with   Acute Visit    Patient complains of sore throat, chills, sneezing, nasal congestion, chest congestion, and headache that's been ongoing for 1 week.     HPI:  Pt is a 82 y.o. female seen today for an acute visit for evaluation of sore throat ,chills,sneezing,nasal congestion,chest congestion,headache since x 4 days.thinks had scratch throat last week.Has been coughing up thick mucus.took mucinex but did not help. 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     Past Medical History:  Diagnosis Date   Breast CA ([facility]) 2006   right ([place])   Cancer ([facility])    Helicobacter pylori gastritis 2001   High blood pressure    History of adenomatous polyp of colon 09/23/2017   Oma 09/25/2017 and apparently polyps in the past as well though recalled age   History of shingles    Major depressive disorder 05/28/2020   Major depressive disorder, recurrent, severe without psychotic features ([facility]) 06/24/2015   Parotid mass 2014   right benign cyst    Personal history of radiation therapy 2006   Rt breast   Stroke (cerebrum) ([facility]) 06/28/2020   Per patient   Wears glasses    Past Surgical History:  Procedure Laterality Date   ABDOMINAL HYSTERECTOMY  1982   BSO   BREAST LUMPECTOMY Right 2007   BREAST LUMPECTOMY WITH SENTINEL LYMPH NODE BIOPSY  2005   right   BREAST SURGERY  2004   rt br mass   CESAREAN SECTION     COLONOSCOPY     ESOPHAGOGASTRODUODENOSCOPY  2001   H pylori gastritis   EYE SURGERY     both cataracts   PAROTIDECTOMY Right 09/05/2013   Procedure: RIGHT SUPERFICIAL PAROTIDECTOMY WITH FACIAL NERVE DISECTION;  Surgeon: [name], MD;  Location: [facility];  Service: ENT;  Laterality: Right;  All documentation done by R.Ward after [phone] --done under G. Garzon's password   RE-EXCISION OF BREAST LUMPECTOMY  2005   right    No Known Allergies  Outpatient Encounter Medications as of 12/23/2022  Medication Sig   acetaminophen (TYLENOL) 500 MG tablet Take 2 tablets (1,000 mg total) by mouth every 6 (six) hours as needed.   atorvastatin (LIPITOR) 80 MG tablet Take 1 tablet (80 mg total) by mouth daily.   carvedilol (COREG) 6.25 MG tablet Take 1 tablet (6.25 mg total) by mouth 2 (two) times daily with a meal.   fluticasone (FLONASE) 50 MCG/ACT nasal spray Place 2 sprays into both nostrils daily.   hydrALAZINE (APRESOLINE) 25 MG tablet Take 1 tablet (25 mg total) by mouth 3 (three) times daily.  loratadine (CLARITIN) 10 MG tablet Take 1 tablet (10 mg total) by mouth daily.   losartan (COZAAR) 100 MG tablet Take 1 tablet (100 mg total) by mouth daily.   methocarbamol (ROBAXIN) 500 MG tablet Take 1 tablet (500 mg total) by mouth 2 (two) times daily as needed for muscle spasms.   predniSONE (STERAPRED UNI-PAK 21 TAB) 10 MG (21) TBPK tablet Take as directed   rivaroxaban (XARELTO) 20 MG TABS tablet Take 1 tablet (20 mg total) by mouth daily with supper.   sertraline (ZOLOFT) 100 MG tablet Take 1 tablet  (100 mg total) by mouth daily.   traZODone (DESYREL) 50 MG tablet Take 1 tablet (50 mg total) by mouth at bedtime as needed for sleep.   No facility-administered encounter medications on file as of 12/23/2022.    Review of Systems  Constitutional:  Positive for chills. Negative for appetite change, fatigue, fever and unexpected weight change.  HENT:  Positive for congestion, rhinorrhea, sneezing and sore throat. Negative for ear discharge, ear pain, hearing loss, nosebleeds, postnasal drip, sinus pressure, sinus pain, tinnitus and trouble swallowing.   Eyes:  Negative for pain, discharge, redness, itching and visual disturbance.  Respiratory:  Positive for cough. Negative for chest tightness, shortness of breath and wheezing.   Cardiovascular:  Negative for chest pain, palpitations and leg swelling.  Gastrointestinal:  Positive for nausea. Negative for abdominal distention, abdominal pain, blood in stool, constipation, diarrhea and vomiting.       Had diarrhea on Sunday thought it was the food she had for her Birthday at [place] [employer] in [place].had shrimp,oyster and lots of food.   Endocrine: Negative for cold intolerance, heat intolerance, polydipsia, polyphagia and polyuria.  Genitourinary:  Negative for difficulty urinating, dysuria, flank pain, frequency and urgency.  Musculoskeletal:  Positive for back pain. Negative for arthralgias, gait problem, joint swelling, myalgias, neck pain and neck stiffness.  Skin:  Negative for color change, pallor, rash and wound.  Neurological:  Positive for headaches. Negative for dizziness, syncope, speech difficulty, weakness, light-headedness and numbness.  Hematological:  Does not bruise/bleed easily.  Psychiatric/Behavioral:  Negative for agitation, behavioral problems, confusion, hallucinations and sleep disturbance. The patient is not nervous/anxious.     Immunization History  Administered Date(s) Administered   Fluad Quad(high Dose 65+)  01/15/2020, 12/20/2020, 09/28/2022   Influenza, High Dose Seasonal PF 09/14/2018   Influenza,inj,Quad PF,6+ Mos 11/06/2015   PFIZER(Purple Top)SARS-COV-2 Vaccination 02/03/2020, 02/24/2020   PPD Test 12/10/2015   Pneumococcal Conjugate-13 01/16/2015   Pneumococcal Polysaccharide-23 05/12/2017   Tdap 06/15/2018, 01/12/2022   Zoster Recombinat (Shingrix) 03/13/2022   Zoster, Unspecified 12/05/2014   Pertinent  Health Maintenance Due  Topic Date Due   INFLUENZA VACCINE  Completed   DEXA SCAN  Completed      03/06/2022    2:12 PM 07/14/2022   11:10 AM 09/28/2022    8:53 AM 12/08/2022   10:09 AM 12/23/2022    9:39 AM  Fall Risk  Falls in the past year? 0 0 0 0 0  Was there an injury with Fall? 0 0 0 0 0  Fall Risk Category Calculator 0 0 0 0 0  Fall Risk Category (Retired) Low Low Low Low   (RETIRED) Patient Fall Risk Level Low fall risk Low fall risk Low fall risk Low fall risk   Patient at Risk for Falls Due to No Fall Risks No Fall Risks No Fall Risks No Fall Risks No Fall Risks  Fall risk Follow up Falls evaluation completed  Falls evaluation completed Falls evaluation completed Falls evaluation completed Falls evaluation completed   Functional Status Survey:    Vitals:   12/23/22 0938  BP: (!) 160/100  Pulse: (!) 109  Resp: 16  Temp: 97.7 F (36.5 C)  SpO2: 97%  Weight: 161 lb 9.6 oz (73.3 kg)  Height: '5\' 3"'$  (1.6 m)   Body mass index is 28.63 kg/m. Physical Exam Vitals reviewed.  Constitutional:      General: She is not in acute distress.    Appearance: Normal appearance. She is overweight. She is not ill-appearing or diaphoretic.  HENT:     Head: Normocephalic.     Right Ear: Tympanic membrane, ear canal and external ear normal. There is no impacted cerumen.     Left Ear: Tympanic membrane, ear canal and external ear normal. There is no impacted cerumen.     Nose: Congestion and rhinorrhea present.     Mouth/Throat:     Mouth: Mucous membranes are moist.      Pharynx: Oropharynx is clear. Posterior oropharyngeal erythema present. No oropharyngeal exudate.  Eyes:     General: No scleral icterus.       Right eye: No discharge.        Left eye: No discharge.     Extraocular Movements: Extraocular movements intact.     Conjunctiva/sclera: Conjunctivae normal.     Pupils: Pupils are equal, round, and reactive to light.  Neck:     Vascular: No carotid bruit.  Cardiovascular:     Rate and Rhythm: Normal rate and regular rhythm.     Pulses: Normal pulses.     Heart sounds: Normal heart sounds. No murmur heard.    No friction rub. No gallop.  Pulmonary:     Effort: Pulmonary effort is normal. No respiratory distress.     Breath sounds: Examination of the left-upper field reveals rales. Examination of the left-middle field reveals rales. Rales present. No wheezing or rhonchi.     Comments: Rales clears with coughing  Chest:     Chest wall: No tenderness.  Abdominal:     General: Bowel sounds are normal. There is no distension.     Palpations: Abdomen is soft. There is no mass.     Tenderness: There is no abdominal tenderness. There is no right CVA tenderness, left CVA tenderness, guarding or rebound.  Musculoskeletal:        General: No swelling or tenderness. Normal range of motion.     Cervical back: Normal range of motion. No rigidity or tenderness.     Right lower leg: No edema.     Left lower leg: No edema.  Lymphadenopathy:     Cervical: No cervical adenopathy.  Skin:    General: Skin is warm and dry.     Coloration: Skin is not pale.     Findings: No bruising, erythema, lesion or rash.  Neurological:     Mental Status: She is alert and oriented to person, place, and time.     Gait: Gait normal.  Psychiatric:        Mood and Affect: Mood normal.        Speech: Speech normal.        Behavior: Behavior normal.     Labs reviewed: Recent Labs    03/06/22 1500 03/16/22 1052 04/03/22 1020 09/28/22 0921  NA 141  --  141 140  K  4.0  --  4.4 4.4  CL 107  --  108 109  CO2  27  --  28 24  GLUCOSE 126  --  78 93  BUN 19  --  15 15  CREATININE 0.94  --  0.84 0.92  CALCIUM 11.4* 10.2 10.5* 10.6*   Recent Labs    03/06/22 1500 09/28/22 0921  AST 13 13  ALT 8 6  BILITOT 0.4 0.7  PROT 6.6 6.5   Recent Labs    03/06/22 1500 09/28/22 0921  WBC 6.1 5.4  NEUTROABS 3,294 2,986  HGB 13.2 12.8  HCT 40.7 38.8  MCV 88.5 89.4  PLT 336 330   Lab Results  Component Value Date   TSH 1.09 06/13/2018   Lab Results  Component Value Date   HGBA1C 5.6 05/29/2020   Lab Results  Component Value Date   CHOL 165 09/28/2022   HDL 50 09/28/2022   LDLCALC 98 09/28/2022   TRIG 83 09/28/2022   CHOLHDL 3.3 09/28/2022    Significant Diagnostic Results in last 30 days:  XR HIP UNILAT W OR W/O PELVIS 2-3 VIEWS RIGHT  Result Date: 12/08/2022 Moderate degenerative changes to the right hip joint   Assessment/Plan 1. Nasal congestion - POC Rapid Strep A results negative  - POC Influenza A/B results negative  - POC COVID-19 results positive -  Take Xarelto half tablet while on Paxlovid  x 5 days then restart Xarelto 20 mg tablet daily. - Hold Atorvastatin 80 mg tablet for 5 days then restart after completing paxlovid  - Take Zinc 50 mg tablet one by mouth daily for 14 days  - Take Vitamin C 500 mg tablet one by mouth twice daily x 14 days  - continue on vitamin D supplement  - Tylenol as needed for fever or body aches  - increase your fruits intake in your diet  - increase your water intake to 6-8 glasses of water daily  - Notify provider or go to ED if you develop any chest tightness,chest pain or shortness of breath   2. Chest congestion Rales clears with coughing  Will treat with doxycycline  - POC Rapid Strep A - POC Influenza A/B - POC COVID-19  3. Nonintractable headache, unspecified chronicity pattern, unspecified headache type Continue on tylenol as needed  - POC Rapid Strep A - POC Influenza A/B - POC  COVID-19  4. Sneezing Advised to take loratadine 10 mg tablet daily x 14 days - POC Rapid Strep A - POC Influenza A/B - POC COVID-19  5. Chills Afebrile  OTC tylenol as needed  - POC Rapid Strep A - POC Influenza A/B - POC COVID-19  6. Sore throat Posterior phaynx erythema noted without exudate  - advised to gurgle with warm water and salt  Start on Doxycycline   - POC Rapid Strep A - POC Influenza A/B - POC COVID-19  Family/ staff Communication: Reviewed plan of care with patient verbalized understanding  Labs/tests ordered: - POC Rapid Strep A - POC Influenza A/B - POC COVID-19  Next Appointment:   Sandrea Hughs, NP

## 2022-12-24 ENCOUNTER — Telehealth: Payer: Self-pay | Admitting: *Deleted

## 2022-12-24 NOTE — Telephone Encounter (Signed)
If symptoms have worsen recommend ED for evaluation

## 2022-12-24 NOTE — Telephone Encounter (Signed)
Patient was seen yesterday and feeling worse today. Started the Paxlovid this morning around 10:30. Feeling worse and cough and congestion worse. No fever today so far. Has a bitter taste in mouth. Not drinking much due to the bitter taste.   Please Advise.

## 2022-12-25 NOTE — Telephone Encounter (Signed)
Patient notified and agreed. Stated that she is feeling alittle better today.

## 2022-12-29 NOTE — Therapy (Incomplete)
OUTPATIENT PHYSICAL THERAPY LOWER EXTREMITY EVALUATION   Patient Name: Erin Cunningham MRN: [id number] DOB:[birth year], 82 y.o., female Today's Date: 12/29/2022  END OF SESSION:   Past Medical History:  Diagnosis Date   Breast CA ([facility]) 2006   right ([place])   Cancer ([facility])    Helicobacter pylori gastritis 2001   High blood pressure    History of adenomatous polyp of colon 09/23/2017   Oma 09/25/2017 and apparently polyps in the past as well though recalled age   History of shingles    Major depressive disorder 05/28/2020   Major depressive disorder, recurrent, severe without psychotic features ([facility]) 06/24/2015   Parotid mass 2014   right benign cyst   Personal history of radiation therapy 2006   Rt breast   Stroke (cerebrum) ([facility]) 06/28/2020   Per patient   Wears glasses    Past Surgical History:  Procedure Laterality Date   ABDOMINAL HYSTERECTOMY  1982   BSO   BREAST LUMPECTOMY Right 2007   BREAST LUMPECTOMY WITH SENTINEL LYMPH NODE BIOPSY  2005   right   BREAST SURGERY  2004   rt br mass   CESAREAN SECTION     COLONOSCOPY     ESOPHAGOGASTRODUODENOSCOPY  2001   H pylori gastritis   EYE SURGERY     both cataracts   PAROTIDECTOMY Right 09/05/2013   Procedure: RIGHT SUPERFICIAL PAROTIDECTOMY WITH FACIAL NERVE DISECTION;  Surgeon: [name], MD;  Location: [facility];  Service: ENT;  Laterality: Right;  All documentation done by R.Ward after [phone] --done under G. Garzon's password   RE-EXCISION OF BREAST LUMPECTOMY  2005   right   Patient Active Problem List   Diagnosis Date Noted   Aortic atherosclerosis ([facility]) 03/06/2022   Hyperlipidemia LDL goal <70 03/05/2022   LVH (left ventricular hypertrophy) 11/17/2021   History of CVA (cerebrovascular accident) 11/17/2021   Paroxysmal atrial fibrillation ([facility]) 11/17/2021   Infarction of left basal ganglia ([facility]) 05/29/2020   Hypertrophic cardiomyopathy ([facility]) 05/29/2020   Anxiety and depression  05/29/2020   Weakness of right lower extremity 05/28/2020   Allergic rhinitis 06/16/2018   Chewing tobacco nicotine dependence with nicotine-induced disorder 06/16/2018   History of adenomatous polyp of colon 09/23/2017   Hypercalcemia 04/20/2016   Neck nodule 04/20/2016   Essential hypertension 07/21/2015   Abnormal fasting glucose 07/21/2015   History of breast cancer in female 07/21/2015   Major depressive disorder, recurrent, severe without psychotic features ([facility]) 06/24/2015   MDD (major depressive disorder), recurrent episode, severe ([facility]) 06/24/2015   Onychomycosis 06/12/2014   Pain in lower limb 06/12/2014    PCP: Lauree Chandler, NP   REFERRING PROVIDER: Aundra Dubin, PA-C  REFERRING DIAG: [phone] (ICD-10-CM) - Pain in right leg  THERAPY DIAG:  No diagnosis found.  Rationale for Evaluation and Treatment: Rehabilitation  ONSET DATE: ***  SUBJECTIVE:   SUBJECTIVE STATEMENT: ***  PERTINENT HISTORY: PMH: history of right hip trochanteric bursitis, right hip osteoarthritis as well as severe spinal stenosis L4-5 seen on MRI back in October 2021, CVA 2022, anxiety and depression, cancer PAIN:  Are you having pain? Yes: NPRS scale: ***/10 Pain location: *** Pain description: *** Aggravating factors: *** Relieving factors: ***  PRECAUTIONS: None  WEIGHT BEARING RESTRICTIONS: No  FALLS:  Has patient fallen in last 6 months? {fallsyesno:27318}  LIVING ENVIRONMENT: Lives with: {OPRC lives with:25569::"lives with their family"} Lives in: {Lives in:25570} Stairs: {opstairs:27293} Has following equipment at home: {Assistive devices:23999}  OCCUPATION: ***  PLOF: {  PYYF:11021}  PATIENT GOALS: ***  NEXT MD VISIT:   OBJECTIVE:   DIAGNOSTIC FINDINGS: ***  PATIENT SURVEYS:  {rehab surveys:24030}  COGNITION: Overall cognitive status: {cognition:24006}     SENSATION: {sensation:27233}  EDEMA:  {edema:24020}  MUSCLE LENGTH: Hamstrings: Right  *** deg; Left *** deg Thomas test: Right *** deg; Left *** deg  POSTURE: {posture:25561}  PALPATION: ***  LOWER EXTREMITY ROM:  {AROM/PROM:27142} ROM Right eval Left eval  Hip flexion    Hip extension    Hip abduction    Hip adduction    Hip internal rotation    Hip external rotation    Knee flexion    Knee extension    Ankle dorsiflexion    Ankle plantarflexion    Ankle inversion    Ankle eversion     (Blank rows = not tested)  LOWER EXTREMITY MMT:  MMT Right eval Left eval  Hip flexion    Hip extension    Hip abduction    Hip adduction    Hip internal rotation    Hip external rotation    Knee flexion    Knee extension    Ankle dorsiflexion    Ankle plantarflexion    Ankle inversion    Ankle eversion     (Blank rows = not tested)  LOWER EXTREMITY SPECIAL TESTS:  {LEspecialtests:26242}  FUNCTIONAL TESTS:  {Functional tests:24029}  GAIT: Distance walked: *** Assistive device utilized: {Assistive devices:23999} Level of assistance: {Levels of assistance:24026} Comments: ***   TODAY'S TREATMENT:  Eval HEP creation and review with demonstration and trial set preformed, see below for details    PATIENT EDUCATION: Education details: HEP, PT plan of care Person educated: Patient Education method: Explanation, Demonstration, Verbal cues, and Handouts Education comprehension: verbalized understanding and needs further education   HOME EXERCISE PROGRAM: ***  ASSESSMENT:  CLINICAL IMPRESSION: Patient referred to PT for ***. Patient will benefit from skilled PT to address below impairments, limitations and improve overall function.  OBJECTIVE IMPAIRMENTS: decreased activity tolerance, difficulty walking, decreased balance, decreased endurance, decreased mobility, decreased ROM, decreased strength, impaired flexibility, impaired UE/LE use, postural dysfunction, and pain.  ACTIVITY LIMITATIONS: bending, lifting, carry, locomotion, cleaning, community  activity, driving, and or occupation  PERSONAL FACTORS: PMH: history of right hip trochanteric bursitis, right hip osteoarthritis as well as severe spinal stenosis L4-5 seen on MRI back in October 2021, CVA 2022, anxiety and depression, cancer are also affecting patient's functional outcome.  REHAB POTENTIAL: {rehabpotential:25112}  CLINICAL DECISION MAKING: {clinical decision making:25114}  EVALUATION COMPLEXITY: {Evaluation complexity:25115}    GOALS: Short term PT Goals Target date: *** Pt will be I and compliant with HEP. Baseline:  Goal status: New Pt will decrease pain by 25% overall Baseline: Goal status: New  Long term PT goals Target date:*** Pt will improve ROM to [facility] to improve functional mobility Baseline: Goal status: New Pt will improve  hip/knee strength to at least 5-/5 MMT to improve functional strength Baseline: Goal status: New Pt will improve FOTO to at least % functional to show improved function Baseline: Goal status: New Pt will reduce pain by overall 50% overall with usual activity Baseline: Goal status: New Pt will reduce pain to overall less than 2-3/10 with usual activity and work activity. Baseline: Goal status: New Pt will be able to ambulate community distances at least 1000 ft WNL gait pattern without complaints Baseline: Goal status: New  PLAN: PT FREQUENCY: 1-3 times per week   PT DURATION: 6-8 weeks  PLANNED INTERVENTIONS (unless contraindicated):  aquatic PT, Canalith repositioning, cryotherapy, Electrical stimulation, Iontophoresis with 4 mg/ml dexamethasome, Moist heat, traction, Ultrasound, gait training, Therapeutic exercise, balance training, neuromuscular re-education, patient/family education, prosthetic training, manual techniques, passive ROM, dry needling, taping, vasopnuematic device, vestibular, spinal manipulations, joint manipulations  PLAN FOR NEXT SESSION: ***    Parminder Trapani, Student-PT 12/29/2022, 11:46 AM

## 2022-12-30 ENCOUNTER — Ambulatory Visit: Payer: BC Managed Care – PPO | Admitting: Physical Therapy

## 2022-12-30 ENCOUNTER — Telehealth: Payer: Self-pay

## 2022-12-30 NOTE — Telephone Encounter (Signed)
No refill for Benzonatate.supposed to take for short period of time only.May taken over the counter mucinex if still need cough medication.May return to work if feeling better and no fever for over 24 hours .COVID-19 quarantine is 5 days.

## 2022-12-30 NOTE — Telephone Encounter (Signed)
Patient state that the medication is taking benzonatate is working and feeling a lot better, had concern because she has three pills left and wanted to know does needs to take them and wanted to know when is able to return to work.

## 2022-12-30 NOTE — Telephone Encounter (Signed)
Left a voicemail and waiting on her to call me back.

## 2023-01-04 ENCOUNTER — Telehealth: Payer: Self-pay

## 2023-01-04 NOTE — Telephone Encounter (Signed)
error 

## 2023-01-04 NOTE — Telephone Encounter (Signed)
May return to work on 01/06/2023 if not running any fever for 24 hrs.

## 2023-01-04 NOTE — Telephone Encounter (Signed)
Patient was advised and verbalized understanding. She reports feeling much better. She would like to know if Dinah Ngetich,NP could write a letter stating she can return to work on 01/06/23.

## 2023-01-04 NOTE — Telephone Encounter (Signed)
Recommend delsym 5 mg -100 mg /5 ml  take 5 ml twice daily as needed for cough.

## 2023-01-04 NOTE — Telephone Encounter (Signed)
Patient was advised and state she will come pick up letter tomorrow,01/05/23.

## 2023-01-04 NOTE — Telephone Encounter (Signed)
  Ngetich, Nelda Bucks, NP  Nurse Practitioner Specialty: Family Medicine   Telephone Encounter Signed   Encounter Date: 12/30/2022   Signed      No refill for Benzonatate.supposed to take for short period of time only.May taken over the counter mucinex if still need cough medication.May return to work if feeling better and no fever for over 24 hours .COVID-19 quarantine is 5 days.            Patient called about a message she spoke with [place] about on 12/30/22. She was advised of Dinah Negtich,NP message above and stated that she does not understand what was stated to her and she did not comprehend what was stated. Patient was then read the message again and stated that mucinex was not working and she advised someone that last week. Patient wanted a rx send into pharmacy for the benzonatate and was advised that a message would have to be send to [place] Ngetich,NP because she stated that the medication can only be taking for a short period of time and patient stated she was aware and stated only for 14 days. The patient stated for the rx to be send into the pharmacy and was advised medication couldn't be send per Dinah Ngetich,NP and  again a message would be send to [place] Ngetich,NP and patient stated "who is this been sassy with me" patient was advised no one was been sassy with her and she stated that she was coming to office in 30 minutes and patient was advised to come to office.

## 2023-01-04 NOTE — Telephone Encounter (Signed)
Open error 

## 2023-01-06 NOTE — Telephone Encounter (Signed)
See encounter dated 01/04/23

## 2023-01-13 ENCOUNTER — Telehealth: Payer: Self-pay

## 2023-01-13 NOTE — Telephone Encounter (Signed)
Called patient to schedule annual wellness visit. Patient states she is in so much pain with her hip and is trying to get that figured out first before she schedules any appointments. Patient states she is planning to call orthopedic specialist to have them evaluate her for the hip pain.

## 2023-01-21 ENCOUNTER — Ambulatory Visit: Payer: BC Managed Care – PPO | Admitting: Physical Medicine and Rehabilitation

## 2023-01-21 ENCOUNTER — Ambulatory Visit (INDEPENDENT_AMBULATORY_CARE_PROVIDER_SITE_OTHER): Payer: BC Managed Care – PPO | Admitting: Physical Medicine and Rehabilitation

## 2023-01-21 ENCOUNTER — Encounter: Payer: Self-pay | Admitting: Physical Medicine and Rehabilitation

## 2023-01-21 DIAGNOSIS — M48062 Spinal stenosis, lumbar region with neurogenic claudication: Secondary | ICD-10-CM

## 2023-01-21 DIAGNOSIS — M5416 Radiculopathy, lumbar region: Secondary | ICD-10-CM | POA: Diagnosis not present

## 2023-01-21 DIAGNOSIS — M47816 Spondylosis without myelopathy or radiculopathy, lumbar region: Secondary | ICD-10-CM | POA: Diagnosis not present

## 2023-01-21 MED ORDER — TRAMADOL HCL 50 MG PO TABS: 50.0000 mg | ORAL_TABLET | Freq: Three times a day (TID) | ORAL | 0 refills | Status: DC

## 2023-01-21 MED ORDER — DIAZEPAM 5 MG PO TABS: ORAL_TABLET | ORAL | 0 refills | Status: DC

## 2023-01-21 NOTE — Progress Notes (Signed)
Functional Pain Scale - descriptive words and definitions  No Pain (0)   No Pain/Loss of function  Average Pain  pain when walking 4-5  Back pain in the middle of back that radiates to the upper thigh, but when walking can feel pain in right calf

## 2023-01-21 NOTE — Progress Notes (Signed)
Erin Cunningham - 82 y.o. female MRN [id number]  Date of birth: August 31, 1941  Office Visit Note: Visit Date: 01/21/2023 PCP: Lauree Chandler, NP Referred by: Lauree Chandler, NP  Subjective: Chief Complaint  Patient presents with   Lower Back - Pain   HPI: Erin Cunningham is a 82 y.o. female who comes in today for evaluation of chronic, worsening and severe right sided lower back pain radiating to buttock down lateral leg to foot. Pain ongoing for several years and worsens with prolonged standing and walking. Patient is Control and instrumentation engineer, she requires frequent breaks with walking due to severe pain. States she leans over when walking to alleviate pain. She describes pain as sore and aching sensation, currently rate as 8 out of 10. Some relief of pain with home exercise regimen, rest and use of medications. Lumbar MRI imaging from 2021 exhibits severe multifactorial spinal stenosis, advanced facet arthropathy and anterolisthesis of 7 mm at L4-L5. History of right L4 transforaminal epidural steroid injection performed in our office in 2021.  Patient reports injection procedure was painful for her, however injection did provide her greater than 80% relief of pain for over a year.  States her pain has gradually worsened over the last couple months and is negatively impacting her functional ability.  Per Dr. Romona Curls last office visit note in 2021, he did discuss possible surgical consultation with patient, however she was not interested in moving forward with surgical intervention at that time.  Patient denies focal weakness, numbness and tingling.  No recent trauma or falls.   Review of Systems  Musculoskeletal:  Positive for back pain.  Neurological:  Negative for tingling, sensory change, focal weakness and weakness.  All other systems reviewed and are negative.  Otherwise per HPI.  Assessment & Plan: Visit Diagnoses:    ICD-10-CM   1. Lumbar radiculopathy  M54.16 Ambulatory referral to  Physical Medicine Rehab    2. Spinal stenosis of lumbar region with neurogenic claudication  M48.062 Ambulatory referral to Physical Medicine Rehab    3. Facet arthropathy, lumbar  M47.816 Ambulatory referral to Physical Medicine Rehab       Plan: Findings:  Chronic, worsening and severe right sided lower back pain radiating to buttock down lateral leg to foot. Patient continues to have severe pain despite good conservative therapies such as home exercise regimen, rest and use of medications. Significant and sustained relief of pain with transforaminal injection in 2021 until recently. Next step is to repeat right L4 transforaminal epidural steroid injection under fluoroscopic guidance. If good relief of pain with injection we can repeat this procedure infrequently as needed. I also discussed other treatments such as physical therapy/chiropractic treatments and medications. I did offer surgical consultation with with Dr. Ileene Rubens in our office to discuss options. She seems open to surgical consult but would like to proceed with injection first. I did encourage patient to remain active as tolerated. No red flag symptoms noted upon exam today.     Meds & Orders:  Meds ordered this encounter  Medications   diazepam (VALIUM) 5 MG tablet    Sig: Take one tablet by mouth with food one hour prior to procedure. May repeat 30 minutes prior if needed.    Dispense:  2 tablet    Refill:  0   traMADol (ULTRAM) 50 MG tablet    Sig: Take 1 tablet (50 mg total) by mouth every 8 (eight) hours.    Dispense:  20 tablet  Refill:  0    Orders Placed This Encounter  Procedures   Ambulatory referral to Physical Medicine Rehab    Follow-up: Return for Right L4 transforaminal epidural steroid injection.   Procedures: No procedures performed      Clinical History: EXAM: MRI LUMBAR SPINE WITHOUT CONTRAST   TECHNIQUE: Multiplanar, multisequence MR imaging of the lumbar spine was performed. No  intravenous contrast was administered.   COMPARISON:  Radiography 07/23/2020   FINDINGS: Segmentation:  5 lumbar type vertebral bodies.   Alignment:  7 mm anterolisthesis L4-5. 2 mm anterolisthesis L3-4.   Vertebrae:  No fracture or primary bone lesion.   Conus medullaris and cauda equina: Conus extends to the L1-2 level. Conus and cauda equina appear normal.   Paraspinal and other soft tissues: Negative   Disc levels:   No significant finding at L2-3 or above. Minimal age related desiccation of the discs. No stenosis.   L3-4: Mild bulging of the disc. Facet degeneration and hypertrophy. 2 mm of degenerative anterolisthesis. Mild canal narrowing but without visible neural compression.   L4-5: Facet arthropathy with 7 mm of anterolisthesis. Bulging of the disc. Severe stenosis at this level that could cause neural compression on either or both sides. Bilateral foraminal narrowing as well.   L5-S1: Mild bulging of the disc. Mild facet and ligamentous hypertrophy. Mild narrowing of the subarticular lateral recesses but without visible neural compression.   IMPRESSION: L4-5: Severe multifactorial spinal stenosis. Advanced facet arthropathy with facet and ligamentous hypertrophy allowing anterolisthesis of 7 mm. Bulging of the disc. Bilateral foraminal narrowing. Neural compression could occur on either or both sides.   L3-4: Facet degeneration and hypertrophy. 2 mm of anterolisthesis. Bulging of the disc. Mild canal narrowing without visible neural compression.   L5-S1: Mild disc bulge and facet hypertrophy. No compressive stenosis.     Electronically Signed   By: Nelson Chimes M.D.   On: 09/15/2020 05:42   She reports that she has never smoked. Her smokeless tobacco use includes chew. No results for input(s): "HGBA1C", "LABURIC" in the last 8760 hours.  Objective:  VS:  HT:    WT:   BMI:     BP:   HR: bpm  TEMP: ( )  RESP:  Physical Exam Vitals and nursing  note reviewed.  HENT:     Head: Normocephalic and atraumatic.     Right Ear: External ear normal.     Left Ear: External ear normal.     Nose: Nose normal.     Mouth/Throat:     Mouth: Mucous membranes are moist.  Eyes:     Extraocular Movements: Extraocular movements intact.  Cardiovascular:     Rate and Rhythm: Normal rate.     Pulses: Normal pulses.  Pulmonary:     Effort: Pulmonary effort is normal.  Abdominal:     General: Abdomen is flat. There is no distension.  Musculoskeletal:        General: Tenderness present.     Cervical back: Normal range of motion.     Comments: Erin Cunningham rises from seated position to standing without difficulty. Good lumbar range of motion. Strong distal strength without clonus, no pain upon palpation of greater trochanters. Sensation intact bilaterally. Walks independently, gait steady.   Skin:    General: Skin is warm and dry.     Capillary Refill: Capillary refill takes less than 2 seconds.  Neurological:     General: No focal deficit present.     Mental Status: She is  alert and oriented to person, place, and time.  Psychiatric:        Mood and Affect: Mood normal.        Behavior: Behavior normal.     Ortho Exam  Imaging: No results found.  Past Medical/Family/Surgical/Social History: Medications & Allergies reviewed per EMR, new medications updated. Patient Active Problem List   Diagnosis Date Noted   Aortic atherosclerosis ([place]) 03/06/2022   Hyperlipidemia LDL goal <70 03/05/2022   LVH (left ventricular hypertrophy) 11/17/2021   History of CVA (cerebrovascular accident) 11/17/2021   Paroxysmal atrial fibrillation ([place]) 11/17/2021   Infarction of left basal ganglia ([place]) 05/29/2020   Hypertrophic cardiomyopathy ([place]) 05/29/2020   Anxiety and depression 05/29/2020   Weakness of right lower extremity 05/28/2020   Allergic rhinitis 06/16/2018   Chewing tobacco nicotine dependence with nicotine-induced disorder 06/16/2018   History of  adenomatous polyp of colon 09/23/2017   Hypercalcemia 04/20/2016   Neck nodule 04/20/2016   Essential hypertension 07/21/2015   Abnormal fasting glucose 07/21/2015   History of breast cancer in female 07/21/2015   Major depressive disorder, recurrent, severe without psychotic features ([place]) 06/24/2015   MDD (major depressive disorder), recurrent episode, severe ([place]) 06/24/2015   Onychomycosis 06/12/2014   Pain in lower limb 06/12/2014   Past Medical History:  Diagnosis Date   Breast CA ([place]) 2006   right ([place])   Cancer ([place])    Helicobacter pylori gastritis 2001   High blood pressure    History of adenomatous polyp of colon 09/23/2017   [place] 09/25/2017 and apparently polyps in the past as well though recalled age   History of shingles    Major depressive disorder 05/28/2020   Major depressive disorder, recurrent, severe without psychotic features ([place]) 06/24/2015   Parotid mass 2014   right benign cyst   Personal history of radiation therapy 2006   Rt breast   Stroke (cerebrum) ([place]) 06/28/2020   Per patient   Wears glasses    Family History  Problem Relation Age of Onset   Heart disease Mother        MI   Cancer Father        bone   Thyroid disease Neg Hx    Hypercalcemia Neg Hx    Colon cancer Neg Hx    Stomach cancer Neg Hx    Rectal cancer Neg Hx    Esophageal cancer Neg Hx    Past Surgical History:  Procedure Laterality Date   ABDOMINAL HYSTERECTOMY  1982   BSO   BREAST LUMPECTOMY Right 2007   BREAST LUMPECTOMY WITH SENTINEL LYMPH NODE BIOPSY  2005   right   BREAST SURGERY  2004   rt br mass   CESAREAN SECTION     COLONOSCOPY     ESOPHAGOGASTRODUODENOSCOPY  2001   H pylori gastritis   EYE SURGERY     both cataracts   PAROTIDECTOMY Right 09/05/2013   Procedure: RIGHT SUPERFICIAL PAROTIDECTOMY WITH FACIAL NERVE DISECTION;  Surgeon: [name], MD;  Location: [facility];  Service: ENT;  Laterality: Right;  All documentation done  by R.Ward after [phone] --done under G. Garzon's password   RE-EXCISION OF BREAST LUMPECTOMY  2005   right   Social History   Occupational History   Occupation: Middle school Consulting civil engineer    Employer: [employer]  Tobacco Use   Smoking status: Never   Smokeless tobacco: Current    Types: Chew   Tobacco comments:    Uses chew  as needed   Vaping Use   Vaping Use: Never used  Substance and Sexual Activity   Alcohol use: Yes    Comment: Social maybe 2 x yearly   Drug use: No   Sexual activity: Not Currently

## 2023-01-25 ENCOUNTER — Telehealth (HOSPITAL_COMMUNITY): Payer: BC Managed Care – PPO | Admitting: Psychiatry

## 2023-01-27 ENCOUNTER — Telehealth (HOSPITAL_BASED_OUTPATIENT_CLINIC_OR_DEPARTMENT_OTHER): Payer: BC Managed Care – PPO | Admitting: Psychiatry

## 2023-01-27 ENCOUNTER — Encounter (HOSPITAL_COMMUNITY): Payer: Self-pay | Admitting: Psychiatry

## 2023-01-27 VITALS — Wt 161.0 lb

## 2023-01-27 DIAGNOSIS — F332 Major depressive disorder, recurrent severe without psychotic features: Secondary | ICD-10-CM

## 2023-01-27 DIAGNOSIS — F419 Anxiety disorder, unspecified: Secondary | ICD-10-CM

## 2023-01-27 MED ORDER — SERTRALINE HCL 100 MG PO TABS: 100.0000 mg | ORAL_TABLET | Freq: Every day | ORAL | 0 refills | Status: DC

## 2023-01-27 NOTE — Progress Notes (Signed)
[facility] MD Virtual Progress Note   Patient Location: Work Provider Location: Home Office  I connect with patient by telephone and verified that I am speaking with correct person by using two identifiers. I discussed the limitations of evaluation and management by telemedicine and the availability of in person appointments. I also discussed with the patient that there may be a patient responsible charge related to this service. The patient expressed understanding and agreed to proceed.  Erin Cunningham ID:[id number] 82 y.o.  01/27/2023 4:01 PM    History of Present Illness:  Patient is evaluated by phone session.  She is at work.  She reported job is going well as keeping her busy.  She is working as an English as a second language teacher.  Patient admitted some stress coming from her husband when he called her names but she learned how to handle those situations.  She tried to avoid and walk away.  Patient also reported that his grandson did not have a car and she gave him her husband's car but he was involved in an accident and car was totaled.  Patient told the sad thing is that grandson did not informed until the insurance people came and her husband has a lot of grudges about his car.  Overall patient describes things are going well.  She is sleeping better and does not require trazodone.  She liked Zoloft which she takes every day and help her anxiety and depression.  She denies any crying spells or any feeling of hopelessness or worthlessness.  She reported lately having back pain and may required injection but recently given Valium to get the MRI.  Patient denies any suicidal thoughts.  She has no issues with the medication.  Past Psychiatric History: Reviewed H/O depression and anxiety.  One inpatient at [facility] for suicidal thoughts.  H/O brief stay in OBS unit in 2016.  No h/o mania, psychosis, hallucination or suicidal attempt.    Outpatient Encounter Medications as of 01/27/2023   Medication Sig   acetaminophen (TYLENOL) 500 MG tablet Take 2 tablets (1,000 mg total) by mouth every 6 (six) hours as needed.   atorvastatin (LIPITOR) 80 MG tablet Take 1 tablet (80 mg total) by mouth daily.   benzonatate (TESSALON) 100 MG capsule Take 1 capsule (100 mg total) by mouth 2 (two) times daily as needed for cough.   carvedilol (COREG) 6.25 MG tablet Take 1 tablet (6.25 mg total) by mouth 2 (two) times daily with a meal.   diazepam (VALIUM) 5 MG tablet Take one tablet by mouth with food one hour prior to procedure. May repeat 30 minutes prior if needed.   fluticasone (FLONASE) 50 MCG/ACT nasal spray Place 2 sprays into both nostrils daily.   hydrALAZINE (APRESOLINE) 25 MG tablet Take 1 tablet (25 mg total) by mouth 3 (three) times daily.   loratadine (CLARITIN) 10 MG tablet Take 1 tablet (10 mg total) by mouth daily.   losartan (COZAAR) 100 MG tablet Take 1 tablet (100 mg total) by mouth daily.   methocarbamol (ROBAXIN) 500 MG tablet Take 1 tablet (500 mg total) by mouth 2 (two) times daily as needed for muscle spasms.   predniSONE (STERAPRED UNI-PAK 21 TAB) 10 MG (21) TBPK tablet Take as directed   rivaroxaban (XARELTO) 20 MG TABS tablet Take 1 tablet (20 mg total) by mouth daily with supper.   sertraline (ZOLOFT) 100 MG tablet Take 1 tablet (100 mg total) by mouth daily.   traMADol (ULTRAM) 50 MG tablet Take  1 tablet (50 mg total) by mouth every 8 (eight) hours.   traZODone (DESYREL) 50 MG tablet Take 1 tablet (50 mg total) by mouth at bedtime as needed for sleep.   No facility-administered encounter medications on file as of 01/27/2023.    Recent Results (from the past 2160 hour(s))  POC COVID-19     Status: Abnormal   Collection Time: 12/23/22  9:54 AM  Result Value Ref Range   SARS Coronavirus 2 Ag Positive (A) Negative  POC Influenza A/B     Status: Normal   Collection Time: 12/23/22  9:54 AM  Result Value Ref Range   Influenza A, POC Negative Negative   Influenza B,  POC Negative Negative  POC Rapid Strep A     Status: Normal   Collection Time: 12/23/22 10:07 AM  Result Value Ref Range   Rapid Strep A Screen Negative Negative     Psychiatric Specialty Exam: Physical Exam  Review of Systems  Musculoskeletal:  Positive for back pain.    Weight 161 lb (73 kg).There is no height or weight on file to calculate BMI.  General Appearance: NA  Eye Contact:  NA  Speech:  Normal Rate  Volume:  Normal  Mood:  Anxious  Affect:  NA  Thought Process:  Goal Directed  Orientation:  Full (Time, Place, and Person)  Thought Content:  Logical  Suicidal Thoughts:  No  Homicidal Thoughts:  No  Memory:  Immediate;   Good Recent;   Good Remote;   Good  Judgement:  Intact  Insight:  Good  Psychomotor Activity:  NA  Concentration:  Concentration: Good and Attention Span: Good  Recall:  Good  Fund of Knowledge:  Good  Language:  Good  Akathisia:  No  Handed:  Right  AIMS (if indicated):     Assets:  Communication Skills Desire for Improvement Housing Transportation  ADL's:  Intact  Cognition:  WNL  Sleep:  ok     Assessment/Plan: Major depressive disorder, recurrent severe without psychotic features (HCC) - Plan: sertraline (ZOLOFT) 100 MG tablet  Anxiety - Plan: sertraline (ZOLOFT) 100 MG tablet  Patient is stable on her current medication.  Occasionally she gets upset when her husband called her names but she is trying to avoid those situations.  She does not need trazodone her sleep is better.  She is hoping to have an MRI soon so she can get definite diagnoses of her back pain.  She like to continue Zoloft 100 mg daily.  Discussed medication side effects and benefits.  Recommend to call us back if any question or any concern.  Follow-up open 3 months.   Follow Up Instructions:     I discussed the assessment and treatment plan with the patient. The patient was provided an opportunity to ask questions and all were answered. The patient agreed with  the plan and demonstrated an understanding of the instructions.   The patient was advised to call back or seek an in-person evaluation if the symptoms worsen or if the condition fails to improve as anticipated.    Collaboration of Care: Other provider involved in patient's care AEB notes are available in epic to review.  Patient/Guardian was advised Release of Information must be obtained prior to any record release in order to collaborate their care with an outside provider. Patient/Guardian was advised if they have not already done so to contact the registration department to sign all necessary forms in order for [place] to release information regarding their  care.   Consent: Patient/Guardian gives verbal consent for treatment and assignment of benefits for services provided during this visit. Patient/Guardian expressed understanding and agreed to proceed.     I provided 17 minutes of non face to face time during this encounter.  [name], MD 01/27/2023

## 2023-01-29 ENCOUNTER — Other Ambulatory Visit: Payer: Self-pay

## 2023-01-29 ENCOUNTER — Emergency Department (HOSPITAL_COMMUNITY): Payer: BC Managed Care – PPO

## 2023-01-29 ENCOUNTER — Inpatient Hospital Stay (HOSPITAL_COMMUNITY)
Admission: EM | Admit: 2023-01-29 | Discharge: 2023-02-02 | DRG: 305 | Disposition: A | Discharge status: Home / Self Care | Payer: BC Managed Care – PPO | Attending: Student in an Organized Health Care Education/Training Program | Admitting: Student in an Organized Health Care Education/Training Program

## 2023-01-29 ENCOUNTER — Encounter (HOSPITAL_COMMUNITY): Payer: Self-pay

## 2023-01-29 DIAGNOSIS — Z90710 Acquired absence of both cervix and uterus: Secondary | ICD-10-CM

## 2023-01-29 DIAGNOSIS — F17229 Nicotine dependence, chewing tobacco, with unspecified nicotine-induced disorders: Secondary | ICD-10-CM | POA: Diagnosis present

## 2023-01-29 DIAGNOSIS — F17220 Nicotine dependence, chewing tobacco, uncomplicated: Secondary | ICD-10-CM | POA: Diagnosis present

## 2023-01-29 DIAGNOSIS — Z86010 Personal history of colonic polyps: Secondary | ICD-10-CM

## 2023-01-29 DIAGNOSIS — M7989 Other specified soft tissue disorders: Secondary | ICD-10-CM | POA: Diagnosis not present

## 2023-01-29 DIAGNOSIS — I160 Hypertensive urgency: Secondary | ICD-10-CM | POA: Diagnosis not present

## 2023-01-29 DIAGNOSIS — Z853 Personal history of malignant neoplasm of breast: Secondary | ICD-10-CM | POA: Diagnosis not present

## 2023-01-29 DIAGNOSIS — Z7901 Long term (current) use of anticoagulants: Secondary | ICD-10-CM

## 2023-01-29 DIAGNOSIS — Z8619 Personal history of other infectious and parasitic diseases: Secondary | ICD-10-CM

## 2023-01-29 DIAGNOSIS — I10 Essential (primary) hypertension: Secondary | ICD-10-CM | POA: Diagnosis present

## 2023-01-29 DIAGNOSIS — H532 Diplopia: Secondary | ICD-10-CM | POA: Diagnosis present

## 2023-01-29 DIAGNOSIS — Z8673 Personal history of transient ischemic attack (TIA), and cerebral infarction without residual deficits: Secondary | ICD-10-CM | POA: Diagnosis not present

## 2023-01-29 DIAGNOSIS — R4701 Aphasia: Secondary | ICD-10-CM | POA: Diagnosis present

## 2023-01-29 DIAGNOSIS — Z923 Personal history of irradiation: Secondary | ICD-10-CM | POA: Diagnosis not present

## 2023-01-29 DIAGNOSIS — I161 Hypertensive emergency: Principal | ICD-10-CM | POA: Diagnosis present

## 2023-01-29 DIAGNOSIS — E663 Overweight: Secondary | ICD-10-CM | POA: Diagnosis present

## 2023-01-29 DIAGNOSIS — I422 Other hypertrophic cardiomyopathy: Secondary | ICD-10-CM | POA: Diagnosis present

## 2023-01-29 DIAGNOSIS — F32A Depression, unspecified: Secondary | ICD-10-CM | POA: Diagnosis present

## 2023-01-29 DIAGNOSIS — M722 Plantar fascial fibromatosis: Secondary | ICD-10-CM | POA: Diagnosis present

## 2023-01-29 DIAGNOSIS — F329 Major depressive disorder, single episode, unspecified: Secondary | ICD-10-CM | POA: Diagnosis present

## 2023-01-29 DIAGNOSIS — F324 Major depressive disorder, single episode, in partial remission: Secondary | ICD-10-CM

## 2023-01-29 DIAGNOSIS — R519 Headache, unspecified: Secondary | ICD-10-CM | POA: Diagnosis present

## 2023-01-29 DIAGNOSIS — I480 Paroxysmal atrial fibrillation: Secondary | ICD-10-CM | POA: Diagnosis present

## 2023-01-29 DIAGNOSIS — Z6828 Body mass index (BMI) 28.0-28.9, adult: Secondary | ICD-10-CM

## 2023-01-29 DIAGNOSIS — Z8249 Family history of ischemic heart disease and other diseases of the circulatory system: Secondary | ICD-10-CM | POA: Diagnosis not present

## 2023-01-29 DIAGNOSIS — F419 Anxiety disorder, unspecified: Secondary | ICD-10-CM | POA: Diagnosis present

## 2023-01-29 DIAGNOSIS — E785 Hyperlipidemia, unspecified: Secondary | ICD-10-CM | POA: Diagnosis present

## 2023-01-29 HISTORY — DX: Unspecified atrial fibrillation: I48.91

## 2023-01-29 LAB — CBC
HCT: 41.1 % (ref 36.0–46.0)
Hemoglobin: 13.4 g/dL (ref 12.0–15.0)
MCH: 29.1 pg (ref 26.0–34.0)
MCHC: 32.6 g/dL (ref 30.0–36.0)
MCV: 89.3 fL (ref 80.0–100.0)
Platelets: 296 10*3/uL (ref 150–400)
RBC: 4.6 MIL/uL (ref 3.87–5.11)
RDW: 13.5 % (ref 11.5–15.5)
WBC: 6.1 10*3/uL (ref 4.0–10.5)
nRBC: 0 % (ref 0.0–0.2)

## 2023-01-29 LAB — DIFFERENTIAL
Abs Immature Granulocytes: 0.01 10*3/uL (ref 0.00–0.07)
Basophils Absolute: 0.1 10*3/uL (ref 0.0–0.1)
Basophils Relative: 2 %
Eosinophils Absolute: 0.3 10*3/uL (ref 0.0–0.5)
Eosinophils Relative: 4 %
Immature Granulocytes: 0 %
Lymphocytes Relative: 34 %
Lymphs Abs: 2 10*3/uL (ref 0.7–4.0)
Monocytes Absolute: 0.5 10*3/uL (ref 0.1–1.0)
Monocytes Relative: 9 %
Neutro Abs: 3.2 10*3/uL (ref 1.7–7.7)
Neutrophils Relative %: 51 %

## 2023-01-29 LAB — I-STAT CHEM 8, ED
BUN: 18 mg/dL (ref 8–23)
Calcium, Ion: 1.35 mmol/L (ref 1.15–1.40)
Chloride: 105 mmol/L (ref 98–111)
Creatinine, Ser: 0.9 mg/dL (ref 0.44–1.00)
Glucose, Bld: 84 mg/dL (ref 70–99)
HCT: 41 % (ref 36.0–46.0)
Hemoglobin: 13.9 g/dL (ref 12.0–15.0)
Potassium: 4.5 mmol/L (ref 3.5–5.1)
Sodium: 140 mmol/L (ref 135–145)
TCO2: 26 mmol/L (ref 22–32)

## 2023-01-29 LAB — COMPREHENSIVE METABOLIC PANEL
ALT: 10 U/L (ref 0–44)
AST: 16 U/L (ref 15–41)
Albumin: 3.7 g/dL (ref 3.5–5.0)
Alkaline Phosphatase: 111 U/L (ref 38–126)
Anion gap: 8 (ref 5–15)
BUN: 15 mg/dL (ref 8–23)
CO2: 25 mmol/L (ref 22–32)
Calcium: 10.6 mg/dL — ABNORMAL HIGH (ref 8.9–10.3)
Chloride: 104 mmol/L (ref 98–111)
Creatinine, Ser: 0.96 mg/dL (ref 0.44–1.00)
GFR, Estimated: 59 mL/min — ABNORMAL LOW (ref >60)
Glucose, Bld: 86 mg/dL (ref 70–99)
Potassium: 4.1 mmol/L (ref 3.5–5.1)
Sodium: 137 mmol/L (ref 135–145)
Total Bilirubin: 0.9 mg/dL (ref 0.3–1.2)
Total Protein: 6.7 g/dL (ref 6.5–8.1)

## 2023-01-29 LAB — PROTIME-INR
INR: 1 (ref 0.8–1.2)
Prothrombin Time: 13.3 seconds (ref 11.4–15.2)

## 2023-01-29 LAB — CBG MONITORING, ED: Glucose-Capillary: 82 mg/dL (ref 70–99)

## 2023-01-29 LAB — ETHANOL: Alcohol, Ethyl (B): <10 mg/dL (ref <10)

## 2023-01-29 LAB — APTT: aPTT: 36 seconds (ref 24–36)

## 2023-01-29 MED ORDER — ATORVASTATIN CALCIUM 80 MG PO TABS
80.0000 mg | ORAL_TABLET | Freq: Every day | ORAL | Status: DC
  Administered 2023-01-30 – 2023-02-02 (×4): 80 mg via ORAL
  Filled 2023-01-29 (×4): qty 1

## 2023-01-29 MED ORDER — CLEVIDIPINE BUTYRATE 0.5 MG/ML IV EMUL
0.0000 mg/h | INTRAVENOUS | Status: DC
  Administered 2023-01-29 – 2023-01-30 (×2): 2 mg/h via INTRAVENOUS
  Administered 2023-01-31: 4 mg/h via INTRAVENOUS
  Filled 2023-01-29: qty 50
  Filled 2023-01-29: qty 100
  Filled 2023-01-29 (×2): qty 50

## 2023-01-29 MED ORDER — ACETAMINOPHEN 500 MG PO TABS
1000.0000 mg | ORAL_TABLET | Freq: Once | ORAL | Status: AC
  Administered 2023-01-29: 1000 mg via ORAL
  Filled 2023-01-29: qty 2

## 2023-01-29 MED ORDER — SERTRALINE HCL 100 MG PO TABS
100.0000 mg | ORAL_TABLET | Freq: Every day | ORAL | Status: DC
  Administered 2023-01-30 – 2023-02-02 (×5): 100 mg via ORAL
  Filled 2023-01-29 (×2): qty 1
  Filled 2023-01-29 (×2): qty 2
  Filled 2023-01-29: qty 1

## 2023-01-29 MED ORDER — SODIUM CHLORIDE 0.9% FLUSH
3.0000 mL | Freq: Once | INTRAVENOUS | Status: AC
  Administered 2023-01-29: 3 mL via INTRAVENOUS

## 2023-01-29 MED ORDER — CARVEDILOL 6.25 MG PO TABS
6.2500 mg | ORAL_TABLET | Freq: Two times a day (BID) | ORAL | Status: DC
  Administered 2023-01-30 – 2023-01-31 (×4): 6.25 mg via ORAL
  Filled 2023-01-29: qty 1
  Filled 2023-01-29 (×3): qty 2

## 2023-01-29 MED ORDER — HYDRALAZINE HCL 25 MG PO TABS: 25.0000 mg | ORAL_TABLET | Freq: Three times a day (TID) | ORAL | Status: DC

## 2023-01-29 MED ORDER — HYDROMORPHONE HCL 1 MG/ML IJ SOLN
0.5000 mg | Freq: Once | INTRAMUSCULAR | Status: AC
  Administered 2023-01-30: 0.5 mg via INTRAVENOUS
  Filled 2023-01-29: qty 1

## 2023-01-29 MED ORDER — LOSARTAN POTASSIUM 50 MG PO TABS: 100.0000 mg | ORAL_TABLET | Freq: Every day | ORAL | Status: DC

## 2023-01-29 NOTE — ED Notes (Signed)
Patient transported to CT 

## 2023-01-29 NOTE — ED Triage Notes (Signed)
PER EMS: pt sent from Urgent Care with c/o aphasia that started at 1pm and lasted 3 hours. She went to Urgent Care around 1620 with c/o headache, generalized weakness and HTN. She is A&OX4, speaking clear complete sentences. She was sent from urgent care with concern for stroke.   BP-220/110, HR-84, O2-100%

## 2023-01-29 NOTE — ED Provider Notes (Signed)
[facility] Provider Note   CSN: [id number] Arrival date & time: 01/29/23  [id number]     History  Chief Complaint  Patient presents with   Headache    Erin Cunningham is a 82 y.o. female.  82 year old female with prior medical history as detailed below presents for evaluation.  Patient reports that at around 1 PM this afternoon she developed left-sided headache.  At approximately 3:00 she developed difficulty with her word finding.  She reports that she had difficulty getting her speech to come out.  This difficulty with her speech lasted approximately 30 to 45 minutes.  By 430 her headache and her difficulty with speech had resolved completely.  Patient reports that she is not currently taking any of her previously prescribed antihypertensives.  She reports prior history of stroke.  She denies current headache, vision change, focal weakness on evaluation.  The history is provided by the patient and medical records.       Home Medications Prior to Admission medications   Medication Sig Start Date End Date Taking? Authorizing Provider  acetaminophen (TYLENOL) 500 MG tablet Take 2 tablets (1,000 mg total) by mouth every 6 (six) hours as needed. 08/24/20   Charlesetta Shanks, MD  atorvastatin (LIPITOR) 80 MG tablet Take 1 tablet (80 mg total) by mouth daily. 04/10/22   Lauree Chandler, NP  benzonatate (TESSALON) 100 MG capsule Take 1 capsule (100 mg total) by mouth 2 (two) times daily as needed for cough. 12/23/22   Ngetich, Dinah C, NP  carvedilol (COREG) 6.25 MG tablet Take 1 tablet (6.25 mg total) by mouth 2 (two) times daily with a meal. 04/21/21   Lauree Chandler, NP  diazepam (VALIUM) 5 MG tablet Take one tablet by mouth with food one hour prior to procedure. May repeat 30 minutes prior if needed. 01/21/23   Lorine Bears, NP  fluticasone (FLONASE) 50 MCG/ACT nasal spray Place 2 sprays into both nostrils daily. 12/08/22   Ngetich, Dinah  C, NP  hydrALAZINE (APRESOLINE) 25 MG tablet Take 1 tablet (25 mg total) by mouth 3 (three) times daily. 04/10/22   Lauree Chandler, NP  loratadine (CLARITIN) 10 MG tablet Take 1 tablet (10 mg total) by mouth daily. 12/08/22   Ngetich, Dinah C, NP  losartan (COZAAR) 100 MG tablet Take 1 tablet (100 mg total) by mouth daily. 04/20/22   Lauree Chandler, NP  methocarbamol (ROBAXIN) 500 MG tablet Take 1 tablet (500 mg total) by mouth 2 (two) times daily as needed for muscle spasms. 12/08/22   Aundra Dubin, PA-C  predniSONE (STERAPRED UNI-PAK 21 TAB) 10 MG (21) TBPK tablet Take as directed 12/08/22   Aundra Dubin, PA-C  rivaroxaban (XARELTO) 20 MG TABS tablet Take 1 tablet (20 mg total) by mouth daily with supper. 12/12/21   [name], MD  sertraline (ZOLOFT) 100 MG tablet Take 1 tablet (100 mg total) by mouth daily. 01/27/23   Arfeen, Arlyce Harman, MD  traMADol (ULTRAM) 50 MG tablet Take 1 tablet (50 mg total) by mouth every 8 (eight) hours. 01/21/23   Lorine Bears, NP  traZODone (DESYREL) 50 MG tablet Take 1 tablet (50 mg total) by mouth at bedtime as needed for sleep. Patient not taking: Reported on 01/27/2023 08/04/22   [name], MD      Allergies    Patient has no known allergies.    Review of Systems   Review of Systems  All  other systems reviewed and are negative.   Physical Exam Updated Vital Signs BP (!) 223/96   Pulse 80   Temp (!) 97.4 F (36.3 C) (Oral)   Resp 14   Ht '5\' 3"'$  (1.6 m)   Wt 72.6 kg   SpO2 97%   BMI 28.34 kg/m  Physical Exam Vitals and nursing note reviewed.  Constitutional:      General: She is not in acute distress.    Appearance: Normal appearance. She is well-developed.  HENT:     Head: Normocephalic and atraumatic.  Eyes:     Conjunctiva/sclera: Conjunctivae normal.     Pupils: Pupils are equal, round, and reactive to light.  Cardiovascular:     Rate and Rhythm: Normal rate and regular rhythm.     Heart sounds: Normal heart sounds.   Pulmonary:     Effort: Pulmonary effort is normal. No respiratory distress.     Breath sounds: Normal breath sounds.  Abdominal:     General: There is no distension.     Palpations: Abdomen is soft.     Tenderness: There is no abdominal tenderness.  Musculoskeletal:        General: No deformity. Normal range of motion.     Cervical back: Normal range of motion and neck supple.  Skin:    General: Skin is warm and dry.  Neurological:     General: No focal deficit present.     Mental Status: She is alert and oriented to person, place, and time.     GCS: GCS eye subscore is 4. GCS verbal subscore is 5. GCS motor subscore is 6.     Cranial Nerves: No cranial nerve deficit, dysarthria or facial asymmetry.     Sensory: No sensory deficit.     Motor: No weakness.     ED Results / Procedures / Treatments   Labs (all labs ordered are listed, but only abnormal results are displayed) Labs Reviewed  COMPREHENSIVE METABOLIC PANEL - Abnormal; Notable for the following components:      Result Value   Calcium 10.6 (*)    GFR, Estimated 59 (*)    All other components within normal limits  PROTIME-INR  APTT  CBC  DIFFERENTIAL  ETHANOL  I-STAT CHEM 8, ED  CBG MONITORING, ED    EKG EKG Interpretation  Date/Time:  Friday January 29 2023 19:03:25 EST Ventricular Rate:  66 PR Interval:  154 QRS Duration: 81 QT Interval:  408 QTC Calculation: 428 R Axis:   54 Text Interpretation: Sinus rhythm Probable left atrial enlargement Minimal ST elevation, inferior leads Confirmed by Dene Gentry [phone] on 01/29/2023 8:54:32 PM  Radiology CT HEAD WO CONTRAST  Result Date: 01/29/2023 CLINICAL DATA:  Aphasia EXAM: CT HEAD WITHOUT CONTRAST TECHNIQUE: Contiguous axial images were obtained from the base of the skull through the vertex without intravenous contrast. RADIATION DOSE REDUCTION: This exam was performed according to the departmental dose-optimization program which includes automated  exposure control, adjustment of the mA and/or kV according to patient size and/or use of iterative reconstruction technique. COMPARISON:  None Available. FINDINGS: Brain: There is no mass, hemorrhage or extra-axial collection. There is generalized atrophy without lobar predilection. Hypodensity of the white matter is most commonly associated with chronic microvascular disease. Hyperdensity in the right frontal lobe consistent with cavernous angioma, unchanged. Vascular: No abnormal hyperdensity of the major intracranial arteries or dural venous sinuses. No intracranial atherosclerosis. Skull: The visualized skull base, calvarium and extracranial soft tissues are normal. Sinuses/Orbits: Bilateral  maxillary retention cysts. the orbits are normal. IMPRESSION: 1. No acute intracranial abnormality. 2. Generalized atrophy and findings of chronic microvascular disease. Electronically Signed   By: Ulyses Jarred M.D.   On: 01/29/2023 20:13    Procedures Procedures    Medications Ordered in ED Medications  sodium chloride flush (NS) 0.9 % injection 3 mL (3 mLs Intravenous Given 01/29/23 2012)    ED Course/ Medical Decision Making/ A&P                             Medical Decision Making Amount and/or Complexity of Data Reviewed Labs: ordered. Radiology: ordered.  Risk OTC drugs. Decision regarding hospitalization.    Medical Screen Complete  This patient presented to the ED with complaint of headache, aphasia.  This complaint involves an extensive number of treatment options. The initial differential diagnosis includes, but is not limited to, TIA, CVA, hypertensive emergency, metabolic abnormality, etc.  This presentation is: Acute, Self-Limited, Previously Undiagnosed, Uncertain Prognosis, Complicated, Systemic Symptoms, and Threat to Life/Bodily Function  Patient is presenting with complaint of transient headache and word finding difficulty.  At time of evaluation patient's symptoms are  largely resolved.  Patient appears to be in neurologically intact in the ED.  Patient is seen to be significantly hypertensive.  Case discussed briefly with neurology -Dr. Cheral Marker -who feels that patient's presentation is most consistent with likely hypertensive emergency.  Patient started on Cleviprex drip for treatment of hypertension.  MRI pending at time of admission.  Dr. Cheral Marker asked for official consultation if positive findings on MRI.  Hospitalist service is aware of case and will evaluate for admission.   Additional history obtained:  External records from outside sources obtained and reviewed including prior ED visits and prior Inpatient records.    Lab Tests:  I ordered and personally interpreted labs.  The pertinent results include: CBC, CMP, i-STAT Chem-8, INR, EtOH   Imaging Studies ordered:  I ordered imaging studies including CT head I independently visualized and interpreted obtained imaging which showed NAD I agree with the radiologist interpretation.   Cardiac Monitoring:  The patient was maintained on a cardiac monitor.  I personally viewed and interpreted the cardiac monitor which showed an underlying rhythm of: NSR   Medicines ordered:  I ordered medication including Tylenol, Cleviprex drip for headache, hypertension Reevaluation of the patient after these medicines showed that the patient: improved   Problem List / ED Course:  Headache, transient altered speech, hypertensive emergency   Reevaluation:  After the interventions noted above, I reevaluated the patient and found that they have: improved   Disposition:  After consideration of the diagnostic results and the patients response to treatment, I feel that the patent would benefit from admission.    CRITICAL CARE Performed by: Valarie Merino   Total critical care time: 30 minutes  Critical care time was exclusive of separately billable procedures and treating other  patients.  Critical care was necessary to treat or prevent imminent or life-threatening deterioration.  Critical care was time spent personally by me on the following activities: development of treatment plan with patient and/or surrogate as well as nursing, discussions with consultants, evaluation of patient's response to treatment, examination of patient, obtaining history from patient or surrogate, ordering and performing treatments and interventions, ordering and review of laboratory studies, ordering and review of radiographic studies, pulse oximetry and re-evaluation of patient's condition.         Final  Clinical Impression(s) / ED Diagnoses Final diagnoses:  Hypertensive emergency    Rx / DC Orders ED Discharge Orders     None         Valarie Merino, MD 01/29/23 2318

## 2023-01-29 NOTE — H&P (Signed)
PCP:   Lauree Chandler, NP   Chief Complaint:  Headache  HPI: This is a 82 year old female with past medical history of MDD, HTN, history of CVA and history of breast cancer in remission.  She stopped taking her medications approximately 2 months ago.  She states she just got tired of all the medications.  She has been doing well.  She saw her PCP 2 weeks ago and her blood pressure at that time was okay.  Today around 1 PM she developed a throbbing severe headache that was persistent.  For 30 minutes while at work she had difficulty getting words to come out as she wanted.  She does not believe anyone noted this.  She denies associated nausea, vomiting or localized weakness.  Her headache resolved around 4:30 PM.  After work she decided to come to the ER.  In the ER her blood pressure was as high as 261/101.  CT head was without any acute intracranial abnormality.  She was started on a Cleviprex gtt. Neuro was contacted recommended admission, treated as hypertensive emergency.  Patient initially had no headache, during my interview her headache had rebounded.  She had no neurological deficits.  Review of Systems:  The patient denies anorexia, fever, weight loss, vision loss, decreased hearing, hoarseness, chest pain, syncope, dyspnea on exertion, peripheral edema, balance deficits, hemoptysis, abdominal pain, melena, hematochezia, severe indigestion/heartburn, hematuria, incontinence, genital sores, muscle weakness, suspicious skin lesions, transient blindness, difficulty walking, depression, unusual weight change, abnormal bleeding, enlarged lymph nodes, angioedema, and breast masses. Positives: Headache, word finding difficulty  Past Medical History: Past Medical History:  Diagnosis Date   Breast CA ([place]) 2006   right ([place])   Cancer ([place])    Helicobacter pylori gastritis 2001   High blood pressure    History of adenomatous polyp of colon 09/23/2017   [place]ma 09/25/2017 and apparently  polyps in the past as well though recalled age   History of shingles    Major depressive disorder 05/28/2020   Major depressive disorder, recurrent, severe without psychotic features ([place]) 06/24/2015   Parotid mass 2014   right benign cyst   Personal history of radiation therapy 2006   Rt breast   Stroke (cerebrum) ([place]) 06/28/2020   Per patient   Wears glasses    Past Surgical History:  Procedure Laterality Date   ABDOMINAL HYSTERECTOMY  1982   BSO   BREAST LUMPECTOMY Right 2007   BREAST LUMPECTOMY WITH SENTINEL LYMPH NODE BIOPSY  2005   right   BREAST SURGERY  2004   rt br mass   CESAREAN SECTION     COLONOSCOPY     ESOPHAGOGASTRODUODENOSCOPY  2001   H pylori gastritis   EYE SURGERY     both cataracts   PAROTIDECTOMY Right 09/05/2013   Procedure: RIGHT SUPERFICIAL PAROTIDECTOMY WITH FACIAL NERVE DISECTION;  Surgeon: [name], MD;  Location: [facility];  Service: ENT;  Laterality: Right;  All documentation done by R.Ward after [phone] --done under G. Garzon's password   RE-EXCISION OF BREAST LUMPECTOMY  2005   right    Medications: Prior to Admission medications   Medication Sig Start Date End Date Taking? Authorizing Provider  acetaminophen (TYLENOL) 500 MG tablet Take 2 tablets (1,000 mg total) by mouth every 6 (six) hours as needed. 08/24/20   Charlesetta Shanks, MD  atorvastatin (LIPITOR) 80 MG tablet Take 1 tablet (80 mg total) by mouth daily. 04/10/22   Lauree Chandler, NP  benzonatate (TESSALON) 100  MG capsule Take 1 capsule (100 mg total) by mouth 2 (two) times daily as needed for cough. 12/23/22   Ngetich, Dinah C, NP  carvedilol (COREG) 6.25 MG tablet Take 1 tablet (6.25 mg total) by mouth 2 (two) times daily with a meal. 04/21/21   Lauree Chandler, NP  diazepam (VALIUM) 5 MG tablet Take one tablet by mouth with food one hour prior to procedure. May repeat 30 minutes prior if needed. 01/21/23   Lorine Bears, NP  fluticasone (FLONASE) 50  MCG/ACT nasal spray Place 2 sprays into both nostrils daily. 12/08/22   Ngetich, Dinah C, NP  hydrALAZINE (APRESOLINE) 25 MG tablet Take 1 tablet (25 mg total) by mouth 3 (three) times daily. 04/10/22   Lauree Chandler, NP  loratadine (CLARITIN) 10 MG tablet Take 1 tablet (10 mg total) by mouth daily. 12/08/22   Ngetich, Dinah C, NP  losartan (COZAAR) 100 MG tablet Take 1 tablet (100 mg total) by mouth daily. 04/20/22   Lauree Chandler, NP  methocarbamol (ROBAXIN) 500 MG tablet Take 1 tablet (500 mg total) by mouth 2 (two) times daily as needed for muscle spasms. 12/08/22   Aundra Dubin, PA-C  predniSONE (STERAPRED UNI-PAK 21 TAB) 10 MG (21) TBPK tablet Take as directed 12/08/22   Aundra Dubin, PA-C  rivaroxaban (XARELTO) 20 MG TABS tablet Take 1 tablet (20 mg total) by mouth daily with supper. 12/12/21   [name], MD  sertraline (ZOLOFT) 100 MG tablet Take 1 tablet (100 mg total) by mouth daily. 01/27/23   Arfeen, Arlyce Harman, MD  traMADol (ULTRAM) 50 MG tablet Take 1 tablet (50 mg total) by mouth every 8 (eight) hours. 01/21/23   Lorine Bears, NP  traZODone (DESYREL) 50 MG tablet Take 1 tablet (50 mg total) by mouth at bedtime as needed for sleep. Patient not taking: Reported on 01/27/2023 08/04/22   [name], MD    Allergies:  No Known Allergies  Social History:  reports that she has never smoked. Her smokeless tobacco use includes chew. She reports current alcohol use. She reports that she does not use drugs.  Family History: Family History  Problem Relation Age of Onset   Heart disease Mother        MI   Cancer Father        bone   Thyroid disease Neg Hx    Hypercalcemia Neg Hx    Colon cancer Neg Hx    Stomach cancer Neg Hx    Rectal cancer Neg Hx    Esophageal cancer Neg Hx     Physical Exam: Vitals:   01/29/23 2115 01/29/23 2130 01/29/23 2145 01/29/23 2200  BP: (!) 223/96 (!) 240/90 (!) 261/101 (!) 233/102  Pulse: 80 73 61 75  Resp:  '20 17 17  '$ Temp:       TempSrc:      SpO2: 97% 98% 95% 98%  Weight:      Height:        General:  Alert and oriented times three, well developed and nourished, no acute distress Eyes: PERRLA, pink conjunctiva, no scleral icterus ENT: Moist oral mucosa, neck supple, no thyromegaly Lungs: clear to ascultation, no wheeze, no crackles, no use of accessory muscles Cardiovascular: regular rate and rhythm, no regurgitation, no gallops, no murmurs. No carotid bruits, no JVD Abdomen: soft, positive BS, non-tender, non-distended, no organomegaly, not an acute abdomen GU: not examined Neuro: CN II - XII grossly intact, sensation intact Musculoskeletal:  strength 5/5 all extremities, no clubbing, cyanosis or edema Skin: no rash, no subcutaneous crepitation, no decubitus Psych: appropriate patient   Labs on Admission:  Recent Labs    01/29/23 1921 01/29/23 1926  NA 137 140  K 4.1 4.5  CL 104 105  CO2 25  --   GLUCOSE 86 84  BUN 15 18  CREATININE 0.96 0.90  CALCIUM 10.6*  --    Recent Labs    01/29/23 1921  AST 16  ALT 10  ALKPHOS 111  BILITOT 0.9  PROT 6.7  ALBUMIN 3.7    Recent Labs    01/29/23 1921 01/29/23 1926  WBC 6.1  --   NEUTROABS 3.2  --   HGB 13.4 13.9  HCT 41.1 41.0  MCV 89.3  --   PLT 296  --     Radiological Exams on Admission: CT HEAD WO CONTRAST  Result Date: 01/29/2023 CLINICAL DATA:  Aphasia EXAM: CT HEAD WITHOUT CONTRAST TECHNIQUE: Contiguous axial images were obtained from the base of the skull through the vertex without intravenous contrast. RADIATION DOSE REDUCTION: This exam was performed according to the departmental dose-optimization program which includes automated exposure control, adjustment of the mA and/or kV according to patient size and/or use of iterative reconstruction technique. COMPARISON:  None Available. FINDINGS: Brain: There is no mass, hemorrhage or extra-axial collection. There is generalized atrophy without lobar predilection. Hypodensity of the  white matter is most commonly associated with chronic microvascular disease. Hyperdensity in the right frontal lobe consistent with cavernous angioma, unchanged. Vascular: No abnormal hyperdensity of the major intracranial arteries or dural venous sinuses. No intracranial atherosclerosis. Skull: The visualized skull base, calvarium and extracranial soft tissues are normal. Sinuses/Orbits: Bilateral maxillary retention cysts. the orbits are normal. IMPRESSION: 1. No acute intracranial abnormality. 2. Generalized atrophy and findings of chronic microvascular disease. Electronically Signed   By: Ulyses Jarred M.D.   On: 01/29/2023 20:13    Assessment/Plan Present on Admission:  Malignant hypertension/ Headache -Started on Cleviprex gtt. Goal SBP ~180.  Slow decline of blood pressure -Coreg resumed, first dose now.  Additional doses hydralazine and losartan in AM. -Intermittent neurochecks -PRN pain medication   MDD -Likely cause of patient's discontinue medications -Resume Zoloft, first dose now   Hyperlipidemia LDL goal <70 -Lipitor resumed   Paroxysmal atrial fibrillation (HCC) -Coreg and Xarelto resumed   H/o CVA -Atorvastatin, Coreg, Xarelto   h/o breast cancer   Samauri Kellenberger 01/29/2023, 11:02 PM

## 2023-01-30 ENCOUNTER — Inpatient Hospital Stay (HOSPITAL_COMMUNITY): Payer: BC Managed Care – PPO

## 2023-01-30 ENCOUNTER — Encounter (HOSPITAL_COMMUNITY): Payer: Self-pay | Admitting: Pulmonary Disease

## 2023-01-30 DIAGNOSIS — I161 Hypertensive emergency: Principal | ICD-10-CM | POA: Diagnosis present

## 2023-01-30 LAB — CBC WITH DIFFERENTIAL/PLATELET
Abs Immature Granulocytes: 0.02 10*3/uL (ref 0.00–0.07)
Basophils Absolute: 0.1 10*3/uL (ref 0.0–0.1)
Basophils Relative: 1 %
Eosinophils Absolute: 0.1 10*3/uL (ref 0.0–0.5)
Eosinophils Relative: 1 %
HCT: 41.8 % (ref 36.0–46.0)
Hemoglobin: 13.9 g/dL (ref 12.0–15.0)
Immature Granulocytes: 0 %
Lymphocytes Relative: 16 %
Lymphs Abs: 1.2 10*3/uL (ref 0.7–4.0)
MCH: 29.1 pg (ref 26.0–34.0)
MCHC: 33.3 g/dL (ref 30.0–36.0)
MCV: 87.4 fL (ref 80.0–100.0)
Monocytes Absolute: 0.6 10*3/uL (ref 0.1–1.0)
Monocytes Relative: 8 %
Neutro Abs: 5.6 10*3/uL (ref 1.7–7.7)
Neutrophils Relative %: 74 %
Platelets: 306 10*3/uL (ref 150–400)
RBC: 4.78 MIL/uL (ref 3.87–5.11)
RDW: 13.3 % (ref 11.5–15.5)
WBC: 7.6 10*3/uL (ref 4.0–10.5)
nRBC: 0 % (ref 0.0–0.2)

## 2023-01-30 LAB — BASIC METABOLIC PANEL
Anion gap: 10 (ref 5–15)
BUN: 11 mg/dL (ref 8–23)
CO2: 24 mmol/L (ref 22–32)
Calcium: 10.3 mg/dL (ref 8.9–10.3)
Chloride: 102 mmol/L (ref 98–111)
Creatinine, Ser: 0.92 mg/dL (ref 0.44–1.00)
GFR, Estimated: >60 mL/min (ref >60)
Glucose, Bld: 122 mg/dL — ABNORMAL HIGH (ref 70–99)
Potassium: 3.5 mmol/L (ref 3.5–5.1)
Sodium: 136 mmol/L (ref 135–145)

## 2023-01-30 LAB — MRSA NEXT GEN BY PCR, NASAL: MRSA by PCR Next Gen: NOT DETECTED

## 2023-01-30 MED ORDER — MORPHINE SULFATE (PF) 2 MG/ML IV SOLN
1.0000 mg | Freq: Once | INTRAVENOUS | Status: AC
  Administered 2023-01-30: 1 mg via INTRAVENOUS
  Filled 2023-01-30: qty 1

## 2023-01-30 MED ORDER — POLYETHYLENE GLYCOL 3350 17 G PO PACK: 17.0000 g | PACK | Freq: Every day | ORAL | Status: DC | PRN

## 2023-01-30 MED ORDER — ACETAMINOPHEN 325 MG PO TABS
650.0000 mg | ORAL_TABLET | Freq: Four times a day (QID) | ORAL | Status: DC | PRN
  Administered 2023-01-30 – 2023-02-02 (×5): 650 mg via ORAL
  Filled 2023-01-30 (×5): qty 2

## 2023-01-30 MED ORDER — SODIUM CHLORIDE 0.9 % IV SOLN: INTRAVENOUS | Status: DC

## 2023-01-30 MED ORDER — ONDANSETRON HCL 4 MG/2ML IJ SOLN
4.0000 mg | Freq: Four times a day (QID) | INTRAMUSCULAR | Status: DC | PRN
  Administered 2023-01-30: 4 mg via INTRAVENOUS
  Filled 2023-01-30: qty 2

## 2023-01-30 MED ORDER — ONDANSETRON HCL 4 MG PO TABS: 4.0000 mg | ORAL_TABLET | Freq: Four times a day (QID) | ORAL | Status: DC | PRN

## 2023-01-30 MED ORDER — CHLORHEXIDINE GLUCONATE CLOTH 2 % EX PADS
6.0000 | MEDICATED_PAD | Freq: Every day | CUTANEOUS | Status: DC
  Administered 2023-01-30 – 2023-02-02 (×3): 6 via TOPICAL

## 2023-01-30 MED ORDER — FLUTICASONE PROPIONATE 50 MCG/ACT NA SUSP
2.0000 | Freq: Every day | NASAL | Status: DC
  Administered 2023-01-30: 2 via NASAL
  Filled 2023-01-30: qty 16

## 2023-01-30 MED ORDER — ENOXAPARIN SODIUM 40 MG/0.4ML IJ SOSY: 40.0000 mg | PREFILLED_SYRINGE | Freq: Every day | INTRAMUSCULAR | Status: DC

## 2023-01-30 MED ORDER — ACETAMINOPHEN 650 MG RE SUPP: 650.0000 mg | Freq: Four times a day (QID) | RECTAL | Status: DC | PRN

## 2023-01-30 MED ORDER — HYDRALAZINE HCL 25 MG PO TABS
25.0000 mg | ORAL_TABLET | Freq: Three times a day (TID) | ORAL | Status: DC
  Administered 2023-01-30 – 2023-02-01 (×6): 25 mg via ORAL
  Filled 2023-01-30 (×6): qty 1

## 2023-01-30 MED ORDER — LORATADINE 10 MG PO TABS
10.0000 mg | ORAL_TABLET | Freq: Every day | ORAL | Status: DC
  Administered 2023-01-30 – 2023-02-02 (×4): 10 mg via ORAL
  Filled 2023-01-30 (×4): qty 1

## 2023-01-30 MED ORDER — MORPHINE SULFATE (PF) 2 MG/ML IV SOLN: 1.0000 mg | INTRAVENOUS | Status: DC | PRN

## 2023-01-30 MED ORDER — RIVAROXABAN 15 MG PO TABS
15.0000 mg | ORAL_TABLET | Freq: Every day | ORAL | Status: DC
  Administered 2023-01-30 – 2023-02-02 (×4): 15 mg via ORAL
  Filled 2023-01-30 (×5): qty 1

## 2023-01-30 MED ORDER — SENNOSIDES-DOCUSATE SODIUM 8.6-50 MG PO TABS: 1.0000 | ORAL_TABLET | Freq: Every evening | ORAL | Status: DC | PRN

## 2023-01-30 MED ORDER — DOCUSATE SODIUM 100 MG PO CAPS: 100.0000 mg | ORAL_CAPSULE | Freq: Two times a day (BID) | ORAL | Status: DC | PRN

## 2023-01-30 NOTE — Progress Notes (Signed)
[facility] Progress Note Patient Name: SHYNICE WOODHOUSE DOB: 03/15/1941 MRN: [id number]   Date of Service  01/30/2023  HPI/Events of Note  BSRN asking for PRN HTN Med so cleviprex can come off, currently at 26mq, asking for hydralazine  144/93(101)  HR 56        May gola is 100-110  eICU Interventions  CVA. Already has hydralazine scheduled  orally, ok to give at 9 PM, instead of 10 PM.      Intervention Category Intermediate Interventions: Hypertension - evaluation and management  KElmer Sow2/24/2024, 8:11 PM

## 2023-01-30 NOTE — Progress Notes (Signed)
BP spiked with H/a during bed change due to pure wick leak. HA increased in intensity  HOB increased, gtt titrated, h/a easing off per pt.

## 2023-01-30 NOTE — ED Notes (Signed)
All belongings taken to 4N.  Jewelry and glasses placed in black tote.

## 2023-01-30 NOTE — Progress Notes (Signed)
Called by RN regarding headache, nausea which was increased after lying flat for repositioning.  BP within parameters, no neuro exam changes.    PRN morphine '1mg'$  x1 & anti-emetic.      Noe Gens, MSN, APRN, NP-C, AGACNP-BC [facility] 01/30/2023, 12:51 PM   Please see [email or web address] for pager details.   From 7A-7P if no response, please call [phone] After hours, please call ELink [phone]

## 2023-01-30 NOTE — Progress Notes (Signed)
Pt  still c/ severe h/a, moaning, nausea. NP notified, orders received. BP at goal. No neuro logical defecit.

## 2023-01-30 NOTE — H&P (Signed)
NAME:  Erin Cunningham, MRN:  [id number], DOB:  [birth year], LOS: 1 ADMISSION DATE:  01/29/2023, CONSULTATION DATE:  2/24 REFERRING MD:  Dr. Claria Dice, CHIEF COMPLAINT:  Aphasia, Elevated BP   History of Present Illness:  82 y/o F who presented to [facility] ER on 2/23 via EMS from the urgent care with reports of headache, generalized weakness and aphasia.   The patient reports she was trying to control her blood pressure without taking her medications.  On 2/23, she developed difficulty with word finding / getting her speech to come out, then aphasia that lasted approximately 30-45 minutes.  She went to the UC with headache, weakness and elevated blood pressure.  Initial BP 220/110, HR 84.  She does have a history of prior cerebellar stroke.  On arrival to UC, her symptoms had resolved.  Initial labs largely normal. STAT CT of the Head was negative for acute intracranial abnormality. 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 She was started on cleviprex for HTN.  Initially admitted per [facility].   Due to IV BP control, PCCM consulted for ICU admission.   Pertinent  Medical History  Breast Cancer  HTN  Major Depressive Disorder  Parotic Mass / Benign Cyst  Cerebellar CVA Wears Glasses EGD - positive for H. Pylori   Significant Hospital Events: Including procedures, antibiotic start and stop dates in addition to other pertinent events   2/24 Admit with hypertensive emergency, aphasia   Interim History / Subjective:  Afebrile  Pt denies acute complaints   Objective   Blood pressure (!) 166/94, pulse 60, temperature 97.9 F (36.6 C), temperature source Oral, resp. rate 18, height '5\' 3"'$  (1.6 m), weight 72.6 kg, SpO2 98 %.       No intake or output data in the 24 hours ending 01/30/23 0754 Filed Weights    01/29/23 1859  Weight: 72.6 kg    Examination: General: well appearing elderly adult female lying in bed in NAD HENT: MM pink/moist, good dentition, anicteric Lungs: non-labored at rest, lungs clear bilaterally  Cardiovascular: s1s2 RRR, SR 70's on monitor, no MRG Abdomen: soft, non-tender/non-distended, bsx4 active  Extremities: warm/dry, trace LE edema  Neuro: AAOx4, speech clear, MAE, non-focal on exam     Resolved Hospital Problem list      Assessment & Plan:   Hypertensive Emergency  HTN  Initial BP 220/110, MAP 147.  Had aphasia on presentation, resolved spontaneously.  -admit to ICU  -resume home coreg  -resume home hydralazine for this afternoon, 1400 dose  -hold home cozaar for now  -MAP goal ~25% reduction from admission for first 6 hours. MAP goal 100-110.  -cleviprex IV for above  -follow neuro exam closely, at baseline  -follow up labs in am to assess for other end organ damage  Atrial Fibrillation  On xarelto  -resume 2/24 PM  -tele monitoring  Depression  -continue zoloft   Best Practice (right click and "Reselect all SmartList Selections" daily)  Diet/type: Regular consistency (see orders) DVT prophylaxis: LMWH GI prophylaxis: N/A Lines: N/A Foley:  N/A Code Status:  full code Last date of multidisciplinary goals of care discussion: full scope care.    Labs   CBC: Recent Labs  Lab 01/29/23 1921 01/29/23 1926 01/30/23 0459  WBC 6.1  --  7.6  NEUTROABS 3.2  --  5.6  HGB 13.4 13.9  13.9  HCT 41.1 41.0 41.8  MCV 89.3  --  87.4  PLT 296  --  [id number]    Basic Metabolic Panel: Recent Labs  Lab 01/29/23 1921 01/29/23 1926 01/30/23 0459  NA 137 140 136  K 4.1 4.5 3.5  CL 104 105 102  CO2 25  --  24  GLUCOSE 86 84 122*  BUN '15 18 11  '$ CREATININE 0.96 0.90 0.92  CALCIUM 10.6*  --  10.3   GFR: Estimated Creatinine Clearance: 45.8 mL/min (by C-G formula based on SCr of 0.92 mg/dL). Recent Labs  Lab 01/29/23 1921 01/30/23 0459  WBC 6.1  7.6    Liver Function Tests: Recent Labs  Lab 01/29/23 1921  AST 16  ALT 10  ALKPHOS 111  BILITOT 0.9  PROT 6.7  ALBUMIN 3.7   No results for input(s): "LIPASE", "AMYLASE" in the last 168 hours. No results for input(s): "AMMONIA" in the last 168 hours.  ABG    Component Value Date/Time   TCO2 26 01/29/2023 1926     Coagulation Profile: Recent Labs  Lab 01/29/23 1921  INR 1.0    Cardiac Enzymes: No results for input(s): "CKTOTAL", "CKMB", "CKMBINDEX", "TROPONINI" in the last 168 hours.  HbA1C: Hgb A1c MFr Bld  Date/Time Value Ref Range Status  05/29/2020 05:43 AM 5.6 4.8 - 5.6 % Final    Comment:    (NOTE) Pre diabetes:          5.7%-6.4%  Diabetes:              >6.4%  Glycemic control for   <7.0% adults with diabetes   01/15/2020 10:32 AM 5.5 <5.7 % of total Hgb Final    Comment:    For the purpose of screening for the presence of diabetes: . <5.7%       Consistent with the absence of diabetes 5.7-6.4%    Consistent with increased risk for diabetes             (prediabetes) > or =6.5%  Consistent with diabetes . This assay result is consistent with a decreased risk of diabetes. . Currently, no consensus exists regarding use of hemoglobin A1c for diagnosis of diabetes in children. . According to American Diabetes Association (ADA) guidelines, hemoglobin A1c <7.0% represents optimal control in non-pregnant diabetic patients. Different metrics may apply to specific patient populations.  Standards of Medical Care in Diabetes(ADA). .     CBG: Recent Labs  Lab 01/29/23 1935  GLUCAP 82    Review of Systems: Positives in [place]  Gen: Denies fever, chills, weight change, fatigue, night sweats HEENT: Denies blurred vision, double vision, hearing loss, tinnitus, sinus congestion, rhinorrhea, sore throat, neck stiffness, dysphagia PULM: Denies shortness of breath, cough, sputum production, hemoptysis, wheezing CV: Denies chest pain, edema,  orthopnea, paroxysmal nocturnal dyspnea, palpitations GI: Denies abdominal pain, nausea, vomiting, diarrhea, hematochezia, melena, constipation, change in bowel habits GU: Denies dysuria, hematuria, polyuria, oliguria, urethral discharge Endocrine: Denies hot or cold intolerance, polyuria, polyphagia or appetite change Derm: Denies rash, dry skin, scaling or peeling skin change Heme: Denies easy bruising, bleeding, bleeding gums Neuro: Denies headache, numbness, generalized weakness, slurred speech, aphasia, loss of memory or consciousness  Past Medical History:  She,  has a past medical history of Breast CA ([place]) (2006), Cancer ([place]), Helicobacter pylori gastritis (2001), High blood pressure, History of adenomatous polyp of colon (09/23/2017), History of shingles, Major depressive disorder (05/28/2020), Major depressive disorder, recurrent, severe without psychotic features ([place]) (06/24/2015), Parotid mass (  2014), Personal history of radiation therapy (2006), Stroke (cerebrum) ([place]) (06/28/2020), and Wears glasses.   Surgical History:   Past Surgical History:  Procedure Laterality Date   ABDOMINAL HYSTERECTOMY  1982   BSO   BREAST LUMPECTOMY Right 2007   BREAST LUMPECTOMY WITH SENTINEL LYMPH NODE BIOPSY  2005   right   BREAST SURGERY  2004   rt br mass   CESAREAN SECTION     COLONOSCOPY     ESOPHAGOGASTRODUODENOSCOPY  2001   H pylori gastritis   EYE SURGERY     both cataracts   PAROTIDECTOMY Right 09/05/2013   Procedure: RIGHT SUPERFICIAL PAROTIDECTOMY WITH FACIAL NERVE DISECTION;  Surgeon: [name], MD;  Location: [facility];  Service: ENT;  Laterality: Right;  All documentation done by R.Ward after [phone] --done under G. Garzon's password   RE-EXCISION OF BREAST LUMPECTOMY  2005   right     Social History:   reports that she has never smoked. Her smokeless tobacco use includes chew. She reports current alcohol use. She reports that she does not use drugs.    Family History:  Her family history includes Cancer in her father; Heart disease in her mother. There is no history of Thyroid disease, Hypercalcemia, Colon cancer, Stomach cancer, Rectal cancer, or Esophageal cancer.   Allergies No Known Allergies   Home Medications  Prior to Admission medications   Medication Sig Start Date End Date Taking? Authorizing Provider  acetaminophen (TYLENOL) 500 MG tablet Take 2 tablets (1,000 mg total) by mouth every 6 (six) hours as needed. 08/24/20   Charlesetta Shanks, MD  atorvastatin (LIPITOR) 80 MG tablet Take 1 tablet (80 mg total) by mouth daily. 04/10/22   Lauree Chandler, NP  benzonatate (TESSALON) 100 MG capsule Take 1 capsule (100 mg total) by mouth 2 (two) times daily as needed for cough. 12/23/22   Ngetich, Dinah C, NP  carvedilol (COREG) 6.25 MG tablet Take 1 tablet (6.25 mg total) by mouth 2 (two) times daily with a meal. 04/21/21   Lauree Chandler, NP  diazepam (VALIUM) 5 MG tablet Take one tablet by mouth with food one hour prior to procedure. May repeat 30 minutes prior if needed. 01/21/23   Lorine Bears, NP  fluticasone (FLONASE) 50 MCG/ACT nasal spray Place 2 sprays into both nostrils daily. 12/08/22   Ngetich, Dinah C, NP  hydrALAZINE (APRESOLINE) 25 MG tablet Take 1 tablet (25 mg total) by mouth 3 (three) times daily. 04/10/22   Lauree Chandler, NP  loratadine (CLARITIN) 10 MG tablet Take 1 tablet (10 mg total) by mouth daily. 12/08/22   Ngetich, Dinah C, NP  losartan (COZAAR) 100 MG tablet Take 1 tablet (100 mg total) by mouth daily. 04/20/22   Lauree Chandler, NP  methocarbamol (ROBAXIN) 500 MG tablet Take 1 tablet (500 mg total) by mouth 2 (two) times daily as needed for muscle spasms. 12/08/22   Aundra Dubin, PA-C  predniSONE (STERAPRED UNI-PAK 21 TAB) 10 MG (21) TBPK tablet Take as directed 12/08/22   Aundra Dubin, PA-C  rivaroxaban (XARELTO) 20 MG TABS tablet Take 1 tablet (20 mg total) by mouth daily with supper. 12/12/21    [name], MD  sertraline (ZOLOFT) 100 MG tablet Take 1 tablet (100 mg total) by mouth daily. 01/27/23   Arfeen, Arlyce Harman, MD  traMADol (ULTRAM) 50 MG tablet Take 1 tablet (50 mg total) by mouth every 8 (eight) hours. 01/21/23   Lorine Bears, NP  traZODone (DESYREL) 50 MG tablet Take 1 tablet (50 mg total) by mouth at bedtime as needed for sleep. Patient not taking: Reported on 01/27/2023 08/04/22   [name], MD     Critical care time: 34 minutes    Noe Gens, MSN, APRN, NP-C, AGACNP-BC [facility] 01/30/2023, 7:57 AM   Please see [email or web address] for pager details.   From 7A-7P if no response, please call [phone] After hours, please call ELink [phone]

## 2023-01-30 NOTE — ED Notes (Signed)
Gave report to Luellen Pucker, RN in ICU

## 2023-01-30 NOTE — Progress Notes (Signed)
Pt reports pain and nausea have resolved.

## 2023-01-30 NOTE — ED Notes (Signed)
ED TO INPATIENT HANDOFF REPORT  ED Nurse Name and Phone #: Theresia Lo A. Fue Cervenka, RN  S Name/Age/Gender Erin Cunningham 82 y.o. female Room/Bed: 034C/034C  Code Status   Code Status: Full Code  Home/SNF/Other Home Patient oriented to: self, place, time, and situation Is this baseline? Yes   Triage Complete: Triage complete  Chief Complaint Double vision [H53.2]  Triage Note PER EMS: pt sent from Urgent Care with c/o aphasia that started at 1pm and lasted 3 hours. She went to Urgent Care around 1620 with c/o headache, generalized weakness and HTN. She is A&OX4, speaking clear complete sentences. She was sent from urgent care with concern for stroke.   BP-220/110, HR-84, O2-100%   Allergies No Known Allergies  Level of Care/Admitting Diagnosis ED Disposition     ED Disposition  Admit   Condition  --   Comment  Hospital Area: [facility] [[id number]]  Level of Care: ICU [6]  May admit patient to [facility] or [facility] if equivalent level of care is available:: Yes  Covid Evaluation: Confirmed COVID Negative  Diagnosis: Double vision [id number]  Admitting Physician: PCCM, MD [phone]  Attending Physician: PCCM, MD [id number]  Certification:: I certify this patient will need inpatient services for at least 2 midnights  Estimated Length of Stay: 2          B Medical/Surgery History Past Medical History:  Diagnosis Date   Breast CA ([place]) 2006   right ([place])   Cancer ([place])    Helicobacter pylori gastritis 2001   High blood pressure    History of adenomatous polyp of colon 09/23/2017   Oma 09/25/2017 and apparently polyps in the past as well though recalled age   History of shingles    Major depressive disorder 05/28/2020   Major depressive disorder, recurrent, severe without psychotic features ([place]) 06/24/2015   Parotid mass 2014   right benign cyst   Personal history of radiation therapy 2006   Rt breast   Stroke (cerebrum) ([place]) 06/28/2020   Per  patient   Wears glasses    Past Surgical History:  Procedure Laterality Date   ABDOMINAL HYSTERECTOMY  1982   BSO   BREAST LUMPECTOMY Right 2007   BREAST LUMPECTOMY WITH SENTINEL LYMPH NODE BIOPSY  2005   right   BREAST SURGERY  2004   rt br mass   CESAREAN SECTION     COLONOSCOPY     ESOPHAGOGASTRODUODENOSCOPY  2001   H pylori gastritis   EYE SURGERY     both cataracts   PAROTIDECTOMY Right 09/05/2013   Procedure: RIGHT SUPERFICIAL PAROTIDECTOMY WITH FACIAL NERVE DISECTION;  Surgeon: [name], MD;  Location: [facility];  Service: ENT;  Laterality: Right;  All documentation done by R.Mikenzi Raysor after [phone] --done under G. Garzon's password   RE-EXCISION OF BREAST LUMPECTOMY  2005   right     A IV Location/Drains/Wounds Patient Lines/Drains/Airways Status     Active Line/Drains/Airways     Name Placement date Placement time Site Days   Peripheral IV 01/29/23 20 G Left Antecubital 01/29/23  1945  Antecubital  1            Intake/Output Last 24 hours No intake or output data in the 24 hours ending 01/30/23 [id number]  Labs/Imaging Results for orders placed or performed during the hospital encounter of 01/29/23 (from the past 48 hour(s))  Protime-INR     Status: None   Collection Time: 01/29/23  7:21 PM  Result  Value Ref Range   Prothrombin Time 13.3 11.4 - 15.2 seconds   INR 1.0 0.8 - 1.2    Comment: (NOTE) INR goal varies based on device and disease states. Performed at [facility], [place] [address], [place], [place] [address]   APTT     Status: None   Collection Time: 01/29/23  7:21 PM  Result Value Ref Range   aPTT 36 24 - 36 seconds    Comment: Performed at [facility] [address], [place], [place] [address]  CBC     Status: None   Collection Time: 01/29/23  7:21 PM  Result Value Ref Range   WBC 6.1 4.0 - 10.5 K/uL   RBC 4.60 3.87 - 5.11 MIL/uL   Hemoglobin 13.4 12.0 - 15.0 g/dL   HCT 41.1 36.0 - 46.0 %   MCV 89.3 80.0 -  100.0 fL   MCH 29.1 26.0 - 34.0 pg   MCHC 32.6 30.0 - 36.0 g/dL   RDW 13.5 11.5 - 15.5 %   Platelets 296 150 - 400 K/uL   nRBC 0.0 0.0 - 0.2 %    Comment: Performed at [facility], [place] [address], [place], [place] [address]  Differential     Status: None   Collection Time: 01/29/23  7:21 PM  Result Value Ref Range   Neutrophils Relative % 51 %   Neutro Abs 3.2 1.7 - 7.7 K/uL   Lymphocytes Relative 34 %   Lymphs Abs 2.0 0.7 - 4.0 K/uL   Monocytes Relative 9 %   Monocytes Absolute 0.5 0.1 - 1.0 K/uL   Eosinophils Relative 4 %   Eosinophils Absolute 0.3 0.0 - 0.5 K/uL   Basophils Relative 2 %   Basophils Absolute 0.1 0.0 - 0.1 K/uL   Immature Granulocytes 0 %   Abs Immature Granulocytes 0.01 0.00 - 0.07 K/uL    Comment: Performed at [facility], [place] [address] [place], [place] [address]  Comprehensive metabolic panel     Status: Abnormal   Collection Time: 01/29/23  7:21 PM  Result Value Ref Range   Sodium 137 135 - 145 mmol/L   Potassium 4.1 3.5 - 5.1 mmol/L   Chloride 104 98 - 111 mmol/L   CO2 25 22 - 32 mmol/L   Glucose, Bld 86 70 - 99 mg/dL    Comment: Glucose reference range applies only to samples taken after fasting for at least 8 hours.   BUN 15 8 - 23 mg/dL   Creatinine, Ser 0.96 0.44 - 1.00 mg/dL   Calcium 10.6 (H) 8.9 - 10.3 mg/dL   Total Protein 6.7 6.5 - 8.1 g/dL   Albumin 3.7 3.5 - 5.0 g/dL   AST 16 15 - 41 U/L   ALT 10 0 - 44 U/L   Alkaline Phosphatase 111 38 - 126 U/L   Total Bilirubin 0.9 0.3 - 1.2 mg/dL   GFR, Estimated 59 (L) >60 mL/min    Comment: (NOTE) Calculated using the CKD-EPI Creatinine Equation (2021)    Anion gap 8 5 - 15    Comment: Performed at [facility] [address] [place], [place] [address]  Ethanol     Status: None   Collection Time: 01/29/23  7:21 PM  Result Value Ref Range   Alcohol, Ethyl (B) <10 <10 mg/dL    Comment: (NOTE) Lowest detectable limit for serum alcohol is 10 mg/dL.  For medical purposes  only. Performed at [facility]  [facility], [place] [address] [place], [place] [address]   I-stat chem 8, ED     Status: None   Collection Time: 01/29/23  7:26 PM  Result Value Ref Range   Sodium 140 135 - 145 mmol/L   Potassium 4.5 3.5 - 5.1 mmol/L   Chloride 105 98 - 111 mmol/L   BUN 18 8 - 23 mg/dL   Creatinine, Ser 0.90 0.44 - 1.00 mg/dL   Glucose, Bld 84 70 - 99 mg/dL    Comment: Glucose reference range applies only to samples taken after fasting for at least 8 hours.   Calcium, Ion 1.35 1.15 - 1.40 mmol/L   TCO2 26 22 - 32 mmol/L   Hemoglobin 13.9 12.0 - 15.0 g/dL   HCT 41.0 36.0 - 46.0 %  CBG monitoring, ED     Status: None   Collection Time: 01/29/23  7:35 PM  Result Value Ref Range   Glucose-Capillary 82 70 - 99 mg/dL    Comment: Glucose reference range applies only to samples taken after fasting for at least 8 hours.  Basic metabolic panel     Status: Abnormal   Collection Time: 01/30/23  4:59 AM  Result Value Ref Range   Sodium 136 135 - 145 mmol/L   Potassium 3.5 3.5 - 5.1 mmol/L   Chloride 102 98 - 111 mmol/L   CO2 24 22 - 32 mmol/L   Glucose, Bld 122 (H) 70 - 99 mg/dL    Comment: Glucose reference range applies only to samples taken after fasting for at least 8 hours.   BUN 11 8 - 23 mg/dL   Creatinine, Ser 0.92 0.44 - 1.00 mg/dL   Calcium 10.3 8.9 - 10.3 mg/dL   GFR, Estimated >60 >60 mL/min    Comment: (NOTE) Calculated using the CKD-EPI Creatinine Equation (2021)    Anion gap 10 5 - 15    Comment: Performed at [facility] [address] [place], [place] [address]  CBC with Differential/Platelet     Status: None   Collection Time: 01/30/23  4:59 AM  Result Value Ref Range   WBC 7.6 4.0 - 10.5 K/uL   RBC 4.78 3.87 - 5.11 MIL/uL   Hemoglobin 13.9 12.0 - 15.0 g/dL   HCT 41.8 36.0 - 46.0 %   MCV 87.4 80.0 - 100.0 fL   MCH 29.1 26.0 - 34.0 pg   MCHC 33.3 30.0 - 36.0 g/dL   RDW 13.3 11.5 - 15.5 %   Platelets 306 150 - 400 K/uL   nRBC 0.0 0.0 - 0.2 %    Neutrophils Relative % 74 %   Neutro Abs 5.6 1.7 - 7.7 K/uL   Lymphocytes Relative 16 %   Lymphs Abs 1.2 0.7 - 4.0 K/uL   Monocytes Relative 8 %   Monocytes Absolute 0.6 0.1 - 1.0 K/uL   Eosinophils Relative 1 %   Eosinophils Absolute 0.1 0.0 - 0.5 K/uL   Basophils Relative 1 %   Basophils Absolute 0.1 0.0 - 0.1 K/uL   Immature Granulocytes 0 %   Abs Immature Granulocytes 0.02 0.00 - 0.07 K/uL    Comment: Performed at [facility], [address], [place],  [address]   MR BRAIN WO CONTRAST  Result Date: 01/30/2023 CLINICAL DATA:  82 year old female with episode of aphasia. Weakness. Increasing headache. Hypertensive. EXAM: MRI HEAD WITHOUT CONTRAST TECHNIQUE: Multiplanar, multiecho pulse sequences of the brain and surrounding structures were obtained without intravenous contrast. COMPARISON:  Head CT yesterday.  Brain MRI 05/29/2020. FINDINGS: Brain: No restricted diffusion to suggest acute infarction. No midline shift, mass effect, evidence of mass lesion, ventriculomegaly, extra-axial collection or acute intracranial hemorrhage. Small roughly 12 mm cortical located cerebral cavernous venous vascular malformation redemonstrated at the junction of the right middle and superior frontal gyri (series 17, image 17 and SWI series 12, image 45). No regional edema or evidence of recent bleeding. Minimal other cerebral chronic microhemorrhage on susceptibility imaging is stable. Expected evolution of left posterior lentiform lacunar infarct in 2021. Other scattered bilateral cerebral white matter T2 and FLAIR hyperintensity is stable. T2 and FLAIR heterogeneity is mildly progressed in central pons. The other deep gray matter nuclei in the cerebellum remain normal for age. There is stable chronic heterogeneous size and signal of the pituitary gland, with a 7 mm mildly T2 hyperintense area of gland enlargement laterally on the left. Suprasellar cistern remains normal and there is no regional mass  effect, no change since 2021. Vascular: Major intracranial vascular flow voids are stable. Skull and upper cervical spine: Chronic calcified ligamentous hypertrophy about the odontoid results in mild chronic cervicomedullary junction-C1 stenosis which does not appear significantly changed since 2021. Background bone marrow signal is within normal limits. Sinuses/Orbits: Stable orbits. Chronic paranasal sinus mucosal thickening. Other: Mastoids remain clear. Grossly normal visible internal auditory structures. Visible scalp and face appear negative. IMPRESSION: 1. No acute intracranial abnormality. 2. Chronic small vessel disease, mildly progressed in the pons since a 2021 MRI. 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 3. Stable subcentimeter left pituitary lesion with no regional mass effect. No further imaging evaluation or follow-up is necessary. Consider endocrine function tests and correlate for history of pituitary hypersecretion. This follows ACR consensus guidelines: Management of Incidental Pituitary Findings on CT, MRI and F18-FDG PET: A White Paper of the ACR Incidental Findings Committee. J Am Coll Radiol 2018; 15: 966-72. 4. Chronic and degenerative mild cervicomedullary junction-C1 stenosis. Electronically Signed   By: Genevie Ann M.D.   On: 01/30/2023 04:34   CT HEAD WO CONTRAST  Result Date: 01/29/2023 CLINICAL DATA:  Aphasia EXAM: CT HEAD WITHOUT CONTRAST TECHNIQUE: Contiguous axial images were obtained from the base of the skull through the vertex without intravenous contrast. RADIATION DOSE REDUCTION: This exam was performed according to the departmental dose-optimization program which includes automated exposure control, adjustment of the mA and/or kV according to patient size and/or use of iterative reconstruction technique. COMPARISON:  None Available. FINDINGS: Brain: There is no mass, hemorrhage or extra-axial collection. There is generalized  atrophy without lobar predilection. Hypodensity of the white matter is most commonly associated with chronic microvascular disease. Hyperdensity in the right frontal lobe consistent with cavernous angioma, unchanged. Vascular: No abnormal hyperdensity of the major intracranial arteries or dural venous sinuses. No intracranial atherosclerosis. Skull: The visualized skull base, calvarium and extracranial soft tissues are normal. Sinuses/Orbits: Bilateral maxillary retention cysts. the orbits are normal. IMPRESSION: 1. No acute intracranial abnormality. 2. Generalized atrophy and findings of chronic microvascular disease. Electronically Signed   By: Ulyses Jarred M.D.   On: 01/29/2023 20:13    Pending Labs Unresulted Labs (From admission, onward)     Start     Ordered   02/06/23 0500  Creatinine, serum  (enoxaparin (LOVENOX)    CrCl >/= 30 ml/min)  Weekly,   R     Comments: while on enoxaparin therapy    01/30/23 0352   01/30/23 0353  CBC  (  enoxaparin (LOVENOX)    CrCl >/= 30 ml/min)  Once,   R       Comments: Baseline for enoxaparin therapy IF NOT ALREADY DRAWN.  Notify MD if PLT < 100 K.    01/30/23 0352            Vitals/Pain Today's Vitals   01/30/23 0540 01/30/23 0545 01/30/23 0550 01/30/23 0628  BP: (!) 155/78 (!) 152/68 (!) 159/69   Pulse: 65 65 68   Resp: '18 17 18   '$ Temp:    97.9 F (36.6 C)  TempSrc:    Oral  SpO2: 95% 96% 96%   Weight:      Height:      PainSc:        Isolation Precautions No active isolations  Medications Medications  clevidipine (CLEVIPREX) infusion 0.5 mg/mL (4 mg/hr Intravenous Rate/Dose Change 01/30/23 0252)  carvedilol (COREG) tablet 6.25 mg (has no administration in time range)  atorvastatin (LIPITOR) tablet 80 mg (has no administration in time range)  losartan (COZAAR) tablet 100 mg (has no administration in time range)  sertraline (ZOLOFT) tablet 100 mg (100 mg Oral Given 01/30/23 0142)  hydrALAZINE (APRESOLINE) tablet 25 mg (has no  administration in time range)  enoxaparin (LOVENOX) injection 40 mg (has no administration in time range)  acetaminophen (TYLENOL) tablet 650 mg (has no administration in time range)    Or  acetaminophen (TYLENOL) suppository 650 mg (has no administration in time range)  senna-docusate (Senokot-S) tablet 1 tablet (has no administration in time range)  ondansetron (ZOFRAN) tablet 4 mg (has no administration in time range)    Or  ondansetron (ZOFRAN) injection 4 mg (has no administration in time range)  sodium chloride flush (NS) 0.9 % injection 3 mL (3 mLs Intravenous Given 01/29/23 2012)  acetaminophen (TYLENOL) tablet 1,000 mg (1,000 mg Oral Given 01/29/23 2153)  HYDROmorphone (DILAUDID) injection 0.5 mg (0.5 mg Intravenous Given 01/30/23 0142)    Mobility walks     Focused Assessments Cardiac Assessment Handoff:    No results found for: "CKTOTAL", "CKMB", "CKMBINDEX", "TROPONINI" No results found for: "DDIMER" Does the Patient currently have chest pain? No   , Neuro Assessment Handoff:  Swallow screen pass? Yes    NIH Stroke Scale  Dizziness Present: No Headache Present: Yes Interval: Shift assessment Level of Consciousness (1a.)   : Alert, keenly responsive LOC Questions (1b. )   : Answers both questions correctly LOC Commands (1c. )   : Performs both tasks correctly Best Gaze (2. )  : Normal Visual (3. )  : No visual loss Facial Palsy (4. )    : Normal symmetrical movements Motor Arm, Left (5a. )   : No drift Motor Arm, Right (5b. ) : No drift Motor Leg, Left (6a. )  : No drift Motor Leg, Right (6b. ) : No drift Limb Ataxia (7. ): Absent Sensory (8. )  : Normal, no sensory loss Best Language (9. )  : No aphasia Dysarthria (10. ): Normal Extinction/Inattention (11.)   : No Abnormality Complete NIHSS TOTAL: 0     Neuro Assessment: Exceptions to WDL Neuro Checks:   Initial (01/29/23 1929)  Has TPA been given? No If patient is a Neuro Trauma and patient is going  to OR before floor call report to [place] nurse: [phone] or [phone]   R Recommendations: See Admitting Provider Note  Report given to:   Additional Notes:  CALL OR EPIC message for any other questions

## 2023-01-31 DIAGNOSIS — I161 Hypertensive emergency: Secondary | ICD-10-CM | POA: Diagnosis not present

## 2023-01-31 LAB — BASIC METABOLIC PANEL
Anion gap: 7 (ref 5–15)
BUN: 17 mg/dL (ref 8–23)
CO2: 26 mmol/L (ref 22–32)
Calcium: 10.5 mg/dL — ABNORMAL HIGH (ref 8.9–10.3)
Chloride: 102 mmol/L (ref 98–111)
Creatinine, Ser: 1.13 mg/dL — ABNORMAL HIGH (ref 0.44–1.00)
GFR, Estimated: 49 mL/min — ABNORMAL LOW (ref >60)
Glucose, Bld: 106 mg/dL — ABNORMAL HIGH (ref 70–99)
Potassium: 3.9 mmol/L (ref 3.5–5.1)
Sodium: 135 mmol/L (ref 135–145)

## 2023-01-31 LAB — MAGNESIUM: Magnesium: 2.3 mg/dL (ref 1.7–2.4)

## 2023-01-31 LAB — CBC
HCT: 41.9 % (ref 36.0–46.0)
Hemoglobin: 14 g/dL (ref 12.0–15.0)
MCH: 28.9 pg (ref 26.0–34.0)
MCHC: 33.4 g/dL (ref 30.0–36.0)
MCV: 86.6 fL (ref 80.0–100.0)
Platelets: 318 10*3/uL (ref 150–400)
RBC: 4.84 MIL/uL (ref 3.87–5.11)
RDW: 13.3 % (ref 11.5–15.5)
WBC: 6.9 10*3/uL (ref 4.0–10.5)
nRBC: 0 % (ref 0.0–0.2)

## 2023-01-31 LAB — TRIGLYCERIDES: Triglycerides: 108 mg/dL (ref <150)

## 2023-01-31 LAB — PHOSPHORUS: Phosphorus: 3.1 mg/dL (ref 2.5–4.6)

## 2023-01-31 MED ORDER — LOSARTAN POTASSIUM 50 MG PO TABS
50.0000 mg | ORAL_TABLET | Freq: Every day | ORAL | Status: DC
  Administered 2023-01-31 – 2023-02-02 (×3): 50 mg via ORAL
  Filled 2023-01-31 (×3): qty 1

## 2023-01-31 MED ORDER — LABETALOL HCL 5 MG/ML IV SOLN
20.0000 mg | INTRAVENOUS | Status: DC | PRN
  Administered 2023-01-31: 20 mg via INTRAVENOUS
  Filled 2023-01-31: qty 4

## 2023-01-31 NOTE — Progress Notes (Signed)
NAME:  Erin Cunningham, MRN:  [id number], DOB:  09-25-1941, LOS: 2 ADMISSION DATE:  01/29/2023, CONSULTATION DATE:  2/24 REFERRING MD:  Dr. Claria Dice, CHIEF COMPLAINT:  Aphasia, Elevated BP   History of Present Illness:  82 y/o F who presented to [facility] ER on 2/23 via EMS from the urgent care with reports of headache, generalized weakness and aphasia.   The patient reports she was trying to control her blood pressure without taking her medications.  On 2/23, she developed difficulty with word finding / getting her speech to come out, then aphasia that lasted approximately 30-45 minutes.  She went to the UC with headache, weakness and elevated blood pressure.  Initial BP 220/110, HR 84.  She does have a history of prior cerebellar stroke.  On arrival to UC, her symptoms had resolved.  Initial labs largely normal. STAT CT of the Head was negative for acute intracranial abnormality. 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 She was started on cleviprex for HTN.  Initially admitted per [facility].   Due to IV BP control, PCCM consulted for ICU admission.   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   Pertinent  Medical History  Breast Cancer  HTN  Major Depressive Disorder  Parotic Mass / Benign Cyst  Cerebellar CVA Wears Glasses EGD - positive for H. Pylori   Significant Hospital Events: Including procedures, antibiotic start and stop dates in addition to other pertinent events   2/24 Admit with hypertensive emergency, aphasia   Interim History / Subjective:  BP control improved, denies further headache / nausea  On RA   Objective   Blood pressure (!) 153/75, pulse 67, temperature 98.2 F (36.8 C), temperature source Oral, resp. rate (!) 21, height  '5\' 3"'$  (1.6 m), weight 72.6 kg, SpO2 96 %.        Intake/Output Summary (Last 24 hours) at 01/31/2023 [id number] Last data filed at 01/31/2023 0600 Gross per 24 hour  Intake 121.15 ml  Output --  Net 121.15 ml   Filed Weights   01/29/23 1859  Weight: 72.6 kg    Examination: General:  well preserved elderly female lying in bed in NAD HEENT: MM pink/moist, good dentition, anicteric  Neuro: AAOx4, speech clear, MAE  CV: s1s2 RRR, SR 60's on monitor, no m/r/g PULM: non-labored at rest, lungs bilaterally clear with good air entry  GI: soft, bsx4 active, tolerating PO's  Extremities: warm/dry, no edema  Skin: no rashes or lesions   Resolved Hospital Problem list      Assessment & Plan:   Hypertensive Emergency  HTN  Initial BP 220/110, MAP 147.  Had aphasia on presentation, resolved spontaneously.  -weaned off cleviprex 2/25 0740  -continue prior home coreg dosing, hydralazine (unfortunately all removed from home med rec b/c pt stated she was not taking) -resume home cozaar at half prior home dose  -loosen BP parameters  -monitor neuro exam  -renal function / neuro status has remained stable -anticipate will be able to transfer from ICU this afternoon   Atrial Fibrillation  On xarelto at baseline, NSR currently  -xarelto resumed 2/24, dose reduced for renal function  -tele monitoring   Depression  -zoloft  -support offered to patient   Best Practice (right click and "Reselect all SmartList Selections" daily)  Diet/type: Regular consistency (see orders) DVT prophylaxis: DOAC GI prophylaxis: N/A Lines: N/A Foley:  N/A Code Status:  full code Last date of multidisciplinary goals of care discussion: full scope care.    Critical care time: 31 minutes    Noe Gens, MSN, APRN, NP-C, AGACNP-BC [facility] 01/31/2023, 7:26 AM   Please see [email or web address] for pager details.   From 7A-7P if no response, please call [phone] After hours, please call  ELink [phone]

## 2023-01-31 NOTE — Progress Notes (Signed)
Pt transferred to [facility] room 14. Pt has phone, glasses, [place] bag, plastic belonging bag. Attempted to call husband but no answer at home or cell number. Bedside handoff with Ashleigh RN complete.

## 2023-01-31 NOTE — Progress Notes (Signed)
Pt transferred to 4NP-14 from 4N ICU. A&O x 4, denies pain at this time. Glasses and cellphone at bedside. VS and assessment documented. Pt oriented to unit. Callbell within reach, bed alarm on.

## 2023-02-01 DIAGNOSIS — I10 Essential (primary) hypertension: Secondary | ICD-10-CM | POA: Diagnosis not present

## 2023-02-01 DIAGNOSIS — I161 Hypertensive emergency: Secondary | ICD-10-CM | POA: Diagnosis not present

## 2023-02-01 LAB — CBC
HCT: 39.7 % (ref 36.0–46.0)
Hemoglobin: 13 g/dL (ref 12.0–15.0)
MCH: 28.6 pg (ref 26.0–34.0)
MCHC: 32.7 g/dL (ref 30.0–36.0)
MCV: 87.4 fL (ref 80.0–100.0)
Platelets: 320 10*3/uL (ref 150–400)
RBC: 4.54 MIL/uL (ref 3.87–5.11)
RDW: 13.3 % (ref 11.5–15.5)
WBC: 7.3 10*3/uL (ref 4.0–10.5)
nRBC: 0 % (ref 0.0–0.2)

## 2023-02-01 LAB — BASIC METABOLIC PANEL
Anion gap: 8 (ref 5–15)
BUN: 28 mg/dL — ABNORMAL HIGH (ref 8–23)
CO2: 24 mmol/L (ref 22–32)
Calcium: 10.4 mg/dL — ABNORMAL HIGH (ref 8.9–10.3)
Chloride: 101 mmol/L (ref 98–111)
Creatinine, Ser: 1.41 mg/dL — ABNORMAL HIGH (ref 0.44–1.00)
GFR, Estimated: 37 mL/min — ABNORMAL LOW (ref >60)
Glucose, Bld: 104 mg/dL — ABNORMAL HIGH (ref 70–99)
Potassium: 3.7 mmol/L (ref 3.5–5.1)
Sodium: 133 mmol/L — ABNORMAL LOW (ref 135–145)

## 2023-02-01 MED ORDER — CARVEDILOL 3.125 MG PO TABS
3.1250 mg | ORAL_TABLET | Freq: Two times a day (BID) | ORAL | Status: DC
  Administered 2023-02-01 – 2023-02-02 (×3): 3.125 mg via ORAL
  Filled 2023-02-01 (×3): qty 1

## 2023-02-01 MED ORDER — HYDRALAZINE HCL 50 MG PO TABS
50.0000 mg | ORAL_TABLET | Freq: Three times a day (TID) | ORAL | Status: DC
  Administered 2023-02-01 – 2023-02-02 (×3): 50 mg via ORAL
  Filled 2023-02-01 (×3): qty 1

## 2023-02-01 NOTE — Progress Notes (Signed)
PROGRESS NOTE  Erin Cunningham    DOB: 13-Jan-1941, 82 y.o.  [id number]    Code Status: Full Code   DOA: 01/29/2023   LOS: 3   Brief hospital course  Erin Cunningham is a 82 y.o. female with a PMH significant for breast cancer, HTN, major depressive disorder, cerebellar CVA.  They presented from urgent care to the ED on 01/29/2023 with headache and elevated blood pressure.  Patient states that she was not taking her home blood pressure medications due to the fact that she was feeling pretty well.  In the ED, it was found that they had initial blood pressure 220/110, heart rate 84.  Significant findings included Initial labs largely normal. STAT CT of the Head was negative for acute intracranial abnormality. 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  They were initially treated with cleviprex for HTN.  Initially admitted per [facility].   Due to IV BP control, PCCM consulted for ICU admission. .   Patient was admitted to medicine service for further workup and management of hypertension urgency as outlined in detail below.  02/01/23 -[facility] assumed care  Assessment & Plan  Principal Problem:   Malignant hypertension Active Problems:   History of breast cancer in female   Chewing tobacco nicotine dependence with nicotine-induced disorder   Hypertrophic cardiomyopathy ([place])   Anxiety and depression   History of CVA (cerebrovascular accident)   Paroxysmal atrial fibrillation (HCC)   Hyperlipidemia LDL goal <70   Headache   MDD (major depressive disorder)   Double vision   Hypertensive emergency  Hypertensive Emergency  HTN  Initial BP 220/110, MAP 147.  Had aphasia on presentation, resolved spontaneously.  Brain imaging negative for ischemia or hemorrhagic stroke.  Continue to modify home blood  pressure medication regimen to allow for better control of blood pressure and not affecting heart rate which was low this morning. -weaned off cleviprex 2/25 0740  -Decrease Coreg dose -Increase hydralazine dose -Avoid as needed blood pressure medications as reaction to elevated blood pressures unless in emergent levels due to high risk of complications for rapid decrease in blood pressure -monitor neuro exam  -renal function / neuro status has remained stable   Atrial Fibrillation  On xarelto at baseline, NSR currently  -xarelto resumed 2/24, dose reduced for renal function  -tele monitoring    Depression  -zoloft  -support offered to patient  Body mass index is 28.35 kg/m.  VTE ppx: SCDs Start: 01/30/23 0749 Rivaroxaban (XARELTO) tablet 15 mg   Diet:     Diet   Diet Heart Room service appropriate? Yes with Assist; Fluid consistency: Thin   Consultants: CCM  Subjective 02/01/23    Pt reports feeling overall well today.  She has a headache this morning.  She just received Tylenol prior to our encounter.   Objective   Vitals:   02/01/23 0352 02/01/23 0615 02/01/23 0736 02/01/23 0831  BP: (!) 146/65 (!) 145/84 137/83 (!) 145/69  Pulse: 61  (!) 58 (!) 58  Resp: '17  12 17  '$ Temp: 98.6 F (37 C)  98.4 F (36.9 C)   TempSrc: Oral  Oral   SpO2: 96%  95% 94%  Weight: 72.6 kg     Height:        Intake/Output Summary (Last 24 hours) at 02/01/2023 [id number] Last data filed at 01/31/2023 1941  Gross per 24 hour  Intake 240 ml  Output --  Net 240 ml   Filed Weights   01/29/23 1859 02/01/23 0352  Weight: 72.6 kg 72.6 kg     Physical Exam:  General: awake, alert, NAD. tired appearing HEENT: atraumatic, clear conjunctiva, anicteric sclera, MMM, hearing grossly normal Respiratory: normal respiratory effort.  CTAB Cardiovascular: quick capillary refill, normal S1/S2, RRR, no JVD, murmurs Gastrointestinal: soft, NT, ND Nervous: A&O x3. no gross focal neurologic deficits,  normal speech Extremities: moves all equally, no edema, normal tone Skin: dry, intact, normal temperature, normal color. No rashes, lesions or ulcers on exposed skin Psychiatry: normal mood, congruent affect  Labs   I have personally reviewed the following labs and imaging studies CBC    Component Value Date/Time   WBC 7.3 02/01/2023 0310   RBC 4.54 02/01/2023 0310   HGB 13.0 02/01/2023 0310   HGB 13.2 11/17/2021 1038   HGB 13.0 03/21/2009 1054   HCT 39.7 02/01/2023 0310   HCT 39.9 11/17/2021 1038   HCT 38.8 03/21/2009 1054   PLT 320 02/01/2023 0310   PLT 444 11/17/2021 1038   MCV 87.4 02/01/2023 0310   MCV 87 11/17/2021 1038   MCV 84.2 03/21/2009 1054   MCH 28.6 02/01/2023 0310   MCHC 32.7 02/01/2023 0310   RDW 13.3 02/01/2023 0310   RDW 12.5 11/17/2021 1038   RDW 13.5 03/21/2009 1054   LYMPHSABS 1.2 01/30/2023 0459   LYMPHSABS 2.1 03/11/2016 1705   LYMPHSABS 1.7 03/21/2009 1054   MONOABS 0.6 01/30/2023 0459   MONOABS 0.3 03/21/2009 1054   EOSABS 0.1 01/30/2023 0459   EOSABS 0.2 03/11/2016 1705   BASOSABS 0.1 01/30/2023 0459   BASOSABS 0.1 03/11/2016 1705   BASOSABS 0.1 03/21/2009 1054      Latest Ref Rng & Units 02/01/2023    3:10 AM 01/31/2023    4:12 AM 01/30/2023    4:59 AM  BMP  Glucose 70 - 99 mg/dL 104  106  122   BUN 8 - 23 mg/dL '28  17  11   '$ Creatinine 0.44 - 1.00 mg/dL 1.41  1.13  0.92   Sodium 135 - 145 mmol/L 133  135  136   Potassium 3.5 - 5.1 mmol/L 3.7  3.9  3.5   Chloride 98 - 111 mmol/L 101  102  102   CO2 22 - 32 mmol/L '24  26  24   '$ Calcium 8.9 - 10.3 mg/dL 10.4  10.5  10.3     Disposition Plan & Communication  Patient status: Inpatient  Admitted From: Home Planned disposition location: Home Anticipated discharge date: 2/27 pending stabilization of blood pressure and heart rate  Family Communication: None at bedside   Author: Richarda Osmond, DO [facility] 02/01/2023, 9:03 AM   Available by Epic secure chat 7AM-7PM. If  7PM-7AM, please contact night-coverage.  [facility] contact information found on [email or web address].

## 2023-02-01 NOTE — Progress Notes (Signed)
Pt HR sustaining between 51-55 BP 145/69 (93). Okay to hold coreg per Dr. Ouida Sills.

## 2023-02-01 NOTE — Progress Notes (Signed)
Pt BP elevated 183/92 (118) c/o headache denies chest pain or SOB. Dr. Ouida Sills at bedside. Will adjust medications. Awaiting new orders.

## 2023-02-02 ENCOUNTER — Inpatient Hospital Stay (HOSPITAL_COMMUNITY): Payer: BC Managed Care – PPO

## 2023-02-02 ENCOUNTER — Telehealth (HOSPITAL_COMMUNITY): Payer: BC Managed Care – PPO | Admitting: Psychiatry

## 2023-02-02 DIAGNOSIS — E785 Hyperlipidemia, unspecified: Secondary | ICD-10-CM

## 2023-02-02 DIAGNOSIS — I161 Hypertensive emergency: Secondary | ICD-10-CM | POA: Diagnosis not present

## 2023-02-02 DIAGNOSIS — M7989 Other specified soft tissue disorders: Secondary | ICD-10-CM | POA: Diagnosis not present

## 2023-02-02 LAB — BASIC METABOLIC PANEL
Anion gap: 6 (ref 5–15)
BUN: 26 mg/dL — ABNORMAL HIGH (ref 8–23)
CO2: 25 mmol/L (ref 22–32)
Calcium: 10.4 mg/dL — ABNORMAL HIGH (ref 8.9–10.3)
Chloride: 103 mmol/L (ref 98–111)
Creatinine, Ser: 1.17 mg/dL — ABNORMAL HIGH (ref 0.44–1.00)
GFR, Estimated: 47 mL/min — ABNORMAL LOW (ref >60)
Glucose, Bld: 104 mg/dL — ABNORMAL HIGH (ref 70–99)
Potassium: 4.5 mmol/L (ref 3.5–5.1)
Sodium: 134 mmol/L — ABNORMAL LOW (ref 135–145)

## 2023-02-02 MED ORDER — DICLOFENAC SODIUM 1 % EX GEL
4.0000 g | Freq: Four times a day (QID) | CUTANEOUS | Status: DC
  Administered 2023-02-02 (×2): 4 g via TOPICAL
  Filled 2023-02-02: qty 100

## 2023-02-02 MED ORDER — DICLOFENAC SODIUM 1 % EX GEL: 4.0000 g | Freq: Four times a day (QID) | CUTANEOUS | Status: DC

## 2023-02-02 MED ORDER — BACLOFEN 10 MG PO TABS
5.0000 mg | ORAL_TABLET | Freq: Once | ORAL | Status: AC
  Administered 2023-02-02: 5 mg via ORAL
  Filled 2023-02-02: qty 1

## 2023-02-02 MED ORDER — CARVEDILOL 3.125 MG PO TABS: 3.1250 mg | ORAL_TABLET | Freq: Two times a day (BID) | ORAL | 0 refills | Status: DC

## 2023-02-02 MED ORDER — ACETAMINOPHEN 500 MG PO TABS: 500.0000 mg | ORAL_TABLET | Freq: Four times a day (QID) | ORAL | 0 refills | Status: AC | PRN

## 2023-02-02 MED ORDER — HYDRALAZINE HCL 50 MG PO TABS
100.0000 mg | ORAL_TABLET | Freq: Three times a day (TID) | ORAL | Status: DC
  Administered 2023-02-02: 100 mg via ORAL
  Filled 2023-02-02: qty 2

## 2023-02-02 MED ORDER — RIVAROXABAN 15 MG PO TABS: 15.0000 mg | ORAL_TABLET | Freq: Every day | ORAL | 0 refills | Status: DC

## 2023-02-02 MED ORDER — HYDRALAZINE HCL 100 MG PO TABS: 100.0000 mg | ORAL_TABLET | Freq: Three times a day (TID) | ORAL | 0 refills | Status: DC

## 2023-02-02 MED ORDER — LOSARTAN POTASSIUM 50 MG PO TABS: 50.0000 mg | ORAL_TABLET | Freq: Every day | ORAL | 0 refills | Status: DC

## 2023-02-02 MED ORDER — ATORVASTATIN CALCIUM 80 MG PO TABS: 80.0000 mg | ORAL_TABLET | Freq: Every day | ORAL | 0 refills | Status: DC

## 2023-02-02 NOTE — Progress Notes (Signed)
LLE venous duplex has been completed.   Results can be found under chart review under CV PROC. 02/02/2023 12:05 PM Quincy Boy RVT, RDMS

## 2023-02-02 NOTE — Progress Notes (Signed)
Patient discharging home with family. Vital signs stable at time of discharge as reflected in discharge summary. Discharge instructions given and verbal understanding returned. No questions at this time. Patient refuse cane.

## 2023-02-02 NOTE — Discharge Instructions (Signed)
Please take all of your blood pressure medications as prescribed. Even if you feel well, you need to continue to take them. High blood pressure doesn't cause pain, it just causes damage to all of your organs until they start hurting.  Follow up with your doctor in 1-2 weeks to make sure your blood pressure is still being well controlled when out of the hospital.   Your foot pain appears to be plantar fasciitis. Rolling your foot on ice bottles and stretching your foot can improve the pain.   Follow up with cardiology as their office has recommended

## 2023-02-02 NOTE — TOC Transition Note (Signed)
Transition of Care (TOC) - CM/SW Discharge Note Marvetta Gibbons RN,BSN Transitions of Care Unit 4NP (Non Trauma)- RN Case Manager See Treatment Team for direct Phone #   Patient Details  Name: Erin Cunningham MRN: [id number] Date of Birth: 01-Aug-1941  Transition of [facility]) CM/SW Contact:  Dawayne Patricia, RN Phone Number: 02/02/2023, 2:40 PM   Clinical Narrative:    Pt stable for transition home today, DME- cane order placed- CM has reached out to in-house provider for DME need- Cane to be delivered to room prior to discharge.   No further TOC needs noted. Pt to return home.    Final next level of care: Home/Self Care Barriers to Discharge: No Barriers Identified   Patient Goals and CMS Choice CMS Medicare.gov Compare Post Acute Care list provided to:: Patient Choice offered to / list presented to : Patient  Discharge Placement                 Home        Discharge Plan and Services Additional resources added to the After Visit Summary for     Discharge Planning Services: CM Consult Post Acute Care Choice: Durable Medical Equipment          DME Arranged: Kasandra Knudsen DME Agency: [employer] Date DME Agency Contacted: 02/02/23 Time DME Agency Contacted: [id number] Representative spoke with at DME Agency: Maudie Mercury HH Arranged: NA [place] Agency: NA        Social Determinants of Health ([place]) Interventions SDOH Screenings   Depression (PHQ2-9): Low Risk  (03/06/2022)  Tobacco Use: High Risk (01/30/2023)     Readmission Risk Interventions    02/02/2023    2:40 PM  Readmission Risk Prevention Plan  Post Dischage Appt Complete  Medication Screening Complete  Transportation Screening Complete

## 2023-02-02 NOTE — Discharge Summary (Signed)
Physician Discharge Summary  Patient: Erin Cunningham [id number] DOB: 1941-01-06   Code Status: Full Code Admit date: 01/29/2023 Discharge date: 02/02/2023 Disposition: Home, No home health services recommended PCP: Lauree Chandler, NP  Recommendations for Outpatient Follow-up:  Follow up with PCP within 1-2 weeks Regarding general hospital follow up and preventative care Recommend monitoring BP on new regimen and checking metabolic panel Also complained of plantar fasciitis during admission. Given home remedies and may benefit from outpatient PT if continues Follow up with cardiology   Discharge Diagnoses:  Principal Problem:   Malignant hypertension Active Problems:   History of breast cancer in female   Chewing tobacco nicotine dependence with nicotine-induced disorder   Hypertrophic cardiomyopathy ([place])   Anxiety and depression   History of CVA (cerebrovascular accident)   Paroxysmal atrial fibrillation ([place])   Hyperlipidemia LDL goal <70   Headache   MDD (major depressive disorder)   Double vision   Hypertensive emergency   Primary hypertension  Brief Hospital Course Summary: Erin Cunningham is a 82 y.o. female with a PMH significant for breast cancer, HTN, major depressive disorder, cerebellar CVA.   They presented from urgent care to the ED on 01/29/2023 with headache and elevated blood pressure.  Patient states that she was not taking her home blood pressure medications due to the fact that she was feeling pretty well.   In the ED, it was found that they had initial blood pressure 220/110, heart rate 84.  Significant findings included Initial labs largely normal. STAT CT of the Head was negative for acute intracranial abnormality. 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    They were initially treated with cleviprex for HTN.  Initially admitted per [facility].   Due to IV BP control, PCCM consulted for ICU admission. .    Patient was admitted to medicine service for further workup and management of hypertension urgency as outlined in detail below.  Xarelto was decreased.  Plantar fasciitis evaluated by PT and consistent with xray findings. Doppler negative for DVT.   Discharge Condition: {DISCHARGE CONDITION:19696}, improved Recommended discharge diet: {Discharge KM:[id number]  Consultations: ***  Procedures/Studies: ***  Discharge Instructions     Discharge patient   Complete by: As directed    Discharge disposition: 01-Home or Self Care   Discharge patient date: 02/02/2023      Allergies as of 02/02/2023   No Known Allergies      Medication List     STOP taking these medications    benzonatate 100 MG capsule Commonly known as: TESSALON   diazepam 5 MG tablet Commonly known as: VALIUM   fluticasone 50 MCG/ACT nasal spray Commonly known as: FLONASE   loratadine 10 MG tablet Commonly known as: CLARITIN   methocarbamol 500 MG tablet Commonly known as: ROBAXIN   predniSONE 10 MG (21) Tbpk tablet Commonly known as: STERAPRED UNI-PAK 21 TAB   traMADol 50 MG tablet Commonly known as: ULTRAM       TAKE these medications    acetaminophen 500 MG tablet Commonly known as: TYLENOL Take 1 tablet (500 mg total) by mouth every 6 (six) hours as needed for mild pain (or Fever >/= 101).   atorvastatin 80 MG tablet Commonly known as: LIPITOR Take 1 tablet (80 mg total) by mouth daily.   carvedilol 3.125 MG tablet Commonly known as: COREG  Take 1 tablet (3.125 mg total) by mouth 2 (two) times daily with a meal. What changed:  medication strength how much to take   diclofenac Sodium 1 % Gel Commonly known as: VOLTAREN Apply 4 g topically 4 (four) times daily.   hydrALAZINE 100 MG tablet Commonly known as: APRESOLINE Take 1  tablet (100 mg total) by mouth every 8 (eight) hours.   losartan 50 MG tablet Commonly known as: COZAAR Take 1 tablet (50 mg total) by mouth daily. Start taking on: February 03, 2023   Rivaroxaban 15 MG Tabs tablet Commonly known as: XARELTO Take 1 tablet (15 mg total) by mouth daily with supper. What changed:  medication strength how much to take   sertraline 100 MG tablet Commonly known as: ZOLOFT Take 1 tablet (100 mg total) by mouth daily.   VITAMIN D PO Take 1 tablet by mouth daily.               Durable Medical Equipment  (From admission, onward)           Start     Ordered   02/02/23 1346  For home use only DME Cane  Once        02/02/23 1345             Subjective   Pt reports ***  All questions and concerns were addressed at time of discharge.  Objective  Blood pressure (!) 154/58, pulse 70, temperature 97.6 F (36.4 C), temperature source Oral, resp. rate 20, height '5\' 3"'$  (1.6 m), weight 72.4 kg, SpO2 96 %.   General: Pt is alert, awake, not in acute distress Cardiovascular: RRR, S1/S2 +, no rubs, no gallops Respiratory: CTA bilaterally, no wheezing, no rhonchi Abdominal: Soft, NT, ND, bowel sounds + Extremities: no edema, no cyanosis  The results of significant diagnostics from this hospitalization (including imaging, microbiology, ancillary and laboratory) are listed below for reference.   Imaging studies: DG Foot 2 Views Left  Result Date: 02/02/2023 CLINICAL DATA:  Left foot pain, no stated injury EXAM: LEFT FOOT - 2 VIEW COMPARISON:  None Available. FINDINGS: No fracture or dislocation of the left foot. Mild first metatarsophalangeal arthrosis. Joint spaces otherwise preserved. Plantar calcaneal spur and calcification within the plantar fascia. Soft tissues unremarkable. IMPRESSION: 1. No fracture or dislocation of the left foot. Mild first metatarsophalangeal arthrosis. Joint spaces otherwise preserved. 2. Plantar calcaneal spur and  calcification within the plantar fascia. Electronically Signed   By: Delanna Ahmadi M.D.   On: 02/02/2023 14:02   VAS [place] LOWER EXTREMITY VENOUS (DVT)  Result Date: 02/02/2023  Lower Venous DVT Study Patient Name:  Erin Cunningham  Date of Exam:   02/02/2023 Medical Rec #: [id number]       Accession #:    [id number] Date of Birth: 06/15/1941       Patient Gender: F Patient Age:   9 years Exam [facility]  [facility] Procedure:      VAS [place] LOWER EXTREMITY VENOUS (DVT) Referring Phys: Doristine Mango --------------------------------------------------------------------------------  Indications: Swelling.  Comparison Study: No previous exams Performing Technologist: Jody Hill RVT, RDMS  Examination Guidelines: A complete evaluation includes B-mode imaging, spectral Doppler, color Doppler, and power Doppler as needed of all accessible portions of each vessel. Bilateral testing is considered an integral part of a complete examination. Limited examinations for reoccurring indications may be performed as noted. The reflux portion of the exam is performed with the patient in reverse Trendelenburg.  +-----+---------------+---------+-----------+----------+--------------+ RIGHTCompressibilityPhasicitySpontaneityPropertiesThrombus Aging +-----+---------------+---------+-----------+----------+--------------+  CFV  Full           Yes      Yes                                 +-----+---------------+---------+-----------+----------+--------------+   +---------+---------------+---------+-----------+----------+--------------+ LEFT     CompressibilityPhasicitySpontaneityPropertiesThrombus Aging +---------+---------------+---------+-----------+----------+--------------+ CFV      Full           Yes      Yes                                 +---------+---------------+---------+-----------+----------+--------------+ SFJ      Full                                                         +---------+---------------+---------+-----------+----------+--------------+ FV Prox  Full           Yes      Yes                                 +---------+---------------+---------+-----------+----------+--------------+ FV Mid   Full           Yes      Yes                                 +---------+---------------+---------+-----------+----------+--------------+ FV DistalFull           Yes      Yes                                 +---------+---------------+---------+-----------+----------+--------------+ PFV      Full                                                        +---------+---------------+---------+-----------+----------+--------------+ POP      Full           No       Yes                                 +---------+---------------+---------+-----------+----------+--------------+ PTV      Full                                                        +---------+---------------+---------+-----------+----------+--------------+ PERO     Full                                                        +---------+---------------+---------+-----------+----------+--------------+     Summary: RIGHT: - No  evidence of common femoral vein obstruction.  LEFT: - There is no evidence of deep vein thrombosis in the lower extremity.  - No cystic structure found in the popliteal fossa.  *See table(s) above for measurements and observations.    Preliminary    MR BRAIN WO CONTRAST  Result Date: 01/30/2023 CLINICAL DATA:  82 year old female with episode of aphasia. Weakness. Increasing headache. Hypertensive. EXAM: MRI HEAD WITHOUT CONTRAST TECHNIQUE: Multiplanar, multiecho pulse sequences of the brain and surrounding structures were obtained without intravenous contrast. COMPARISON:  Head CT yesterday.  Brain MRI 05/29/2020. FINDINGS: Brain: No restricted diffusion to suggest acute infarction. No midline shift, mass effect, evidence of mass lesion, ventriculomegaly, extra-axial  collection or acute intracranial hemorrhage. Small roughly 12 mm cortical located cerebral cavernous venous vascular malformation redemonstrated at the junction of the right middle and superior frontal gyri (series 17, image 17 and SWI series 12, image 45). No regional edema or evidence of recent bleeding. Minimal other cerebral chronic microhemorrhage on susceptibility imaging is stable. Expected evolution of left posterior lentiform lacunar infarct in 2021. Other scattered bilateral cerebral white matter T2 and FLAIR hyperintensity is stable. T2 and FLAIR heterogeneity is mildly progressed in central pons. The other deep gray matter nuclei in the cerebellum remain normal for age. There is stable chronic heterogeneous size and signal of the pituitary gland, with a 7 mm mildly T2 hyperintense area of gland enlargement laterally on the left. Suprasellar cistern remains normal and there is no regional mass effect, no change since 2021. Vascular: Major intracranial vascular flow voids are stable. Skull and upper cervical spine: Chronic calcified ligamentous hypertrophy about the odontoid results in mild chronic cervicomedullary junction-C1 stenosis which does not appear significantly changed since 2021. Background bone marrow signal is within normal limits. Sinuses/Orbits: Stable orbits. Chronic paranasal sinus mucosal thickening. Other: Mastoids remain clear. Grossly normal visible internal auditory structures. Visible scalp and face appear negative. IMPRESSION: 1. No acute intracranial abnormality. 2. Chronic small vessel disease, mildly progressed in the pons since a 2021 MRI. 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 3. Stable subcentimeter left pituitary lesion with no regional mass effect. No further imaging evaluation or follow-up is necessary. Consider endocrine function tests and correlate for history of pituitary hypersecretion. This follows ACR consensus  guidelines: Management of Incidental Pituitary Findings on CT, MRI and F18-FDG PET: A White Paper of the ACR Incidental Findings Committee. J Am Coll Radiol 2018; 15: 966-72. 4. Chronic and degenerative mild cervicomedullary junction-C1 stenosis. Electronically Signed   By: Genevie Ann M.D.   On: 01/30/2023 04:34   CT HEAD WO CONTRAST  Result Date: 01/29/2023 CLINICAL DATA:  Aphasia EXAM: CT HEAD WITHOUT CONTRAST TECHNIQUE: Contiguous axial images were obtained from the base of the skull through the vertex without intravenous contrast. RADIATION DOSE REDUCTION: This exam was performed according to the departmental dose-optimization program which includes automated exposure control, adjustment of the mA and/or kV according to patient size and/or use of iterative reconstruction technique. COMPARISON:  None Available. FINDINGS: Brain: There is no mass, hemorrhage or extra-axial collection. There is generalized atrophy without lobar predilection. Hypodensity of the white matter is most commonly associated with chronic microvascular disease. Hyperdensity in the right frontal lobe consistent with cavernous angioma, unchanged. Vascular: No abnormal hyperdensity of the major intracranial arteries or dural venous sinuses. No intracranial atherosclerosis. Skull: The visualized skull base, calvarium and extracranial soft tissues are normal. Sinuses/Orbits: Bilateral maxillary retention cysts. the orbits are normal. IMPRESSION:  1. No acute intracranial abnormality. 2. Generalized atrophy and findings of chronic microvascular disease. Electronically Signed   By: Ulyses Jarred M.D.   On: 01/29/2023 20:13    Labs: Basic Metabolic Panel: Recent Labs  Lab 01/29/23 1921 01/29/23 1926 01/30/23 0459 01/31/23 0412 02/01/23 0310 02/02/23 0040  NA 137 140 136 135 133* 134*  K 4.1 4.5 3.5 3.9 3.7 4.5  CL 104 105 102 102 101 103  CO2 25  --  '24 26 24 25  '$ GLUCOSE 86 84 122* 106* 104* 104*  BUN '15 18 11 17 '$ 28* 26*   CREATININE 0.96 0.90 0.92 1.13* 1.41* 1.17*  CALCIUM 10.6*  --  10.3 10.5* 10.4* 10.4*  MG  --   --   --  2.3  --   --   PHOS  --   --   --  3.1  --   --    CBC: Recent Labs  Lab 01/29/23 1921 01/29/23 1926 01/30/23 0459 01/31/23 0412 02/01/23 0310  WBC 6.1  --  7.6 6.9 7.3  NEUTROABS 3.2  --  5.6  --   --   HGB 13.4 13.9 13.9 14.0 13.0  HCT 41.1 41.0 41.8 41.9 39.7  MCV 89.3  --  87.4 86.6 87.4  PLT 296  --  306 318 320   Microbiology: ***  Time coordinating discharge: Over 30 minutes  Richarda Osmond, MD  [facility] 02/02/2023, 2:13 PM

## 2023-02-02 NOTE — Progress Notes (Signed)
Patient reporting pain on left leg to foot. Per the patient the pain is to level that she can not put any weight on the foot. Notified MD.

## 2023-02-02 NOTE — Evaluation (Signed)
Physical Therapy Evaluation Patient Details Name: Erin Cunningham MRN: [id number] DOB: Feb 12, 1941 Today's Date: 02/02/2023  History of Present Illness  82 y.o. female presents to [facility] hospital on 01/29/2023 with HA and HTN. Imaging negative. Pt treated with cleviprex for BP control. 2/27 pt reports acute left leg/foot pain. PMH includes MDD, HTN, CVA, breat cancer.  Clinical Impression  Pt presents to PT with deficits in strength, power, gait, balance, and with L foot/calf pain. Pt reports tenderness to palpation of plantar arch and plantar aspect of 5th metatarsal, along with radiating pain into calf with resister PF or loading of calf. PT provides education on calf stretch with use of towel/sheet in addition to towel scrunch exercise to strengthen L foot intrinsic muscles. PT recommends use of SPC when ambulating initially at the time of discharge to improve balance while foot pain persists. PT recommends the pt obtain outpatient PT consult if pain persists despite exercises provided.       Recommendations for follow up therapy are one component of a multi-disciplinary discharge planning process, led by the attending physician.  Recommendations may be updated based on patient status, additional functional criteria and insurance authorization.  Follow Up Recommendations No PT follow up (recommend ouptatient PT if symptoms do not improve within ~1 week with exercises provided and increased mobility)      Assistance Recommended at Discharge PRN  Patient can return home with the following  A little help with bathing/dressing/bathroom;Assistance with cooking/housework;Help with stairs or ramp for entrance    Equipment Recommendations Cane (PT placed order for [facility])  Recommendations for Other Services       Functional Status Assessment Patient has had a recent decline in their functional status and demonstrates the ability to make significant improvements in function in a reasonable and predictable  amount of time.     Precautions / Restrictions Precautions Precautions: Fall Precaution Comments: L foot arch pain radiating into calf Restrictions Weight Bearing Restrictions: No      Mobility  Bed Mobility Overal bed mobility: Independent                  Transfers Overall transfer level: Needs assistance Equipment used: None Transfers: Sit to/from Stand Sit to Stand: Supervision                Ambulation/Gait Ambulation/Gait assistance: Min guard Gait Distance (Feet): 120 Feet Assistive device: None, 1 person hand held assist Gait Pattern/deviations: Step-through pattern Gait velocity: reduced Gait velocity interpretation: <1.8 ft/sec, indicate of risk for recurrent falls   General Gait Details: slowed step-through gait, reduced stance time on LLE  Stairs            Wheelchair Mobility    Modified Rankin (Stroke Patients Only)       Balance Overall balance assessment: Needs assistance Sitting-balance support: No upper extremity supported, Feet supported Sitting balance-Leahy Scale: Good     Standing balance support: Single extremity supported, Reliant on assistive device for balance Standing balance-Leahy Scale: Poor                               Pertinent Vitals/Pain Pain Assessment Pain Assessment: 0-10 Pain Score: 3  Pain Location: L foot arch into calf Pain Descriptors / Indicators: Sore Pain Intervention(s): Monitored during session    Home Living Family/patient expects to be discharged to:: Private residence Living Arrangements: Spouse/significant other;Other relatives (55 y.o. grandson) Available Help at Discharge: Family;Available 24 hours/day  Type of Home: Mobile home Home Access: Stairs to enter Entrance Stairs-Rails: Can reach both Entrance Stairs-Number of Steps: 3   Home Layout: One level Home Equipment: None      Prior Function Prior Level of Function : Independent/Modified  Independent;Driving;Working/employed             Mobility Comments: works as a Consulting civil engineer for [employer]: Right    Extremity/Trunk Assessment   Upper Extremity Assessment Upper Extremity Assessment: Overall WFL for tasks assessed    Lower Extremity Assessment Lower Extremity Assessment: LLE deficits/detail LLE Deficits / Details: tender to palpation at L foot arch, otherwise WFL    Cervical / Trunk Assessment Cervical / Trunk Assessment: Normal  Communication   Communication: No difficulties  Cognition Arousal/Alertness: Awake/alert Behavior During Therapy: WFL for tasks assessed/performed Overall Cognitive Status: Within Functional Limits for tasks assessed                                          General Comments General comments (skin integrity, edema, etc.): VSS on RA    Exercises Other Exercises Other Exercises: L calf/arch stretch with towel/sheet, hold 30-60 seconds Other Exercises: towel scrunch exercise for intrinsic foot muscle strengthening   Assessment/Plan    PT Assessment Patient needs continued PT services  PT Problem List Decreased balance;Decreased mobility;Decreased activity tolerance;Pain       PT Treatment Interventions DME instruction;Gait training;Stair training;Functional mobility training;Therapeutic activities;Therapeutic exercise;Balance training;Patient/family education    PT Goals (Current goals can be found in the Care Plan section)  Acute Rehab PT Goals Patient Stated Goal: to go home PT Goal Formulation: With patient Time For Goal Achievement: 02/16/23 Potential to Achieve Goals: Good    Frequency Min 3X/week     Co-evaluation               AM-PAC PT "6 Clicks" Mobility  Outcome Measure Help needed turning from your back to your side while in a flat bed without using bedrails?: None Help needed moving from lying on your back to sitting on  the side of a flat bed without using bedrails?: None Help needed moving to and from a bed to a chair (including a wheelchair)?: A Little Help needed standing up from a chair using your arms (e.g., wheelchair or bedside chair)?: A Little Help needed to walk in hospital room?: A Little Help needed climbing 3-5 steps with a railing? : A Little 6 Click Score: 20    End of Session   Activity Tolerance: Patient tolerated treatment well Patient left: in chair;with call bell/phone within reach;with chair alarm set Nurse Communication: Mobility status PT Visit Diagnosis: Other abnormalities of gait and mobility (R26.89);Pain Pain - Right/Left: Left Pain - part of body: Ankle and joints of foot    Time: 1320-1345 PT Time Calculation (min) (ACUTE ONLY): 25 min   Charges:   PT Evaluation $PT Eval Low Complexity: [place], PT, DPT Acute Rehabilitation Office [place] 02/02/2023, 1:57 PM

## 2023-02-02 NOTE — Progress Notes (Incomplete)
PROGRESS NOTE  Erin Cunningham    DOB: Mar 14, 1941, 82 y.o.  [id number]    Code Status: Full Code   DOA: 01/29/2023   LOS: 4   Brief hospital course  Erin Cunningham is a 82 y.o. female with a PMH significant for breast cancer, HTN, major depressive disorder, cerebellar CVA.  They presented from urgent care to the ED on 01/29/2023 with headache and elevated blood pressure.  Patient states that she was not taking her home blood pressure medications due to the fact that she was feeling pretty well.  In the ED, it was found that they had initial blood pressure 220/110, heart rate 84.  Significant findings included Initial labs largely normal. STAT CT of the Head was negative for acute intracranial abnormality. 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  They were initially treated with cleviprex for HTN.  Initially admitted per [facility].   Due to IV BP control, PCCM consulted for ICU admission. .   Patient was admitted to medicine service for further workup and management of hypertension urgency as outlined in detail below.  02/02/23 -[facility] assumed care  Assessment & Plan  Principal Problem:   Malignant hypertension Active Problems:   History of breast cancer in female   Chewing tobacco nicotine dependence with nicotine-induced disorder   Hypertrophic cardiomyopathy ([place])   Anxiety and depression   History of CVA (cerebrovascular accident)   Paroxysmal atrial fibrillation (HCC)   Hyperlipidemia LDL goal <70   Headache   MDD (major depressive disorder)   Double vision   Hypertensive emergency   Primary hypertension  Hypertensive Emergency  HTN  Initial BP 220/110, MAP 147.  Had aphasia on presentation, resolved spontaneously.  Brain imaging negative for ischemia or hemorrhagic stroke.  Continue  to modify home blood pressure medication regimen to allow for better control of blood pressure and not affecting heart rate which was low this morning. -weaned off cleviprex 2/25 0740  -Decrease Coreg dose -Increase hydralazine dose -Avoid as needed blood pressure medications as reaction to elevated blood pressures unless in emergent levels due to high risk of complications for rapid decrease in blood pressure -monitor neuro exam  -renal function / neuro status has remained stable   Atrial Fibrillation  On xarelto at baseline, NSR currently  -xarelto resumed 2/24, dose reduced for renal function  -tele monitoring    Depression  -zoloft  -support offered to patient  Body mass index is 28.27 kg/m.  VTE ppx: SCDs Start: 01/30/23 0749 Rivaroxaban (XARELTO) tablet 15 mg   Diet:     Diet   Diet Heart Room service appropriate? Yes with Assist; Fluid consistency: Thin   Consultants: CCM  Subjective 02/02/23    Pt reports feeling overall well today.  She has a headache this morning.  She just received Tylenol prior to our encounter.   Objective   Vitals:   02/01/23 2216 02/01/23 2330 02/02/23 0315 02/02/23 0617  BP: (!) 158/89 (!) 156/78 (!) 156/73 (!) 160/82  Pulse:  72 61   Resp:  18 20   Temp:  98.4 F (36.9 C) 98.3 F (36.8 C)   TempSrc:  Oral Oral   SpO2:  96% 96%   Weight:   72.4 kg   Height:       No intake or output data in the 24 hours ending 02/02/23 0748  Filed Weights   01/29/23 1859 02/01/23 0352 02/02/23 0315  Weight: 72.6 kg 72.6 kg 72.4 kg     Physical Exam:  General: awake, alert, NAD. tired appearing HEENT: atraumatic, clear conjunctiva, anicteric sclera, MMM, hearing grossly normal Respiratory: normal respiratory effort.  CTAB Cardiovascular: quick capillary refill, normal S1/S2, RRR, no JVD, murmurs Gastrointestinal: soft, NT, ND Nervous: A&O x3. no gross focal neurologic deficits, normal speech Extremities: moves all equally, no edema,  normal tone Skin: dry, intact, normal temperature, normal color. No rashes, lesions or ulcers on exposed skin Psychiatry: normal mood, congruent affect  Labs   I have personally reviewed the following labs and imaging studies CBC    Component Value Date/Time   WBC 7.3 02/01/2023 0310   RBC 4.54 02/01/2023 0310   HGB 13.0 02/01/2023 0310   HGB 13.2 11/17/2021 1038   HGB 13.0 03/21/2009 1054   HCT 39.7 02/01/2023 0310   HCT 39.9 11/17/2021 1038   HCT 38.8 03/21/2009 1054   PLT 320 02/01/2023 0310   PLT 444 11/17/2021 1038   MCV 87.4 02/01/2023 0310   MCV 87 11/17/2021 1038   MCV 84.2 03/21/2009 1054   MCH 28.6 02/01/2023 0310   MCHC 32.7 02/01/2023 0310   RDW 13.3 02/01/2023 0310   RDW 12.5 11/17/2021 1038   RDW 13.5 03/21/2009 1054   LYMPHSABS 1.2 01/30/2023 0459   LYMPHSABS 2.1 03/11/2016 1705   LYMPHSABS 1.7 03/21/2009 1054   MONOABS 0.6 01/30/2023 0459   MONOABS 0.3 03/21/2009 1054   EOSABS 0.1 01/30/2023 0459   EOSABS 0.2 03/11/2016 1705   BASOSABS 0.1 01/30/2023 0459   BASOSABS 0.1 03/11/2016 1705   BASOSABS 0.1 03/21/2009 1054      Latest Ref Rng & Units 02/02/2023   12:40 AM 02/01/2023    3:10 AM 01/31/2023    4:12 AM  BMP  Glucose 70 - 99 mg/dL 104  104  106   BUN 8 - 23 mg/dL '26  28  17   '$ Creatinine 0.44 - 1.00 mg/dL 1.17  1.41  1.13   Sodium 135 - 145 mmol/L 134  133  135   Potassium 3.5 - 5.1 mmol/L 4.5  3.7  3.9   Chloride 98 - 111 mmol/L 103  101  102   CO2 22 - 32 mmol/L '25  24  26   '$ Calcium 8.9 - 10.3 mg/dL 10.4  10.4  10.5     Disposition Plan & Communication  Patient status: Inpatient  Admitted From: Home Planned disposition location: Home Anticipated discharge date: 2/27 pending stabilization of blood pressure and heart rate  Family Communication: None at bedside   Author: Richarda Osmond, DO [facility] 02/02/2023, 7:48 AM   Available by Epic secure chat 7AM-7PM. If 7PM-7AM, please contact night-coverage.  [facility] contact  information found on [email or web address].

## 2023-02-05 ENCOUNTER — Ambulatory Visit (INDEPENDENT_AMBULATORY_CARE_PROVIDER_SITE_OTHER): Payer: BC Managed Care – PPO | Admitting: Nurse Practitioner

## 2023-02-05 ENCOUNTER — Telehealth: Payer: Self-pay

## 2023-02-05 ENCOUNTER — Encounter: Payer: Self-pay | Admitting: Nurse Practitioner

## 2023-02-05 VITALS — BP 136/80 | HR 95 | Temp 97.3°F | Ht 63.0 in | Wt 157.0 lb

## 2023-02-05 DIAGNOSIS — I10 Essential (primary) hypertension: Secondary | ICD-10-CM | POA: Diagnosis not present

## 2023-02-05 DIAGNOSIS — D6869 Other thrombophilia: Secondary | ICD-10-CM

## 2023-02-05 DIAGNOSIS — R42 Dizziness and giddiness: Secondary | ICD-10-CM

## 2023-02-05 DIAGNOSIS — I700 Atherosclerosis of aorta: Secondary | ICD-10-CM

## 2023-02-05 DIAGNOSIS — Z636 Dependent relative needing care at home: Secondary | ICD-10-CM

## 2023-02-05 DIAGNOSIS — F332 Major depressive disorder, recurrent severe without psychotic features: Secondary | ICD-10-CM

## 2023-02-05 DIAGNOSIS — I480 Paroxysmal atrial fibrillation: Secondary | ICD-10-CM

## 2023-02-05 NOTE — Progress Notes (Signed)
Careteam: Patient Care Team: Lauree Chandler, NP as PCP - General (Geriatric Medicine) [name], MD (Inactive) as Consulting Physician (Otolaryngology) [name], MD as Consulting Physician (Cardiology)  PLACE OF SERVICE:  [place] Directive information Does Patient Have a Medical Advance Directive?: No, Would patient like information on creating a medical advance directive?: Yes (MAU/Ambulatory/Procedural Areas - Information given) (Given at previous appointment)  No Known Allergies  Chief Complaint  Patient presents with   Hospitalization Follow-up    Follow-up from hospital stay 01/29/2023-02/02/2023. Discuss need for shingrix vaccine. [facility] verified      HPI: Patient is a 82 y.o. female here for hospital follow-up. She went to the ED because she felt very dizzy and had a throbbing headache. BP was extremely elevated and she had to have IV medication and was admitted to the ICU for this. Imaging was negative  She says it really scared her. She had not been taking her BP prior, and she says she wasn't taking her xarelto either. It was unclear what medications she was taking before the hospital visit. Now she reports she is taking them 'all'.   Denies chest pain, shortness of breath, palpitations, swelling in extremities, headaches since admission. Reports a little bit of dizziness upon waking in the AM. ? BP elevated prior to taking medications, dehydration, patient reports eating infrequently as well. She drinks about 3 caffeinated drinks a day and not much water.  Reports BP at home has been 140s/80s. She has been checking it twice a day since her discharge.   Pt sometimes grabs breakfast on the way to work. She still works full-time at a middle school. She cooks at home but she feels it is unhealthy because she cooks to please her husband and 56-yr old grandson who lives with them.   Review of Systems:  Review of Systems   Constitutional:  Negative for chills, fever, malaise/fatigue and weight loss.  HENT:  Negative for congestion and sore throat.   Eyes:  Negative for blurred vision.  Respiratory:  Negative for cough, shortness of breath and wheezing.   Cardiovascular:  Negative for chest pain, palpitations and leg swelling.  Gastrointestinal:  Negative for abdominal pain, blood in stool, constipation, diarrhea, heartburn, nausea and vomiting.  Genitourinary:  Negative for dysuria, frequency, hematuria and urgency.  Musculoskeletal:  Negative for falls and joint pain.  Skin:  Negative for rash.  Neurological:  Positive for dizziness. Negative for tingling and headaches.  Endo/Heme/Allergies:  Negative for polydipsia.  Psychiatric/Behavioral:  Negative for depression. The patient is not nervous/anxious.   All other systems reviewed and are negative.   Past Medical History:  Diagnosis Date   Atrial fibrillation ([place])    Breast CA ([place]) 2006   right ([place])   Cancer ([place])    Helicobacter pylori gastritis 2001   High blood pressure    History of adenomatous polyp of colon 09/23/2017   [place] 09/25/2017 and apparently polyps in the past as well though recalled age   History of shingles    Major depressive disorder 05/28/2020   Major depressive disorder, recurrent, severe without psychotic features ([place]) 06/24/2015   Parotid mass 2014   right benign cyst   Personal history of radiation therapy 2006   Rt breast   Stroke (cerebrum) ([place]) 06/28/2020   Per patient   Wears glasses    Past Surgical History:  Procedure Laterality Date   ABDOMINAL HYSTERECTOMY  1982   BSO   BREAST LUMPECTOMY  Right 2007   BREAST LUMPECTOMY WITH SENTINEL LYMPH NODE BIOPSY  2005   right   BREAST SURGERY  2004   rt br mass   CESAREAN SECTION     COLONOSCOPY     ESOPHAGOGASTRODUODENOSCOPY  2001   H pylori gastritis   EYE SURGERY     both cataracts   PAROTIDECTOMY Right 09/05/2013   Procedure: RIGHT SUPERFICIAL  PAROTIDECTOMY WITH FACIAL NERVE DISECTION;  Surgeon: [name], MD;  Location: [place];  Service: ENT;  Laterality: Right;  All documentation done by R.Ward after [phone] --done under G. Garzon's password   RE-EXCISION OF BREAST LUMPECTOMY  2005   right   Social History:   reports that she has never smoked. Her smokeless tobacco use includes chew. She reports current alcohol use. She reports that she does not use drugs.  Family History  Problem Relation Age of Onset   Heart disease Mother        MI   Cancer Father        bone   Thyroid disease Neg Hx    Hypercalcemia Neg Hx    Colon cancer Neg Hx    Stomach cancer Neg Hx    Rectal cancer Neg Hx    Esophageal cancer Neg Hx     Medications: Patient's Medications  New Prescriptions   No medications on file  Previous Medications   ACETAMINOPHEN (TYLENOL) 500 MG TABLET    Take 1 tablet (500 mg total) by mouth every 6 (six) hours as needed for mild pain (or Fever >/= 101).   ATORVASTATIN (LIPITOR) 80 MG TABLET    Take 1 tablet (80 mg total) by mouth daily.   CARVEDILOL (COREG) 3.125 MG TABLET    Take 1 tablet (3.125 mg total) by mouth 2 (two) times daily with a meal.   DICLOFENAC SODIUM (VOLTAREN) 1 % GEL    Apply 4 g topically 4 (four) times daily.   HYDRALAZINE (APRESOLINE) 100 MG TABLET    Take 1 tablet (100 mg total) by mouth every 8 (eight) hours.   LOSARTAN (COZAAR) 50 MG TABLET    Take 1 tablet (50 mg total) by mouth daily.   RIVAROXABAN (XARELTO) 15 MG TABS TABLET    Take 1 tablet (15 mg total) by mouth daily with supper.   SERTRALINE (ZOLOFT) 100 MG TABLET    Take 1 tablet (100 mg total) by mouth daily.   VITAMIN D PO    Take 1 tablet by mouth daily.  Modified Medications   No medications on file  Discontinued Medications   No medications on file    Physical Exam:  Vitals:   02/05/23 0805  BP: 136/80  Pulse: 95  Temp: (!) 97.3 F (36.3 C)  TempSrc: Temporal  SpO2: 98%  Weight: 157 lb  (71.2 kg)  Height: '5\' 3"'$  (1.6 m)   Body mass index is 27.81 kg/m. Wt Readings from Last 3 Encounters:  02/05/23 157 lb (71.2 kg)  02/02/23 159 lb 9.8 oz (72.4 kg)  12/23/22 161 lb 9.6 oz (73.3 kg)    Physical Exam Vitals reviewed.  Constitutional:      General: She is not in acute distress.    Appearance: Normal appearance.  Cardiovascular:     Rate and Rhythm: Normal rate and regular rhythm.  Pulmonary:     Effort: No respiratory distress.     Breath sounds: Normal breath sounds.  Abdominal:     General: Bowel sounds are normal. There is  no distension.     Palpations: Abdomen is soft. There is no mass.     Tenderness: There is no abdominal tenderness. There is no guarding.  Musculoskeletal:     Cervical back: Neck supple.  Lymphadenopathy:     Cervical: No cervical adenopathy.  Skin:    General: Skin is warm and dry.  Neurological:     Mental Status: She is alert and oriented to person, place, and time.  Psychiatric:        Mood and Affect: Mood normal.     Labs reviewed: Basic Metabolic Panel: Recent Labs    01/31/23 0412 02/01/23 0310 02/02/23 0040  NA 135 133* 134*  K 3.9 3.7 4.5  CL 102 101 103  CO2 '26 24 25  '$ GLUCOSE 106* 104* 104*  BUN 17 28* 26*  CREATININE 1.13* 1.41* 1.17*  CALCIUM 10.5* 10.4* 10.4*  MG 2.3  --   --   PHOS 3.1  --   --    Liver Function Tests: Recent Labs    03/06/22 1500 09/28/22 0921 01/29/23 1921  AST '13 13 16  '$ ALT '8 6 10  '$ ALKPHOS  --   --  111  BILITOT 0.4 0.7 0.9  PROT 6.6 6.5 6.7  ALBUMIN  --   --  3.7   No results for input(s): "LIPASE", "AMYLASE" in the last 8760 hours. No results for input(s): "AMMONIA" in the last 8760 hours. CBC: Recent Labs    09/28/22 0921 01/29/23 1921 01/29/23 1926 01/30/23 0459 01/31/23 0412 02/01/23 0310  WBC 5.4 6.1  --  7.6 6.9 7.3  NEUTROABS 2,986 3.2  --  5.6  --   --   HGB 12.8 13.4   < > 13.9 14.0 13.0  HCT 38.8 41.1   < > 41.8 41.9 39.7  MCV 89.4 89.3  --  87.4 86.6  87.4  PLT 330 296  --  306 318 320   < > = values in this interval not displayed.   Lipid Panel: Recent Labs    03/06/22 1500 09/28/22 0921 01/31/23 0412  CHOL 210* 165  --   HDL 49* 50  --   LDLCALC 135* 98  --   TRIG 135 83 108  CHOLHDL 4.3 3.3  --    TSH: No results for input(s): "TSH" in the last 8760 hours. A1C: Lab Results  Component Value Date   HGBA1C 5.6 05/29/2020     Assessment/Plan 1. Essential hypertension, benign Discussed consistently taking medication, diet modifications, consequences of hypertension at length with patient. She has been taking her medications as prescribed since her hospitalization and very interested in how to better her diet today. She will continue to check her BP about 3 times a week once a day and let us know how it is doing next time we see her.   2. Paroxysmal atrial fibrillation ([place]) Discussed why she takes some of her medications including xarelto. Continue carvedilol and xarelto. Educated to Cut back on caffeine.  3. Dizziness Monitor. She is in agreement to cut back gradually on caffeine and increase her water intake. She agrees to eat more regularly and eat more protein.   4. Caregiver stress She cares for her husband who stays at home with hospice due to stage iv lung cancer. She is managing on the sertraline but can get stressed out sometimes because he is home alone during the day. Discussed taking time for herself to care for herself so she is in optimal shape to  take care of him.  5. Major depressive disorder, recurrent severe without psychotic features ([place]) -stable, continues on zoloft 100 mg daily  6. Aortic atherosclerosis ([place]) -continues on lipitor and xarelto.   7. Secondary hypercoagulable state ([place]) Due to a fib, continues on xarelto.    Return in about 3 months (around 05/08/2023) for routine follow up.  Student- Archer Asa O'Berry ACPCNP-S  I personally was present during the history, physical exam and medical  decision-making activities of this service and have verified that the service and findings are accurately documented in the student's note Taji Sather K. Lee [place], [facility] [phone]

## 2023-02-05 NOTE — Transitions of Care (Post Inpatient/ED Visit) (Signed)
   02/05/2023  Name: Erin Cunningham MRN: [id number] DOB: 10/30/1941  Today's TOC FU Call Status: complete Patient is already scheduled for a F/U with PCP for 02/04/2022    Attempted to reach the patient regarding the most recent Inpatient/ED visit.  Follow Up Plan: No further outreach attempts will be made at this time. We have been unable to contact the patient.  Signature [place].

## 2023-02-05 NOTE — Telephone Encounter (Signed)
Patient already seen by PCP

## 2023-02-09 ENCOUNTER — Telehealth: Payer: Self-pay

## 2023-02-09 NOTE — Telephone Encounter (Signed)
Patient called requesting a note to return to work. She wants to return to work either Monday or Tuesday. 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  Message routed to Sherrie Mustache, NP

## 2023-02-10 NOTE — Telephone Encounter (Signed)
Patient mad aware that letter is ready for pick up and any specific changes need to be discussed with employer.

## 2023-02-10 NOTE — Telephone Encounter (Signed)
Back to work note provided on what I was able to write- if she needs specific changes she needs to discuss this with her work.

## 2023-02-11 NOTE — Addendum Note (Signed)
Addended by: Lauree Chandler on: 02/11/2023 12:25 PM   Modules accepted: Level of Service

## 2023-03-01 ENCOUNTER — Telehealth: Payer: Self-pay

## 2023-03-01 NOTE — Telephone Encounter (Signed)
Patient called stating that when she taked her medication it makes her feel dizzy. She says it's her BP medication,but she doesn't know which one because she takes all her medications at one time. Please advise.   Message sent to Sherrie Mustache, NP

## 2023-03-01 NOTE — Telephone Encounter (Signed)
Tried calling patient back, unable to leave message due to mailbox being full.

## 2023-03-01 NOTE — Telephone Encounter (Signed)
What is her blood pressure and HR?

## 2023-03-02 NOTE — Telephone Encounter (Signed)
Patient called back and she states that her blood pressure was 158/94 and pulse was 94 yesterday. Message routed back to PCP Dewaine Oats, Carlos American, NP

## 2023-03-02 NOTE — Telephone Encounter (Signed)
Have her keep a record of blood pressure and pulse about 1 hour after she takes her medication and lets follow up in 1-2 weeks.  Make sure she is staying hydrated with the proper amount of water.

## 2023-03-02 NOTE — Telephone Encounter (Signed)
Patient called and notified. Appointments scheduled.

## 2023-03-15 ENCOUNTER — Encounter: Payer: Medicare Other | Admitting: Nurse Practitioner

## 2023-03-15 NOTE — Progress Notes (Signed)
error 

## 2023-03-18 ENCOUNTER — Encounter: Payer: BC Managed Care – PPO | Admitting: Nurse Practitioner

## 2023-03-18 NOTE — Progress Notes (Signed)
This encounter was created in error - please disregard.

## 2023-03-18 NOTE — Progress Notes (Signed)
  This service is provided via telemedicine  No vital signs collected/recorded due to the encounter was a telemedicine visit.   Location of patient (ex: home, work):  Work  Patient consents to a telephone visit:  Yes  Location of the provider (ex: office, home):  [facility]  Name of any referring provider:  na  Names of all persons participating in the telemedicine service and their role in the encounter:  Marikay Alar, Patient, Nelda Severe, CMA, Abbey Chatters, NP  Time spent on call:  8:25

## 2023-03-19 ENCOUNTER — Other Ambulatory Visit: Payer: Self-pay | Admitting: *Deleted

## 2023-03-19 ENCOUNTER — Encounter: Payer: Medicare Other | Admitting: Nurse Practitioner

## 2023-03-19 DIAGNOSIS — E785 Hyperlipidemia, unspecified: Secondary | ICD-10-CM

## 2023-03-19 MED ORDER — LOSARTAN POTASSIUM 50 MG PO TABS: 50.0000 mg | ORAL_TABLET | Freq: Every day | ORAL | 0 refills | Status: DC

## 2023-03-19 MED ORDER — HYDRALAZINE HCL 100 MG PO TABS: 100.0000 mg | ORAL_TABLET | Freq: Three times a day (TID) | ORAL | 0 refills | Status: DC

## 2023-03-19 MED ORDER — ATORVASTATIN CALCIUM 80 MG PO TABS: 80.0000 mg | ORAL_TABLET | Freq: Every day | ORAL | 0 refills | Status: DC

## 2023-03-19 MED ORDER — RIVAROXABAN 15 MG PO TABS: 15.0000 mg | ORAL_TABLET | Freq: Every day | ORAL | 0 refills | Status: DC

## 2023-03-19 MED ORDER — CARVEDILOL 3.125 MG PO TABS: 3.1250 mg | ORAL_TABLET | Freq: Two times a day (BID) | ORAL | 0 refills | Status: DC

## 2023-03-19 NOTE — Telephone Encounter (Signed)
Patient requested refills.

## 2023-03-19 NOTE — Telephone Encounter (Signed)
Patient requested refill

## 2023-04-27 ENCOUNTER — Other Ambulatory Visit: Payer: Self-pay | Admitting: Nurse Practitioner

## 2023-04-28 ENCOUNTER — Encounter (HOSPITAL_COMMUNITY): Payer: Self-pay | Admitting: Psychiatry

## 2023-04-28 ENCOUNTER — Telehealth (HOSPITAL_BASED_OUTPATIENT_CLINIC_OR_DEPARTMENT_OTHER): Payer: BC Managed Care – PPO | Admitting: Psychiatry

## 2023-04-28 VITALS — Wt 157.0 lb

## 2023-04-28 DIAGNOSIS — F419 Anxiety disorder, unspecified: Secondary | ICD-10-CM | POA: Diagnosis not present

## 2023-04-28 DIAGNOSIS — F332 Major depressive disorder, recurrent severe without psychotic features: Secondary | ICD-10-CM

## 2023-04-28 MED ORDER — SERTRALINE HCL 100 MG PO TABS: 100.0000 mg | ORAL_TABLET | Freq: Every day | ORAL | 0 refills | Status: DC

## 2023-04-28 NOTE — Progress Notes (Signed)
[facility] MD Virtual Progress Note   Patient Location: In Car Provider Location: Home Office  I connect with patient by telephone and verified that I am speaking with correct person by using two identifiers. I discussed the limitations of evaluation and management by telemedicine and the availability of in person appointments. I also discussed with the patient that there may be a patient responsible charge related to this service. The patient expressed understanding and agreed to proceed.  Erin Cunningham [id number] 82 y.o.  04/28/2023 3:49 PM  History of Present Illness:  Patient is evaluated by phone session.  She is doing better.  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  She reported ever being admitted to the hospital due to high blood pressure.  She reported noncompliant with outpatient medicine but realized not she has to take it because she had a hard time in the hospital.  She is taking Zoloft.  She denies any panic attack, crying spells or any feeling of hopelessness or worthlessness.  There is a level is good.  Her appetite is okay.  Her husband has chronic health issues and he is getting home health aide.  She tried to help him but sometimes if she feels it is Confrontational then she tried to avoid the conversation.  She denies any mania, hallucination.  Patient happy grandson is doing very well and going to start the high school very soon in few months.  She has no tremor or shakes or any EPS.  She would like to keep the current Zoloft.  She does not need any sleep medicine.  She is not taking the antihypertensive medication on a regular basis.    Past Psychiatric History: H/O depression and anxiety.  One inpatient at [facility] for suicidal thoughts.  H/O brief stay in OBS unit in 2016.  No h/o mania, psychosis, hallucination or suicidal attempt.    Outpatient Encounter Medications as of 04/28/2023  Medication Sig   atorvastatin  (LIPITOR) 80 MG tablet Take 1 tablet (80 mg total) by mouth daily.   carvedilol (COREG) 3.125 MG tablet Take 1 tablet (3.125 mg total) by mouth 2 (two) times daily with a meal.   diclofenac Sodium (VOLTAREN) 1 % GEL Apply 4 g topically 4 (four) times daily.   hydrALAZINE (APRESOLINE) 100 MG tablet Take 1 tablet (100 mg total) by mouth every 8 (eight) hours.   losartan (COZAAR) 50 MG tablet Take 1 tablet (50 mg total) by mouth daily.   sertraline (ZOLOFT) 100 MG tablet Take 1 tablet (100 mg total) by mouth daily.   VITAMIN D PO Take 1 tablet by mouth daily.   XARELTO 15 MG TABS tablet TAKE 1 TABLET BY MOUTH ONCE DAILY WITH SUPPER   No facility-administered encounter medications on file as of 04/28/2023.    Recent Results (from the past 2160 hour(s))  Protime-INR     Status: None   Collection Time: 01/29/23  7:21 PM  Result Value Ref Range   Prothrombin Time 13.3 11.4 - 15.2 seconds   INR 1.0 0.8 - 1.2    Comment: (NOTE) INR goal varies based on device and disease states. Performed at [facility], [address], [place], [place] [address]   APTT     Status: None   Collection Time: 01/29/23  7:21 PM  Result Value Ref Range   aPTT 36 24 - 36 seconds    Comment: Performed at [facility], [address] [place], [place] [address]  CBC     Status: None   Collection Time: 01/29/23  7:21 PM  Result Value Ref Range   WBC 6.1 4.0 - 10.5 K/uL   RBC 4.60 3.87 - 5.11 MIL/uL   Hemoglobin 13.4 12.0 - 15.0 g/dL   HCT 60.4 54.0 - 98.1 %   MCV 89.3 80.0 - 100.0 fL   MCH 29.1 26.0 - 34.0 pg   MCHC 32.6 30.0 - 36.0 g/dL   RDW 19.1 47.8 - 29.5 %   Platelets 296 150 - 400 K/uL   nRBC 0.0 0.0 - 0.2 %    Comment: Performed at [facility], [address], [place], [place] [address]  Differential     Status: None   Collection Time: 01/29/23  7:21 PM  Result Value Ref Range   Neutrophils Relative % 51 %   Neutro Abs 3.2 1.7 - 7.7 K/uL   Lymphocytes Relative 34 %   Lymphs Abs  2.0 0.7 - 4.0 K/uL   Monocytes Relative 9 %   Monocytes Absolute 0.5 0.1 - 1.0 K/uL   Eosinophils Relative 4 %   Eosinophils Absolute 0.3 0.0 - 0.5 K/uL   Basophils Relative 2 %   Basophils Absolute 0.1 0.0 - 0.1 K/uL   Immature Granulocytes 0 %   Abs Immature Granulocytes 0.01 0.00 - 0.07 K/uL    Comment: Performed at [facility], [address], [place], [place] [address]  Comprehensive metabolic panel     Status: Abnormal   Collection Time: 01/29/23  7:21 PM  Result Value Ref Range   Sodium 137 135 - 145 mmol/L   Potassium 4.1 3.5 - 5.1 mmol/L   Chloride 104 98 - 111 mmol/L   CO2 25 22 - 32 mmol/L   Glucose, Bld 86 70 - 99 mg/dL    Comment: Glucose reference range applies only to samples taken after fasting for at least 8 hours.   BUN 15 8 - 23 mg/dL   Creatinine, Ser 4.69 0.44 - 1.00 mg/dL   Calcium 62.9 (H) 8.9 - 10.3 mg/dL   Total Protein 6.7 6.5 - 8.1 g/dL   Albumin 3.7 3.5 - 5.0 g/dL   AST 16 15 - 41 U/L   ALT 10 0 - 44 U/L   Alkaline Phosphatase 111 38 - 126 U/L   Total Bilirubin 0.9 0.3 - 1.2 mg/dL   GFR, Estimated 59 (L) >60 mL/min    Comment: (NOTE) Calculated using the CKD-EPI Creatinine Equation (2021)    Anion gap 8 5 - 15    Comment: Performed at [facility], [address] [place], [place] [address]  Ethanol     Status: None   Collection Time: 01/29/23  7:21 PM  Result Value Ref Range   Alcohol, Ethyl (B) <10 <10 mg/dL    Comment: (NOTE) Lowest detectable limit for serum alcohol is 10 mg/dL.  For medical purposes only. Performed at [facility], [address] [place], [place] [address]   I-stat chem 8, ED     Status: None   Collection Time: 01/29/23  7:26 PM  Result Value Ref Range   Sodium 140 135 - 145 mmol/L   Potassium 4.5 3.5 - 5.1 mmol/L   Chloride 105 98 - 111 mmol/L   BUN 18 8 - 23 mg/dL   Creatinine, Ser 1.02 0.44 - 1.00 mg/dL   Glucose, Bld 84 70 - 99 mg/dL  Comment: Glucose reference range applies only to  samples taken after fasting for at least 8 hours.   Calcium, Ion 1.35 1.15 - 1.40 mmol/L   TCO2 26 22 - 32 mmol/L   Hemoglobin 13.9 12.0 - 15.0 g/dL   HCT 96.0 45.4 - 09.8 %  CBG monitoring, ED     Status: None   Collection Time: 01/29/23  7:35 PM  Result Value Ref Range   Glucose-Capillary 82 70 - 99 mg/dL    Comment: Glucose reference range applies only to samples taken after fasting for at least 8 hours.  Basic metabolic panel     Status: Abnormal   Collection Time: 01/30/23  4:59 AM  Result Value Ref Range   Sodium 136 135 - 145 mmol/L   Potassium 3.5 3.5 - 5.1 mmol/L   Chloride 102 98 - 111 mmol/L   CO2 24 22 - 32 mmol/L   Glucose, Bld 122 (H) 70 - 99 mg/dL    Comment: Glucose reference range applies only to samples taken after fasting for at least 8 hours.   BUN 11 8 - 23 mg/dL   Creatinine, Ser 1.19 0.44 - 1.00 mg/dL   Calcium 14.7 8.9 - 82.9 mg/dL   GFR, Estimated >56 >21 mL/min    Comment: (NOTE) Calculated using the CKD-EPI Creatinine Equation (2021)    Anion gap 10 5 - 15    Comment: Performed at [facility], [address] [place], [place] [address]  CBC with Differential/Platelet     Status: None   Collection Time: 01/30/23  4:59 AM  Result Value Ref Range   WBC 7.6 4.0 - 10.5 K/uL   RBC 4.78 3.87 - 5.11 MIL/uL   Hemoglobin 13.9 12.0 - 15.0 g/dL   HCT 78.4 69.6 - 29.5 %   MCV 87.4 80.0 - 100.0 fL   MCH 29.1 26.0 - 34.0 pg   MCHC 33.3 30.0 - 36.0 g/dL   RDW 28.4 13.2 - 44.0 %   Platelets 306 150 - 400 K/uL   nRBC 0.0 0.0 - 0.2 %   Neutrophils Relative % 74 %   Neutro Abs 5.6 1.7 - 7.7 K/uL   Lymphocytes Relative 16 %   Lymphs Abs 1.2 0.7 - 4.0 K/uL   Monocytes Relative 8 %   Monocytes Absolute 0.6 0.1 - 1.0 K/uL   Eosinophils Relative 1 %   Eosinophils Absolute 0.1 0.0 - 0.5 K/uL   Basophils Relative 1 %   Basophils Absolute 0.1 0.0 - 0.1 K/uL   Immature Granulocytes 0 %   Abs Immature Granulocytes 0.02 0.00 - 0.07 K/uL    Comment: Performed at  [facility], [address], [place], [place] [address]  MRSA Next Gen by PCR, Nasal     Status: None   Collection Time: 01/30/23  8:07 AM   Specimen: Nasal Mucosa; Nasal Swab  Result Value Ref Range   MRSA by PCR Next Gen NOT DETECTED NOT DETECTED    Comment: (NOTE) The GeneXpert MRSA Assay (FDA approved for NASAL specimens only), is one component of a comprehensive MRSA colonization surveillance program. It is not intended to diagnose MRSA infection nor to guide or monitor treatment for MRSA infections. Test performance is not FDA approved in patients less than 60 years old. Performed at [facility], [address] [place], [place] [address]   CBC     Status: None   Collection Time: 01/31/23  4:12 AM  Result Value Ref  Range   WBC 6.9 4.0 - 10.5 K/uL   RBC 4.84 3.87 - 5.11 MIL/uL   Hemoglobin 14.0 12.0 - 15.0 g/dL   HCT 81.1 91.4 - 78.2 %   MCV 86.6 80.0 - 100.0 fL   MCH 28.9 26.0 - 34.0 pg   MCHC 33.4 30.0 - 36.0 g/dL   RDW 95.6 21.3 - 08.6 %   Platelets 318 150 - 400 K/uL   nRBC 0.0 0.0 - 0.2 %    Comment: Performed at [facility], [address], [place], [place] [address]  Basic metabolic panel     Status: Abnormal   Collection Time: 01/31/23  4:12 AM  Result Value Ref Range   Sodium 135 135 - 145 mmol/L   Potassium 3.9 3.5 - 5.1 mmol/L   Chloride 102 98 - 111 mmol/L   CO2 26 22 - 32 mmol/L   Glucose, Bld 106 (H) 70 - 99 mg/dL    Comment: Glucose reference range applies only to samples taken after fasting for at least 8 hours.   BUN 17 8 - 23 mg/dL   Creatinine, Ser 9.62 (H) 0.44 - 1.00 mg/dL   Calcium 95.2 (H) 8.9 - 10.3 mg/dL   GFR, Estimated 49 (L) >60 mL/min    Comment: (NOTE) Calculated using the CKD-EPI Creatinine Equation (2021)    Anion gap 7 5 - 15    Comment: Performed at [facility], [address] [place], [place] [address]  Magnesium     Status: None   Collection Time: 01/31/23  4:12 AM  Result Value Ref Range    Magnesium 2.3 1.7 - 2.4 mg/dL    Comment: Performed at [facility], [address] [place], [place] [address]  Phosphorus     Status: None   Collection Time: 01/31/23  4:12 AM  Result Value Ref Range   Phosphorus 3.1 2.5 - 4.6 mg/dL    Comment: Performed at [facility], [address], [place], [place] [address]  Triglycerides     Status: None   Collection Time: 01/31/23  4:12 AM  Result Value Ref Range   Triglycerides 108 <150 mg/dL    Comment: Performed at [facility], [address], [place], [place] [address]  CBC     Status: None   Collection Time: 02/01/23  3:10 AM  Result Value Ref Range   WBC 7.3 4.0 - 10.5 K/uL   RBC 4.54 3.87 - 5.11 MIL/uL   Hemoglobin 13.0 12.0 - 15.0 g/dL   HCT 34.7 42.5 - 95.6 %   MCV 87.4 80.0 - 100.0 fL   MCH 28.6 26.0 - 34.0 pg   MCHC 32.7 30.0 - 36.0 g/dL   RDW 38.7 56.4 - 33.2 %   Platelets 320 150 - 400 K/uL   nRBC 0.0 0.0 - 0.2 %    Comment: Performed at [facility], [address] [place], [place] [address]  Basic metabolic panel     Status: Abnormal   Collection Time: 02/01/23  3:10 AM  Result Value Ref Range   Sodium 133 (L) 135 - 145 mmol/L   Potassium 3.7 3.5 - 5.1 mmol/L   Chloride 101 98 - 111 mmol/L   CO2 24 22 - 32 mmol/L   Glucose, Bld 104 (H) 70 - 99 mg/dL    Comment: Glucose reference range applies only to samples taken after fasting for at least 8 hours.   BUN 28 (H)  8 - 23 mg/dL   Creatinine, Ser 4.09 (H) 0.44 - 1.00 mg/dL   Calcium 81.1 (H) 8.9 - 10.3 mg/dL   GFR, Estimated 37 (L) >60 mL/min    Comment: (NOTE) Calculated using the CKD-EPI Creatinine Equation (2021)    Anion gap 8 5 - 15    Comment: Performed at [facility], [address] [place], [place] [address]  Basic metabolic panel     Status: Abnormal   Collection Time: 02/02/23 12:40 AM  Result Value Ref Range   Sodium 134 (L) 135 - 145 mmol/L   Potassium 4.5 3.5 - 5.1 mmol/L   Chloride 103 98 - 111 mmol/L   CO2 25 22 -  32 mmol/L   Glucose, Bld 104 (H) 70 - 99 mg/dL    Comment: Glucose reference range applies only to samples taken after fasting for at least 8 hours.   BUN 26 (H) 8 - 23 mg/dL   Creatinine, Ser 2.95 (H) 0.44 - 1.00 mg/dL   Calcium 62.1 (H) 8.9 - 10.3 mg/dL   GFR, Estimated 47 (L) >60 mL/min    Comment: (NOTE) Calculated using the CKD-EPI Creatinine Equation (2021)    Anion gap 6 5 - 15    Comment: Performed at [facility], [address], [place], [place] [address]     Psychiatric Specialty Exam: Physical Exam  Review of Systems  Weight 157 lb (71.2 kg).There is no height or weight on file to calculate BMI.  General Appearance: NA  Eye Contact:  NA  Speech:  Slow  Volume:  Normal  Mood:  Euthymic  Affect:  NA  Thought Process:  Goal Directed  Orientation:  Full (Time, Place, and Person)  Thought Content:  Logical  Suicidal Thoughts:  No  Homicidal Thoughts:  No  Memory:  Immediate;   Good Recent;   Good Remote;   Good  Judgement:  Fair  Insight:  Present  Psychomotor Activity:  NA  Concentration:  Concentration: Good and Attention Span: Good  Recall:  Good  Fund of Knowledge:  Good  Language:  Good  Akathisia:  No  Handed:  Right  AIMS (if indicated):     Assets:  Communication Skills Desire for Improvement Housing Talents/Skills Transportation  ADL's:  Intact  Cognition:  WNL  Sleep:  ok     Assessment/Plan: Major depressive disorder, recurrent severe without psychotic features (HCC) - Plan: sertraline (ZOLOFT) 100 MG tablet  Anxiety - Plan: sertraline (ZOLOFT) 100 MG tablet  Patient is stable on current medication.  Does not want to change the dose.  Continue Zoloft 100 mg daily.  Recommend to call us back if she has any question or any concern.  Follow-up in 3 months.   Follow Up Instructions:     I discussed the assessment and treatment plan with the patient. The patient was provided an opportunity to ask questions and all were answered. The  patient agreed with the plan and demonstrated an understanding of the instructions.   The patient was advised to call back or seek an in-person evaluation if the symptoms worsen or if the condition fails to improve as anticipated.    Collaboration of Care: Other provider involved in patient's care AEB notes are available in epic to review.  Patient/Guardian was advised Release of Information must be obtained prior to any record release in order to collaborate their care with an outside provider. Patient/Guardian was advised if they have not already done so to contact  the registration department to sign all necessary forms in order for [place] to release information regarding their care.   Consent: Patient/Guardian gives verbal consent for treatment and assignment of benefits for services provided during this visit. Patient/Guardian expressed understanding and agreed to proceed.     I provided 15 minutes of non face to face time during this encounter.  Note: This document was prepared by [employer] voice dictation technology and any errors that results from this process are unintentional.    Cleotis Nipper, MD 04/28/2023

## 2023-06-08 ENCOUNTER — Encounter: Payer: Self-pay | Admitting: Nurse Practitioner

## 2023-06-09 ENCOUNTER — Encounter: Payer: BC Managed Care – PPO | Admitting: Nurse Practitioner

## 2023-06-11 ENCOUNTER — Ambulatory Visit: Payer: Medicare Other | Admitting: Nurse Practitioner

## 2023-06-14 ENCOUNTER — Other Ambulatory Visit: Payer: Self-pay

## 2023-06-14 MED ORDER — RIVAROXABAN 15 MG PO TABS: 15.0000 mg | ORAL_TABLET | Freq: Every day | ORAL | 0 refills | Status: DC

## 2023-06-14 NOTE — Progress Notes (Signed)
No show

## 2023-06-29 ENCOUNTER — Other Ambulatory Visit: Payer: Self-pay

## 2023-06-29 MED ORDER — HYDRALAZINE HCL 100 MG PO TABS: 100.0000 mg | ORAL_TABLET | Freq: Three times a day (TID) | ORAL | 3 refills | Status: DC

## 2023-07-19 ENCOUNTER — Encounter: Payer: Self-pay | Admitting: Nurse Practitioner

## 2023-07-19 ENCOUNTER — Ambulatory Visit (INDEPENDENT_AMBULATORY_CARE_PROVIDER_SITE_OTHER): Payer: BC Managed Care – PPO | Admitting: Nurse Practitioner

## 2023-07-19 VITALS — BP 136/82 | HR 50 | Temp 97.3°F | Ht 63.0 in | Wt 162.0 lb

## 2023-07-19 DIAGNOSIS — I480 Paroxysmal atrial fibrillation: Secondary | ICD-10-CM | POA: Diagnosis not present

## 2023-07-19 DIAGNOSIS — F332 Major depressive disorder, recurrent severe without psychotic features: Secondary | ICD-10-CM | POA: Diagnosis not present

## 2023-07-19 DIAGNOSIS — D6869 Other thrombophilia: Secondary | ICD-10-CM | POA: Diagnosis not present

## 2023-07-19 DIAGNOSIS — I10 Essential (primary) hypertension: Secondary | ICD-10-CM

## 2023-07-19 DIAGNOSIS — E785 Hyperlipidemia, unspecified: Secondary | ICD-10-CM

## 2023-07-19 LAB — CBC WITH DIFFERENTIAL/PLATELET
Absolute Monocytes: 504 cells/uL (ref 200–950)
Basophils Absolute: 101 cells/uL (ref 0–200)
Basophils Relative: 1.9 %
Eosinophils Absolute: 223 cells/uL (ref 15–500)
Eosinophils Relative: 4.2 %
HCT: 40.4 % (ref 35.0–45.0)
Hemoglobin: 13 g/dL (ref 11.7–15.5)
Lymphs Abs: 1823 cells/uL (ref 850–3900)
MCH: 28.4 pg (ref 27.0–33.0)
MCHC: 32.2 g/dL (ref 32.0–36.0)
MCV: 88.4 fL (ref 80.0–100.0)
MPV: 11.7 fL (ref 7.5–12.5)
Monocytes Relative: 9.5 %
Neutro Abs: 2650 cells/uL (ref 1500–7800)
Neutrophils Relative %: 50 %
Platelets: 310 10*3/uL (ref 140–400)
RBC: 4.57 10*6/uL (ref 3.80–5.10)
RDW: 12.7 % (ref 11.0–15.0)
Total Lymphocyte: 34.4 %
WBC: 5.3 10*3/uL (ref 3.8–10.8)

## 2023-07-19 MED ORDER — LOSARTAN POTASSIUM 50 MG PO TABS: 50.0000 mg | ORAL_TABLET | Freq: Every day | ORAL | 3 refills | Status: DC

## 2023-07-19 MED ORDER — RIVAROXABAN 15 MG PO TABS: 15.0000 mg | ORAL_TABLET | Freq: Every day | ORAL | 3 refills | Status: DC

## 2023-07-19 NOTE — Progress Notes (Unsigned)
Careteam: Patient Care Team: Sharon Seller, NP as PCP - General (Geriatric Medicine) Drema Halon, MD (Inactive) as Consulting Physician (Otolaryngology) Jodelle Red, MD as Consulting Physician (Cardiology)  PLACE OF SERVICE:  [facility]  Advanced Directive information Does Patient Have a Medical Advance Directive?: No, Would patient like information on creating a medical advance directive?: Yes (MAU/Ambulatory/Procedural Areas - Information given) (Given at a previous visit)  No Known Allergies  Chief Complaint  Patient presents with   Medical Management of Chronic Issues    4 month follow-up and discuss grief related to loss of husband. Patient states losartan makes her dizzy at times.      HPI: Patient is a 82 y.o. female for follow up.  Her husband recently passed (June 20th) and she is still grieving his lost. They were together 47 years. Does not feel like she needs grief counseling.   She is trying to take care of herself.  She is staying busy at home. [place], painting.   She has to eat when she takes her medication.   Takes her xarelto in the evening.   She generally takes losartan in the evening because it makes her tired and dizzy. Improves side effects when taking in the evening.   A fib- no recent episodes, sometimes she misses her coreg in the morning.   Seeing behavioral health for her depression- doing well on zoloft.  Review of Systems:  Review of Systems  Constitutional:  Negative for chills, fever and weight loss.  HENT:  Negative for tinnitus.   Respiratory:  Negative for cough, sputum production and shortness of breath.   Cardiovascular:  Negative for chest pain, palpitations and leg swelling.  Gastrointestinal:  Negative for abdominal pain, constipation, diarrhea and heartburn.  Genitourinary:  Negative for dysuria, frequency and urgency.  Musculoskeletal:  Negative for back pain, falls, joint pain and myalgias.   Skin: Negative.   Neurological:  Negative for dizziness and headaches.  Psychiatric/Behavioral:  Negative for depression and memory loss. The patient does not have insomnia.   ***  Past Medical History:  Diagnosis Date   Atrial fibrillation (HCC)    Breast CA (HCC) 2006   right ([place])   Cancer (HCC)    Helicobacter pylori gastritis 2001   High blood pressure    History of adenomatous polyp of colon 09/23/2017   Oma 09/25/2017 and apparently polyps in the past as well though recalled age   History of shingles    Major depressive disorder 05/28/2020   Major depressive disorder, recurrent, severe without psychotic features (HCC) 06/24/2015   Parotid mass 2014   right benign cyst   Personal history of radiation therapy 2006   Rt breast   Stroke (cerebrum) (HCC) 06/28/2020   Per patient   Wears glasses    Past Surgical History:  Procedure Laterality Date   ABDOMINAL HYSTERECTOMY  1982   BSO   BREAST LUMPECTOMY Right 2007   BREAST LUMPECTOMY WITH SENTINEL LYMPH NODE BIOPSY  2005   right   BREAST SURGERY  2004   rt br mass   CESAREAN SECTION     COLONOSCOPY     ESOPHAGOGASTRODUODENOSCOPY  2001   H pylori gastritis   EYE SURGERY     both cataracts   PAROTIDECTOMY Right 09/05/2013   Procedure: RIGHT SUPERFICIAL PAROTIDECTOMY WITH FACIAL NERVE DISECTION;  Surgeon: Drema Halon, MD;  Location: [facility];  Service: ENT;  Laterality: Right;  All documentation done by R.Ward after  1610 --done under G. Garzon's password   RE-EXCISION OF BREAST LUMPECTOMY  2005   right   Social History:   reports that she has never smoked. Her smokeless tobacco use includes chew. She reports current alcohol use. She reports that she does not use drugs.  Family History  Problem Relation Age of Onset   Heart disease Mother        MI   Cancer Father        bone   Thyroid disease Neg Hx    Hypercalcemia Neg Hx    Colon cancer Neg Hx    Stomach cancer Neg Hx    Rectal  cancer Neg Hx    Esophageal cancer Neg Hx     Medications: Patient's Medications  New Prescriptions   No medications on file  Previous Medications   ATORVASTATIN (LIPITOR) 80 MG TABLET    Take 1 tablet (80 mg total) by mouth daily.   CARVEDILOL (COREG) 3.125 MG TABLET    Take 1 tablet (3.125 mg total) by mouth 2 (two) times daily with a meal.   DICLOFENAC SODIUM (VOLTAREN) 1 % GEL    Apply 4 g topically 4 (four) times daily.   HYDRALAZINE (APRESOLINE) 100 MG TABLET    Take 1 tablet (100 mg total) by mouth every 8 (eight) hours.   LOSARTAN (COZAAR) 50 MG TABLET    Take 1 tablet (50 mg total) by mouth daily.   RIVAROXABAN (XARELTO) 15 MG TABS TABLET    Take 1 tablet (15 mg total) by mouth daily with supper.   SERTRALINE (ZOLOFT) 100 MG TABLET    Take 1 tablet (100 mg total) by mouth daily.   VITAMIN D PO    Take 1 tablet by mouth daily.  Modified Medications   No medications on file  Discontinued Medications   No medications on file    Physical Exam:  Vitals:   07/19/23 1048  BP: 136/82  Pulse: (!) 50  Temp: (!) 97.3 F (36.3 C)  TempSrc: Temporal  SpO2: 98%  Weight: 162 lb (73.5 kg)  Height: 5\' 3"  (1.6 m)   Body mass index is 28.7 kg/m. Wt Readings from Last 3 Encounters:  07/19/23 162 lb (73.5 kg)  02/05/23 157 lb (71.2 kg)  02/02/23 159 lb 9.8 oz (72.4 kg)    Physical Exam***  Labs reviewed: Basic Metabolic Panel: Recent Labs    01/31/23 0412 02/01/23 0310 02/02/23 0040  NA 135 133* 134*  K 3.9 3.7 4.5  CL 102 101 103  CO2 26 24 25   GLUCOSE 106* 104* 104*  BUN 17 28* 26*  CREATININE 1.13* 1.41* 1.17*  CALCIUM 10.5* 10.4* 10.4*  MG 2.3  --   --   PHOS 3.1  --   --    Liver Function Tests: Recent Labs    09/28/22 0921 01/29/23 1921  AST 13 16  ALT 6 10  ALKPHOS  --  111  BILITOT 0.7 0.9  PROT 6.5 6.7  ALBUMIN  --  3.7   No results for input(s): "LIPASE", "AMYLASE" in the last 8760 hours. No results for input(s): "AMMONIA" in the last 8760  hours. CBC: Recent Labs    09/28/22 0921 01/29/23 1921 01/29/23 1926 01/30/23 0459 01/31/23 0412 02/01/23 0310  WBC 5.4 6.1  --  7.6 6.9 7.3  NEUTROABS 2,986 3.2  --  5.6  --   --   HGB 12.8 13.4   < > 13.9 14.0 13.0  HCT 38.8 41.1   < >  41.8 41.9 39.7  MCV 89.4 89.3  --  87.4 86.6 87.4  PLT 330 296  --  306 318 320   < > = values in this interval not displayed.   Lipid Panel: Recent Labs    09/28/22 0921 01/31/23 0412  CHOL 165  --   HDL 50  --   LDLCALC 98  --   TRIG 83 108  CHOLHDL 3.3  --    TSH: No results for input(s): "TSH" in the last 8760 hours. A1C: Lab Results  Component Value Date   HGBA1C 5.6 05/29/2020     Assessment/Plan There are no diagnoses linked to this encounter.  No follow-ups on file.: ***   K. Biagio Borg [facility] [phone]

## 2023-08-03 ENCOUNTER — Encounter (HOSPITAL_COMMUNITY): Payer: Self-pay

## 2023-08-03 ENCOUNTER — Telehealth (HOSPITAL_BASED_OUTPATIENT_CLINIC_OR_DEPARTMENT_OTHER): Payer: Self-pay | Admitting: Psychiatry

## 2023-08-03 DIAGNOSIS — Z91199 Patient's noncompliance with other medical treatment and regimen due to unspecified reason: Secondary | ICD-10-CM

## 2023-08-03 NOTE — Progress Notes (Signed)
No show

## 2023-08-05 ENCOUNTER — Telehealth (HOSPITAL_BASED_OUTPATIENT_CLINIC_OR_DEPARTMENT_OTHER): Payer: BC Managed Care – PPO | Admitting: Psychiatry

## 2023-08-05 ENCOUNTER — Encounter (HOSPITAL_COMMUNITY): Payer: Self-pay | Admitting: Psychiatry

## 2023-08-05 DIAGNOSIS — F419 Anxiety disorder, unspecified: Secondary | ICD-10-CM | POA: Diagnosis not present

## 2023-08-05 DIAGNOSIS — F332 Major depressive disorder, recurrent severe without psychotic features: Secondary | ICD-10-CM | POA: Diagnosis not present

## 2023-08-05 MED ORDER — SERTRALINE HCL 100 MG PO TABS: 100.0000 mg | ORAL_TABLET | Freq: Every day | ORAL | 0 refills | Status: AC

## 2023-08-05 NOTE — Progress Notes (Signed)
[facility] MD Virtual Progress Note   Patient Location: Home Provider Location: Home office  I connect with patient by telephone and verified that I am speaking with correct person by using two identifiers. I discussed the limitations of evaluation and management by telemedicine and the availability of in person appointments. I also discussed with the patient that there may be a patient responsible charge related to this service. The patient expressed understanding and agreed to proceed.  Erin Cunningham [id number] 82 y.o.  08/05/2023 4:24 PM  History of Present Illness:  Patient is evaluated by phone session.  She reported husband died in 05-22-23 due to lung cancer.  The beginning it was difficult and tough but now she is feeling better.  She started school and that has been very helpful.  His son and his wife also very supportive and his grandpa.  Lives with him.  He admitted some time dysphoria and sadness but did not feel going through grief because she had a difficult marriage.  She is trying to go out more frequently and try to keep herself busy.  She denies any hallucination, mania, suicidal thoughts.  She denies any agitation or any anger.  She has no tremor or shakes or any EPS.  She like to keep the current dose of Zoloft.  Her sleep is good but in the beginning she had some trouble but slowly and gradually is getting better.  Her appetite is okay.  Recently she had a blood work which is normal but she had gained a few pounds.  She denies drinking or using any illegal substances..  Past Psychiatric History: H/O depression and anxiety.  One inpatient at [facility] for suicidal thoughts.  H/O brief stay in OBS unit in 2016.  No h/o mania, psychosis, hallucination or suicidal attempt.    Outpatient Encounter Medications as of 08/05/2023  Medication Sig   atorvastatin (LIPITOR) 80 MG tablet Take 1 tablet (80 mg total) by mouth daily.   carvedilol (COREG) 3.125 MG tablet Take 1 tablet  (3.125 mg total) by mouth 2 (two) times daily with a meal.   diclofenac Sodium (VOLTAREN) 1 % GEL Apply 4 g topically 4 (four) times daily.   hydrALAZINE (APRESOLINE) 100 MG tablet Take 1 tablet (100 mg total) by mouth every 8 (eight) hours.   losartan (COZAAR) 50 MG tablet Take 1 tablet (50 mg total) by mouth daily.   Rivaroxaban (XARELTO) 15 MG TABS tablet Take 1 tablet (15 mg total) by mouth daily with supper.   sertraline (ZOLOFT) 100 MG tablet Take 1 tablet (100 mg total) by mouth daily.   VITAMIN D PO Take 1 tablet by mouth daily.   No facility-administered encounter medications on file as of 08/05/2023.    Recent Results (from the past 2160 hour(s))  Lipid panel     Status: Abnormal   Collection Time: 07/19/23 11:15 AM  Result Value Ref Range   Cholesterol 130 <200 mg/dL   HDL 49 (L) > OR = 50 mg/dL   Triglycerides 76 <409 mg/dL   LDL Cholesterol (Calc) 65 mg/dL (calc)    Comment: Reference range: <100 . Desirable range <100 mg/dL for primary prevention;   <70 mg/dL for patients with CHD or diabetic patients  with > or = 2 CHD risk factors. Marland Kitchen LDL-C is now calculated using the Martin-Hopkins  calculation, which is a validated novel method providing  better accuracy than the Friedewald equation in the  estimation of LDL-C.  Horald Pollen et al.  JAMA. 9147;829(56): [phone]  (http://education.[facility].com/faq/FAQ164)    Total CHOL/HDL Ratio 2.7 <5.0 (calc)   Non-HDL Cholesterol (Calc) 81 <846 mg/dL (calc)    Comment: For patients with diabetes plus 1 major ASCVD risk  factor, treating to a non-HDL-C goal of <100 mg/dL  (LDL-C of <96 mg/dL) is considered a therapeutic  option.   COMPLETE METABOLIC PANEL WITH GFR     Status: None   Collection Time: 07/19/23 11:15 AM  Result Value Ref Range   Glucose, Bld 100 65 - 139 mg/dL    Comment: .        Non-fasting reference interval .    BUN 13 7 - 25 mg/dL   Creat 2.95 2.84 - 1.32 mg/dL   eGFR 67 > OR = 60 GM/WNU/2.72Z3    BUN/Creatinine Ratio SEE NOTE: 6 - 22 (calc)    Comment:    Not Reported: BUN and Creatinine are within    reference range. .    Sodium 141 135 - 146 mmol/L   Potassium 3.8 3.5 - 5.3 mmol/L   Chloride 110 98 - 110 mmol/L   CO2 24 20 - 32 mmol/L   Calcium 10.1 8.6 - 10.4 mg/dL   Total Protein 6.2 6.1 - 8.1 g/dL   Albumin 3.8 3.6 - 5.1 g/dL   Globulin 2.4 1.9 - 3.7 g/dL (calc)   AG Ratio 1.6 1.0 - 2.5 (calc)   Total Bilirubin 0.5 0.2 - 1.2 mg/dL   Alkaline phosphatase (APISO) 119 37 - 153 U/L   AST 14 10 - 35 U/L   ALT 10 6 - 29 U/L  CBC with Differential/Platelet     Status: None   Collection Time: 07/19/23 11:15 AM  Result Value Ref Range   WBC 5.3 3.8 - 10.8 Thousand/uL   RBC 4.57 3.80 - 5.10 Million/uL   Hemoglobin 13.0 11.7 - 15.5 g/dL   HCT 66.4 40.3 - 47.4 %   MCV 88.4 80.0 - 100.0 fL   MCH 28.4 27.0 - 33.0 pg   MCHC 32.2 32.0 - 36.0 g/dL   RDW 25.9 56.3 - 87.5 %   Platelets 310 140 - 400 Thousand/uL   MPV 11.7 7.5 - 12.5 fL   Neutro Abs 2,650 1,500 - 7,800 cells/uL   Lymphs Abs 1,823 850 - 3,900 cells/uL   Absolute Monocytes 504 200 - 950 cells/uL   Eosinophils Absolute 223 15 - 500 cells/uL   Basophils Absolute 101 0 - 200 cells/uL   Neutrophils Relative % 50 %   Total Lymphocyte 34.4 %   Monocytes Relative 9.5 %   Eosinophils Relative 4.2 %   Basophils Relative 1.9 %     Psychiatric Specialty Exam: Physical Exam  Review of Systems  Weight 162 lb (73.5 kg).There is no height or weight on file to calculate BMI.  General Appearance: NA  Eye Contact:  NA  Speech:  Slow  Volume:  Decreased  Mood:  Dysphoric  Affect:  Appropriate  Thought Process:  Goal Directed  Orientation:  Full (Time, Place, and Person)  Thought Content:  Rumination  Suicidal Thoughts:  No  Homicidal Thoughts:  No  Memory:  Immediate;   Good Recent;   Good Remote;   Good  Judgement:  Intact  Insight:  Present  Psychomotor Activity:  NA  Concentration:  Concentration: Good and  Attention Span: Good  Recall:  Good  Fund of Knowledge:  Good  Language:  Good  Akathisia:  No  Handed:  Right  AIMS (if indicated):  Assets:  Communication Skills Desire for Improvement Housing Social Support Talents/Skills Transportation  ADL's:  Intact  Cognition:  WNL  Sleep:  fair     Assessment/Plan: Anxiety - Plan: sertraline (ZOLOFT) 100 MG tablet  Major depressive disorder, recurrent severe without psychotic features (HCC) - Plan: sertraline (ZOLOFT) 100 MG tablet  Patient husband died in 05/13/2023 and in the beginning it was difficult but now slowly and gradually she is doing better.  I offered grief counseling but she does not feel she needs therapy as she recall her marriage was very difficult.  She had a good support from her son, his wife and her grandson is also living with patient.  She started school recently and that has been very helpful in keeping her busy.  She does not want to change the medication.  Continue Zoloft 100 mg daily.  Recommend to call us back if she is any question or any concern.  Follow-up in 3 months   Follow Up Instructions:     I discussed the assessment and treatment plan with the patient. The patient was provided an opportunity to ask questions and all were answered. The patient agreed with the plan and demonstrated an understanding of the instructions.   The patient was advised to call back or seek an in-person evaluation if the symptoms worsen or if the condition fails to improve as anticipated.    Collaboration of Care: Other provider involved in patient's care AEB notes are available in epic to review.  Patient/Guardian was advised Release of Information must be obtained prior to any record release in order to collaborate their care with an outside provider. Patient/Guardian was advised if they have not already done so to contact the registration department to sign all necessary forms in order for [place] to release information regarding  their care.   Consent: Patient/Guardian gives verbal consent for treatment and assignment of benefits for services provided during this visit. Patient/Guardian expressed understanding and agreed to proceed.     I provided 18 minutes of non face to face time during this encounter.  Note: This document was prepared by [employer] voice dictation technology and any errors that results from this process are unintentional.    Cleotis Nipper, MD 08/05/2023

## 2023-08-06 ENCOUNTER — Encounter (HOSPITAL_BASED_OUTPATIENT_CLINIC_OR_DEPARTMENT_OTHER): Payer: Self-pay

## 2023-10-05 ENCOUNTER — Encounter: Payer: Self-pay | Admitting: Adult Health

## 2023-10-06 ENCOUNTER — Ambulatory Visit: Payer: BC Managed Care – PPO | Admitting: Adult Health

## 2023-10-06 ENCOUNTER — Encounter: Payer: Self-pay | Admitting: Adult Health

## 2023-10-06 ENCOUNTER — Ambulatory Visit (INDEPENDENT_AMBULATORY_CARE_PROVIDER_SITE_OTHER): Payer: BC Managed Care – PPO | Admitting: Adult Health

## 2023-10-06 ENCOUNTER — Telehealth: Payer: Self-pay

## 2023-10-06 VITALS — BP 132/80 | HR 75 | Temp 95.6°F | Resp 18 | Ht 63.0 in | Wt 164.8 lb

## 2023-10-06 DIAGNOSIS — Z23 Encounter for immunization: Secondary | ICD-10-CM | POA: Diagnosis not present

## 2023-10-06 DIAGNOSIS — M79641 Pain in right hand: Secondary | ICD-10-CM | POA: Diagnosis not present

## 2023-10-06 DIAGNOSIS — I480 Paroxysmal atrial fibrillation: Secondary | ICD-10-CM | POA: Diagnosis not present

## 2023-10-06 DIAGNOSIS — I10 Essential (primary) hypertension: Secondary | ICD-10-CM | POA: Diagnosis not present

## 2023-10-06 LAB — BASIC METABOLIC PANEL WITH GFR
BUN: 13 mg/dL (ref 7–25)
CO2: 27 mmol/L (ref 20–32)
Calcium: 10.4 mg/dL (ref 8.6–10.4)
Chloride: 109 mmol/L (ref 98–110)
Creat: 0.83 mg/dL (ref 0.60–0.95)
Glucose, Bld: 87 mg/dL (ref 65–139)
Potassium: 3.8 mmol/L (ref 3.5–5.3)
Sodium: 141 mmol/L (ref 135–146)
eGFR: 71 mL/min/{1.73_m2} (ref >=60)

## 2023-10-06 LAB — URIC ACID: Uric Acid, Serum: 3.8 mg/dL (ref 2.5–7.0)

## 2023-10-06 MED ORDER — DICLOFENAC SODIUM 1 % EX GEL: 2.0000 g | Freq: Four times a day (QID) | CUTANEOUS | 3 refills | Status: DC | PRN

## 2023-10-06 MED ORDER — PREDNISONE 20 MG PO TABS: ORAL_TABLET | ORAL | 0 refills | Status: AC

## 2023-10-06 NOTE — Progress Notes (Signed)
[facility] clinic  Provider:  Kenard Gower DNP  Code Status:  Full Code  Goals of Care:     07/19/2023   10:50 AM  Advanced Directives  Does Patient Have a Medical Advance Directive? No  Would patient like information on creating a medical advance directive? Yes (MAU/Ambulatory/Procedural Areas - Information given)     Chief Complaint  Patient presents with   Acute Visit    Right hand pain and discuss flu vaccine.    HPI: Patient is a 82 y.o. female seen today for an acute visit for right hand pain.  Her pain started 2 days ago.  She denies trauma.  She stated that she has been eating bread, not much cooking since husband died last 2023-05-19.  Right hand with 1+ edema and tender to touch.  Past Medical History:  Diagnosis Date   Atrial fibrillation (HCC)    Breast CA (HCC) 2006   right ([place])   Cancer (HCC)    Helicobacter pylori gastritis 2001   High blood pressure    History of adenomatous polyp of colon 09/23/2017   Oma 09/25/2017 and apparently polyps in the past as well though recalled age   History of shingles    Major depressive disorder 05/28/2020   Major depressive disorder, recurrent, severe without psychotic features (HCC) 06/24/2015   Parotid mass 2014   right benign cyst   Personal history of radiation therapy 2006   Rt breast   Stroke (cerebrum) (HCC) 06/28/2020   Per patient   Wears glasses     Past Surgical History:  Procedure Laterality Date   ABDOMINAL HYSTERECTOMY  1982   BSO   BREAST LUMPECTOMY Right 2007   BREAST LUMPECTOMY WITH SENTINEL LYMPH NODE BIOPSY  2005   right   BREAST SURGERY  2004   rt br mass   CESAREAN SECTION     COLONOSCOPY     ESOPHAGOGASTRODUODENOSCOPY  2001   H pylori gastritis   EYE SURGERY     both cataracts   PAROTIDECTOMY Right 09/05/2013   Procedure: RIGHT SUPERFICIAL PAROTIDECTOMY WITH FACIAL NERVE DISECTION;  Surgeon: Drema Halon, MD;  Location: [facility];  Service: ENT;   Laterality: Right;  All documentation done by R.Ward after [phone] --done under G. Garzon's password   RE-EXCISION OF BREAST LUMPECTOMY  2005   right    No Known Allergies  Outpatient Encounter Medications as of 10/06/2023  Medication Sig   atorvastatin (LIPITOR) 80 MG tablet Take 1 tablet (80 mg total) by mouth daily.   carvedilol (COREG) 3.125 MG tablet Take 1 tablet (3.125 mg total) by mouth 2 (two) times daily with a meal.   hydrALAZINE (APRESOLINE) 100 MG tablet Take 1 tablet (100 mg total) by mouth every 8 (eight) hours.   losartan (COZAAR) 50 MG tablet Take 1 tablet (50 mg total) by mouth daily.   predniSONE (DELTASONE) 20 MG tablet Take 3 tablets (60 mg total) by mouth daily with breakfast for 3 days, THEN 2 tablets (40 mg total) daily with breakfast for 3 days, THEN 1 tablet (20 mg total) daily with breakfast for 3 days.   Rivaroxaban (XARELTO) 15 MG TABS tablet Take 1 tablet (15 mg total) by mouth daily with supper.   sertraline (ZOLOFT) 100 MG tablet Take 1 tablet (100 mg total) by mouth daily.   VITAMIN D PO Take 1 tablet by mouth daily.   [DISCONTINUED] diclofenac Sodium (VOLTAREN) 1 % GEL Apply 4 g topically 4 (four) times  daily.   diclofenac Sodium (VOLTAREN) 1 % GEL Apply 2 g topically 4 (four) times daily as needed.   No facility-administered encounter medications on file as of 10/06/2023.    Review of Systems:  Review of Systems  Constitutional:  Negative for appetite change, chills, fatigue and fever.  HENT:  Negative for congestion, hearing loss, rhinorrhea and sore throat.   Eyes: Negative.   Respiratory:  Negative for cough, shortness of breath and wheezing.   Cardiovascular:  Negative for chest pain, palpitations and leg swelling.  Gastrointestinal:  Negative for abdominal pain, constipation, diarrhea, nausea and vomiting.  Genitourinary:  Negative for dysuria.  Musculoskeletal:  Positive for joint swelling. Negative for arthralgias, back pain and myalgias.        Right hand painful  Skin:  Negative for color change, rash and wound.  Neurological:  Negative for dizziness, weakness and headaches.  Psychiatric/Behavioral:  Positive for sleep disturbance. Negative for behavioral problems. The patient is not nervous/anxious.     Health Maintenance  Topic Date Due   DTaP/Tdap/Td (3 - Td or Tdap) 01/13/2032   Pneumonia Vaccine 3+ Years old  Completed   INFLUENZA VACCINE  Completed   DEXA SCAN  Completed   Zoster Vaccines- Shingrix  Completed   HPV VACCINES  Aged Out   COVID-19 Vaccine  Discontinued   Hepatitis C Screening  Discontinued    Physical Exam: Vitals:   10/06/23 0919  BP: 132/80  Pulse: 75  Resp: 18  Temp: (!) 95.6 F (35.3 C)  SpO2: 96%  Weight: 164 lb 12.8 oz (74.8 kg)  Height: 5\' 3"  (1.6 m)   Body mass index is 29.19 kg/m. Physical Exam Constitutional:      General: She is not in acute distress.    Appearance: Normal appearance.  HENT:     Head: Normocephalic and atraumatic.     Nose: Nose normal.     Mouth/Throat:     Mouth: Mucous membranes are moist.  Eyes:     Conjunctiva/sclera: Conjunctivae normal.  Cardiovascular:     Rate and Rhythm: Normal rate and regular rhythm.  Pulmonary:     Effort: Pulmonary effort is normal.     Breath sounds: Normal breath sounds.  Abdominal:     General: Bowel sounds are normal.     Palpations: Abdomen is soft.  Musculoskeletal:        General: Swelling present.     Cervical back: Normal range of motion.     Comments: Right hand edema, limited movement due to tenderness  Skin:    General: Skin is warm and dry.  Neurological:     General: No focal deficit present.     Mental Status: She is alert and oriented to person, place, and time.  Psychiatric:        Mood and Affect: Mood normal.        Behavior: Behavior normal.        Thought Content: Thought content normal.        Judgment: Judgment normal.     Labs reviewed: Basic Metabolic Panel: Recent Labs     01/31/23 0412 02/01/23 0310 02/02/23 0040 07/19/23 1115 10/06/23 0940  NA 135   < > 134* 141 141  K 3.9   < > 4.5 3.8 3.8  CL 102   < > 103 110 109  CO2 26   < > 25 24 27   GLUCOSE 106*   < > 104* 100 87  BUN 17   < >  26* 13 13  CREATININE 1.13*   < > 1.17* 0.87 0.83  CALCIUM 10.5*   < > 10.4* 10.1 10.4  MG 2.3  --   --   --   --   PHOS 3.1  --   --   --   --    < > = values in this interval not displayed.   Liver Function Tests: Recent Labs    01/29/23 1921 07/19/23 1115  AST 16 14  ALT 10 10  ALKPHOS 111  --   BILITOT 0.9 0.5  PROT 6.7 6.2  ALBUMIN 3.7  --    No results for input(s): "LIPASE", "AMYLASE" in the last 8760 hours. No results for input(s): "AMMONIA" in the last 8760 hours. CBC: Recent Labs    01/29/23 1921 01/29/23 1926 01/30/23 0459 01/31/23 0412 02/01/23 0310 07/19/23 1115  WBC 6.1  --  7.6 6.9 7.3 5.3  NEUTROABS 3.2  --  5.6  --   --  2,650  HGB 13.4   < > 13.9 14.0 13.0 13.0  HCT 41.1   < > 41.8 41.9 39.7 40.4  MCV 89.3  --  87.4 86.6 87.4 88.4  PLT 296  --  306 318 320 310   < > = values in this interval not displayed.   Lipid Panel: Recent Labs    01/31/23 0412 07/19/23 1115  CHOL  --  130  HDL  --  49*  LDLCALC  --  65  TRIG 108 76  CHOLHDL  --  2.7   Lab Results  Component Value Date   HGBA1C 5.6 05/29/2020    Procedures since last visit: No results found.  Assessment/Plan  1. Right hand pain -  possible osteoarthritis or gout - predniSONE (DELTASONE) 20 MG tablet; Take 3 tablets (60 mg total) by mouth daily with breakfast for 3 days, THEN 2 tablets (40 mg total) daily with breakfast for 3 days, THEN 1 tablet (20 mg total) daily with breakfast for 3 days.  Dispense: 18 tablet; Refill: 0 - diclofenac Sodium (VOLTAREN) 1 % GEL; Apply 2 g topically 4 (four) times daily as needed.  Dispense: 150 g; Refill: 3 - Uric acid - Basic Metabolic Panel with eGFR  2. Need for influenza vaccination - Flu Vaccine Trivalent High Dose  (Fluad)  3. Essential hypertension, benign -  BP 132/80, stable -   Continue losartan, hydralazine and carvedilol  4. Paroxysmal atrial fibrillation (HCC) -   Rate controlled -   Continue carvedilol and Xarelto   Labs/tests ordered:  BMP and uric acid   Next appt:  01/21/2024

## 2023-10-06 NOTE — Telephone Encounter (Signed)
Incoming fax received from patients pharmacy to initiate a prior authorization for                   .  PA initiated through covermymeds. Key: [facility]  Rx APPROVED, I called the pharmacy and left a detailed message.

## 2023-10-06 NOTE — Patient Instructions (Signed)

## 2023-10-08 ENCOUNTER — Encounter: Payer: BC Managed Care – PPO | Admitting: Nurse Practitioner

## 2023-10-08 NOTE — Progress Notes (Signed)
This encounter was created in error - please disregard.

## 2023-10-12 NOTE — Progress Notes (Signed)
-    Uric acid, electrolytes, all normal

## 2023-11-01 ENCOUNTER — Telehealth (HOSPITAL_BASED_OUTPATIENT_CLINIC_OR_DEPARTMENT_OTHER): Payer: Self-pay | Admitting: Psychiatry

## 2023-11-01 ENCOUNTER — Encounter (HOSPITAL_COMMUNITY): Payer: Self-pay

## 2023-11-01 DIAGNOSIS — Z91199 Patient's noncompliance with other medical treatment and regimen due to unspecified reason: Secondary | ICD-10-CM

## 2023-11-01 NOTE — Progress Notes (Signed)
Patient is no show today.  I left a message to call us back

## 2024-01-21 ENCOUNTER — Ambulatory Visit: Payer: Medicare Other | Admitting: Nurse Practitioner

## 2024-01-24 ENCOUNTER — Encounter: Payer: Self-pay | Admitting: Nurse Practitioner

## 2024-01-24 ENCOUNTER — Ambulatory Visit (INDEPENDENT_AMBULATORY_CARE_PROVIDER_SITE_OTHER): Payer: Medicare Other | Admitting: Nurse Practitioner

## 2024-01-24 ENCOUNTER — Ambulatory Visit
Admission: RE | Admit: 2024-01-24 | Discharge: 2024-01-24 | Disposition: A | Discharge status: Home / Self Care | Payer: Self-pay | Source: Ambulatory Visit | Attending: Nurse Practitioner | Admitting: Nurse Practitioner

## 2024-01-24 VITALS — BP 130/82 | HR 82 | Temp 98.2°F | Resp 17 | Ht 63.0 in | Wt 160.8 lb

## 2024-01-24 DIAGNOSIS — M79641 Pain in right hand: Secondary | ICD-10-CM

## 2024-01-24 DIAGNOSIS — I10 Essential (primary) hypertension: Secondary | ICD-10-CM

## 2024-01-24 DIAGNOSIS — I700 Atherosclerosis of aorta: Secondary | ICD-10-CM | POA: Diagnosis not present

## 2024-01-24 DIAGNOSIS — Z8673 Personal history of transient ischemic attack (TIA), and cerebral infarction without residual deficits: Secondary | ICD-10-CM

## 2024-01-24 DIAGNOSIS — R490 Dysphonia: Secondary | ICD-10-CM

## 2024-01-24 DIAGNOSIS — J309 Allergic rhinitis, unspecified: Secondary | ICD-10-CM

## 2024-01-24 DIAGNOSIS — I480 Paroxysmal atrial fibrillation: Secondary | ICD-10-CM | POA: Diagnosis not present

## 2024-01-24 DIAGNOSIS — F332 Major depressive disorder, recurrent severe without psychotic features: Secondary | ICD-10-CM

## 2024-01-24 DIAGNOSIS — E785 Hyperlipidemia, unspecified: Secondary | ICD-10-CM

## 2024-01-24 DIAGNOSIS — I8393 Asymptomatic varicose veins of bilateral lower extremities: Secondary | ICD-10-CM

## 2024-01-24 NOTE — Progress Notes (Signed)
Careteam: Patient Care Team: Sharon Seller, NP as PCP - General (Geriatric Medicine) Jodelle Red, MD as Consulting Physician (Cardiology)  PLACE OF SERVICE:  [facility]  Advanced Directive information    No Known Allergies  Chief Complaint  Patient presents with   Medical Management of Chronic Issues    6 month follow up     HPI: Patient is a 83 y.o. female who presents today for her 6 month follow up. She has a history of hypertension, paroxysmal atrial fibrillation, aortic atherosclerosis, allergic rhinitis, major depressive disorder, hyperlipidemia, and history of CVA.  She is complaining of hoarseness and a dry cough which started 2 days ago. She has been doing salt water gargles gargles, and throat lozenges for relief.   She mentions that she occasionally loses her balance and is able to correct it immediately. She denies any falls and declines any PT   She is also experiencing pain to her right hand on the plantar side under her middle finger. She has limited ROM to her right hand.  She states she has been self treating what she believes is a ringworm infection on bilateral thighs for several months with no improvement.  She also complains of itching to her right lower leg.  She states she is doing better since the passing of her husband June 2024. 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    Review of Systems:  Review of Systems  Constitutional: Negative.   HENT: Negative.    Eyes: Negative.   Respiratory:  Positive for cough.   Cardiovascular: Negative.   Gastrointestinal: Negative.   Genitourinary: Negative.   Musculoskeletal:  Positive for joint pain and myalgias.       Right hand pain  Skin:  Positive for itching.       Posterior right lower leg  Neurological:  Positive for dizziness.       Occasional loss of balance  Endo/Heme/Allergies: Negative.   Psychiatric/Behavioral: Negative.      Past  Medical History:  Diagnosis Date   Atrial fibrillation (HCC)    Breast CA (HCC) 2006   right ([place])   Cancer (HCC)    Helicobacter pylori gastritis 2001   High blood pressure    History of adenomatous polyp of colon 09/23/2017   Oma 09/25/2017 and apparently polyps in the past as well though recalled age   History of shingles    Major depressive disorder 05/28/2020   Major depressive disorder, recurrent, severe without psychotic features (HCC) 06/24/2015   Parotid mass 2014   right benign cyst   Personal history of radiation therapy 2006   Rt breast   Stroke (cerebrum) (HCC) 06/28/2020   Per patient   Wears glasses    Past Surgical History:  Procedure Laterality Date   ABDOMINAL HYSTERECTOMY  1982   BSO   BREAST LUMPECTOMY Right 2007   BREAST LUMPECTOMY WITH SENTINEL LYMPH NODE BIOPSY  2005   right   BREAST SURGERY  2004   rt br mass   CESAREAN SECTION     COLONOSCOPY     ESOPHAGOGASTRODUODENOSCOPY  2001   H pylori gastritis   EYE SURGERY     both cataracts   PAROTIDECTOMY Right 09/05/2013   Procedure: RIGHT SUPERFICIAL PAROTIDECTOMY WITH FACIAL NERVE DISECTION;  Surgeon: Drema Halon, MD;  Location: [facility];  Service: ENT;  Laterality: Right;  All documentation done by R.Ward after [phone] --done under G. Garzon's  password   RE-EXCISION OF BREAST LUMPECTOMY  2005   right   Social History:   reports that she has never smoked. Her smokeless tobacco use includes chew. She reports current alcohol use. She reports that she does not use drugs.  Family History  Problem Relation Age of Onset   Heart disease Mother        MI   Cancer Father        bone   Thyroid disease Neg Hx    Hypercalcemia Neg Hx    Colon cancer Neg Hx    Stomach cancer Neg Hx    Rectal cancer Neg Hx    Esophageal cancer Neg Hx     Medications: Patient's Medications  New Prescriptions   No medications on file  Previous Medications   ATORVASTATIN (LIPITOR) 80 MG TABLET     Take 1 tablet (80 mg total) by mouth daily.   CARVEDILOL (COREG) 3.125 MG TABLET    Take 1 tablet (3.125 mg total) by mouth 2 (two) times daily with a meal.   DICLOFENAC SODIUM (VOLTAREN) 1 % GEL    Apply 2 g topically 4 (four) times daily as needed.   HYDRALAZINE (APRESOLINE) 100 MG TABLET    Take 1 tablet (100 mg total) by mouth every 8 (eight) hours.   LOSARTAN (COZAAR) 50 MG TABLET    Take 1 tablet (50 mg total) by mouth daily.   RIVAROXABAN (XARELTO) 15 MG TABS TABLET    Take 1 tablet (15 mg total) by mouth daily with supper.   SERTRALINE (ZOLOFT) 100 MG TABLET    Take 1 tablet (100 mg total) by mouth daily.   VITAMIN D PO    Take 1 tablet by mouth daily.  Modified Medications   No medications on file  Discontinued Medications   No medications on file    Physical Exam:  There were no vitals filed for this visit. There is no height or weight on file to calculate BMI. Wt Readings from Last 3 Encounters:  10/06/23 164 lb 12.8 oz (74.8 kg)  07/19/23 162 lb (73.5 kg)  02/05/23 157 lb (71.2 kg)    Physical Exam Vitals reviewed.  Constitutional:      Appearance: Normal appearance.  HENT:     Head: Normocephalic and atraumatic.     Right Ear: Tympanic membrane, ear canal and external ear normal.     Left Ear: Tympanic membrane, ear canal and external ear normal.     Nose: Nose normal.     Mouth/Throat:     Mouth: Mucous membranes are moist.     Pharynx: Oropharynx is clear.  Eyes:     Conjunctiva/sclera: Conjunctivae normal.  Cardiovascular:     Rate and Rhythm: Normal rate and regular rhythm.     Pulses: Normal pulses.     Heart sounds: Normal heart sounds.  Pulmonary:     Effort: Pulmonary effort is normal.     Breath sounds: Normal breath sounds.  Abdominal:     General: Bowel sounds are normal.     Palpations: Abdomen is soft.  Musculoskeletal:        General: Normal range of motion.     Right hand: Bony tenderness present. Normal strength.     Cervical back:  Normal range of motion and neck supple.     Comments: Pain and limited movement to plantar side under middle finger  Skin:    General: Skin is warm and dry.     Capillary Refill:  Capillary refill takes less than 2 seconds.  Neurological:     General: No focal deficit present.     Mental Status: She is alert and oriented to person, place, and time. Mental status is at baseline.  Psychiatric:        Mood and Affect: Mood normal.        Behavior: Behavior normal.     Labs reviewed: Basic Metabolic Panel: Recent Labs    01/31/23 0412 02/01/23 0310 02/02/23 0040 07/19/23 1115 10/06/23 0940  NA 135   < > 134* 141 141  K 3.9   < > 4.5 3.8 3.8  CL 102   < > 103 110 109  CO2 26   < > 25 24 27   GLUCOSE 106*   < > 104* 100 87  BUN 17   < > 26* 13 13  CREATININE 1.13*   < > 1.17* 0.87 0.83  CALCIUM 10.5*   < > 10.4* 10.1 10.4  MG 2.3  --   --   --   --   PHOS 3.1  --   --   --   --    < > = values in this interval not displayed.   Liver Function Tests: Recent Labs    01/29/23 1921 07/19/23 1115  AST 16 14  ALT 10 10  ALKPHOS 111  --   BILITOT 0.9 0.5  PROT 6.7 6.2  ALBUMIN 3.7  --    No results for input(s): "LIPASE", "AMYLASE" in the last 8760 hours. No results for input(s): "AMMONIA" in the last 8760 hours. CBC: Recent Labs    01/29/23 1921 01/29/23 1926 01/30/23 0459 01/31/23 0412 02/01/23 0310 07/19/23 1115  WBC 6.1  --  7.6 6.9 7.3 5.3  NEUTROABS 3.2  --  5.6  --   --  2,650  HGB 13.4   < > 13.9 14.0 13.0 13.0  HCT 41.1   < > 41.8 41.9 39.7 40.4  MCV 89.3  --  87.4 86.6 87.4 88.4  PLT 296  --  306 318 320 310   < > = values in this interval not displayed.   Lipid Panel: Recent Labs    01/31/23 0412 07/19/23 1115  CHOL  --  130  HDL  --  49*  LDLCALC  --  65  TRIG 108 76  CHOLHDL  --  2.7   TSH: No results for input(s): "TSH" in the last 8760 hours. A1C: Lab Results  Component Value Date   HGBA1C 5.6 05/29/2020     Assessment/Plan 1.  Paroxysmal atrial fibrillation (HCC) (Primary) - Stable, needs follow up with Cardiologist (has not followed up with Cardiology since 2022) - Labs ordered to be completed in clinic today - COMPLETE METABOLIC PANEL WITH GFR - CBC with Differential/Platelet - Continue carvedilol as prescribed - Continue Xarelto as ordered -cardiology consult.   2. Aortic atherosclerosis (HCC) - Noted on prior imaging  - Fasting lipid panel to be collected in clinic today - Continue atorvastatin as prescribed and continues on xarelto  3. Major depressive disorder, recurrent severe without psychotic features (HCC) - Follows Psychiatry - Continue Zoloft as prescribed - Mood stable, continues to be active, work, and spend quality time with family  4. Essential hypertension, benign - BP 130/82 in office today - CMP with GFR ordered to be collected today - Continue losartan, hydralazine, and carvedilol as prescribed  5. Hyperlipidemia LDL goal <70 - Labs ordered to be collected in clinic today -  Lipid panel (fasting) - COMPLETE METABOLIC PANEL WITH GFR - Continue atorvastatin as prescribed  6. History of CVA (cerebrovascular accident) - Stable except for occasional loss of balance - Continue Xarelto as prescribed  7. Allergic rhinitis, unspecified seasonality, unspecified trigger - Slight post nasal drip noted in exam - Advised patient to take OTC loratadine   8. Right hand pain - X ray of right hand ordered - DG Hand Complete Right; Future - Continue applying Voltaren gel to painful area 4 times daily as needed  9. Hoarseness - Denies any sick contacts - Denies throat pain, difficulty eating or drinking - Advised patient to continue salt water gargle  10. Varicose veins of both lower extremities, unspecified whether complicated - Chronic issue, advised patient to stop using OTC antifungal cream to bilateral upper thighs - Advised patient to elevate both legs when sitting for a prolonged  time.  - Patient advised not to excessively itch right lower leg  Follow up in 6 months for routine check up.  Lenord Fellers, RN DNP-AGPCNP Student I personally was present during the history, physical exam and medical decision-making activities of this service and have verified that the service and findings are accurately documented in the student's note Elman Dettman K. Biagio Borg [facility] [phone]

## 2024-01-24 NOTE — Patient Instructions (Signed)
To take Loratadine or cetrizine (generic for Claritin or zyrtec) 10 mg by mouth daily for allergies.   To get xray of your hand due to pain  Warm water, tylenol as needed for sore throat

## 2024-01-25 LAB — COMPLETE METABOLIC PANEL WITH GFR
AG Ratio: 1.6 (calc) (ref 1.0–2.5)
ALT: 7 U/L (ref 6–29)
AST: 11 U/L (ref 10–35)
Albumin: 3.9 g/dL (ref 3.6–5.1)
Alkaline phosphatase (APISO): 127 U/L (ref 37–153)
BUN: 11 mg/dL (ref 7–25)
CO2: 29 mmol/L (ref 20–32)
Calcium: 10.9 mg/dL — ABNORMAL HIGH (ref 8.6–10.4)
Chloride: 106 mmol/L (ref 98–110)
Creat: 0.8 mg/dL (ref 0.60–0.95)
Globulin: 2.4 g/dL (ref 1.9–3.7)
Glucose, Bld: 96 mg/dL (ref 65–99)
Potassium: 4.5 mmol/L (ref 3.5–5.3)
Sodium: 141 mmol/L (ref 135–146)
Total Bilirubin: 0.5 mg/dL (ref 0.2–1.2)
Total Protein: 6.3 g/dL (ref 6.1–8.1)
eGFR: 74 mL/min/{1.73_m2} (ref >=60)

## 2024-01-25 LAB — CBC WITH DIFFERENTIAL/PLATELET
Absolute Lymphocytes: 1275 {cells}/uL (ref 850–3900)
Absolute Monocytes: 617 {cells}/uL (ref 200–950)
Basophils Absolute: 128 {cells}/uL (ref 0–200)
Basophils Relative: 2.5 %
Eosinophils Absolute: 199 {cells}/uL (ref 15–500)
Eosinophils Relative: 3.9 %
HCT: 41.4 % (ref 35.0–45.0)
Hemoglobin: 13.2 g/dL (ref 11.7–15.5)
MCH: 28.6 pg (ref 27.0–33.0)
MCHC: 31.9 g/dL — ABNORMAL LOW (ref 32.0–36.0)
MCV: 89.8 fL (ref 80.0–100.0)
MPV: 11.8 fL (ref 7.5–12.5)
Monocytes Relative: 12.1 %
Neutro Abs: 2882 {cells}/uL (ref 1500–7800)
Neutrophils Relative %: 56.5 %
Platelets: 306 10*3/uL (ref 140–400)
RBC: 4.61 10*6/uL (ref 3.80–5.10)
RDW: 12.5 % (ref 11.0–15.0)
Total Lymphocyte: 25 %
WBC: 5.1 10*3/uL (ref 3.8–10.8)

## 2024-01-25 LAB — LIPID PANEL
Cholesterol: 149 mg/dL (ref <200)
HDL: 47 mg/dL — ABNORMAL LOW (ref >=50)
LDL Cholesterol (Calc): 81 mg/dL
Non-HDL Cholesterol (Calc): 102 mg/dL (ref <130)
Total CHOL/HDL Ratio: 3.2 (calc) (ref <5.0)
Triglycerides: 116 mg/dL (ref <150)

## 2024-02-03 ENCOUNTER — Telehealth: Payer: Self-pay

## 2024-02-03 NOTE — Telephone Encounter (Signed)
 For cough and congestion would suggest she start mucinex DM by mouth twice daily She can get this over the counter To take routinely for 7 days with full glass of water If she is having shortness of breath we need to see her in office

## 2024-02-03 NOTE — Telephone Encounter (Signed)
 Patient states she was seen in office on this week and she states she did not discuss the mucus she has started to cough up. Patient states she also is having some shortness of breath as well. Patient was offered another appointment , but she declined and states she wants to see if something can be sent to her pharmacy

## 2024-02-04 NOTE — Telephone Encounter (Signed)
Called and discussed with the patient.

## 2024-02-10 ENCOUNTER — Encounter (HOSPITAL_BASED_OUTPATIENT_CLINIC_OR_DEPARTMENT_OTHER): Payer: Self-pay

## 2024-02-23 ENCOUNTER — Telehealth: Payer: Self-pay | Admitting: *Deleted

## 2024-02-23 DIAGNOSIS — I700 Atherosclerosis of aorta: Secondary | ICD-10-CM

## 2024-02-23 DIAGNOSIS — I480 Paroxysmal atrial fibrillation: Secondary | ICD-10-CM

## 2024-02-23 DIAGNOSIS — I10 Essential (primary) hypertension: Secondary | ICD-10-CM

## 2024-02-23 NOTE — Telephone Encounter (Signed)
Patient aware referral placed.

## 2024-02-23 NOTE — Telephone Encounter (Signed)
 New referral placed.

## 2024-02-23 NOTE — Telephone Encounter (Signed)
 Copied from CRM [id number]. Topic: Referral - Request for Referral >> Feb 23, 2024  9:45 AM Philippa Chester F wrote: Did the patient discuss referral with their provider in the last year? Yes   Appointment offered? Yes  Reason for call: Patient is calling stating that she had a bad experience with her previous assigned cardiologist Cristal Deer, Hughie Closs, MD). The location was too far away for her to travel.  She stated she would like to be reffered to a cardiologist within the [facility] (preferrably a cardiologist closer to her home). Patient stated she needs to be seen sooner and their next appt is not until July. She is open to seeing a cardiologist recommended by the clinic at [facility] at [place].    Phone number to call:  [phone]    Type of order/referral and detailed reason for visit: Cardiology       Forwarded to [facility] for referral order.   Preference of office, provider, location:    [facility] at [facility] [address] [place],  [place]  [address]  If referral order, have you been seen by this specialty before? No, Pt previously saw a Dr. Sherilyn Cooter but he is not listed as a provider at [facility] at [facility]  Can we respond through MyChart? No, please call patient with a update on this request at [phone]

## 2024-03-24 ENCOUNTER — Ambulatory Visit (HOSPITAL_BASED_OUTPATIENT_CLINIC_OR_DEPARTMENT_OTHER): Payer: Self-pay | Admitting: Family

## 2024-04-17 ENCOUNTER — Telehealth: Payer: Self-pay | Admitting: *Deleted

## 2024-04-17 DIAGNOSIS — I480 Paroxysmal atrial fibrillation: Secondary | ICD-10-CM

## 2024-04-17 MED ORDER — APIXABAN 5 MG PO TABS: 5.0000 mg | ORAL_TABLET | Freq: Two times a day (BID) | ORAL | 2 refills | Status: DC

## 2024-04-17 NOTE — Telephone Encounter (Signed)
 Patient called back checking on status, stated that she has been out of medication for 2 days now.   Please Advise.

## 2024-04-17 NOTE — Telephone Encounter (Signed)
 To STOP xarelto  And start eliquis 5 mg by mouth twice daily To make sure she is taking every day to prevent blood clots due to having a fib.

## 2024-04-17 NOTE — Telephone Encounter (Signed)
 Copied from CRM [id number]. Topic: Clinical - Prescription Issue >> Erin Cunningham 9, 2025  4:23 PM Erin Cunningham wrote: Reason for CRM: Rivaroxaban  (XARELTO ) 15 MG TABS tablet is to expensive, Please call patient back   Patient would like something different due to cost.  Please Advise.

## 2024-04-17 NOTE — Telephone Encounter (Signed)
Patient Notified and agreed. 

## 2024-04-17 NOTE — Telephone Encounter (Signed)
Tried calling patient, LMOM to return call.

## 2024-04-19 NOTE — Telephone Encounter (Signed)
 Please call pt to see if she got the eliquis which was sent into her pharmacy vs the xarelto   She needs to take this medication twice daily vs the xarelto  which was once daily

## 2024-04-19 NOTE — Telephone Encounter (Signed)
 See Triage Notes from Triage Nurse FYI.   Message sent to Verma Gobble, NP

## 2024-04-19 NOTE — Telephone Encounter (Signed)
 Copied from CRM [id number]. Topic: Clinical - Medication Question >> Apr 19, 2024  1:05 PM Brynn Caras wrote: Reason for CRM: The patient is requesting a direct callback from her PCP. She's frustrated with her pharmacy due to her heart medication - Xarelto  being expensive. She states she is unsure which medication her PCP has called in. I attempted to provide the update - To stop Xarelto , and start Eliquis 5 MG by mouth twice daily. The patient disconnected the call before I was able to relay the update. Callback [phone]

## 2024-04-20 ENCOUNTER — Other Ambulatory Visit: Payer: Self-pay | Admitting: Nurse Practitioner

## 2024-04-20 MED ORDER — RIVAROXABAN 20 MG PO TABS: 20.0000 mg | ORAL_TABLET | Freq: Every day | ORAL | 1 refills | Status: DC

## 2024-04-20 NOTE — Telephone Encounter (Signed)
 Noted. Spoke with pharmacy and the have dc Eliquis

## 2024-04-20 NOTE — Progress Notes (Signed)
 Pt stated she needed an alternative to xarelto  however has changed her mind and willing to take medication. Xarelto  resent to pharmacy and added back to medication list.

## 2024-04-20 NOTE — Telephone Encounter (Signed)
 Patient went to the pharmacy and pick the Xarelto  and not the Eliquis because she made a mistake at the pharmacy and did stated the medication was there. She will stay on the Xarelto  and wanted to let Erin Gobble, NP know that she went to the Ortho appointment today stating she is still in pain.   Message sent to Erin Gobble, NP

## 2024-04-20 NOTE — Telephone Encounter (Signed)
 Please call the pharmacy and tell them to dc eliquis and I will correct on her medication list to avoid medication errors. Lets make sure she is up to date with her follow up appts.

## 2024-04-24 ENCOUNTER — Telehealth: Admitting: *Deleted

## 2024-04-24 NOTE — Telephone Encounter (Signed)
 Patient went to Urgent Care for foot pain and they referred her to Orthopaedic, Dr. Artemisa Lars. Never follow up with Erin Cunningham.  I told patient she would need to call the Urgent Care or Dr. [name] Office to get a work note.   She agreed and stated that she will call them.

## 2024-04-24 NOTE — Telephone Encounter (Signed)
 Copied from CRM [id number]. Topic: General - Other >> Avalyn Molino 19, 2025  8:54 AM Shelby Dessert H wrote: Reason for CRM: Patient is calling and she is needing a doctors excuse to return back to work tomorrow, patient will come and pick it up today. The dates the patient is needing for the excuse is Katelynn Heidler 8th to the 19th.

## 2024-05-25 ENCOUNTER — Other Ambulatory Visit: Payer: Self-pay | Admitting: Nurse Practitioner

## 2024-05-25 DIAGNOSIS — E785 Hyperlipidemia, unspecified: Secondary | ICD-10-CM

## 2024-05-25 NOTE — Telephone Encounter (Signed)
 Copied from CRM [id number]. Topic: Clinical - Medication Refill >> May 25, 2024 10:10 AM Talmadge Fail S wrote: Medication:  atorvastatin  (LIPITOR ) 80 MG tablet rivaroxaban  (XARELTO ) 20 MG TABS tablet   Has the patient contacted their pharmacy? Yes (Agent: If no, request that the patient contact the pharmacy for the refill. If patient does not wish to contact the pharmacy document the reason why and proceed with request.) (Agent: If yes, when and what did the pharmacy advise?) Advised to call provider's office  This is the patient's preferred pharmacy:  [id number] - [place], [place] - [address] AT [address] [place] [address] Phone: [phone] Fax: [phone]  Is this the correct pharmacy for this prescription? Yes If no, delete pharmacy and type the correct one.   Has the prescription been filled recently? No  Is the patient out of the medication? Yes  Has the patient been seen for an appointment in the last year OR does the patient have an upcoming appointment? Yes  Can we respond through MyChart? No  Agent: Please be advised that Rx refills may take up to 3 business days. We ask that you follow-up with your pharmacy.

## 2024-05-25 NOTE — Telephone Encounter (Signed)
 Patient has request refill on medication Atorvastatin . Last refilled on 03/19/2023 with 90 day supply and no refills. Patient also requested refill on Xarelto  that was just refilled 04/20/2024 with 90 day supply and 1 refill. Message routed to PCP Roselie Conger, Champ Coma, NP to approve or deny request.

## 2024-05-26 MED ORDER — RIVAROXABAN 20 MG PO TABS: 20.0000 mg | ORAL_TABLET | Freq: Every day | ORAL | 1 refills | Status: DC

## 2024-05-26 MED ORDER — ATORVASTATIN CALCIUM 80 MG PO TABS: 80.0000 mg | ORAL_TABLET | Freq: Every day | ORAL | 0 refills | Status: DC

## 2024-07-28 ENCOUNTER — Ambulatory Visit (INDEPENDENT_AMBULATORY_CARE_PROVIDER_SITE_OTHER): Payer: Medicare Other | Admitting: Nurse Practitioner

## 2024-07-28 ENCOUNTER — Encounter: Payer: Self-pay | Admitting: Nurse Practitioner

## 2024-07-28 VITALS — BP 150/80 | HR 60 | Temp 97.8°F | Ht 63.0 in | Wt 159.4 lb

## 2024-07-28 DIAGNOSIS — F332 Major depressive disorder, recurrent severe without psychotic features: Secondary | ICD-10-CM

## 2024-07-28 DIAGNOSIS — E785 Hyperlipidemia, unspecified: Secondary | ICD-10-CM | POA: Diagnosis not present

## 2024-07-28 DIAGNOSIS — I480 Paroxysmal atrial fibrillation: Secondary | ICD-10-CM

## 2024-07-28 DIAGNOSIS — I422 Other hypertrophic cardiomyopathy: Secondary | ICD-10-CM

## 2024-07-28 DIAGNOSIS — Z8673 Personal history of transient ischemic attack (TIA), and cerebral infarction without residual deficits: Secondary | ICD-10-CM | POA: Diagnosis not present

## 2024-07-28 DIAGNOSIS — M545 Low back pain, unspecified: Secondary | ICD-10-CM

## 2024-07-28 DIAGNOSIS — I10 Essential (primary) hypertension: Secondary | ICD-10-CM

## 2024-07-28 DIAGNOSIS — M7061 Trochanteric bursitis, right hip: Secondary | ICD-10-CM

## 2024-07-28 DIAGNOSIS — G8929 Other chronic pain: Secondary | ICD-10-CM

## 2024-07-28 MED ORDER — LOSARTAN POTASSIUM 100 MG PO TABS: 100.0000 mg | ORAL_TABLET | Freq: Every day | ORAL | 3 refills | Status: DC

## 2024-07-28 NOTE — Progress Notes (Signed)
 Careteam: Patient Care Team: Caro Harlene POUR, NP as PCP - General (Geriatric Medicine) Lonni Slain, MD as Consulting Physician (Cardiology)  PLACE OF SERVICE:  [facility]  Advanced Directive information    No Known Allergies  Chief Complaint  Patient presents with   Follow-up    6 month follow up with labs  Patient has concerns about leg brown spot with some itching.   Patient has concerns about feeling her heart flip about month. But she stated she hadn't taken her blood pressure medicine that day.    HPI:  Discussed the use of AI scribe software for clinical note transcription with the patient, who gave verbal consent to proceed.  History of Present Illness Erin Cunningham is an 83 year old female with hypertension and coronary artery disease who presents for a six-month follow-up.  Her antihypertensive medications are taken irregularly. She takes hydralazine  once daily in the evening instead of the prescribed three times a day due to dizziness when taken in the morning. She does report she takes coreg  regularly twice a day and takes losartan  regularly.. She has not taken Xarelto  or atorvastatin  for five days due to cost, but plans to pick them up today with financial help from her son.   She experiences low back pain, which is worsening over time. The pain is localized to the right side and is exacerbated by sitting and standing. She feels unsteady and wobbly at times.  She has not seen her cardiologist recently due to communication issues with the referral process. She was unaware of the attempts to contact her for an appointment and expresses frustration with the process.   She is on Zoloft  100 mg daily due to depression.  She has not been seeing her therapist recently due to scheduling conflicts and has not rescheduled an appointment. She relies on her faith to cope with her struggles and has not shared her problems with her church group or  family.   Review of Systems:  Review of Systems  Constitutional:  Negative for chills, fever and weight loss.  HENT:  Negative for tinnitus.   Respiratory:  Negative for cough, sputum production and shortness of breath.   Cardiovascular:  Negative for chest pain, palpitations and leg swelling.  Gastrointestinal:  Negative for abdominal pain, constipation, diarrhea and heartburn.  Genitourinary:  Negative for dysuria, frequency and urgency.  Musculoskeletal:  Positive for back pain. Negative for falls, joint pain and myalgias.  Skin: Negative.   Neurological:  Negative for dizziness and headaches.  Psychiatric/Behavioral:  Positive for depression. Negative for memory loss. The patient does not have insomnia.     Past Medical History:  Diagnosis Date   Atrial fibrillation (HCC)    Breast CA (HCC) 2006   right ([place])   Cancer (HCC)    Helicobacter pylori gastritis 2001   High blood pressure    History of adenomatous polyp of colon 09/23/2017   Oma 09/25/2017 and apparently polyps in the past as well though recalled age   History of shingles    Major depressive disorder 05/28/2020   Major depressive disorder, recurrent, severe without psychotic features (HCC) 06/24/2015   Parotid mass 2014   right benign cyst   Personal history of radiation therapy 2006   Rt breast   Stroke (cerebrum) (HCC) 06/28/2020   Per patient   Wears glasses    Past Surgical History:  Procedure Laterality Date   ABDOMINAL HYSTERECTOMY  1982   BSO   BREAST LUMPECTOMY  Right 2007   BREAST LUMPECTOMY WITH SENTINEL LYMPH NODE BIOPSY  2005   right   BREAST SURGERY  2004   rt br mass   CESAREAN SECTION     COLONOSCOPY     ESOPHAGOGASTRODUODENOSCOPY  2001   H pylori gastritis   EYE SURGERY     both cataracts   PAROTIDECTOMY Right 09/05/2013   Procedure: RIGHT SUPERFICIAL PAROTIDECTOMY WITH FACIAL NERVE DISECTION;  Surgeon: Lonni FORBES Angle, MD;  Location: [facility];  Service: ENT;   Laterality: Right;  All documentation done by R.Ward after [phone] --done under G. Garzon's password   RE-EXCISION OF BREAST LUMPECTOMY  2005   right   Social History:   reports that she has never smoked. Her smokeless tobacco use includes chew. She reports current alcohol use. She reports that she does not use drugs.  Family History  Problem Relation Age of Onset   Heart disease Mother        MI   Cancer Father        bone   Thyroid  disease Neg Hx    Hypercalcemia Neg Hx    Colon cancer Neg Hx    Stomach cancer Neg Hx    Rectal cancer Neg Hx    Esophageal cancer Neg Hx     Medications: Patient's Medications  New Prescriptions   No medications on file  Previous Medications   ATORVASTATIN  (LIPITOR ) 80 MG TABLET    Take 1 tablet (80 mg total) by mouth daily.   CARVEDILOL  (COREG ) 3.125 MG TABLET    Take 1 tablet (3.125 mg total) by mouth 2 (two) times daily with a meal.   DICLOFENAC  SODIUM (VOLTAREN ) 1 % GEL    Apply 2 g topically 4 (four) times daily as needed.   RIVAROXABAN  (XARELTO ) 20 MG TABS TABLET    Take 1 tablet (20 mg total) by mouth daily with supper.   SERTRALINE  (ZOLOFT ) 100 MG TABLET    Take 1 tablet (100 mg total) by mouth daily.   VITAMIN D  PO    Take 1 tablet by mouth daily.  Modified Medications   Modified Medication Previous Medication   LOSARTAN  (COZAAR ) 100 MG TABLET losartan  (COZAAR ) 50 MG tablet      Take 1 tablet (100 mg total) by mouth daily.    Take 1 tablet (50 mg total) by mouth daily.  Discontinued Medications   HYDRALAZINE  (APRESOLINE ) 100 MG TABLET    Take 1 tablet (100 mg total) by mouth every 8 (eight) hours.    Physical Exam:  Vitals:   07/28/24 0918 07/28/24 0924  BP: (!) 160/82 (!) 150/80  Pulse: 60 60  Temp: 97.8 F (36.6 C) 97.8 F (36.6 C)  SpO2: 96% 96%  Weight: 159 lb 6.4 oz (72.3 kg) 159 lb 6.4 oz (72.3 kg)  Height:  5' 3 (1.6 m)   Body mass index is 28.24 kg/m. Wt Readings from Last 3 Encounters:  07/28/24 159 lb 6.4 oz  (72.3 kg)  01/24/24 160 lb 12.8 oz (72.9 kg)  10/06/23 164 lb 12.8 oz (74.8 kg)    Physical Exam Constitutional:      General: She is not in acute distress.    Appearance: She is well-developed. She is not diaphoretic.  HENT:     Head: Normocephalic and atraumatic.     Mouth/Throat:     Pharynx: No oropharyngeal exudate.  Eyes:     Conjunctiva/sclera: Conjunctivae normal.     Pupils: Pupils are equal, round, and  reactive to light.  Cardiovascular:     Rate and Rhythm: Normal rate and regular rhythm.     Heart sounds: Normal heart sounds.  Pulmonary:     Effort: Pulmonary effort is normal.     Breath sounds: Normal breath sounds.  Abdominal:     General: Bowel sounds are normal.     Palpations: Abdomen is soft.  Musculoskeletal:     Cervical back: Normal range of motion and neck supple.     Lumbar back: Tenderness present.     Right upper leg: Tenderness present.     Right lower leg: No edema.     Left lower leg: No edema.       Legs:  Skin:    General: Skin is warm and dry.  Neurological:     Mental Status: She is alert.  Psychiatric:        Mood and Affect: Mood normal.     Labs reviewed: Basic Metabolic Panel: Recent Labs    10/06/23 0940 01/24/24 0942 07/28/24 0952  NA 141 141 141  K 3.8 4.5 4.5  CL 109 106 109  CO2 27 29 25   GLUCOSE 87 96 88  BUN 13 11 18   CREATININE 0.83 0.80 0.91  CALCIUM  10.4 10.9* 10.5*  TSH  --   --  1.64   Liver Function Tests: Recent Labs    01/24/24 0942 07/28/24 0952  AST 11 13  ALT 7 6  BILITOT 0.5 0.5  PROT 6.3 6.1   No results for input(s): LIPASE, AMYLASE in the last 8760 hours. No results for input(s): AMMONIA in the last 8760 hours. CBC: Recent Labs    01/24/24 0942 07/28/24 0952  WBC 5.1 5.3  NEUTROABS 2,882 2,502  HGB 13.2 12.6  HCT 41.4 38.9  MCV 89.8 89.4  PLT 306 325   Lipid Panel: Recent Labs    01/24/24 0942  CHOL 149  HDL 47*  LDLCALC 81  TRIG 883  CHOLHDL 3.2   TSH: Recent  Labs    07/28/24 0952  TSH 1.64   A1C: Lab Results  Component Value Date   HGBA1C 5.6 05/29/2020     Assessment/Plan  Hyperlipidemia LDL goal <70 Assessment & Plan: To continue on lipitor  with dietary modifications   Orders: -     AMB Referral [facility] Care Management  Paroxysmal atrial fibrillation [facility]) Assessment & Plan: Rate controlled on coreg . Continues on xarelto  for anticoagulation.   Orders: -     AMB Referral [facility] Care Management -     CBC with Differential/Platelet -     Comprehensive metabolic panel with GFR -     TSH -     Ambulatory referral to Cardiology  Essential hypertension, benign -     AMB Referral [facility] Care Management -     Losartan  Potassium; Take 1 tablet (100 mg total) by mouth daily.  Dispense: 90 tablet; Refill: 3 -     Ambulatory referral to Cardiology  History of CVA (cerebrovascular accident) Assessment & Plan: Continues on xarelto , discussed importance of maintaining proper bp and lipid control to decrease risk of recurrent CVA.  Encouraged compliance of medication   Orders: -     AMB Referral [facility] Care Management  Major depressive disorder, recurrent severe without psychotic features (HCC) Assessment & Plan: Ongoing, encouraged to follow up with therapist, continues on zoloft  100 mg daily   Hypertrophic cardiomyopathy (HCC) Assessment & Plan: Stable without worsening shortness of breath, chest pains or edema Encouraged cardiology  follow up, another referral placed and encouraged to follow up if she does not hear from office.   Orders: -     Ambulatory referral to Cardiology  Chronic right-sided low back pain without sciatica Assessment & Plan: Ongoing and progressive, no injury noted.  can use heat to low back, muscle rub, tylenol  PRN but will also get therapy involved.   Orders: -     Ambulatory referral to Physical Therapy  Trochanteric bursitis of right hip Assessment & Plan: Will get PT referral To ice TID.  Tylenol   PRN Unable to use NSAIDs due to xarelto  use.   Orders: -     Ambulatory referral to Physical Therapy  Essential hypertension Assessment & Plan: Not controlled today Has not taken medications due to being out of prescription but plans to get this filled today To check blood pressure 1 hour AFTER you have had your medication Make sure you have been sitting at least 5 mins  Goal <140/90 Will follow up on bp in 4 weeks.       Return in about 4 weeks (around 08/25/2024) for blood pressure.  Axil Copeman K. Caro BODILY [facility] [phone]

## 2024-07-28 NOTE — Patient Instructions (Signed)
 Increase losartan  to 100 mg daily Stop hydralazine 

## 2024-07-29 ENCOUNTER — Ambulatory Visit: Payer: Self-pay | Admitting: Nurse Practitioner

## 2024-07-29 LAB — CBC WITH DIFFERENTIAL/PLATELET
Absolute Lymphocytes: 1866 {cells}/uL (ref 850–3900)
Absolute Monocytes: 530 {cells}/uL (ref 200–950)
Basophils Absolute: 111 {cells}/uL (ref 0–200)
Basophils Relative: 2.1 %
Eosinophils Absolute: 292 {cells}/uL (ref 15–500)
Eosinophils Relative: 5.5 %
HCT: 38.9 % (ref 35.0–45.0)
Hemoglobin: 12.6 g/dL (ref 11.7–15.5)
MCH: 29 pg (ref 27.0–33.0)
MCHC: 32.4 g/dL (ref 32.0–36.0)
MCV: 89.4 fL (ref 80.0–100.0)
MPV: 12.3 fL (ref 7.5–12.5)
Monocytes Relative: 10 %
Neutro Abs: 2502 {cells}/uL (ref 1500–7800)
Neutrophils Relative %: 47.2 %
Platelets: 325 Thousand/uL (ref 140–400)
RBC: 4.35 Million/uL (ref 3.80–5.10)
RDW: 12.9 % (ref 11.0–15.0)
Total Lymphocyte: 35.2 %
WBC: 5.3 Thousand/uL (ref 3.8–10.8)

## 2024-07-29 LAB — COMPREHENSIVE METABOLIC PANEL WITH GFR
AG Ratio: 1.7 (calc) (ref 1.0–2.5)
ALT: 6 U/L (ref 6–29)
AST: 13 U/L (ref 10–35)
Albumin: 3.8 g/dL (ref 3.6–5.1)
Alkaline phosphatase (APISO): 109 U/L (ref 37–153)
BUN: 18 mg/dL (ref 7–25)
CO2: 25 mmol/L (ref 20–32)
Calcium: 10.5 mg/dL — ABNORMAL HIGH (ref 8.6–10.4)
Chloride: 109 mmol/L (ref 98–110)
Creat: 0.91 mg/dL (ref 0.60–0.95)
Globulin: 2.3 g/dL (ref 1.9–3.7)
Glucose, Bld: 88 mg/dL (ref 65–99)
Potassium: 4.5 mmol/L (ref 3.5–5.3)
Sodium: 141 mmol/L (ref 135–146)
Total Bilirubin: 0.5 mg/dL (ref 0.2–1.2)
Total Protein: 6.1 g/dL (ref 6.1–8.1)
eGFR: 63 mL/min/1.73m2 (ref >=60)

## 2024-07-29 LAB — TSH: TSH: 1.64 m[IU]/L (ref 0.40–4.50)

## 2024-07-31 DIAGNOSIS — M545 Low back pain, unspecified: Secondary | ICD-10-CM | POA: Insufficient documentation

## 2024-07-31 DIAGNOSIS — M7061 Trochanteric bursitis, right hip: Secondary | ICD-10-CM | POA: Insufficient documentation

## 2024-07-31 NOTE — Assessment & Plan Note (Addendum)
 Will get PT referral To ice TID.  Tylenol  PRN Unable to use NSAIDs due to xarelto  use.

## 2024-07-31 NOTE — Assessment & Plan Note (Signed)
 Ongoing and progressive, no injury noted.  can use heat to low back, muscle rub, tylenol  PRN but will also get therapy involved.

## 2024-07-31 NOTE — Assessment & Plan Note (Signed)
 Ongoing, encouraged to follow up with therapist, continues on zoloft  100 mg daily

## 2024-07-31 NOTE — Assessment & Plan Note (Signed)
 To continue on lipitor  with dietary modifications

## 2024-07-31 NOTE — Assessment & Plan Note (Signed)
 Continues on xarelto , discussed importance of maintaining proper bp and lipid control to decrease risk of recurrent CVA.  Encouraged compliance of medication

## 2024-07-31 NOTE — Assessment & Plan Note (Signed)
 Not controlled today Has not taken medications due to being out of prescription but plans to get this filled today To check blood pressure 1 hour AFTER you have had your medication Make sure you have been sitting at least 5 mins  Goal <140/90 Will follow up on bp in 4 weeks.

## 2024-07-31 NOTE — Assessment & Plan Note (Signed)
 Stable without worsening shortness of breath, chest pains or edema Encouraged cardiology follow up, another referral placed and encouraged to follow up if she does not hear from office.

## 2024-07-31 NOTE — Assessment & Plan Note (Signed)
 Rate controlled on coreg . Continues on xarelto  for anticoagulation.

## 2024-08-01 ENCOUNTER — Telehealth: Payer: Self-pay | Admitting: Pharmacist

## 2024-08-01 DIAGNOSIS — I10 Essential (primary) hypertension: Secondary | ICD-10-CM

## 2024-08-01 NOTE — Progress Notes (Signed)
   08/01/2024  Patient ID: Erin Cunningham, female   DOB: 1941-02-11, 83 y.o.   MRN: [id number]  Patient was called regarding medication adherence to her drug regimen per referral.  Unfortunately, she did not answer the phone. HIPAA compliant message was left on her voicemail.  Plan: Call Patient back in 7-10 business days.   Cassius DOROTHA Brought, PharmD, BCACP Clinical Pharmacist [phone]

## 2024-08-11 ENCOUNTER — Telehealth: Payer: Self-pay | Admitting: Pharmacist

## 2024-08-11 DIAGNOSIS — E785 Hyperlipidemia, unspecified: Secondary | ICD-10-CM

## 2024-08-11 NOTE — Progress Notes (Signed)
   08/11/2024  Patient ID: Erin Cunningham, female   DOB: [birth year], 83 y.o.   MRN: [id number]  Patient was called per referral for medication adherence. Unfortunately, she did not answer her phone. HIPAA compliant message was left on her voicemail.  Plan: Call patient back in 2 weekw.   Cassius DOROTHA Brought, PharmD, BCACP Clinical Pharmacist [phone]'

## 2024-08-16 ENCOUNTER — Ambulatory Visit: Payer: Self-pay

## 2024-08-16 NOTE — Telephone Encounter (Signed)
 Patient is aware of providers response and declined recommendations. Patient stated  No, no, I'm not going to do that, I'm headed home and I'll retake my blood pressure when I get home.

## 2024-08-16 NOTE — Telephone Encounter (Signed)
 Please call patient back to follow up, recommend going to urgent care/ED

## 2024-08-16 NOTE — Telephone Encounter (Signed)
 FYI Only or Action Required?: Action required by provider: medication refill request.  Patient was last seen in primary care on 07/28/2024 by Caro Harlene POUR, NP.  Called Nurse Triage reporting Hypertension.  Symptoms began today.  Interventions attempted: Nothing.  Symptoms are: unsure, patient ended call.  Triage Disposition: Call PCP Within 24 Hours  Patient/caregiver understands and will follow disposition?: Yes        Copied from CRM [id number]. Topic: Clinical - Red Word Triage >> Aug 16, 2024  1:08 PM Mercer PEDLAR wrote: Red Word that prompted transfer to Nurse Triage: Patient is having lightheadedness, does not feel good and BP is high.       Reason for Disposition  Ran out of BP medications  Answer Assessment - Initial Assessment Questions Patient calling in for a refill of her blood pressure medication. Patient states I'm not feeling well today. When trying to triage the patient states I don't have time for this now, I'm going to hand up. And ended the call.      1. BLOOD PRESSURE: What is your blood pressure? Did you take at least two measurements 5 minutes apart?     Unsure 2. ONSET: When did you take your blood pressure?     Today 3. HOW: How did you take your blood pressure? (e.g., automatic home BP monitor, visiting nurse)     Unsure 4. HISTORY: Do you have a history of high blood pressure?     Yes 5. MEDICINES: Are you taking any medicines for blood pressure? Have you missed any doses recently?     Took last dose 2 nights ago  6. OTHER SYMPTOMS: Do you have any symptoms? (e.g., blurred vision, chest pain, difficulty breathing, headache, weakness)     I'm not feeling well  Protocols used: Blood Pressure - High-A-AH

## 2024-08-19 ENCOUNTER — Other Ambulatory Visit: Payer: Self-pay | Admitting: Nurse Practitioner

## 2024-08-19 DIAGNOSIS — I10 Essential (primary) hypertension: Secondary | ICD-10-CM

## 2024-08-21 ENCOUNTER — Encounter: Payer: Self-pay | Admitting: Family

## 2024-08-21 ENCOUNTER — Ambulatory Visit (INDEPENDENT_AMBULATORY_CARE_PROVIDER_SITE_OTHER): Admitting: Family

## 2024-08-21 ENCOUNTER — Ambulatory Visit: Payer: Self-pay

## 2024-08-21 VITALS — BP 142/112 | HR 71 | Temp 97.7°F | Resp 19 | Ht 63.0 in | Wt 157.2 lb

## 2024-08-21 DIAGNOSIS — I10 Essential (primary) hypertension: Secondary | ICD-10-CM

## 2024-08-21 DIAGNOSIS — F32A Depression, unspecified: Secondary | ICD-10-CM

## 2024-08-21 DIAGNOSIS — E785 Hyperlipidemia, unspecified: Secondary | ICD-10-CM | POA: Diagnosis not present

## 2024-08-21 DIAGNOSIS — F419 Anxiety disorder, unspecified: Secondary | ICD-10-CM | POA: Diagnosis not present

## 2024-08-21 DIAGNOSIS — F17229 Nicotine dependence, chewing tobacco, with unspecified nicotine-induced disorders: Secondary | ICD-10-CM

## 2024-08-21 MED ORDER — LOSARTAN POTASSIUM 100 MG PO TABS: 100.0000 mg | ORAL_TABLET | Freq: Every day | ORAL | 3 refills | Status: DC

## 2024-08-21 MED ORDER — ATORVASTATIN CALCIUM 80 MG PO TABS
80.0000 mg | ORAL_TABLET | Freq: Every day | ORAL | 1 refills | Status: AC
Start: 2024-08-21 — End: ?

## 2024-08-21 MED ORDER — CLONIDINE HCL 0.1 MG PO TABS
0.1000 mg | ORAL_TABLET | Freq: Once | ORAL | Status: AC
  Administered 2024-08-21: 0.1 mg via ORAL

## 2024-08-21 NOTE — Telephone Encounter (Signed)
 Noted thank you, see she has appt with dinah

## 2024-08-21 NOTE — Progress Notes (Signed)
 Provider: Alesi Zachery FNP-C   Caro Harlene POUR, NP  Patient Care Team: Caro Harlene POUR, NP as PCP - General (Geriatric Medicine) Lonni Slain, MD as Consulting Physician (Cardiology)  Extended Emergency Contact Information Primary Emergency Contact: Mannie Kiki GUTTING [facility] Phone: [phone] Mobile Phone: [phone] Relation: Son Secondary Emergency Contact: Fruth,Christopher Mobile Phone: [phone] Relation: Son Preferred language: English  Code Status:  Full Code  Goals of care: Advanced Directive information    08/21/2024    1:02 PM  Advanced Directives  Does Patient Have a Medical Advance Directive? Yes  Type of Advance Directive Healthcare Power of Attorney  Does patient want to make changes to medical advance directive? No - Patient declined  Copy of Healthcare Power of Attorney in Chart? No - copy requested  Would patient like information on creating a medical advance directive? No - Patient declined     Chief Complaint  Patient presents with   Hypertension    Elevated blood pressure.     Discussed the use of AI scribe software for clinical note transcription with the patient, who gave verbal consent to proceed.  History of Present Illness   Erin Cunningham is an 83 year old female with hypertension who presents with elevated blood pressure readings.  She has been experiencing significantly elevated blood pressure readings at home, with a reading of 252/140 mmHg this morning and similar high readings over the past few days. She left her log of blood pressure readings at home.  She is currently taking losartan , which was recently increased to 100 mg, but she has not been able to obtain the new dosage from the pharmacy. She takes losartan  once in the morning and once in the afternoon. Though seems to be confused whether taking once or twice daily. She also takes carvedilol  3.125 mg twice daily with meals and is compliant with this  regimen. She mentions taking a blood thinner daily with supper due to fear of complications. She is also prescribed atorvastatin  80 mg daily for cholesterol, but there is some confusion about whether she is currently taking it. She admits to inconsistent use of sertraline  100 mg, taking it only when she feels like it. She has stopped using Voltaren  gel.  She expresses concern about the cost of medications, preferring a 30-day supply due to affordability issues.  She has a history of chewing tobacco, which she has recently quit, and consumes caffeine  through coffee and soda, which she is trying to reduce. She is aware of the need to drink more water and reduce salt intake. She has not been engaging in regular exercise recently.   Past Medical History:  Diagnosis Date   Atrial fibrillation (HCC)    Breast CA (HCC) 2006   right ([place])   Cancer (HCC)    Helicobacter pylori gastritis 2001   High blood pressure    History of adenomatous polyp of colon 09/23/2017   Oma 09/25/2017 and apparently polyps in the past as well though recalled age   History of shingles    Major depressive disorder 05/28/2020   Major depressive disorder, recurrent, severe without psychotic features (HCC) 06/24/2015   Parotid mass 2014   right benign cyst   Personal history of radiation therapy 2006   Rt breast   Stroke (cerebrum) (HCC) 06/28/2020   Per patient   Wears glasses    Past Surgical History:  Procedure Laterality Date   ABDOMINAL HYSTERECTOMY  1982   BSO   BREAST LUMPECTOMY Right 2007  BREAST LUMPECTOMY WITH SENTINEL LYMPH NODE BIOPSY  2005   right   BREAST SURGERY  2004   rt br mass   CESAREAN SECTION     COLONOSCOPY     ESOPHAGOGASTRODUODENOSCOPY  2001   H pylori gastritis   EYE SURGERY     both cataracts   PAROTIDECTOMY Right 09/05/2013   Procedure: RIGHT SUPERFICIAL PAROTIDECTOMY WITH FACIAL NERVE DISECTION;  Surgeon: Lonni FORBES Angle, MD;  Location:  S[facility];   Service: ENT;  Laterality: Right;  All documentation done by R.Ward after [phone] --done under G. Garzon's password   RE-EXCISION OF BREAST LUMPECTOMY  2005   right    No Known Allergies  Allergies as of 08/21/2024   No Known Allergies      Medication List        Accurate as of August 21, 2024  1:46 PM. If you have any questions, ask your nurse or doctor.          atorvastatin  80 MG tablet Commonly known as: LIPITOR  Take 1 tablet (80 mg total) by mouth daily.   carvedilol  3.125 MG tablet Commonly known as: COREG  Take 1 tablet (3.125 mg total) by mouth 2 (two) times daily with a meal.   diclofenac  Sodium 1 % Gel Commonly known as: VOLTAREN  Apply 2 g topically 4 (four) times daily as needed.   losartan  100 MG tablet Commonly known as: COZAAR  Take 1 tablet (100 mg total) by mouth daily.   rivaroxaban  20 MG Tabs tablet Commonly known as: XARELTO  Take 1 tablet (20 mg total) by mouth daily with supper.   sertraline  100 MG tablet Commonly known as: ZOLOFT  Take 1 tablet (100 mg total) by mouth daily.   VITAMIN D  PO Take 1 tablet by mouth daily.        Review of Systems  Constitutional:  Negative for appetite change, chills, fatigue, fever and unexpected weight change.  HENT:  Negative for congestion, ear discharge, ear pain, facial swelling, hearing loss, nosebleeds, postnasal drip, rhinorrhea, sinus pressure, sinus pain, sneezing, sore throat and tinnitus.   Eyes:  Negative for pain, discharge, redness, itching and visual disturbance.  Respiratory:  Negative for cough, chest tightness, shortness of breath and wheezing.   Cardiovascular:  Negative for chest pain, palpitations and leg swelling.  Gastrointestinal:  Negative for abdominal distention, abdominal pain, nausea and vomiting.  Genitourinary:  Negative for difficulty urinating, dysuria, flank pain, frequency and urgency.  Musculoskeletal:  Negative for arthralgias, back pain, gait problem, joint swelling and  myalgias.  Skin:  Negative for color change, pallor and rash.  Neurological:  Negative for dizziness, syncope, speech difficulty, weakness, light-headedness, numbness and headaches.  Psychiatric/Behavioral:  Negative for agitation, behavioral problems, confusion, hallucinations, self-injury, sleep disturbance and suicidal ideas. The patient is not nervous/anxious.     Immunization History  Administered Date(s) Administered   Fluad Quad(high Dose 65+) 01/15/2020, 12/20/2020, 09/28/2022   Fluad Trivalent(High Dose 65+) 10/06/2023   INFLUENZA, HIGH DOSE SEASONAL PF 09/14/2018   Influenza,inj,Quad PF,6+ Mos 11/06/2015   PFIZER(Purple Top)SARS-COV-2 Vaccination 02/03/2020, 02/24/2020   PPD Test 12/10/2015   Pneumococcal Conjugate-13 01/16/2015   Pneumococcal Polysaccharide-23 05/12/2017   Pneumococcal-Unspecified 03/20/2023   Tdap 06/15/2018, 01/12/2022, 03/21/2023   Zoster Recombinant(Shingrix) 03/13/2022, 03/20/2023   Zoster, Unspecified 12/05/2014   Pertinent  Health Maintenance Due  Topic Date Due   Influenza Vaccine  08/11/2025 (Originally 07/07/2024)   DEXA SCAN  Completed      06/08/2023    2:10 PM 07/19/2023  10:50 AM 10/06/2023    9:18 AM 01/24/2024    8:37 AM 07/28/2024    9:18 AM  Fall Risk  Falls in the past year? 0 0 0 0 0  Was there an injury with Fall? 0 0 0 0 0  Fall Risk Category Calculator 0 0 0 0 0  Patient at Risk for Falls Due to No Fall Risks No Fall Risks No Fall Risks No Fall Risks No Fall Risks  Fall risk Follow up Falls evaluation completed Falls evaluation completed  Falls evaluation completed Falls evaluation completed   Functional Status Survey:    Vitals:   08/21/24 1311 08/21/24 1321  BP: (!) 180/110 (!) 142/112  Pulse: 71   Resp: 19   Temp: 97.7 F (36.5 C)   SpO2: 97%   Weight: 157 lb 3.2 oz (71.3 kg)   Height: 5' 3 (1.6 m)    Body mass index is 27.85 kg/m. Physical Exam  VITALS: BP- 180/110 rechecked 150/100with clonidine   GENERAL:  Alert, cooperative, well developed, no acute distress. HEENT: Normocephalic, normal oropharynx, moist mucous membranes. CHEST: Clear to auscultation bilaterally, no wheezes, rhonchi, or crackles. CARDIOVASCULAR: Normal heart rate and rhythm, S1 and S2 normal without murmurs. ABDOMEN: Soft, non-tender, non-distended, without organomegaly, normal bowel sounds. EXTREMITIES: No cyanosis or edema, good pulses. NEUROLOGICAL: Cranial nerves grossly intact, moves all extremities without gross motor or sensory deficit.   PSYCHIATRY/BEHAVIORAL: Mood stable   Labs reviewed: Recent Labs    10/06/23 0940 01/24/24 0942 07/28/24 0952  NA 141 141 141  K 3.8 4.5 4.5  CL 109 106 109  CO2 27 29 25   GLUCOSE 87 96 88  BUN 13 11 18   CREATININE 0.83 0.80 0.91  CALCIUM  10.4 10.9* 10.5*   Recent Labs    01/24/24 0942 07/28/24 0952  AST 11 13  ALT 7 6  BILITOT 0.5 0.5  PROT 6.3 6.1   Recent Labs    01/24/24 0942 07/28/24 0952  WBC 5.1 5.3  NEUTROABS 2,882 2,502  HGB 13.2 12.6  HCT 41.4 38.9  MCV 89.8 89.4  PLT 306 325   Lab Results  Component Value Date   TSH 1.64 07/28/2024   Lab Results  Component Value Date   HGBA1C 5.6 05/29/2020   Lab Results  Component Value Date   CHOL 149 01/24/2024   HDL 47 (L) 01/24/2024   LDLCALC 81 01/24/2024   TRIG 116 01/24/2024   CHOLHDL 3.2 01/24/2024    Significant Diagnostic Results in last 30 days:  No results found.  Assessment/Plan  Hypertensive urgency Severe hypertension with home readings up to 252/140 mmHg. Current regimen includes losartan  and carvedilol , but losartan  was not filled due to pharmacy issues. High blood pressure poses a risk for stroke and other cardiovascular events. - Send prescription for losartan  100 mg once daily to [facility] with 30 days supply and three refills. - Instruct to take carvedilol  3.125 mg twice daily with meals. - Administer clonidine  0.1 mg to acutely lower blood pressure. - Advise to monitor  blood pressure twice daily and maintain a log. - Encourage use of a pill box for medication organization. - Advise to reduce salt intake and increase water consumption. - Encourage regular exercise to aid in blood pressure management.  Hyperlipidemia Currently prescribed atorvastatin  80 mg daily for cholesterol management. Financial constraints affect medication adherence. - Send prescription for atorvastatin  80 mg daily to [facility] with 30 days supply and three refills.  Nicotine dependence, chewing tobacco Recent cessation of chewing  tobacco. Discussed the negative impact of nicotine on cardiovascular health and the benefits of quitting. - Encourage continued abstinence from tobacco products. - Advise to avoid caffeine  and soda to prevent exacerbation of hypertension.  Depression Intermittent use of sertraline  100 mg daily. - Instruct to take sertraline  100 mg daily consistently.    Family/ staff Communication: Reviewed plan of care with patient verbalized understanding   Labs/tests ordered: None   Next Appointment : Return if symptoms worsen or fail to improve, for has appointment with PCP 08/28/2024.   Spent 20 minutes of Face to face and non-face to face with patient  >50% time spent counseling; reviewing medical record; tests; labs; documentation and developing future plan of care.   Roxan JAYSON Plough, NP

## 2024-08-21 NOTE — Telephone Encounter (Signed)
 FYI Only or Action Required?: Pt is coming into the office now to be seen - see note.  Patient was last seen in primary care on 07/28/2024 by Caro Harlene POUR, NP.  Called Nurse Triage reporting Hypertension.  Symptoms began unknown.  Interventions attempted: Other: unknown.  Symptoms are: unknown.  Triage Disposition: See HCP Within 4 Hours (Or PCP Triage)  Patient/caregiver understands and will follow disposition?: Unsure  Triage Disposition: See HCP Within 4 Hours (Or PCP Triage)  Patient/caregiver understands and will follow disposition?: Unsure                  Copied from CRM #[id number]. Topic: Clinical - Red Word Triage >> Aug 21, 2024  8:15 AM Cherylann RAMAN wrote: Red Word that prompted transfer to Nurse Triage: Patient bp is 252/140 as of this morning. Patient states that she feels fine but she can tell that something isn't right. Every now and again the left side of her head has an area that throbs but it doesn't hurt but she knows it's there. Reason for Disposition  [1] Systolic BP >= 200 OR Diastolic >= 120 AND [2] having NO cardiac or neurologic symptoms  Answer Assessment - Initial Assessment Questions Pt called in this morning and was placed on hold for transfer to NT. Pt was on hold for approximately 3 minutes when agent returned to pt to check on her. At that time pt states she would not continue to hold and asked for her to be called back. Called answered by NT, agent requested that NT call pt back. Called pt back. Pt states that she does not want to talk to anyone. Her bp is 252/140 and she is coming into the office now. Pt terminated call. NT called [facility] to inform of pt's intentions - spoke to [place].       1. BLOOD PRESSURE: What is your blood pressure? Did you take at least two measurements 5 minutes apart?     usure 2. ONSET: When did you take your blood pressure?     unsure 3. HOW: How did you take your blood pressure? (e.g., automatic  home BP monitor, visiting nurse)     unsure 4. HISTORY: Do you have a history of high blood pressure?     unsure 5. MEDICINES: Are you taking any medicines for blood pressure? Have you missed any doses recently?     Unsure 6. OTHER SYMPTOMS: Do you have any symptoms? (e.g., blurred vision, chest pain, difficulty breathing, headache, weakness)     Did not want to speak with nurse  Protocols used: Blood Pressure - High-A-AH

## 2024-08-28 ENCOUNTER — Encounter: Payer: Self-pay | Admitting: Nurse Practitioner

## 2024-08-28 ENCOUNTER — Encounter: Admitting: Nurse Practitioner

## 2024-08-28 NOTE — Progress Notes (Signed)
 This encounter was created in error - please disregard.

## 2024-08-29 ENCOUNTER — Telehealth: Payer: Self-pay | Admitting: Pharmacist

## 2024-08-29 DIAGNOSIS — E785 Hyperlipidemia, unspecified: Secondary | ICD-10-CM

## 2024-08-29 NOTE — Progress Notes (Signed)
   08/29/2024  Patient ID: Erin Cunningham, female   DOB: 02/22/1941, 83 y.o.   MRN: [id number]  Patient was called regarding medication adherence. Unfortunately, she did not answer the phone. HIPAA compliant message was not left on her voicemail because the voicemail box was full.  Plan: Will close the Patient's Pharmacy case. Today's call was the third unsuccessful phone call.  Will gladly reopen case upon request.  Cassius DOROTHA Brought, PharmD, BCACP Clinical Pharmacist [phone]

## 2024-09-07 ENCOUNTER — Telehealth: Payer: Self-pay | Admitting: Pharmacist

## 2024-09-07 DIAGNOSIS — E785 Hyperlipidemia, unspecified: Secondary | ICD-10-CM

## 2024-09-07 DIAGNOSIS — I700 Atherosclerosis of aorta: Secondary | ICD-10-CM

## 2024-09-07 DIAGNOSIS — Z8673 Personal history of transient ischemic attack (TIA), and cerebral infarction without residual deficits: Secondary | ICD-10-CM

## 2024-09-07 MED ORDER — RIVAROXABAN 20 MG PO TABS: 20.0000 mg | ORAL_TABLET | Freq: Every day | ORAL | 1 refills | Status: AC

## 2024-09-07 MED ORDER — CARVEDILOL 3.125 MG PO TABS: 3.1250 mg | ORAL_TABLET | Freq: Two times a day (BID) | ORAL | 1 refills | Status: AC

## 2024-09-07 NOTE — Progress Notes (Signed)
 09/07/2024 Name: Erin Cunningham MRN: [id number] DOB: 1941/03/12  Chief Complaint  Patient presents with   Medication Management    Hypertension     Erin Cunningham is a 83 y.o. year old female who presented for a telephone visit.   They were referred to the pharmacist by their PCP for assistance in managing hypertension.    Subjective  Erin Cunningham is an 83 year old female referred for hypertension management and medication adherence. She has a medical history significant for:  aortic stenosis, depression, allergic rhinitis, history of CVA, hypertension, hyperlipidemia, and afib.  Patient reported her blood pressure has been out of control.  Care Team: Primary Care Provider: Caro Harlene POUR, NP  no up coming appointment.  Medication Access/Adherence  Current Pharmacy:  [facility] [address] [place] - [address] [place] [address] Phone: [phone] Fax: [phone]  [id number] - [place], [place] - [address] AT [facility] [address] [facility] [address] [place] [address] Phone: [phone] Fax: [phone]   Patient reports affordability concerns with their medications: No  Patient reports access/transportation concerns to their pharmacy: No  Patient reports adherence concerns with their medications:  Yes  had not been taking carvedilol    Hypertension:  Current medications: carvedilol  3.125 mg 1 tablet twice daily and Losartan  100 mg 1 tablet daily  Patient denies hypotensive s/sx including  dizziness, lightheadedness.  Patient denies hypertensive symptoms including  headache, chest pain, shortness of breath  Patient reported her blood pressure as 189/114, 187/110, 145/70 over the past week.  She had not been taking carvedilol .   Objective:  Lab Results  Component Value Date   HGBA1C 5.6 05/29/2020    Lab Results  Component Value Date   CREATININE 0.91 07/28/2024   BUN  18 07/28/2024   NA 141 07/28/2024   K 4.5 07/28/2024   CL 109 07/28/2024   CO2 25 07/28/2024    Lab Results  Component Value Date   CHOL 149 01/24/2024   HDL 47 (L) 01/24/2024   LDLCALC 81 01/24/2024   TRIG 116 01/24/2024   CHOLHDL 3.2 01/24/2024    Medications Reviewed Today     Reviewed by Erin Cunningham, RPH (Pharmacist) on 09/07/24 at 1246  Med List Status: <None>   Medication Order Taking? Sig Documenting Provider Last Dose Status Informant  atorvastatin  (LIPITOR ) 80 MG tablet [id number] Yes Take 1 tablet (80 mg total) by mouth daily. Ngetich, Dinah C, NP  Active   carvedilol  (COREG ) 3.125 MG tablet [id number] Yes Take 1 tablet (3.125 mg total) by mouth 2 (two) times daily with a meal. Caro Harlene POUR, NP  Active   losartan  (COZAAR ) 100 MG tablet [id number] Yes Take 1 tablet (100 mg total) by mouth daily. Ngetich, Dinah C, NP  Active   rivaroxaban  (XARELTO ) 20 MG TABS tablet [id number] Yes Take 1 tablet (20 mg total) by mouth daily with supper. Caro Harlene POUR, NP  Active   sertraline  (ZOLOFT ) 100 MG tablet [id number] Yes Take 1 tablet (100 mg total) by mouth daily. Curry Leni DASEN, MD  Active   VITAMIN D  PO [id number] Yes Take 1 tablet by mouth daily. [provider]  Active Self, Pharmacy Records              Assessment/Plan:   Hypertension: - Currently uncontrolled - Reviewed long term cardiovascular and renal outcomes of uncontrolled blood pressure - Reviewed appropriate blood pressure  monitoring technique and reviewed goal blood pressure. Recommended to check home blood pressure and heart rate 2-3 times daily - Recommend to restart Carvedilol  3.125 mg 1 tablet twice daily -Sent message to PCP with refill for cosignature and to let them know about Patient's blood pressure.   Follow Up Plan:    Call Patient in 3-5 business days.   Cassius DOROTHA Brought, PharmD, BCACP Clinical Pharmacist [phone]

## 2024-09-20 ENCOUNTER — Telehealth: Payer: Self-pay | Admitting: Pharmacist

## 2024-09-20 DIAGNOSIS — I161 Hypertensive emergency: Secondary | ICD-10-CM

## 2024-09-20 NOTE — Progress Notes (Signed)
   09/20/2024  Patient ID: Erin Cunningham, female   DOB: 10/23/1941, 83 y.o.   MRN: [id number]  Patient was called to follow up on her blood pressure. Carvedilol  3.125 mg was re-started on 09/09/2024.  Verification of pick up was found in Dr. Annemarie. Unfortunately, Patient did not answer her phone.   No upcoming appointments scheduled.  Plan: Follow up with Patient in 1 month.   Cassius DOROTHA Brought, PharmD, BCACP Clinical Pharmacist [phone]

## 2024-10-05 ENCOUNTER — Ambulatory Visit: Payer: Self-pay

## 2024-10-05 NOTE — Telephone Encounter (Signed)
 Please make sure patient takes all her prescribed medication prior to visit tomorrow

## 2024-10-05 NOTE — Telephone Encounter (Signed)
 Noted

## 2024-10-05 NOTE — Telephone Encounter (Signed)
 Contacted patient to inform her to take all of her prescribed medication prior to visit tmr/ patient verbalized her understanding and stated that she will be here in the morning.

## 2024-10-05 NOTE — Telephone Encounter (Signed)
 Patient disconnected prior to transfer from PAS. Attempted to reach patient regarding elevated BP. No answer and VM was full so unable to leave message to call back   Copied from CRM #[id number]. Topic: Clinical - Red Word Triage >> Oct 05, 2024 10:22 AM Farrel B wrote: [facility] that prompted transfer to Nurse Triage: ranging between 224 to 184, I attempted to get the lower number she asked how long would it take for someone to notice her elevated bp before she dies, I attempted to let her know my reason for the question was to assist her with getting the proper care and she got a little angry

## 2024-10-05 NOTE — Telephone Encounter (Signed)
 FYI Only or Action Required?: FYI only for provider: appointment scheduled on 10/06/2024.  Patient was last seen in primary care on 08/21/2024 by Ngetich, Roxan BROCKS, NP.  Called Nurse Triage reporting Hypertension.  Symptoms began about a month ago.  Interventions attempted: Prescription medications: BP meds.  Symptoms are: gradually worsening.  Triage Disposition: See Physician Within 24 Hours (overriding See PCP When Office is Open (Within 3 Days))  Patient/caregiver understands and will follow disposition?: Yes  Reason for Disposition  Systolic BP >= 160 OR Diastolic >= 100  Answer Assessment - Initial Assessment Questions She states her BP has been elevated for the last 3-4 weeks. Patient denies blurred vision, chest pain, difficulty breathing, headache, or weakness.     1. BLOOD PRESSURE: What is your blood pressure? Did you take at least two measurements 5 minutes apart?     170/104 2. ONSET: When did you take your blood pressure?     Aproximately 1100  3. HOW: How did you take your blood pressure? (e.g., automatic home BP monitor, visiting nurse)     Nurse  4. HISTORY: Do you have a history of high blood pressure?     Yes  5. MEDICINES: Are you taking any medicines for blood pressure? Have you missed any doses recently?     Yes takes meds for BP, has not missed any doses 6. OTHER SYMPTOMS: Do you have any symptoms? (e.g., blurred vision, chest pain, difficulty breathing, headache, weakness)     Loss of memory at times that is ongoing and feels wobbly when walking that is ongoing  Protocols used: Blood Pressure - High-A-AH

## 2024-10-06 ENCOUNTER — Telehealth: Payer: Self-pay

## 2024-10-06 ENCOUNTER — Ambulatory Visit (INDEPENDENT_AMBULATORY_CARE_PROVIDER_SITE_OTHER): Admitting: Nurse Practitioner

## 2024-10-06 ENCOUNTER — Encounter: Payer: Self-pay | Admitting: Nurse Practitioner

## 2024-10-06 VITALS — BP 154/98 | HR 53 | Temp 97.5°F | Ht 63.0 in | Wt 159.2 lb

## 2024-10-06 DIAGNOSIS — E785 Hyperlipidemia, unspecified: Secondary | ICD-10-CM

## 2024-10-06 DIAGNOSIS — F332 Major depressive disorder, recurrent severe without psychotic features: Secondary | ICD-10-CM

## 2024-10-06 DIAGNOSIS — Z23 Encounter for immunization: Secondary | ICD-10-CM | POA: Diagnosis not present

## 2024-10-06 DIAGNOSIS — I10 Essential (primary) hypertension: Secondary | ICD-10-CM | POA: Diagnosis not present

## 2024-10-06 DIAGNOSIS — I480 Paroxysmal atrial fibrillation: Secondary | ICD-10-CM

## 2024-10-06 MED ORDER — AMLODIPINE BESYLATE 5 MG PO TABS: 5.0000 mg | ORAL_TABLET | Freq: Every day | ORAL | 0 refills | Status: DC

## 2024-10-06 NOTE — Telephone Encounter (Signed)
 Tried contacting patient after speaking with agent just now. Called patient 4xs and no answer / unable to leave vm. Agent was trying to transfer pt over pt disconnected from call. If pt calls back please explain to her that we have no openings available for her to be seen today with either NP in office. Tried contacting patient earlier before appt time to let her know her pcp stated she could come in but had no answer and unable to leave voicemail.

## 2024-10-06 NOTE — Telephone Encounter (Signed)
 Tried to contact patient. Missed phonecall due to being on call with another patient. Called patient 2xs no answer unable to leave voicemail due to box being full.

## 2024-10-06 NOTE — Progress Notes (Signed)
 Careteam: Patient Care Team: Caro Harlene POUR, NP as PCP - General (Geriatric Medicine) Lonni Slain, MD as Consulting Physician (Cardiology)  PLACE OF SERVICE:  [facility]  Advanced Directive information    No Known Allergies  Chief Complaint  Patient presents with   Hypertension    Elevated blood pressure. Headache right sided    Epistaxis    It was a very mild case per patient. Once she wiped her nose it stopped    HPI:  Discussed the use of AI scribe software for clinical note transcription with the patient, who gave verbal consent to proceed.  History of Present Illness Erin Cunningham is an 83 year old female with hypertension and atrial fibrillation who presents with uncontrolled blood pressure.  She has a history of uncontrolled hypertension with consistently high blood pressure readings, including 171/134 mmHg last night, 188/89 mmHg this morning, 200 mmHg at work, and 224 mmHg recorded by EMS. She experienced epistaxis this morning, which she managed to stop. Was not a lot of bleeding, small amount.   She is anxious about her high blood pressure and has stopped keeping a blood pressure log due to anxiety over the high readings.  Her current medications include losartan  100 mg daily, carvedilol  3.125 mg twice daily, and atorvastatin , daily in am. She confirms adherence to her medication regimen, taking them every morning around 9 AM. She also takes Xarelto  once daily for atrial fibrillation.  She has a history of anxiety and depression, for which she is prescribed Zoloft  100 mg daily, but she does not take it consistently.  No headaches, blurred vision, chest pain, or leg swelling. She reports a slight headache and a sensation of wobbling when walking, but no falls have occurred. She has a walker or cane available in her garage if needed.      Review of Systems:  Review of Systems  Constitutional:  Negative for chills, fever and weight loss.  HENT:   Negative for tinnitus.   Respiratory:  Negative for cough, sputum production and shortness of breath.   Cardiovascular:  Negative for chest pain, palpitations and leg swelling.  Gastrointestinal:  Negative for abdominal pain, constipation, diarrhea and heartburn.  Genitourinary:  Negative for dysuria, frequency and urgency.  Musculoskeletal:  Negative for back pain, falls, joint pain and myalgias.  Skin: Negative.   Neurological:  Positive for headaches. Negative for dizziness and weakness.  Psychiatric/Behavioral:  Negative for depression and memory loss. The patient does not have insomnia.    Past Medical History:  Diagnosis Date   Atrial fibrillation (HCC)    Breast CA (HCC) 2006   right ([place])   Cancer (HCC)    Helicobacter pylori gastritis 2001   High blood pressure    History of adenomatous polyp of colon 09/23/2017   Oma 09/25/2017 and apparently polyps in the past as well though recalled age   History of shingles    Major depressive disorder 05/28/2020   Major depressive disorder, recurrent, severe without psychotic features (HCC) 06/24/2015   Parotid mass 2014   right benign cyst   Personal history of radiation therapy 2006   Rt breast   Stroke (cerebrum) (HCC) 06/28/2020   Per patient   Wears glasses    Past Surgical History:  Procedure Laterality Date   ABDOMINAL HYSTERECTOMY  1982   BSO   BREAST LUMPECTOMY Right 2007   BREAST LUMPECTOMY WITH SENTINEL LYMPH NODE BIOPSY  2005   right   BREAST SURGERY  2004  rt br mass   CESAREAN SECTION     COLONOSCOPY     ESOPHAGOGASTRODUODENOSCOPY  2001   H pylori gastritis   EYE SURGERY     both cataracts   PAROTIDECTOMY Right 09/05/2013   Procedure: RIGHT SUPERFICIAL PAROTIDECTOMY WITH FACIAL NERVE DISECTION;  Surgeon: Lonni FORBES Angle, MD;  Location: [facility];  Service: ENT;  Laterality: Right;  All documentation done by R.Ward after [phone] --done under G. Garzon's password   RE-EXCISION OF BREAST  LUMPECTOMY  2005   right   Social History:   reports that she has never smoked. Her smokeless tobacco use includes chew. She reports current alcohol use. She reports that she does not use drugs.  Family History  Problem Relation Age of Onset   Heart disease Mother        MI   Cancer Father        bone   Thyroid  disease Neg Hx    Hypercalcemia Neg Hx    Colon cancer Neg Hx    Stomach cancer Neg Hx    Rectal cancer Neg Hx    Esophageal cancer Neg Hx     Medications: Patient's Medications  New Prescriptions   No medications on file  Previous Medications   ATORVASTATIN  (LIPITOR ) 80 MG TABLET    Take 1 tablet (80 mg total) by mouth daily.   CARVEDILOL  (COREG ) 3.125 MG TABLET    Take 1 tablet (3.125 mg total) by mouth 2 (two) times daily with a meal.   LOSARTAN  (COZAAR ) 100 MG TABLET    Take 1 tablet (100 mg total) by mouth daily.   RIVAROXABAN  (XARELTO ) 20 MG TABS TABLET    Take 1 tablet (20 mg total) by mouth daily with supper.   SERTRALINE  (ZOLOFT ) 100 MG TABLET    Take 1 tablet (100 mg total) by mouth daily.   VITAMIN D  PO    Take 1 tablet by mouth daily.  Modified Medications   No medications on file  Discontinued Medications   No medications on file    Physical Exam:  Vitals:   10/05/24 1522 10/06/24 1022  BP: (!) 164/102 (!) 154/98  Pulse: (!) 53   Temp: (!) 97.5 F (36.4 C)   TempSrc: Temporal   SpO2: 98%   Weight: 159 lb 3.2 oz (72.2 kg)   Height: 5' 3 (1.6 m)    Body mass index is 28.2 kg/m. Wt Readings from Last 3 Encounters:  10/05/24 159 lb 3.2 oz (72.2 kg)  08/21/24 157 lb 3.2 oz (71.3 kg)  07/28/24 159 lb 6.4 oz (72.3 kg)    Physical Exam Constitutional:      General: She is not in acute distress.    Appearance: She is well-developed. She is not diaphoretic.  HENT:     Head: Normocephalic and atraumatic.     Mouth/Throat:     Pharynx: No oropharyngeal exudate.  Eyes:     Conjunctiva/sclera: Conjunctivae normal.     Pupils: Pupils are  equal, round, and reactive to light.  Cardiovascular:     Rate and Rhythm: Normal rate and regular rhythm.     Heart sounds: Normal heart sounds.  Pulmonary:     Effort: Pulmonary effort is normal.     Breath sounds: Normal breath sounds.  Abdominal:     General: Bowel sounds are normal.     Palpations: Abdomen is soft.  Musculoskeletal:     Cervical back: Normal range of motion and neck supple.  Right lower leg: No edema.     Left lower leg: No edema.  Skin:    General: Skin is warm and dry.  Neurological:     Mental Status: She is alert.  Psychiatric:        Mood and Affect: Mood normal.     Labs reviewed: Basic Metabolic Panel: Recent Labs    01/24/24 0942 07/28/24 0952  NA 141 141  K 4.5 4.5  CL 106 109  CO2 29 25  GLUCOSE 96 88  BUN 11 18  CREATININE 0.80 0.91  CALCIUM  10.9* 10.5*  TSH  --  1.64   Liver Function Tests: Recent Labs    01/24/24 0942 07/28/24 0952  AST 11 13  ALT 7 6  BILITOT 0.5 0.5  PROT 6.3 6.1   No results for input(s): LIPASE, AMYLASE in the last 8760 hours. No results for input(s): AMMONIA in the last 8760 hours. CBC: Recent Labs    01/24/24 0942 07/28/24 0952  WBC 5.1 5.3  NEUTROABS 2,882 2,502  HGB 13.2 12.6  HCT 41.4 38.9  MCV 89.8 89.4  PLT 306 325   Lipid Panel: Recent Labs    01/24/24 0942  CHOL 149  HDL 47*  LDLCALC 81  TRIG 883  CHOLHDL 3.2   TSH: Recent Labs    07/28/24 0952  TSH 1.64   A1C: Lab Results  Component Value Date   HGBA1C 5.6 05/29/2020     Assessment/Plan  Encounter for immunization -     Flu vaccine HIGH DOSE PF(Fluzone Trivalent)  Assessment and Plan Assessment & Plan Hypertension, uncontrolled Blood pressure remains uncontrolled with losartan  and carvedilol . Recent readings: 171/134 mmHg and 188/89 mmHg.  - Add amlodipine 5 mg daily in the morning. - Continue losartan  100 mg daily in the morning. - Continue carvedilol  3.125 mg twice daily. - Monitor blood  pressure at home. - Follow up in 4 weeks for blood pressure evaluation. - Advise to call if blood pressure remains high with symptoms such as headache or chest pain.  Atrial fibrillation, on anticoagulation Rate controlled on carvediolol 3.125 mg BID Currently on Xarelto  for anticoagulation. - Continue Xarelto  as prescribed.  Hyperlipidemia Currently managed with atorvastatin . - Continue atorvastatin  as prescribed.  Major depressive disorder Not taking Zoloft  daily as prescribed, affecting mood stability. Advised to take medication consistently. - Take Zoloft  100 mg daily in the morning.    Return in about 4 weeks (around 11/03/2024) for blood pressure.  Kincade Granberg K. Caro BODILY [facility] [phone]

## 2024-10-06 NOTE — Telephone Encounter (Signed)
 Copied from CRM [id number]. Topic: Clinical - Medical Advice >> Oct 06, 2024  8:52 AM Graeme ORN wrote: Reason for CRM: Patient called. Has appt at 9:20.Patient states she's had a light nose bleed and is not feeling well. Would like to know if she should come in or go to hospital. Called CAL - transferred to clinical voicemail. Patient disconnected. Attempted to call her back - no answer. Thank You    ----------------------------------------------------------------------- From previous Reason for Contact - Scheduling Inquiry for Clinic: Reason for CRM:    ----------------------------------------------------------------------- From previous Reason for Contact - Scheduling: Patient/patient representative is calling to schedule an appointment. Refer to attachments for appointment information.

## 2024-10-06 NOTE — Patient Instructions (Addendum)
 Take amlodipine (new) 5 mg by mouth daily in morning ADD this to  Coreg  3.125 mg twice daily (1 in am and 1 in PM) Losartan  100 mg daily  (in AM) Zoloft  100 mg daily (in AM)  In the PM- TAKE AT SUPPER coreg  3.125 (this is twice daily) atorvastatin  1 mg daily (in PM Xarelto  20 mg daily (in PM)  To check blood pressure 1 hour AFTER you have had your medication Make sure you have been sitting at least 5 mins  Record and let us  know.  Goal <140/90

## 2024-10-06 NOTE — Telephone Encounter (Signed)
 Copied from CRM #[id number]. Topic: General - Other >> Oct 06, 2024  9:21 AM Farrel B wrote: Reason for CRM: AMEILA WELDON [phone] [facility]), patient called in I was able to verify her PHI I asked how could I assist her, she requested that I inform the provider that she was on her way to the hospital, her blood pressure just now was 217/114, patient called yesterday for assistance [place] due to hypertension she stated that the blood pressure was not working, I advised her that I would have the NT assist her in which she was scheduled an appt by NT, stated she was on her way to the hospital but was extremely upset at all involved it also shows she called spoke with me at 10:24, 10:27 and spoke with another agent @ 11:05

## 2024-10-12 ENCOUNTER — Telehealth: Payer: Self-pay

## 2024-10-12 NOTE — Telephone Encounter (Signed)
 Another outgoing call placed to patient, no answer, and voicemail full. We will need to make additional attempts to reach patient.

## 2024-10-12 NOTE — Telephone Encounter (Signed)
 Copied from CRM [id number]. Topic: Clinical - Medical Advice >> Oct 12, 2024 11:14 AM Diannia H wrote: Reason for CRM: Patient was calling to let provider know her blood pressure is reading 180/84 around 6am this morning. Patient has not checked it since then. Could you assist? Patients callback number is [phone]. She is not feeling bad or anything at this moment.

## 2024-10-12 NOTE — Telephone Encounter (Signed)
 Outgoing call placed to patient, no answer, and voicemail full. We will need to make additional attempts to reach patient.

## 2024-10-12 NOTE — Telephone Encounter (Signed)
 Had she taken her medication at that time- would recommend her taking her medication and rechecking an hour later after she is has been sitting for at least 5 minutes

## 2024-10-13 MED ORDER — AMLODIPINE BESYLATE 10 MG PO TABS: 10.0000 mg | ORAL_TABLET | Freq: Every day | ORAL | 1 refills | Status: AC

## 2024-10-13 NOTE — Telephone Encounter (Signed)
 Outgoing call placed to patient, no answer, and voicemail full. We will need to make additional attempts to reach patient.   When patient returns call E2C2 to  1.) Share providers response below 2.) Schedule patient for a b/p follow-up for next week (with any office provider) 3.) Inform patient new rx sent to pharmacy (although she can take 2 of the current 5 mg tablet of norvasc/amlodipine to equal 10 mg daily)

## 2024-10-13 NOTE — Telephone Encounter (Addendum)
 Patient states she does not take mediation until the evening because it causes her to feel nauseated and dizzy. Please advise  B/P this morning was 189/112 (before leaving home), took medication about 7:30 pm yesterday. Patient states she also has slight headaches off/on that last a few minutes (feels like a little needle pricking). Patient also mentioned the unsteadiness on her foot is progressively getting worse.  Patient had nurse at work take b/p after sitting for 5 minutes and it was 200/92  Please advise

## 2024-10-13 NOTE — Telephone Encounter (Signed)
 To increase norvasc to 10 mg daily and make follow up appt next week- if bp stays this high with additional dose would recommend going to urgent care or ED

## 2024-10-17 NOTE — Telephone Encounter (Signed)
 Noted

## 2024-10-17 NOTE — Telephone Encounter (Signed)
 Would recommend her taking the prescribed dose- start with 5 mg daily and take as prescribed

## 2024-10-17 NOTE — Telephone Encounter (Signed)
 Contacted patient and informed her of Caro Harlene POUR, NP response. Patient understood and stated she will and had no further questions at this time.

## 2024-10-17 NOTE — Telephone Encounter (Signed)
 Spoke with patient, patient stated she had a confession  I never picked up the amlodipine 5 mg due to limited funds.   Patient stated she feels good today and will check blood pressure when she gets home. Patient also stated she cleared her voicemail. Please advise if patient to take 5 or 10 mg of amlodipine

## 2024-10-30 ENCOUNTER — Telehealth: Payer: Self-pay | Admitting: Pharmacist

## 2024-10-30 NOTE — Progress Notes (Signed)
   10/30/2024  Patient ID: Erin Cunningham, female   DOB: Jul 05, 1941, 83 y.o.   MRN: [id number]  Patient was called regarding her blood pressure medications. Unfortunately, she did not answer the phone and her voicemail was not set up.   She admitted to her provider she never started amlodipine  5 mg.  Amlodipine  10 mg is on her medication list but there is no pharmacy fill data to support it being filled.    Carvedilol  3.125 was filled 09/22/24 for a 90 day supply.     10/06/2024   10:22 AM 10/05/2024    3:22 PM 08/28/2024    8:07 AM  Vitals with BMI  Height  5' 3 5' 3  Weight  159 lbs 3 oz   BMI  28.21   Systolic 154 164   Diastolic 98 102   Pulse  53     Erin Cunningham, PharmD, [facility] Clinical Pharmacist [phone]

## 2024-11-13 ENCOUNTER — Ambulatory Visit: Payer: Self-pay | Admitting: Nurse Practitioner

## 2024-11-26 ENCOUNTER — Other Ambulatory Visit: Payer: Self-pay | Admitting: Family

## 2024-11-26 DIAGNOSIS — I10 Essential (primary) hypertension: Secondary | ICD-10-CM

## 2024-11-27 ENCOUNTER — Telehealth: Payer: Self-pay | Admitting: Pharmacist

## 2024-11-27 ENCOUNTER — Ambulatory Visit: Admitting: Nurse Practitioner

## 2024-11-27 ENCOUNTER — Telehealth: Payer: Self-pay

## 2024-11-27 ENCOUNTER — Encounter: Payer: Self-pay | Admitting: Nurse Practitioner

## 2024-11-27 VITALS — BP 160/94 | HR 70 | Temp 97.1°F | Ht 63.0 in

## 2024-11-27 DIAGNOSIS — I480 Paroxysmal atrial fibrillation: Secondary | ICD-10-CM

## 2024-11-27 DIAGNOSIS — I10 Essential (primary) hypertension: Secondary | ICD-10-CM | POA: Diagnosis not present

## 2024-11-27 DIAGNOSIS — Z79899 Other long term (current) drug therapy: Secondary | ICD-10-CM

## 2024-11-27 NOTE — Patient Instructions (Signed)
 Make sure to bring your medication to every visit  Pick up xarelto  20 mg daily- need to take this every day with supper to help prevent heart attack or stroke due to blood clot from your a fib  Make sure you are taking norvasc  - would start with 5 mg daily if you are not already taking Losartan  100 mg daily  AND coreg  (carvediolol) twice daily

## 2024-11-27 NOTE — Telephone Encounter (Signed)
 Copied from CRM [id number]. Topic: Clinical - Prescription Issue >> Nov 27, 2024  3:13 PM DeAngela L wrote: Reason for CRM: patient is calling cause her prescription was $47 XARELTO  the patient did get the medication but she does states she can't afford the price of $47   And also patient would like to ask if the provider has found any samples ? she left on accident before she found out about the samples  Patient still have not taken her medication yet today, she wanted to let the provider know this  Pt num  [phone]   [id number] - [place], [place] - [address] AT [facility] [address] [place] [address] Phone: [phone] Fax: [phone]

## 2024-11-27 NOTE — Progress Notes (Signed)
 Patient ID: Erin Cunningham, female   DOB: 12-17-1940, 83 y.o.   MRN: [id number]  Patient was called regarding medication adherence.  HIPAA identifiers were obtained.  Patient's PCP sent a message stating the Patient was very confused about her medication regimen and that Xarelto  was too expensive for her.    Erin Cunningham is an 83 year old female with multiple medical conditions including but not limited to:  aortic atherosclerosis, hypertension, history of CVA, atrial fibrillation, depression, and hyperlipidemia.    Patient was seen today by her PCP.  She was confused about her medication regimen during her appointment. Unfortunately, Patient has been called multiple times unsuccessfully.    During our phone call today, the Patient's medications were reviewed:  Medications Reviewed Today     Reviewed by Jolee Cassius PARAS, [facility] (Pharmacist) on 11/27/24 at 1726  Med List Status: <None>   Medication Order Taking? Sig Documenting Provider Last Dose Status Informant  amLODipine  (NORVASC ) 10 MG tablet [id number] Yes Take 1 tablet (10 mg total) by mouth daily. Caro Harlene POUR, NP  Active   atorvastatin  (LIPITOR ) 80 MG tablet [id number] Yes Take 1 tablet (80 mg total) by mouth daily. Ngetich, Dinah C, NP  Active   carvedilol  (COREG ) 3.125 MG tablet [id number] Yes Take 1 tablet (3.125 mg total) by mouth 2 (two) times daily with a meal. Caro Harlene POUR, NP  Active   losartan  (COZAAR ) 100 MG tablet [id number] Yes TAKE 1 TABLET(100 MG) BY MOUTH DAILY Eubanks, Jessica K, NP  Active   rivaroxaban  (XARELTO ) 20 MG TABS tablet [id number] Yes Take 1 tablet (20 mg total) by mouth daily with supper. Caro Harlene POUR, NP  Active   sertraline  (ZOLOFT ) 100 MG tablet [id number] Yes Take 1 tablet (100 mg total) by mouth daily. Curry Leni DASEN, MD  Active   VITAMIN D  PO [id number] Yes Take 1 tablet by mouth daily. [provider]  Active Self, Pharmacy Records           Patient said she picked up Amlodipine  10  mg today.  She also said she picked up Xaretto.  She reported her Xarelto  copay was $47 and that was cost prohibitive for her.  A patient assistance financial assessment was completed. Patient is over income for LIS/Extra Help.  She may qualify to get Xarelto  through [employer] Patient Assistance Program. They do not require Patients to meet an out-of-pocket expense to qualify if the household income is less than $50,000.   Plan: Call Patient back in 1 week to check on her blood pressure. Send note to Luke Cecille Sola to send application to Patient for Xarelto .  Cassius DOROTHA Jolee, PharmD, [facility] Clinical Pharmacist [phone]

## 2024-11-27 NOTE — Progress Notes (Signed)
 Patient ID: Erin Cunningham, female   DOB: 27-Nov-1941, 83 y.o.   MRN: [id number]  Patient was called to follow up on her blood pressure medications and to do a medication review as her provider sent a message stating the Patient was confused about her medications at her last visit. Unfortunately, she did not answer her phone and her voicemail box was full.  HIPAA compliant message could not be left on her voicemail.   Reviewed Fill History:  Plan: Call Patient back in 3-5 business days.   Cassius DOROTHA Brought, PharmD, BCACP Clinical Pharmacist [phone]

## 2024-11-27 NOTE — Telephone Encounter (Signed)
 We do not have any samples of xarelto , Cassius can you help with this?  Dementria-  Also can we follow up on her blood pressure medication She was supposed to confirm what she was actually taking when she got home Thank you

## 2024-11-27 NOTE — Progress Notes (Signed)
 "   Careteam: Patient Care Team: Caro Harlene POUR, NP as PCP - General (Geriatric Medicine) Lonni Slain, MD as Consulting Physician (Cardiology)  PLACE OF SERVICE:  [facility]  Advanced Directive information    Allergies[1]  Chief Complaint  Patient presents with   Hypertension    4 week follow-up on blood pressure. Patient states someone upset her yesterday and her B/P has been elevated since. Patient not sure if taking amlodipine . Did not take b.p medication this morning, usually takes about 5-5:30 pm     HPI:  Discussed the use of AI scribe software for clinical note transcription with the patient, who gave verbal consent to proceed.  History of Present Illness Erin Cunningham is an 83 year old female with hypertension who presents for follow-up of her high blood pressure.  She takes amlodipine  10 mg daily, carvedilol  3.125 mg twice daily, and losartan  100 mg daily for blood pressure management. However, she does not consistently take carvedilol  twice daily as prescribed, often skipping the morning dose due to work commitments and taking both doses in the evening instead.   She has also run out of Xarelto , which she uses for atrial fibrillation, but plans to refill it today.  She monitors her blood pressure at home and reports readings often in the 160s, with a concerning reading of 200/119 mmHg this morning. She attributes some of the high readings to improper arm positioning during measurement. She experiences dizziness when taking her medications, which affects her adherence, particularly in the morning. She also describes feeling unsteady and needing to hold onto walls when walking.  She recalls a conversation with a pharmacist about a month ago regarding her medication management but is unsure about the details. She is confused about whether she is taking amlodipine  as she did not bring her medications to the visit for verification.   Review of Systems:  Review  of Systems  Constitutional:  Negative for chills, fever and weight loss.  HENT:  Negative for tinnitus.   Respiratory:  Negative for cough, sputum production and shortness of breath.   Cardiovascular:  Negative for chest pain, palpitations and leg swelling.  Gastrointestinal:  Negative for abdominal pain, constipation, diarrhea and heartburn.  Genitourinary:  Negative for dysuria, frequency and urgency.  Musculoskeletal:  Negative for back pain, falls, joint pain and myalgias.  Skin: Negative.   Neurological:  Positive for dizziness. Negative for headaches.  Psychiatric/Behavioral:  Negative for depression and memory loss. The patient does not have insomnia.     Past Medical History:  Diagnosis Date   Atrial fibrillation (HCC)    Breast CA (HCC) 2006   right ([place])   Cancer (HCC)    Helicobacter pylori gastritis 2001   High blood pressure    History of adenomatous polyp of colon 09/23/2017   Oma 09/25/2017 and apparently polyps in the past as well though recalled age   History of shingles    Major depressive disorder 05/28/2020   Major depressive disorder, recurrent, severe without psychotic features (HCC) 06/24/2015   Parotid mass 2014   right benign cyst   Personal history of radiation therapy 2006   Rt breast   Stroke (cerebrum) (HCC) 06/28/2020   Per patient   Wears glasses    Past Surgical History:  Procedure Laterality Date   ABDOMINAL HYSTERECTOMY  1982   BSO   BREAST LUMPECTOMY Right 2007   BREAST LUMPECTOMY WITH SENTINEL LYMPH NODE BIOPSY  2005   right   BREAST SURGERY  2004   rt br mass   CESAREAN SECTION     COLONOSCOPY     ESOPHAGOGASTRODUODENOSCOPY  2001   H pylori gastritis   EYE SURGERY     both cataracts   PAROTIDECTOMY Right 09/05/2013   Procedure: RIGHT SUPERFICIAL PAROTIDECTOMY WITH FACIAL NERVE DISECTION;  Surgeon: Lonni FORBES Angle, MD;  Location: [facility];  Service: ENT;  Laterality: Right;  All documentation done by R.Ward  after [phone] --done under G. Garzon's password   RE-EXCISION OF BREAST LUMPECTOMY  2005   right   Social History:   reports that she has never smoked. Her smokeless tobacco use includes chew. She reports current alcohol use. She reports that she does not use drugs.  Family History  Problem Relation Age of Onset   Heart disease Mother        MI   Cancer Father        bone   Thyroid  disease Neg Hx    Hypercalcemia Neg Hx    Colon cancer Neg Hx    Stomach cancer Neg Hx    Rectal cancer Neg Hx    Esophageal cancer Neg Hx     Medications: Patient's Medications  New Prescriptions   No medications on file  Previous Medications   AMLODIPINE  (NORVASC ) 10 MG TABLET    Take 1 tablet (10 mg total) by mouth daily.   ATORVASTATIN  (LIPITOR ) 80 MG TABLET    Take 1 tablet (80 mg total) by mouth daily.   CARVEDILOL  (COREG ) 3.125 MG TABLET    Take 1 tablet (3.125 mg total) by mouth 2 (two) times daily with a meal.   LOSARTAN  (COZAAR ) 100 MG TABLET    TAKE 1 TABLET(100 MG) BY MOUTH DAILY   RIVAROXABAN  (XARELTO ) 20 MG TABS TABLET    Take 1 tablet (20 mg total) by mouth daily with supper.   SERTRALINE  (ZOLOFT ) 100 MG TABLET    Take 1 tablet (100 mg total) by mouth daily.   VITAMIN D  PO    Take 1 tablet by mouth daily.  Modified Medications   No medications on file  Discontinued Medications   No medications on file    Physical Exam:  Vitals:   11/27/24 0827 11/27/24 0843  BP: (!) 164/96 (!) 160/94  Pulse: 70   Temp: (!) 97.1 F (36.2 C)   SpO2: 98%   Height: 5' 3 (1.6 m)    Body mass index is 28.2 kg/m. Wt Readings from Last 3 Encounters:  10/05/24 159 lb 3.2 oz (72.2 kg)  08/21/24 157 lb 3.2 oz (71.3 kg)  07/28/24 159 lb 6.4 oz (72.3 kg)    Physical Exam Constitutional:      General: She is not in acute distress.    Appearance: She is well-developed. She is not diaphoretic.  HENT:     Head: Normocephalic and atraumatic.     Mouth/Throat:     Pharynx: No oropharyngeal  exudate.  Eyes:     Conjunctiva/sclera: Conjunctivae normal.     Pupils: Pupils are equal, round, and reactive to light.  Cardiovascular:     Rate and Rhythm: Normal rate and regular rhythm.     Heart sounds: Normal heart sounds.  Pulmonary:     Effort: Pulmonary effort is normal.     Breath sounds: Normal breath sounds.  Abdominal:     General: Bowel sounds are normal.     Palpations: Abdomen is soft.  Musculoskeletal:     Cervical back: Normal range of  motion and neck supple.     Right lower leg: No edema.     Left lower leg: No edema.  Skin:    General: Skin is warm and dry.  Neurological:     Mental Status: She is alert.  Psychiatric:        Mood and Affect: Mood normal.     Labs reviewed: Basic Metabolic Panel: Recent Labs    01/24/24 0942 07/28/24 0952  NA 141 141  K 4.5 4.5  CL 106 109  CO2 29 25  GLUCOSE 96 88  BUN 11 18  CREATININE 0.80 0.91  CALCIUM  10.9* 10.5*  TSH  --  1.64   Liver Function Tests: Recent Labs    01/24/24 0942 07/28/24 0952  AST 11 13  ALT 7 6  BILITOT 0.5 0.5  PROT 6.3 6.1   No results for input(s): LIPASE, AMYLASE in the last 8760 hours. No results for input(s): AMMONIA in the last 8760 hours. CBC: Recent Labs    01/24/24 0942 07/28/24 0952  WBC 5.1 5.3  NEUTROABS 2,882 2,502  HGB 13.2 12.6  HCT 41.4 38.9  MCV 89.8 89.4  PLT 306 325   Lipid Panel: Recent Labs    01/24/24 0942  CHOL 149  HDL 47*  LDLCALC 81  TRIG 883  CHOLHDL 3.2   TSH: Recent Labs    07/28/24 0952  TSH 1.64   A1C: Lab Results  Component Value Date   HGBA1C 5.6 05/29/2020     Assessment/Plan Assessment and Plan Assessment & Plan Hypertension Poorly controlled with home readings in the 160s. She reports dizziness from medications when she takes in the AM and she also reports inconsistent adherence. Not taking carvedilol  as prescribed. Uncertainty about amlodipine  usage. - Ensure carvedilol  is taken twice daily with  meals. - Check blood pressure one hour after taking medication. - Bring all medication bottles to the next visit for review. -discussed norvasc - if she is NOT taking to start with 5 mg daily and monitor bp -to bring bp log, and bp cuff along with medication to next visit.  -she has pharmacy referral- will have them also check in on her  Atrial fibrillation Managed with anticoagulation therapy. Ran out of Xarelto , crucial for stroke prevention. - Pick up Xarelto  prescription from the pharmacy and discussed risk of not taking corrently .  Return in about 4 weeks (around 12/25/2024) for blood pressure- bring medication bottles to appt.  Rick Warnick K. Caro BODILY [facility] [phone]     [1] No Known Allergies  "

## 2024-11-28 ENCOUNTER — Telehealth: Payer: Self-pay

## 2024-11-28 NOTE — Telephone Encounter (Signed)
 PAP: Patient assistance application for Xarelto through [employer] has been mailed to pt's home address on file. Provider portion of application will be faxed to provider's office.

## 2024-11-29 ENCOUNTER — Telehealth: Payer: Self-pay | Admitting: Pharmacist

## 2024-11-29 DIAGNOSIS — I10 Essential (primary) hypertension: Secondary | ICD-10-CM

## 2024-11-29 NOTE — Progress Notes (Signed)
" °  11/29/2024 Name: Erin Cunningham MRN: [id number] DOB: 12-31-1940  Chief Complaint  Patient presents with   Medication Management    Hypertension    Erin Cunningham is a 83 y.o. year old female who presented for a telephone visit.   They were referred to the pharmacist by their PCP for assistance in managing hypertension.    Subjective:  Care Team: Primary Care Provider: Caro Harlene POUR, NP ; Next Scheduled Visit: 01/01/2025   Medication Access/Adherence  Current Pharmacy:  [id number] [place] [place], [place] - [address] AT [facility] [address] [place] [address] Phone: [phone] Fax: [phone]   Patient reports affordability concerns with their medications: No  Patient reports access/transportation concerns to their pharmacy: No  Patient reports adherence concerns with their medications:  Yes  previously was not taking all of her blood pressure medications. Reports taking all of them last night.   Hypertension:  Current medications: Amlodipine  10 mg  daily, Losartan  100 mg daily, Carvedilol  3.125mg  twice daily    Patient has a validated, automated, upper arm home BP cuff Current blood pressure readings readings: 179/97  Patient denies hypotensive s/sx including  dizziness, lightheadedness.  Patient denies hypertensive symptoms including  headache, chest pain, shortness of breath     Objective:  Lab Results  Component Value Date   HGBA1C 5.6 05/29/2020    Lab Results  Component Value Date   CREATININE 0.91 07/28/2024   BUN 18 07/28/2024   NA 141 07/28/2024   K 4.5 07/28/2024   CL 109 07/28/2024   CO2 25 07/28/2024    Lab Results  Component Value Date   CHOL 149 01/24/2024   HDL 47 (L) 01/24/2024   LDLCALC 81 01/24/2024   TRIG 116 01/24/2024   CHOLHDL 3.2 01/24/2024       11/27/2024    8:43 AM 11/27/2024    8:27 AM 10/06/2024   10:22 AM  Vitals with BMI  Height  5' 3   Systolic 160 164  154  Diastolic 94 96 98  Pulse  70        Assessment/Plan:   Hypertension: - Currently uncontrolled - Reviewed long term cardiovascular and renal outcomes of uncontrolled blood pressure - Reviewed appropriate blood pressure monitoring technique and reviewed goal blood pressure. Recommended to check home blood pressure and heart rate two to three times daily - Recommend to commit to taking all medications as prescribed.   (Patient reported just taking her morning meds when I called and requested I call her back prior to the new year.)  Follow Up Plan:    Call Patient back at the beginning of next week.   Cassius DOROTHA Brought, PharmD, BCACP Clinical Pharmacist [phone]    "

## 2024-12-04 ENCOUNTER — Telehealth: Payer: Self-pay | Admitting: Pharmacist

## 2024-12-04 DIAGNOSIS — I10 Essential (primary) hypertension: Secondary | ICD-10-CM

## 2024-12-04 NOTE — Progress Notes (Signed)
" ° °  12/04/2024 Name: Erin Cunningham MRN: [id number] DOB: 11/16/1941  Chief Complaint  Patient presents with   Medication Adherence    Hypertensive medications   Patient was called to follow up on her blood pressure. Unfortunately, she did not answer the phone. HIPAA compliant message could not be left on her voicemail because one did not pick up.   Plan: Call Patient back by the end of the week.  Cassius DOROTHA Brought, PharmD, BCACP Clinical Pharmacist [phone]  "

## 2024-12-05 ENCOUNTER — Telehealth: Payer: Self-pay | Admitting: Pharmacist

## 2024-12-05 DIAGNOSIS — I161 Hypertensive emergency: Secondary | ICD-10-CM

## 2024-12-05 NOTE — Telephone Encounter (Signed)
 Received Provider portion PAP application Xarelto  ([employer])

## 2024-12-05 NOTE — Progress Notes (Signed)
" ° °  12/05/2024 Name: TRINH SANJOSE MRN: [id number] DOB: 05-09-1941  Chief Complaint  Patient presents with   Medication Management    Hypertension   Ms. Sequoya Hogsett is an 83 year old female referred for hypertension management and medication adherence. She has a medical history significant for:  aortic stenosis, depression, allergic rhinitis, history of CVA, hypertension, hyperlipidemia, and afib.   She has had periods of time without some of her medications due to cost.  Her current hypertension regimen includes:  Amlodipine  10 mg daily )last filled 11/27/24 90 day supply)--confirmed picked up by [facility]  Losartan  100 mg daily filled 10/31/24 and picked up. Also filled 11/27/24 but that was not picked up yet.  Carvedilol  3.125 mg 1 tablet twice daily --picked up 09/23/24  [facility] also confirmed Patient picked up the Xarelto  (she had a lapse in therapy for this due to cost.  She was screened for Patient Assistance and an application has been mailed to her for completion.)   Roxana Lai J. Kaleiyah Polsky, PharmD, BCACP Clinical Pharmacist [phone]     "

## 2024-12-08 NOTE — Telephone Encounter (Signed)
 Thank you :)

## 2024-12-12 ENCOUNTER — Telehealth: Payer: Self-pay | Admitting: Pharmacist

## 2024-12-12 DIAGNOSIS — I161 Hypertensive emergency: Secondary | ICD-10-CM

## 2024-12-12 DIAGNOSIS — I10 Essential (primary) hypertension: Secondary | ICD-10-CM

## 2024-12-12 NOTE — Progress Notes (Signed)
" ° °  12/12/2024 Name: Erin Cunningham MRN: [id number] DOB: 09/04/1941  Chief Complaint  Patient presents with   Medication Management    Hypertension    Erin Cunningham is a 84 y.o. year old female who presented for a telephone visit.   They were referred to the pharmacist by their PCP for assistance in managing medication adherence with blood pressure medications..    Subjective:  Erin Cunningham is an 84 year old female referred for hypertension management and medication adherence. She has a medical history significant for:  aortic stenosis, depression, allergic rhinitis, history of CVA, hypertension, hyperlipidemia, and afib.   Care Team: Primary Care Provider: Caro Harlene POUR, NP ; Next Scheduled Visit: 01/01/2025  Medication Access/Adherence  Current Pharmacy:  [id number] [place] [place], [place] - [address] AT [facility] [address] [place] [address] Phone: [phone] Fax: [phone]   Patient reports affordability concerns with their medications: Yes --Xarelto  -we are working on Patient assistance Patient reports access/transportation concerns to their pharmacy: No  Patient reports adherence concerns with their medications:  Yes  Atorvastatin  has not been filled since July. Patient said it was stopped and amlodipine  was started in it's place. Atorvastatin  is for cholesterol and amlodipine  is for hypertension. Atorvastatin  is still on her medication list.  She reported her blood pressure as 141/84 today (much improved).  She also said she has been taking all of her blood pressure medications as prescribed.      Objective:  Lab Results  Component Value Date   HGBA1C 5.6 05/29/2020    Lab Results  Component Value Date   CREATININE 0.91 07/28/2024   BUN 18 07/28/2024   NA 141 07/28/2024   K 4.5 07/28/2024   CL 109 07/28/2024   CO2 25 07/28/2024    Lab Results  Component Value Date   CHOL 149 01/24/2024   HDL 47  (L) 01/24/2024   LDLCALC 81 01/24/2024   TRIG 116 01/24/2024   CHOLHDL 3.2 01/24/2024   Patient reported her blood pressure as 141/84 today.   Assessment/Plan:  Continue current therapy. Follow up after [name]. Send Provider a delayed note just prior to the upcoming appointment about the Atorvastatin .   Cassius DOROTHA Brought, PharmD, BCACP Clinical Pharmacist [phone]    "

## 2024-12-12 NOTE — Progress Notes (Signed)
"  error  "

## 2024-12-27 NOTE — Telephone Encounter (Signed)
 Reached out to patient regarding PAP application for Xarelto  ([employer])  left HIPAA compliant v/m for assistance.

## 2025-01-01 ENCOUNTER — Ambulatory Visit: Admitting: Nurse Practitioner

## 2025-02-02 ENCOUNTER — Ambulatory Visit: Admitting: Nurse Practitioner
